# Patient Record
Sex: Male | Born: 1950
Health system: Southern US, Community
[De-identification: ages and names within clinical notes are randomized; demographics above are authoritative.]

## PROBLEM LIST (undated history)

## (undated) DIAGNOSIS — G4733 Obstructive sleep apnea (adult) (pediatric): Secondary | ICD-10-CM

## (undated) DIAGNOSIS — Z9989 Dependence on other enabling machines and devices: Secondary | ICD-10-CM

## (undated) DIAGNOSIS — I10 Essential (primary) hypertension: Secondary | ICD-10-CM

## (undated) DIAGNOSIS — E119 Type 2 diabetes mellitus without complications: Secondary | ICD-10-CM

## (undated) DIAGNOSIS — I96 Gangrene, not elsewhere classified: Secondary | ICD-10-CM

## (undated) DIAGNOSIS — I739 Peripheral vascular disease, unspecified: Secondary | ICD-10-CM

## (undated) HISTORY — PX: VASECTOMY: SHX75

## (undated) HISTORY — DX: Gangrene, not elsewhere classified: I96

## (undated) HISTORY — PX: COLONOSCOPY: SHX174

## (undated) HISTORY — DX: Essential (primary) hypertension: I10

## (undated) HISTORY — DX: Peripheral vascular disease, unspecified: I73.9

---

## 1998-11-17 HISTORY — PX: INGUINAL HERNIA REPAIR: SUR1180

## 2002-02-13 ENCOUNTER — Ambulatory Visit (HOSPITAL_BASED_OUTPATIENT_CLINIC_OR_DEPARTMENT_OTHER): Admission: RE | Admit: 2002-02-13 | Discharge: 2002-02-13 | Payer: Self-pay | Admitting: Family Medicine

## 2014-08-30 ENCOUNTER — Encounter: Payer: Self-pay | Admitting: Family Medicine

## 2014-10-05 ENCOUNTER — Encounter: Payer: Self-pay | Admitting: Family Medicine

## 2014-10-05 ENCOUNTER — Other Ambulatory Visit: Payer: Self-pay | Admitting: Family Medicine

## 2014-10-05 ENCOUNTER — Ambulatory Visit (INDEPENDENT_AMBULATORY_CARE_PROVIDER_SITE_OTHER): Payer: 59 | Admitting: Family Medicine

## 2014-10-05 VITALS — BP 150/90 | HR 79 | Ht 70.5 in | Wt 192.0 lb

## 2014-10-05 DIAGNOSIS — Z0000 Encounter for general adult medical examination without abnormal findings: Secondary | ICD-10-CM

## 2014-10-05 DIAGNOSIS — Z23 Encounter for immunization: Secondary | ICD-10-CM

## 2014-10-05 DIAGNOSIS — Z1211 Encounter for screening for malignant neoplasm of colon: Secondary | ICD-10-CM

## 2014-10-05 LAB — CBC WITH DIFFERENTIAL/PLATELET
Basophils Absolute: 0.1 10*3/uL (ref 0.0–0.1)
Basophils Relative: 1 % (ref 0–1)
Eosinophils Absolute: 0.2 10*3/uL (ref 0.0–0.7)
Eosinophils Relative: 2 % (ref 0–5)
HCT: 45.4 % (ref 39.0–52.0)
Hemoglobin: 16.5 g/dL (ref 13.0–17.0)
Lymphocytes Relative: 28 % (ref 12–46)
Lymphs Abs: 2.2 10*3/uL (ref 0.7–4.0)
MCH: 32.3 pg (ref 26.0–34.0)
MCHC: 36.3 g/dL — ABNORMAL HIGH (ref 30.0–36.0)
MCV: 88.8 fL (ref 78.0–100.0)
MONO ABS: 0.6 10*3/uL (ref 0.1–1.0)
MPV: 9.3 fL — ABNORMAL LOW (ref 9.4–12.4)
Monocytes Relative: 8 % (ref 3–12)
Neutro Abs: 4.8 10*3/uL (ref 1.7–7.7)
Neutrophils Relative %: 61 % (ref 43–77)
Platelets: 285 10*3/uL (ref 150–400)
RBC: 5.11 MIL/uL (ref 4.22–5.81)
RDW: 13.4 % (ref 11.5–15.5)
WBC: 7.8 10*3/uL (ref 4.0–10.5)

## 2014-10-05 LAB — COMPREHENSIVE METABOLIC PANEL
ALBUMIN: 4.6 g/dL (ref 3.5–5.2)
ALT: 28 U/L (ref 0–53)
AST: 22 U/L (ref 0–37)
Alkaline Phosphatase: 46 U/L (ref 39–117)
BUN: 16 mg/dL (ref 6–23)
CO2: 27 mEq/L (ref 19–32)
CREATININE: 0.86 mg/dL (ref 0.50–1.35)
Calcium: 9.6 mg/dL (ref 8.4–10.5)
Chloride: 104 mEq/L (ref 96–112)
Glucose, Bld: 111 mg/dL — ABNORMAL HIGH (ref 70–99)
POTASSIUM: 4.3 meq/L (ref 3.5–5.3)
Sodium: 140 mEq/L (ref 135–145)
Total Bilirubin: 0.7 mg/dL (ref 0.2–1.2)
Total Protein: 7 g/dL (ref 6.0–8.3)

## 2014-10-05 LAB — POCT URINALYSIS DIPSTICK
Bilirubin, UA: NEGATIVE
Blood, UA: NEGATIVE
Glucose, UA: NEGATIVE
Ketones, UA: NEGATIVE
LEUKOCYTES UA: NEGATIVE
NITRITE UA: NEGATIVE
PH UA: 7
PROTEIN UA: NEGATIVE
Spec Grav, UA: 1.015
Urobilinogen, UA: NEGATIVE

## 2014-10-05 LAB — LIPID PANEL
CHOL/HDL RATIO: 4.2 ratio
Cholesterol: 162 mg/dL (ref 0–200)
HDL: 39 mg/dL — AB (ref >39)
LDL Cholesterol: 108 mg/dL — ABNORMAL HIGH (ref 0–99)
Triglycerides: 77 mg/dL (ref <150)
VLDL: 15 mg/dL (ref 0–40)

## 2014-10-05 NOTE — Progress Notes (Signed)
   Subjective:    Patient ID: Adam [name], male    DOB: [birth year], 63 y.o.   MRN: [id number]  HPI He is here as a new patient for complete examination. In the past he had been followed by Dr. Deatra Ina. He has no particular concerns or complaints. His work and home life are going quite well. Family and social history was reviewed. Presently he is on no medications. His immunizations were also renewed.   Review of Systems  All other systems reviewed and are negative.      Objective:   Physical Exam Pulse 79  Ht 5' 10.5" (1.791 m)  Wt 192 lb (87.091 kg)  BMI 27.15 kg/m2  SpO2 98%  General Appearance:    Alert, cooperative, no distress, appears stated age  Head:    Normocephalic, without obvious abnormality, atraumatic  Eyes:    PERRL, conjunctiva/corneas clear, EOM's intact, fundi    benign  Ears:    Normal TM's and external ear canals  Nose:   Nares normal, mucosa normal, no drainage or sinus   tenderness  Throat:   Lips, mucosa, and tongue normal; teeth and gums normal  Neck:   Supple, no lymphadenopathy;  thyroid:  no   enlargement/tenderness/nodules; no carotid   bruit or JVD  Back:    Spine nontender, no curvature, ROM normal, no CVA     tenderness  Lungs:     Clear to auscultation bilaterally without wheezes, rales or     ronchi; respirations unlabored  Chest Wall:    No tenderness or deformity   Heart:    Regular rate and rhythm, S1 and S2 normal, no murmur, rub   or gallop  Breast Exam:    No chest wall tenderness, masses or gynecomastia  Abdomen:     Soft, non-tender, nondistended, normoactive bowel sounds,    no masses, no hepatosplenomegaly        Extremities:   No clubbing, cyanosis or edema  Pulses:   2+ and symmetric all extremities  Skin:   Skin color, texture, turgor normal, no rashes or lesions  Lymph nodes:   Cervical, supraclavicular, and axillary nodes normal  Neurologic:   CNII-XII intact, normal strength, sensation and gait; reflexes 2+ and symmetric  throughout          Psych:   Normal mood, affect, hygiene and grooming.          Assessment & Plan:  Routine general medical examination at a health care facility - Plan: CBC with Differential, Comprehensive metabolic panel, Lipid panel, POCT Urinalysis Dipstick  Special screening for malignant neoplasms, colon - Plan: Ambulatory referral to Gastroenterology  Need for Tdap vaccination - Plan: Tdap vaccine greater than or equal to 7yo IM  Need for Zostavax administration - Plan: Varicella-zoster vaccine subcutaneous  encouraged him to continue to take good care of himself. At a multivitamin to his regimen. His immunizations will be updated. Discussed flu shot but at this time he is going to hold off on that.

## 2014-10-06 LAB — HEMOGLOBIN A1C
Hgb A1c MFr Bld: 5.9 % — ABNORMAL HIGH (ref <5.7)
Mean Plasma Glucose: 123 mg/dL — ABNORMAL HIGH (ref <117)

## 2014-12-04 ENCOUNTER — Ambulatory Visit (INDEPENDENT_AMBULATORY_CARE_PROVIDER_SITE_OTHER): Payer: PRIVATE HEALTH INSURANCE | Admitting: Family Medicine

## 2014-12-04 VITALS — BP 200/110 | HR 66

## 2014-12-04 DIAGNOSIS — I10 Essential (primary) hypertension: Secondary | ICD-10-CM

## 2014-12-04 LAB — HM COLONOSCOPY

## 2014-12-04 MED ORDER — LISINOPRIL-HYDROCHLOROTHIAZIDE 10-12.5 MG PO TABS
1.0000 | ORAL_TABLET | Freq: Every day | ORAL | Status: DC
Start: 2014-12-04 — End: 2015-01-08

## 2014-12-04 NOTE — Patient Instructions (Signed)
If you develop a cough or have any swelling, let me know and stop the medicine

## 2014-12-04 NOTE — Progress Notes (Signed)
   Subjective:    Patient ID: Adam [name], male    DOB: 10-17-1951, 64 y.o.   MRN: [id number]  HPI He is here for consultation about his blood pressure. He was seen earlier today by Dr. Collene Mares and noted to have elevated blood pressure. He was sent here for further consult. Review his record indicates his last blood pressure was slightly elevated. He has no previous history of difficulty with it.   Review of Systems     Objective:   Physical Exam Alert and in no distress. Blood pressure is recorded.       Assessment & Plan:  Essential hypertension - Plan: lisinopril-hydrochlorothiazide (PRINZIDE,ZESTORETIC) 10-12.5 MG per tablet  he now has had 2 separate blood pressure readings that are elevated. I will place him on lisinopril/HCTZ. Discussed possible side effects of this medication. Also discussed the long-term benefit of antihypertensive medication reducing the risk of stroke, heart failure and renal failure. Recheck here in one month.

## 2015-01-08 ENCOUNTER — Ambulatory Visit (INDEPENDENT_AMBULATORY_CARE_PROVIDER_SITE_OTHER): Payer: PRIVATE HEALTH INSURANCE | Admitting: Family Medicine

## 2015-01-08 ENCOUNTER — Encounter: Payer: Self-pay | Admitting: Family Medicine

## 2015-01-08 VITALS — BP 150/100 | HR 68 | Ht 70.0 in | Wt 193.0 lb

## 2015-01-08 DIAGNOSIS — I10 Essential (primary) hypertension: Secondary | ICD-10-CM

## 2015-01-08 MED ORDER — LISINOPRIL-HYDROCHLOROTHIAZIDE 20-12.5 MG PO TABS
1.0000 | ORAL_TABLET | Freq: Every day | ORAL | Status: DC
Start: 2015-01-08 — End: 2015-03-04

## 2015-01-08 NOTE — Progress Notes (Signed)
   Subjective:    Patient ID: Adam [name], male    DOB: 11/18/1950, 64 y.o.   MRN: [id number]  HPI He is here for a recheck on his blood pressure. He is now taking lisinopril/HCTZ and having no difficulty with this. He is checking his blood at home. He did not bring those readings in.   Review of Systems     Objective:   Physical Exam Alert and in no distress otherwise not examined. Blood pressure is recorded.       Assessment & Plan:  Essential hypertension - Plan: lisinopril-hydrochlorothiazide (ZESTORETIC) 20-12.5 MG per tablet  I will increase his lisinopril. Discussed the fact that we might need to go with another medication if this does not lower his pressure. Also discussed increasing his physical activities. He is to bring in his blood pressure cuff and the readings to compare with ours.

## 2015-01-08 NOTE — Patient Instructions (Signed)
Bring her blood pressure cuff in with the readings and check your pressure in the sitting position and resting.

## 2015-02-05 ENCOUNTER — Ambulatory Visit: Payer: PRIVATE HEALTH INSURANCE | Admitting: Family Medicine

## 2015-03-04 ENCOUNTER — Other Ambulatory Visit: Payer: Self-pay | Admitting: Family Medicine

## 2015-04-24 ENCOUNTER — Ambulatory Visit (INDEPENDENT_AMBULATORY_CARE_PROVIDER_SITE_OTHER): Payer: PRIVATE HEALTH INSURANCE | Admitting: Family Medicine

## 2015-04-24 ENCOUNTER — Encounter: Payer: Self-pay | Admitting: Family Medicine

## 2015-04-24 VITALS — BP 140/84 | HR 73 | Wt 188.2 lb

## 2015-04-24 DIAGNOSIS — L989 Disorder of the skin and subcutaneous tissue, unspecified: Secondary | ICD-10-CM | POA: Diagnosis not present

## 2015-04-24 DIAGNOSIS — I10 Essential (primary) hypertension: Secondary | ICD-10-CM | POA: Diagnosis not present

## 2015-04-24 DIAGNOSIS — G4733 Obstructive sleep apnea (adult) (pediatric): Secondary | ICD-10-CM | POA: Diagnosis not present

## 2015-04-24 DIAGNOSIS — Z9989 Dependence on other enabling machines and devices: Principal | ICD-10-CM

## 2015-04-24 MED ORDER — LISINOPRIL-HYDROCHLOROTHIAZIDE 20-12.5 MG PO TABS: 1.0000 | ORAL_TABLET | Freq: Every day | ORAL | Status: DC

## 2015-04-24 NOTE — Progress Notes (Signed)
   Subjective:    Patient ID: Adam [name], male    DOB: [birth year], 64 y.o.   MRN: [id number]  HPI He is here for recheck. He did not go to the higher dosing of lisinopril. He has continued on the 10/12.5. He is here for recheck on that. He also has an underlying history of OSA and has been on CPAP for approximately 10 years.He also has a skin tag present on the left lateral upper eyelid as well as a lesion on his scalp on the right.   Review of Systems     Objective:   Physical Exam Alert and in no distress. Blood pressure is recorded.Skin tag noted. The lesion on the right is well demarcated and not pigmented.       Assessment & Plan:  OSA on CPAP  Essential hypertension - Plan: lisinopril-hydrochlorothiazide (PRINZIDE,ZESTORETIC) 20-12.5 MG per tablet  Scalp lesion - Plan: Ambulatory referral to Dermatology I explained that I would like his blood pressure a little bit lower and recommend he go to the 20/12.5. We'll also refer to dermatology to look at the scalp lesion and to remove the skin tag. Explained that I did not want to take off the skin tag since it is so close to the eye.

## 2015-11-22 ENCOUNTER — Encounter: Payer: Self-pay | Admitting: Family Medicine

## 2015-11-22 ENCOUNTER — Ambulatory Visit (INDEPENDENT_AMBULATORY_CARE_PROVIDER_SITE_OTHER): Payer: PRIVATE HEALTH INSURANCE | Admitting: Family Medicine

## 2015-11-22 ENCOUNTER — Other Ambulatory Visit: Payer: Self-pay

## 2015-11-22 VITALS — BP 162/92 | HR 70 | Ht 70.0 in | Wt 194.4 lb

## 2015-11-22 DIAGNOSIS — I96 Gangrene, not elsewhere classified: Secondary | ICD-10-CM

## 2015-11-22 DIAGNOSIS — M79674 Pain in right toe(s): Secondary | ICD-10-CM | POA: Diagnosis not present

## 2015-11-22 DIAGNOSIS — R0989 Other specified symptoms and signs involving the circulatory and respiratory systems: Secondary | ICD-10-CM

## 2015-11-22 DIAGNOSIS — E119 Type 2 diabetes mellitus without complications: Secondary | ICD-10-CM | POA: Diagnosis not present

## 2015-11-22 HISTORY — DX: Gangrene, not elsewhere classified: I96

## 2015-11-22 LAB — CBC WITH DIFFERENTIAL/PLATELET
BASOS ABS: 0.1 10*3/uL (ref 0.0–0.1)
BASOS PCT: 1 % (ref 0–1)
Eosinophils Absolute: 0.3 10*3/uL (ref 0.0–0.7)
Eosinophils Relative: 3 % (ref 0–5)
HCT: 44.9 % (ref 39.0–52.0)
HEMOGLOBIN: 15.5 g/dL (ref 13.0–17.0)
Lymphocytes Relative: 26 % (ref 12–46)
Lymphs Abs: 2.3 10*3/uL (ref 0.7–4.0)
MCH: 31.3 pg (ref 26.0–34.0)
MCHC: 34.5 g/dL (ref 30.0–36.0)
MCV: 90.7 fL (ref 78.0–100.0)
MPV: 9.4 fL (ref 8.6–12.4)
Monocytes Absolute: 0.8 10*3/uL (ref 0.1–1.0)
Monocytes Relative: 9 % (ref 3–12)
NEUTROS ABS: 5.3 10*3/uL (ref 1.7–7.7)
NEUTROS PCT: 61 % (ref 43–77)
Platelets: 289 10*3/uL (ref 150–400)
RBC: 4.95 MIL/uL (ref 4.22–5.81)
RDW: 14 % (ref 11.5–15.5)
WBC: 8.7 10*3/uL (ref 4.0–10.5)

## 2015-11-22 LAB — POCT GLYCOSYLATED HEMOGLOBIN (HGB A1C): HEMOGLOBIN A1C: 7.1

## 2015-11-22 MED ORDER — TRAMADOL HCL 50 MG PO TABS
50.0000 mg | ORAL_TABLET | Freq: Three times a day (TID) | ORAL | Status: DC | PRN
Start: 2015-11-22 — End: 2015-12-29

## 2015-11-22 NOTE — Patient Instructions (Signed)
Take two Aleve twice a day, then use the Tramadol for pain

## 2015-11-22 NOTE — Progress Notes (Signed)
   Subjective:    Patient ID: Adam [name], male    DOB: 12/19/1950, 65 y.o.   MRN: [id number]  HPI  He is here for evaluation of a one-month month history of difficulty with right great toe pain and swelling. In the last week these symptoms have gotten much worse. He has no history of trauma. He has had no recent procedures. Review of record indicates an elevated A1c of 5.9.   Review of Systems     Objective:   Physical Exam  exam of the right great toe does show the nail has sent from the bed. The toe is quite purple. There is fairly good capillary refill however pulses were not palpable. It is tender to palpation. No joint tenderness.  hemoglobin A1c is now 7.1      Assessment & Plan:  New onset type 2 diabetes mellitus (Adam [name]) - Plan: POCT glycosylated hemoglobin (Hb A1C), CBC with Differential/Platelet, Comprehensive metabolic panel, Lipid panel  Great toe pain, right - Plan: traMADol (ULTRAM) 50 MG tablet  I have concerns over infection of the toe versus vascular compromise. He does now have a new onset diabetes diagnosis and will return here in one week for follow-up on this. I personally set him up for a visit with Dr. Sharol Given to further evaluate and treat his great toe. Case was also discussed with Dr. Tomi Bamberger.

## 2015-11-23 ENCOUNTER — Encounter: Payer: Self-pay | Admitting: Vascular Surgery

## 2015-11-23 ENCOUNTER — Ambulatory Visit (INDEPENDENT_AMBULATORY_CARE_PROVIDER_SITE_OTHER)
Admission: RE | Admit: 2015-11-23 | Discharge: 2015-11-23 | Disposition: A | Discharge status: Home / Self Care | Payer: No Typology Code available for payment source | Source: Ambulatory Visit | Attending: Vascular Surgery | Admitting: Vascular Surgery

## 2015-11-23 ENCOUNTER — Other Ambulatory Visit: Payer: Self-pay

## 2015-11-23 ENCOUNTER — Other Ambulatory Visit: Payer: Self-pay | Admitting: Vascular Surgery

## 2015-11-23 ENCOUNTER — Ambulatory Visit (HOSPITAL_COMMUNITY)
Admission: RE | Admit: 2015-11-23 | Discharge: 2015-11-23 | Disposition: A | Discharge status: Home / Self Care | Payer: No Typology Code available for payment source | Source: Ambulatory Visit | Attending: Vascular Surgery | Admitting: Vascular Surgery

## 2015-11-23 ENCOUNTER — Ambulatory Visit (INDEPENDENT_AMBULATORY_CARE_PROVIDER_SITE_OTHER): Payer: No Typology Code available for payment source | Admitting: Vascular Surgery

## 2015-11-23 VITALS — BP 210/91 | HR 89 | Temp 98.9°F | Resp 16 | Ht 71.0 in | Wt 194.0 lb

## 2015-11-23 DIAGNOSIS — E119 Type 2 diabetes mellitus without complications: Secondary | ICD-10-CM | POA: Insufficient documentation

## 2015-11-23 DIAGNOSIS — I70261 Atherosclerosis of native arteries of extremities with gangrene, right leg: Secondary | ICD-10-CM | POA: Diagnosis not present

## 2015-11-23 DIAGNOSIS — I96 Gangrene, not elsewhere classified: Secondary | ICD-10-CM

## 2015-11-23 DIAGNOSIS — I771 Stricture of artery: Secondary | ICD-10-CM | POA: Insufficient documentation

## 2015-11-23 DIAGNOSIS — I10 Essential (primary) hypertension: Secondary | ICD-10-CM | POA: Insufficient documentation

## 2015-11-23 DIAGNOSIS — I70269 Atherosclerosis of native arteries of extremities with gangrene, unspecified extremity: Secondary | ICD-10-CM | POA: Insufficient documentation

## 2015-11-23 DIAGNOSIS — R0989 Other specified symptoms and signs involving the circulatory and respiratory systems: Secondary | ICD-10-CM

## 2015-11-23 LAB — COMPREHENSIVE METABOLIC PANEL
ALT: 25 U/L (ref 9–46)
AST: 22 U/L (ref 10–35)
Albumin: 4.6 g/dL (ref 3.6–5.1)
Alkaline Phosphatase: 41 U/L (ref 40–115)
BUN: 22 mg/dL (ref 7–25)
CO2: 27 mmol/L (ref 20–31)
Calcium: 10.1 mg/dL (ref 8.6–10.3)
Chloride: 103 mmol/L (ref 98–110)
Creat: 0.82 mg/dL (ref 0.70–1.25)
GLUCOSE: 108 mg/dL — AB (ref 65–99)
POTASSIUM: 4.3 mmol/L (ref 3.5–5.3)
SODIUM: 140 mmol/L (ref 135–146)
TOTAL PROTEIN: 7.1 g/dL (ref 6.1–8.1)
Total Bilirubin: 0.5 mg/dL (ref 0.2–1.2)

## 2015-11-23 LAB — LIPID PANEL
CHOL/HDL RATIO: 3 ratio (ref <=5.0)
Cholesterol: 152 mg/dL (ref 125–200)
HDL: 51 mg/dL (ref >=40)
LDL Cholesterol: 90 mg/dL (ref <130)
Triglycerides: 55 mg/dL (ref <150)
VLDL: 11 mg/dL (ref <30)

## 2015-11-23 MED ORDER — OXYCODONE-ACETAMINOPHEN 5-325 MG PO TABS: 1.0000 | ORAL_TABLET | Freq: Four times a day (QID) | ORAL | Status: DC | PRN

## 2015-11-23 NOTE — Progress Notes (Signed)
Referred by:  Denita Lung, MD [address] [place], [place] [address]  Reason for referral: Right great toe ischemia  History of Present Illness  Adam [name] is a 65 y.o. (05-17-1951) male who presents with chief complaint: painful black right great toe.  Onset of symptom occurred over last 3 weeks, increasing pain in R great toe with darkening color.  The was seen by Dr. Sharol Given yesterday and he had concerns that he had an ischemic toe due PAD.   Pain is described as achy, severity 10/10, and associated with rest and movement.  Patient has attempted to treat this pain with Tramadol without relief.  The patient has intermittentrest pain symptoms.  He denies any significant intermittent claudication.  He denies any drainage or fever or chills.  Atherosclerotic risk factors include: DM, HTN, and prior smoking.  Past Medical History  Diagnosis Date  . Diabetes mellitus without complication ([place])   . Hypertension   . Peripheral vascular disease ([place])   . Gangrene ([place]) 11/22/2015    right great toe    Past Surgical History  Procedure Laterality Date  . Hernia repair  2000    Social History   Social History  . Marital Status: Married    Spouse Name: N/A  . Number of Children: N/A  . Years of Education: N/A   Occupational History  . Not on file.   Social History Main Topics  . Smoking status: Former Smoker    Quit date: 10/06/2011  . Smokeless tobacco: Never Used  . Alcohol Use: 4.2 oz/week    7 Glasses of wine, 0 Standard drinks or equivalent per week  . Drug Use: No  . Sexual Activity: Yes   Other Topics Concern  . Not on file   Social History Narrative    Family History  Problem Relation Age of Onset  . Hypertension Mother     Current Outpatient Prescriptions  Medication Sig Dispense Refill  . lisinopril-hydrochlorothiazide (PRINZIDE,ZESTORETIC) 20-12.5 MG per tablet Take 1 tablet by mouth daily. 90 tablet 3  . traMADol (ULTRAM) 50 MG tablet Take 1  tablet (50 mg total) by mouth every 8 (eight) hours as needed. 30 tablet 0  . oxyCODONE-acetaminophen (PERCOCET/ROXICET) 5-325 MG tablet Take 1-2 tablets by mouth every 6 (six) hours as needed for moderate pain. 30 tablet 0   No current facility-administered medications for this visit.    No Known Allergies   REVIEW OF SYSTEMS:  (Positives checked otherwise negative)  CARDIOVASCULAR:   [ ]  chest pain,  [ ]  chest pressure,  [ ]  palpitations,  [ ]  shortness of breath when laying flat,  [ ]  shortness of breath with exertion,   [x]  pain in feet when walking,  [x]  pain in feet when laying flat, [ ]  history of blood clot in veins (DVT),  [ ]  history of phlebitis,  [ ]  swelling in legs,  [ ]  varicose veins  PULMONARY:   [ ]  productive cough,  [ ]  asthma,  [ ]  wheezing  NEUROLOGIC:   [ ]  weakness in arms or legs,  [ ]  numbness in arms or legs,  [ ]  difficulty speaking or slurred speech,  [ ]  temporary loss of vision in one eye,  [ ]  dizziness  HEMATOLOGIC:   [ ]  bleeding problems,  [ ]  problems with blood clotting too easily  MUSCULOSKEL:   [ ]  joint pain, [ ]  joint swelling  GASTROINTEST:   [ ]  vomiting blood,  [ ]   blood in stool     GENITOURINARY:   [ ]  burning with urination,  [ ]  blood in urine  PSYCHIATRIC:   [ ]  history of major depression  INTEGUMENTARY:   [ ]  rashes,  [ ]  ulcers  CONSTITUTIONAL:   [ ]  fever,  [ ]  chills   For VQI Use Only  PRE-ADM LIVING: Home  AMB STATUS: Ambulatory  CAD Sx: None  PRIOR CHF: None  STRESS TEST: [x]  No, [ ]  Normal, [ ]  + ischemia, [ ]  + MI, [ ]  Both   Physical Examination  Filed Vitals:   11/23/15 1613 11/23/15 1619  BP: 199/82 210/91  Pulse: 75 89  Temp: 98.9 F (37.2 C)   TempSrc: Oral   Resp: 16   Height: 5\' 11"  (1.803 m)   Weight: 194 lb (87.998 kg)   SpO2: 98%    Body mass index is 27.07 kg/(m^2).  General: A&O x 3, WDWN  Head: [place]/AT  Ear/Nose/Throat: Hearing grossly intact, nares  w/o erythema or drainage, oropharynx w/o Erythema/Exudate, Mallampati score: 3  Eyes: PERRLA, EOMI  Neck: Supple, no nuchal rigidity, no palpable LAD  Pulmonary: Sym exp, good air movt, CTAB, no rales, rhonchi, & wheezing  Cardiac: RRR, Nl S1, S2, no Murmurs, rubs or gallops  Vascular: Vessel Right Left  Radial Palpable Palpable  Brachial Palpable Palpable  Carotid Palpable, without bruit Palpable, without bruit  Aorta Not palpable N/A  Femoral Palpable Palpable  Popliteal Not palpable Not palpable  PT Not Palpable Not Palpable  DP Not Palpable Not Palpable   Gastrointestinal: soft, NTND, -G/R, - HSM, - masses, - CVAT B  Musculoskeletal: M/S 5/5 throughout , Extremities without ischemic changes except  R great toe appears ischemic with black tip  Neurologic: CN 2-12 intact , Pain and light touch intact in extremities including R great toe, Motor exam as listed above  Psychiatric: Judgment intact, Mood & affect appropriate for pt's clinical situation  Dermatologic: See M/S exam for extremity exam, no rashes otherwise noted  Lymph : No Cervical, Axillary, or Inguinal lymphadenopathy    Non-Invasive Vascular Imaging  ABI (Date: 11/23/2015)  R: 0.49, AT: mono, PT: mono, TBI: not obtained  L: 0.71, DP: mono, PT: mono, TBI: 0.38  RLE arterial duplex (11/23/2015)  Triphasic flow in CFA and PFA  Occluded SFA  Monophasic flow in Pop and tibial arteries  Outside Studies/Documentation 2 pages of outside documents were reviewed including: outpatient ortho chart.   Medical Decision Making  Adam [name] is a 65 y.o. male who presents with: RLE critical limb ischemia, LLE moderate PAD   I discussed with the patient the natural history of critical limb ischemia: 25% require amputation in one year, 50% are able to maintain their limbs in one year, and 25-30% die in one year due to comorbidities.  Given the limb threatening status of this patient, I recommend an aggressive  work up including proceeding with an: Aortogram, Bilateral runoff and possible intervention RLE I discussed with the patient the nature of angiographic procedures, especially the limited patencies of any endovascular intervention. The patient is aware of that the risks of an angiographic procedure include but are not limited to: bleeding, infection, access site complications, embolization, rupture of treated vessel, dissection, possible need for emergent surgical intervention, and possible need for surgical procedures to treat the patient's pathology. The patient is aware of the risks and agrees to proceed.  The procedure is scheduled for: 9 JAN 17.  I discussed in depth  with the patient the nature of atherosclerosis, and emphasized the importance of maximal medical management including strict control of blood pressure, blood glucose, and lipid levels, antiplatelet agents, obtaining regular exercise, and cessation of smoking.  The patient is aware that without maximal medical management the underlying atherosclerotic disease process will progress, limiting the benefit of any interventions. The patient is currently not on a statin.  The patient will have a lipid panel drawn to evaluate prior to any surgical intervention. The patient is currently on an anti-platelet.  The patient will be started on ASA 81 mg PO daily after his angiographic procedure. I suspect this patient is not going to be an endovascular candidate as a flush SFA occlusion is seen on duplex.  He will likely need ASAP surgical revascularization and R great toe amputation.  Thank you for allowing [place] to participate in this patient's care.   Adele Barthel, MD [facility] Office: [phone] Pager: [phone]  11/23/2015, 4:38 PM

## 2015-11-26 ENCOUNTER — Telehealth: Payer: Self-pay | Admitting: Family Medicine

## 2015-11-26 ENCOUNTER — Encounter (HOSPITAL_COMMUNITY)
Admission: AD | Disposition: A | Discharge status: Home / Self Care | Payer: Self-pay | Source: Ambulatory Visit | Attending: Vascular Surgery

## 2015-11-26 ENCOUNTER — Ambulatory Visit (HOSPITAL_BASED_OUTPATIENT_CLINIC_OR_DEPARTMENT_OTHER)
Admission: AD | Admit: 2015-11-26 | Discharge: 2015-11-26 | Disposition: A | Discharge status: Home / Self Care | Payer: Medicare Other | Source: Ambulatory Visit | Attending: Vascular Surgery | Admitting: Vascular Surgery

## 2015-11-26 ENCOUNTER — Other Ambulatory Visit: Payer: Self-pay

## 2015-11-26 ENCOUNTER — Ambulatory Visit (HOSPITAL_COMMUNITY)
Admission: AD | Admit: 2015-11-26 | Discharge: 2015-11-26 | Disposition: A | Discharge status: Home / Self Care | Payer: Medicare Other | Source: Ambulatory Visit | Attending: Vascular Surgery | Admitting: Vascular Surgery

## 2015-11-26 DIAGNOSIS — Z87891 Personal history of nicotine dependence: Secondary | ICD-10-CM | POA: Insufficient documentation

## 2015-11-26 DIAGNOSIS — L97519 Non-pressure chronic ulcer of other part of right foot with unspecified severity: Secondary | ICD-10-CM | POA: Diagnosis present

## 2015-11-26 DIAGNOSIS — I8001 Phlebitis and thrombophlebitis of superficial vessels of right lower extremity: Secondary | ICD-10-CM | POA: Diagnosis present

## 2015-11-26 DIAGNOSIS — I70269 Atherosclerosis of native arteries of extremities with gangrene, unspecified extremity: Secondary | ICD-10-CM

## 2015-11-26 DIAGNOSIS — I70261 Atherosclerosis of native arteries of extremities with gangrene, right leg: Principal | ICD-10-CM | POA: Diagnosis present

## 2015-11-26 DIAGNOSIS — I10 Essential (primary) hypertension: Secondary | ICD-10-CM | POA: Diagnosis present

## 2015-11-26 DIAGNOSIS — Z7982 Long term (current) use of aspirin: Secondary | ICD-10-CM

## 2015-11-26 DIAGNOSIS — I7025 Atherosclerosis of native arteries of other extremities with ulceration: Secondary | ICD-10-CM

## 2015-11-26 DIAGNOSIS — I701 Atherosclerosis of renal artery: Secondary | ICD-10-CM | POA: Diagnosis present

## 2015-11-26 DIAGNOSIS — I743 Embolism and thrombosis of arteries of the lower extremities: Secondary | ICD-10-CM | POA: Diagnosis present

## 2015-11-26 DIAGNOSIS — Z8249 Family history of ischemic heart disease and other diseases of the circulatory system: Secondary | ICD-10-CM | POA: Insufficient documentation

## 2015-11-26 DIAGNOSIS — E1152 Type 2 diabetes mellitus with diabetic peripheral angiopathy with gangrene: Secondary | ICD-10-CM | POA: Diagnosis present

## 2015-11-26 DIAGNOSIS — Z79899 Other long term (current) drug therapy: Secondary | ICD-10-CM

## 2015-11-26 DIAGNOSIS — I70235 Atherosclerosis of native arteries of right leg with ulceration of other part of foot: Secondary | ICD-10-CM | POA: Diagnosis not present

## 2015-11-26 HISTORY — PX: PERIPHERAL VASCULAR CATHETERIZATION: SHX172C

## 2015-11-26 LAB — CBC
HCT: 45.2 % (ref 39.0–52.0)
Hemoglobin: 15.2 g/dL (ref 13.0–17.0)
MCH: 31.3 pg (ref 26.0–34.0)
MCHC: 33.6 g/dL (ref 30.0–36.0)
MCV: 93.2 fL (ref 78.0–100.0)
PLATELETS: 239 10*3/uL (ref 150–400)
RBC: 4.85 MIL/uL (ref 4.22–5.81)
RDW: 12.9 % (ref 11.5–15.5)
WBC: 8.5 10*3/uL (ref 4.0–10.5)

## 2015-11-26 LAB — COMPREHENSIVE METABOLIC PANEL
ALBUMIN: 3.7 g/dL (ref 3.5–5.0)
ALT: 18 U/L (ref 17–63)
AST: 25 U/L (ref 15–41)
Alkaline Phosphatase: 36 U/L — ABNORMAL LOW (ref 38–126)
Anion gap: 10 (ref 5–15)
BUN: 20 mg/dL (ref 6–20)
CHLORIDE: 105 mmol/L (ref 101–111)
CO2: 25 mmol/L (ref 22–32)
CREATININE: 0.8 mg/dL (ref 0.61–1.24)
Calcium: 9.3 mg/dL (ref 8.9–10.3)
GFR calc Af Amer: >60 mL/min (ref >60)
GLUCOSE: 119 mg/dL — AB (ref 65–99)
Potassium: 3.9 mmol/L (ref 3.5–5.1)
SODIUM: 140 mmol/L (ref 135–145)
Total Bilirubin: 0.9 mg/dL (ref 0.3–1.2)
Total Protein: 6 g/dL — ABNORMAL LOW (ref 6.5–8.1)

## 2015-11-26 LAB — TYPE AND SCREEN
ABO/RH(D): A POS
Antibody Screen: NEGATIVE

## 2015-11-26 LAB — POCT I-STAT, CHEM 8
BUN: 28 mg/dL — AB (ref 6–20)
CREATININE: 0.9 mg/dL (ref 0.61–1.24)
Calcium, Ion: 1.21 mmol/L (ref 1.13–1.30)
Chloride: 100 mmol/L — ABNORMAL LOW (ref 101–111)
Glucose, Bld: 118 mg/dL — ABNORMAL HIGH (ref 65–99)
HEMATOCRIT: 48 % (ref 39.0–52.0)
Hemoglobin: 16.3 g/dL (ref 13.0–17.0)
Potassium: 3.7 mmol/L (ref 3.5–5.1)
Sodium: 140 mmol/L (ref 135–145)
TCO2: 30 mmol/L (ref 0–100)

## 2015-11-26 LAB — URINE MICROSCOPIC-ADD ON: WBC UA: NONE SEEN WBC/hpf (ref 0–5)

## 2015-11-26 LAB — URINALYSIS, ROUTINE W REFLEX MICROSCOPIC
BILIRUBIN URINE: NEGATIVE
Glucose, UA: NEGATIVE mg/dL
KETONES UR: 15 mg/dL — AB
LEUKOCYTES UA: NEGATIVE
NITRITE: NEGATIVE
PROTEIN: NEGATIVE mg/dL
Specific Gravity, Urine: >1.046 — ABNORMAL HIGH (ref 1.005–1.030)
pH: 7.5 (ref 5.0–8.0)

## 2015-11-26 LAB — PROTIME-INR
INR: 1.13 (ref 0.00–1.49)
Prothrombin Time: 14.7 seconds (ref 11.6–15.2)

## 2015-11-26 LAB — ABO/RH: ABO/RH(D): A POS

## 2015-11-26 LAB — APTT: aPTT: 27 seconds (ref 24–37)

## 2015-11-26 SURGERY — ABDOMINAL AORTOGRAM
Anesthesia: LOCAL

## 2015-11-26 MED ORDER — OXYCODONE-ACETAMINOPHEN 5-325 MG PO TABS
ORAL_TABLET | ORAL | Status: AC
Start: 2015-11-26 — End: 2015-11-26
  Administered 2015-11-26: 2 via ORAL
  Filled 2015-11-26: qty 2

## 2015-11-26 MED ORDER — IODIXANOL 320 MG/ML IV SOLN
INTRAVENOUS | Status: DC | PRN
  Administered 2015-11-26: 143 mL via INTRA_ARTERIAL

## 2015-11-26 MED ORDER — OXYCODONE-ACETAMINOPHEN 5-325 MG PO TABS
1.0000 | ORAL_TABLET | ORAL | Status: DC | PRN
  Administered 2015-11-26: 2 via ORAL

## 2015-11-26 MED ORDER — SODIUM CHLORIDE 0.9 % IV SOLN: 1.0000 mL/kg/h | INTRAVENOUS | Status: DC

## 2015-11-26 MED ORDER — LIDOCAINE HCL (PF) 1 % IJ SOLN
INTRAMUSCULAR | Status: DC | PRN
  Administered 2015-11-26: 08:00:00

## 2015-11-26 MED ORDER — HYDRALAZINE HCL 20 MG/ML IJ SOLN
INTRAMUSCULAR | Status: AC
  Filled 2015-11-26: qty 1

## 2015-11-26 MED ORDER — FENTANYL CITRATE (PF) 100 MCG/2ML IJ SOLN
INTRAMUSCULAR | Status: DC | PRN
  Administered 2015-11-26: 50 ug via INTRAVENOUS

## 2015-11-26 MED ORDER — HYDRALAZINE HCL 20 MG/ML IJ SOLN
INTRAMUSCULAR | Status: DC | PRN
  Administered 2015-11-26: 10 mg via INTRAVENOUS

## 2015-11-26 MED ORDER — MIDAZOLAM HCL 2 MG/2ML IJ SOLN
INTRAMUSCULAR | Status: DC | PRN
  Administered 2015-11-26: 1 mg via INTRAVENOUS

## 2015-11-26 MED ORDER — SODIUM CHLORIDE 0.9 % IV SOLN
INTRAVENOUS | Status: DC
  Administered 2015-11-26: 07:00:00 via INTRAVENOUS

## 2015-11-26 MED ORDER — FENTANYL CITRATE (PF) 100 MCG/2ML IJ SOLN
INTRAMUSCULAR | Status: AC
  Filled 2015-11-26: qty 2

## 2015-11-26 MED ORDER — MIDAZOLAM HCL 2 MG/2ML IJ SOLN
INTRAMUSCULAR | Status: AC
  Filled 2015-11-26: qty 2

## 2015-11-26 MED ORDER — HEPARIN (PORCINE) IN NACL 2-0.9 UNIT/ML-% IJ SOLN
INTRAMUSCULAR | Status: AC
  Filled 2015-11-26: qty 1000

## 2015-11-26 MED ORDER — LIDOCAINE HCL (PF) 1 % IJ SOLN
INTRAMUSCULAR | Status: AC
  Filled 2015-11-26: qty 30

## 2015-11-26 SURGICAL SUPPLY — 13 items
CATH ANGIO 5F PIGTAIL 65CM (CATHETERS) ×1 IMPLANT
CATH CROSS OVER TEMPO 5F (CATHETERS) ×1 IMPLANT
CATH STRAIGHT 5FR 65CM (CATHETERS) ×1 IMPLANT
COVER PRB 48X5XTLSCP FOLD TPE (BAG) IMPLANT
COVER PROBE 5X48 (BAG) ×2
GUIDEWIRE ANGLED .035X150CM (WIRE) ×1 IMPLANT
KIT MICROINTRODUCER STIFF 5F (SHEATH) ×1 IMPLANT
KIT PV (KITS) ×2 IMPLANT
SHEATH PINNACLE 5F 10CM (SHEATH) ×1 IMPLANT
SYR MEDRAD MARK V 150ML (SYRINGE) ×2 IMPLANT
TRANSDUCER W/STOPCOCK (MISCELLANEOUS) ×2 IMPLANT
TRAY PV CATH (CUSTOM PROCEDURE TRAY) ×2 IMPLANT
WIRE HITORQ VERSACORE ST 145CM (WIRE) ×1 IMPLANT

## 2015-11-26 NOTE — Interval H&P Note (Signed)
History and Physical Interval Note:  11/26/2015 7:37 AM  Adam [name]  has presented today for surgery, with the diagnosis of pvd with right great toe ulcer  The various methods of treatment have been discussed with the patient and family. After consideration of risks, benefits and other options for treatment, the patient has consented to  Procedure(s): Abdominal Aortogram (N/A) as a surgical intervention .  The patient's history has been reviewed, patient examined, no change in status, stable for surgery.  I have reviewed the patient's chart and labs.  Questions were answered to the patient's satisfaction.     Deitra Mayo

## 2015-11-26 NOTE — Op Note (Signed)
   PATIENT: Adam [name]   MRN: [id number] DOB: 1951/06/04    DATE OF PROCEDURE: 11/26/2015  INDICATIONS: OREN HERINGTON is a 65 y.o. male the presented with a nonhealing wound of his right great toe. He had evidence of infrainguinal arterial occlusive disease on exam and presents for arteriography.  PROCEDURE:  1. Ultrasound-guided access to the left common femoral artery 2. Aortogram with bilateral iliac arteriogram 3. Selective catheterization of the right external iliac artery with right lower extremity runoff 4. Retrograde left femoral arteriogram with left lower extremity runoff  SURGEON: Judeth Cornfield. Scot Dock, MD, FACS  ANESTHESIA: Gen.   EBL: minimal  TECHNIQUE: The patient was taken to the peripheral vascular lab and received 1 mg of Versed and 50 g of fentanyl.  Both groins were prepped and draped in usual sterile fashion. Under ultrasound guidance, after the skin was anesthetized, the left common femoral artery was cannulated with a micropuncture needle and a micropuncture sheath was introduced over the wire. This was exchanged for a 5 French sheath over a versa core wire. The pigtail catheter was positioned at the L1 vertebral body of flush aortogram obtained. The catheter was positioned above the aortic bifurcation and an oblique iliac projection was obtained. Next the catheter was exchanged for a crossover catheter which was positioned into the proximal right common iliac artery. An angled Glidewire was advanced into the external iliac artery and the crossover catheter exchanged for a straight catheter. Selective right external iliac arteriogram was obtained with right lower extremity runoff. A lateral projection of the foot was obtained. Next the straight catheter was removed and a retrograde left femoral arteriogram obtained with left lower extremity runoff. Given the possibility of embolization given the pattern of disease, I advanced the pigtail catheter is high was it reach  into the descending thoracic aorta and a aortogram was obtained and the decision thoracic aorta which did not show any evidence of a potential source for embolization. At the completion of the procedure, the sheath was flushed. The patient was transferred to the holding area for removal of the sheath. No media complications were noted.  FINDINGS:  1. The descending thoracic aorta suprarenal aorta and infrarenal aorta are widely patent. There are single renal arteries bilaterally. There was a fairly smooth 50% left renal artery stenosis. 2. On the right side, the common iliac, external iliac, hypogastric, common femoral, and deep femoral arteries are patent. The superficial femoral artery on the right is occluded at its origin with reconstitution of the palpatory artery at the level of the knee. There is two-vessel runoff on the right via the anterior tibial and peroneal arteries which are abruptly occluded in the distal third of the leg. There is some reconstitution of the right dorsalis pedis artery. The posterior tibial artery on the right is occluded. 3. On the left side, the common iliac, external iliac, hypogastric, common femoral artery, deep femoral artery, and proximal superficial femoral artery are patent. The superficial femoral artery is occluded at the adductor canal with reconstitution of the posterior tibial artery. There is some reconstitution also of a diseased anterior tibial artery which occludes in the mid leg.  Deitra Mayo, MD, FACS [facility]  DATE OF DICTATION:   11/26/2015

## 2015-11-26 NOTE — H&P (View-Only) (Signed)
Referred by:  Denita Lung, MD [address] [place], [place] [address]  Reason for referral: Right great toe ischemia  History of Present Illness  Adam [name] is a 65 y.o. ([birth year]) male who presents with chief complaint: painful black right great toe.  Onset of symptom occurred over last 3 weeks, increasing pain in R great toe with darkening color.  The was seen by Dr. Sharol Given yesterday and he had concerns that he had an ischemic toe due PAD.   Pain is described as achy, severity 10/10, and associated with rest and movement.  Patient has attempted to treat this pain with Tramadol without relief.  The patient has intermittentrest pain symptoms.  He denies any significant intermittent claudication.  He denies any drainage or fever or chills.  Atherosclerotic risk factors include: DM, HTN, and prior smoking.  Past Medical History  Diagnosis Date  . Diabetes mellitus without complication ([place])   . Hypertension   . Peripheral vascular disease ([place])   . Gangrene ([place]) 11/22/2015    right great toe    Past Surgical History  Procedure Laterality Date  . Hernia repair  2000    Social History   Social History  . Marital Status: Married    Spouse Name: N/A  . Number of Children: N/A  . Years of Education: N/A   Occupational History  . Not on file.   Social History Main Topics  . Smoking status: Former Smoker    Quit date: 10/06/2011  . Smokeless tobacco: Never Used  . Alcohol Use: 4.2 oz/week    7 Glasses of wine, 0 Standard drinks or equivalent per week  . Drug Use: No  . Sexual Activity: Yes   Other Topics Concern  . Not on file   Social History Narrative    Family History  Problem Relation Age of Onset  . Hypertension Mother     Current Outpatient Prescriptions  Medication Sig Dispense Refill  . lisinopril-hydrochlorothiazide (PRINZIDE,ZESTORETIC) 20-12.5 MG per tablet Take 1 tablet by mouth daily. 90 tablet 3  . traMADol (ULTRAM) 50 MG tablet Take 1  tablet (50 mg total) by mouth every 8 (eight) hours as needed. 30 tablet 0  . oxyCODONE-acetaminophen (PERCOCET/ROXICET) 5-325 MG tablet Take 1-2 tablets by mouth every 6 (six) hours as needed for moderate pain. 30 tablet 0   No current facility-administered medications for this visit.    No Known Allergies   REVIEW OF SYSTEMS:  (Positives checked otherwise negative)  CARDIOVASCULAR:   [ ]  chest pain,  [ ]  chest pressure,  [ ]  palpitations,  [ ]  shortness of breath when laying flat,  [ ]  shortness of breath with exertion,   [x]  pain in feet when walking,  [x]  pain in feet when laying flat, [ ]  history of blood clot in veins (DVT),  [ ]  history of phlebitis,  [ ]  swelling in legs,  [ ]  varicose veins  PULMONARY:   [ ]  productive cough,  [ ]  asthma,  [ ]  wheezing  NEUROLOGIC:   [ ]  weakness in arms or legs,  [ ]  numbness in arms or legs,  [ ]  difficulty speaking or slurred speech,  [ ]  temporary loss of vision in one eye,  [ ]  dizziness  HEMATOLOGIC:   [ ]  bleeding problems,  [ ]  problems with blood clotting too easily  MUSCULOSKEL:   [ ]  joint pain, [ ]  joint swelling  GASTROINTEST:   [ ]  vomiting blood,  [ ]   blood in stool     GENITOURINARY:   [ ]  burning with urination,  [ ]  blood in urine  PSYCHIATRIC:   [ ]  history of major depression  INTEGUMENTARY:   [ ]  rashes,  [ ]  ulcers  CONSTITUTIONAL:   [ ]  fever,  [ ]  chills   For VQI Use Only  PRE-ADM LIVING: Home  AMB STATUS: Ambulatory  CAD Sx: None  PRIOR CHF: None  STRESS TEST: [x]  No, [ ]  Normal, [ ]  + ischemia, [ ]  + MI, [ ]  Both   Physical Examination  Filed Vitals:   11/23/15 1613 11/23/15 1619  BP: 199/82 210/91  Pulse: 75 89  Temp: 98.9 F (37.2 C)   TempSrc: Oral   Resp: 16   Height: 5\' 11"  (1.803 m)   Weight: 194 lb (87.998 kg)   SpO2: 98%    Body mass index is 27.07 kg/(m^2).  General: A&O x 3, WDWN  Head: [place]/AT  Ear/Nose/Throat: Hearing grossly intact, nares  w/o erythema or drainage, oropharynx w/o Erythema/Exudate, Mallampati score: 3  Eyes: PERRLA, EOMI  Neck: Supple, no nuchal rigidity, no palpable LAD  Pulmonary: Sym exp, good air movt, CTAB, no rales, rhonchi, & wheezing  Cardiac: RRR, Nl S1, S2, no Murmurs, rubs or gallops  Vascular: Vessel Right Left  Radial Palpable Palpable  Brachial Palpable Palpable  Carotid Palpable, without bruit Palpable, without bruit  Aorta Not palpable N/A  Femoral Palpable Palpable  Popliteal Not palpable Not palpable  PT Not Palpable Not Palpable  DP Not Palpable Not Palpable   Gastrointestinal: soft, NTND, -G/R, - HSM, - masses, - CVAT B  Musculoskeletal: M/S 5/5 throughout , Extremities without ischemic changes except  R great toe appears ischemic with black tip  Neurologic: CN 2-12 intact , Pain and light touch intact in extremities including R great toe, Motor exam as listed above  Psychiatric: Judgment intact, Mood & affect appropriate for pt's clinical situation  Dermatologic: See M/S exam for extremity exam, no rashes otherwise noted  Lymph : No Cervical, Axillary, or Inguinal lymphadenopathy    Non-Invasive Vascular Imaging  ABI (Date: 11/23/2015)  R: 0.49, AT: mono, PT: mono, TBI: not obtained  L: 0.71, DP: mono, PT: mono, TBI: 0.38  RLE arterial duplex (11/23/2015)  Triphasic flow in CFA and PFA  Occluded SFA  Monophasic flow in Pop and tibial arteries  Outside Studies/Documentation 2 pages of outside documents were reviewed including: outpatient ortho chart.   Medical Decision Making  Adam [name] is a 65 y.o. male who presents with: RLE critical limb ischemia, LLE moderate PAD   I discussed with the patient the natural history of critical limb ischemia: 25% require amputation in one year, 50% are able to maintain their limbs in one year, and 25-30% die in one year due to comorbidities.  Given the limb threatening status of this patient, I recommend an aggressive  work up including proceeding with an: Aortogram, Bilateral runoff and possible intervention RLE I discussed with the patient the nature of angiographic procedures, especially the limited patencies of any endovascular intervention. The patient is aware of that the risks of an angiographic procedure include but are not limited to: bleeding, infection, access site complications, embolization, rupture of treated vessel, dissection, possible need for emergent surgical intervention, and possible need for surgical procedures to treat the patient's pathology. The patient is aware of the risks and agrees to proceed.  The procedure is scheduled for: 9 JAN 17.  I discussed in depth  with the patient the nature of atherosclerosis, and emphasized the importance of maximal medical management including strict control of blood pressure, blood glucose, and lipid levels, antiplatelet agents, obtaining regular exercise, and cessation of smoking.  The patient is aware that without maximal medical management the underlying atherosclerotic disease process will progress, limiting the benefit of any interventions. The patient is currently not on a statin.  The patient will have a lipid panel drawn to evaluate prior to any surgical intervention. The patient is currently on an anti-platelet.  The patient will be started on ASA 81 mg PO daily after his angiographic procedure. I suspect this patient is not going to be an endovascular candidate as a flush SFA occlusion is seen on duplex.  He will likely need ASAP surgical revascularization and R great toe amputation.  Thank you for allowing [place] to participate in this patient's care.   Adele Barthel, MD [facility] Office: [phone] Pager: [phone]  11/23/2015, 4:38 PM

## 2015-11-26 NOTE — Telephone Encounter (Signed)
Pt is going to have surgery wed so had to cancel appt wed, said if you would like you could call Nicole or joanne and talk to them if needed. [name] called and just wanted to let you know

## 2015-11-26 NOTE — Progress Notes (Signed)
Lower Extremity Vein Map    Right Great Saphenous Vein   Segment Diameter Comment  1. Origin 4.107mm   2. High Thigh 2.36mm   3. Mid Thigh 3.5mm Chronic thrombus, wall thickening, branch  4. Low Thigh 4.82mm Wall thickening  5. At Knee 3.22mm   6. High Calf 2.2mm branch  7. Low Calf 2.48mm branch  8. Ankle 2.33mm    mm    mm    mm     Right Small Saphenous Vein  Segment Diameter Comment  1. Origin mm   2. High Calf 0.41mm   3. Low Calf 1.32mm   4. Ankle mm Too small   mm    mm    mm     Left Great Saphenous Vein   Segment Diameter Comment  1. Origin 5.22mm   2. High Thigh 3.10mm   3. Mid Thigh 3.37mm Chronic thrombus, wall thickening  4. Low Thigh 2.69mm branch  5. At Knee 2.80mm   6. High Calf 2.104mm branch  7. Low Calf 2.18mm   8. Ankle 2.48mm    mm    mm    mm     Left Small Saphenous Vein  Segment Diameter Comment  1. Origin 3.92mm   2. High Calf 0.9mm branch  3. Low Calf mm Too small  4. Ankle mm    mm    mm    mm     Landry Mellow, RDMS, RVT 11/26/2015

## 2015-11-26 NOTE — Discharge Instructions (Signed)
Angiogram, Care After °Refer to this sheet in the next few weeks. These instructions provide you with information about caring for yourself after your procedure. Your health care provider may also give you more specific instructions. Your treatment has been planned according to current medical practices, but problems sometimes occur. Call your health care provider if you have any problems or questions after your procedure. °WHAT TO EXPECT AFTER THE PROCEDURE °After your procedure, it is typical to have the following: °· Bruising at the catheter insertion site that usually fades within 1-2 weeks. °· Blood collecting in the tissue (hematoma) that may be painful to the touch. It should usually decrease in size and tenderness within 1-2 weeks. °HOME CARE INSTRUCTIONS °· Take medicines only as directed by your health care provider. °· You may shower 24-48 hours after the procedure or as directed by your health care provider. Remove the bandage (dressing) and gently wash the site with plain soap and water. Pat the area dry with a clean towel. Do not rub the site, because this may cause bleeding. °· Do not take baths, swim, or use a hot tub until your health care provider approves. °· Check your insertion site every day for redness, swelling, or drainage. °· Do not apply powder or lotion to the site. °· Do not lift over 10 lb (4.5 kg) for 5 days after your procedure or as directed by your health care provider. °· Ask your health care provider when it is okay to: °¨ Return to work or school. °¨ Resume usual physical activities or sports. °¨ Resume sexual activity. °· Do not drive home if you are discharged the same day as the procedure. Have someone else drive you. °· You may drive 24 hours after the procedure unless otherwise instructed by your health care provider. °· Do not operate machinery or power tools for 24 hours after the procedure or as directed by your health care provider. °· If your procedure was done as an  outpatient procedure, which means that you went home the same day as your procedure, a responsible adult should be with you for the first 24 hours after you arrive home. °· Keep all follow-up visits as directed by your health care provider. This is important. °SEEK MEDICAL CARE IF: °· You have a fever. °· You have chills. °· You have increased bleeding from the catheter insertion site. Hold pressure on the site. °SEEK IMMEDIATE MEDICAL CARE IF: °· You have unusual pain at the catheter insertion site. °· You have redness, warmth, or swelling at the catheter insertion site. °· You have drainage (other than a small amount of blood on the dressing) from the catheter insertion site. °· The catheter insertion site is bleeding, and the bleeding does not stop after 30 minutes of holding steady pressure on the site. °· The area near or just beyond the catheter insertion site becomes pale, cool, tingly, or numb. °  °This information is not intended to replace advice given to you by your health care provider. Make sure you discuss any questions you have with your health care provider. °  °Document Released: 05/22/2005 Document Revised: 11/24/2014 Document Reviewed: 04/06/2013 °Elsevier Interactive Patient Education ©2016 [employer] ° °

## 2015-11-27 ENCOUNTER — Encounter (HOSPITAL_COMMUNITY): Payer: Self-pay | Admitting: Vascular Surgery

## 2015-11-27 LAB — HEMOGLOBIN A1C
HEMOGLOBIN A1C: 5.7 % — AB (ref 4.8–5.6)
Mean Plasma Glucose: 117 mg/dL

## 2015-11-27 MED ORDER — CHLORHEXIDINE GLUCONATE CLOTH 2 % EX PADS: 6.0000 | MEDICATED_PAD | Freq: Once | CUTANEOUS | Status: DC

## 2015-11-27 MED ORDER — DEXTROSE 5 % IV SOLN
1.5000 g | INTRAVENOUS | Status: AC
  Administered 2015-11-28: 1.5 g via INTRAVENOUS
  Filled 2015-11-27: qty 1.5

## 2015-11-27 MED ORDER — SODIUM CHLORIDE 0.9 % IV SOLN: INTRAVENOUS | Status: DC

## 2015-11-27 MED ORDER — MUPIROCIN 2 % EX OINT
1.0000 "application " | TOPICAL_OINTMENT | Freq: Once | CUTANEOUS | Status: DC
  Filled 2015-11-27: qty 22

## 2015-11-27 NOTE — Anesthesia Preprocedure Evaluation (Addendum)
Anesthesia Evaluation  Patient identified by MRN, date of birth, ID band Patient awake    Reviewed: Allergy & Precautions, NPO status , Patient's Chart, lab work & pertinent test results  Airway Mallampati: II  TM Distance: >3 FB Neck ROM: Full    Dental  (+) Partial Upper, Partial Lower   Pulmonary sleep apnea , former smoker,    breath sounds clear to auscultation       Cardiovascular hypertension, Pt. on medications + Peripheral Vascular Disease   Rhythm:Regular Rate:Normal     Neuro/Psych negative neurological ROS  negative psych ROS   GI/Hepatic negative GI ROS, Neg liver ROS,   Endo/Other  diabetes, Type 2  Renal/GU negative Renal ROS  negative genitourinary   Musculoskeletal negative musculoskeletal ROS (+)   Abdominal   Peds negative pediatric ROS (+)  Hematology negative hematology ROS (+)   Anesthesia Other Findings   Reproductive/Obstetrics negative OB ROS                            Lab Results  Component Value Date   WBC 8.5 11/26/2015   HGB 15.2 11/26/2015   HCT 45.2 11/26/2015   MCV 93.2 11/26/2015   PLT 239 11/26/2015   Lab Results  Component Value Date   CREATININE 0.80 11/26/2015   BUN 20 11/26/2015   NA 140 11/26/2015   K 3.9 11/26/2015   CL 105 11/26/2015   CO2 25 11/26/2015   Lab Results  Component Value Date   INR 1.13 11/26/2015   11/2015 EKG: NSR with PAC's noted.   Anesthesia Physical Anesthesia Plan  ASA: II  Anesthesia Plan: General   Post-op Pain Management:    Induction: Intravenous  Airway Management Planned: Oral ETT  Additional Equipment: Arterial line  Intra-op Plan:   Post-operative Plan: Extubation in OR  Informed Consent: I have reviewed the patients History and Physical, chart, labs and discussed the procedure including the risks, benefits and alternatives for the proposed anesthesia with the patient or authorized  representative who has indicated his/her understanding and acceptance.   Dental advisory given  Plan Discussed with: CRNA  Anesthesia Plan Comments:         Anesthesia Quick Evaluation

## 2015-11-27 NOTE — Progress Notes (Signed)
Pt denies cardiac history, chest pain or sob. Recently diagnosed with diabetes, A1C was 5.7 yesterday.

## 2015-11-28 ENCOUNTER — Ambulatory Visit: Payer: PRIVATE HEALTH INSURANCE | Admitting: Family Medicine

## 2015-11-28 ENCOUNTER — Inpatient Hospital Stay (HOSPITAL_COMMUNITY): Payer: Medicare Other | Admitting: Certified Registered Nurse Anesthetist

## 2015-11-28 ENCOUNTER — Inpatient Hospital Stay (HOSPITAL_COMMUNITY)
Admission: AD | Admit: 2015-11-28 | Discharge: 2015-12-03 | DRG: 253 | Disposition: A | Discharge status: Home Health | Payer: Medicare Other | Source: Ambulatory Visit | Attending: Vascular Surgery | Admitting: Vascular Surgery

## 2015-11-28 ENCOUNTER — Encounter (HOSPITAL_COMMUNITY)
Admission: AD | Disposition: A | Discharge status: Home Health | Payer: Self-pay | Source: Ambulatory Visit | Attending: Vascular Surgery

## 2015-11-28 DIAGNOSIS — I70261 Atherosclerosis of native arteries of extremities with gangrene, right leg: Secondary | ICD-10-CM | POA: Diagnosis not present

## 2015-11-28 DIAGNOSIS — Z7982 Long term (current) use of aspirin: Secondary | ICD-10-CM | POA: Diagnosis not present

## 2015-11-28 DIAGNOSIS — I70249 Atherosclerosis of native arteries of left leg with ulceration of unspecified site: Secondary | ICD-10-CM | POA: Diagnosis present

## 2015-11-28 DIAGNOSIS — I701 Atherosclerosis of renal artery: Secondary | ICD-10-CM | POA: Diagnosis present

## 2015-11-28 DIAGNOSIS — I10 Essential (primary) hypertension: Secondary | ICD-10-CM | POA: Diagnosis present

## 2015-11-28 DIAGNOSIS — Z87891 Personal history of nicotine dependence: Secondary | ICD-10-CM | POA: Diagnosis not present

## 2015-11-28 DIAGNOSIS — I743 Embolism and thrombosis of arteries of the lower extremities: Secondary | ICD-10-CM | POA: Diagnosis present

## 2015-11-28 DIAGNOSIS — Z79899 Other long term (current) drug therapy: Secondary | ICD-10-CM | POA: Diagnosis not present

## 2015-11-28 DIAGNOSIS — M79674 Pain in right toe(s): Secondary | ICD-10-CM | POA: Diagnosis not present

## 2015-11-28 DIAGNOSIS — I8001 Phlebitis and thrombophlebitis of superficial vessels of right lower extremity: Secondary | ICD-10-CM | POA: Diagnosis present

## 2015-11-28 DIAGNOSIS — L97519 Non-pressure chronic ulcer of other part of right foot with unspecified severity: Secondary | ICD-10-CM | POA: Diagnosis present

## 2015-11-28 DIAGNOSIS — I7025 Atherosclerosis of native arteries of other extremities with ulceration: Secondary | ICD-10-CM | POA: Diagnosis not present

## 2015-11-28 DIAGNOSIS — Z419 Encounter for procedure for purposes other than remedying health state, unspecified: Secondary | ICD-10-CM

## 2015-11-28 DIAGNOSIS — E1152 Type 2 diabetes mellitus with diabetic peripheral angiopathy with gangrene: Secondary | ICD-10-CM | POA: Diagnosis present

## 2015-11-28 HISTORY — PX: FEMORAL-POPLITEAL BYPASS GRAFT: SHX937

## 2015-11-28 LAB — SURGICAL PCR SCREEN
MRSA, PCR: NEGATIVE
Staphylococcus aureus: POSITIVE — AB

## 2015-11-28 LAB — GLUCOSE, CAPILLARY
GLUCOSE-CAPILLARY: 83 mg/dL (ref 65–99)
GLUCOSE-CAPILLARY: 117 mg/dL — AB (ref 65–99)
Glucose-Capillary: 93 mg/dL (ref 65–99)

## 2015-11-28 SURGERY — BYPASS GRAFT FEMORAL-POPLITEAL ARTERY
Anesthesia: General | Site: Leg Upper | Laterality: Right

## 2015-11-28 MED ORDER — EPHEDRINE SULFATE 50 MG/ML IJ SOLN
INTRAMUSCULAR | Status: AC
  Filled 2015-11-28: qty 1

## 2015-11-28 MED ORDER — PHENOL 1.4 % MT LIQD: 1.0000 | OROMUCOSAL | Status: DC | PRN

## 2015-11-28 MED ORDER — ONDANSETRON HCL 4 MG/2ML IJ SOLN
4.0000 mg | Freq: Four times a day (QID) | INTRAMUSCULAR | Status: DC | PRN
Start: 2015-11-28 — End: 2015-12-03

## 2015-11-28 MED ORDER — HYDROMORPHONE HCL 1 MG/ML IJ SOLN
INTRAMUSCULAR | Status: AC
  Filled 2015-11-28: qty 1

## 2015-11-28 MED ORDER — FENTANYL CITRATE (PF) 250 MCG/5ML IJ SOLN
INTRAMUSCULAR | Status: AC
  Filled 2015-11-28: qty 5

## 2015-11-28 MED ORDER — LISINOPRIL 10 MG PO TABS
20.0000 mg | ORAL_TABLET | Freq: Every day | ORAL | Status: DC
  Administered 2015-11-28 – 2015-12-03 (×6): 20 mg via ORAL
  Filled 2015-11-28: qty 1
  Filled 2015-11-28 (×2): qty 2
  Filled 2015-11-28: qty 1
  Filled 2015-11-28 (×3): qty 2

## 2015-11-28 MED ORDER — ALUM & MAG HYDROXIDE-SIMETH 200-200-20 MG/5ML PO SUSP: 15.0000 mL | ORAL | Status: DC | PRN

## 2015-11-28 MED ORDER — PHENYLEPHRINE HCL 10 MG/ML IJ SOLN
INTRAMUSCULAR | Status: DC | PRN
  Administered 2015-11-28: 80 ug via INTRAVENOUS

## 2015-11-28 MED ORDER — SUCCINYLCHOLINE CHLORIDE 20 MG/ML IJ SOLN
INTRAMUSCULAR | Status: AC
  Filled 2015-11-28: qty 1

## 2015-11-28 MED ORDER — ROCURONIUM BROMIDE 100 MG/10ML IV SOLN
INTRAVENOUS | Status: DC | PRN
Start: 2015-11-28 — End: 2015-11-28
  Administered 2015-11-28: 10 mg via INTRAVENOUS
  Administered 2015-11-28: 40 mg via INTRAVENOUS
  Administered 2015-11-28 (×2): 20 mg via INTRAVENOUS
  Administered 2015-11-28 (×2): 10 mg via INTRAVENOUS
  Administered 2015-11-28: 50 mg via INTRAVENOUS

## 2015-11-28 MED ORDER — ROCURONIUM BROMIDE 50 MG/5ML IV SOLN
INTRAVENOUS | Status: AC
  Filled 2015-11-28: qty 2

## 2015-11-28 MED ORDER — ENOXAPARIN SODIUM 40 MG/0.4ML ~~LOC~~ SOLN
40.0000 mg | SUBCUTANEOUS | Status: DC
  Administered 2015-11-29 – 2015-12-03 (×4): 40 mg via SUBCUTANEOUS
  Filled 2015-11-28 (×4): qty 0.4

## 2015-11-28 MED ORDER — SODIUM CHLORIDE 0.9 % IV SOLN: 500.0000 mL | Freq: Once | INTRAVENOUS | Status: DC | PRN

## 2015-11-28 MED ORDER — OXYCODONE-ACETAMINOPHEN 5-325 MG PO TABS
1.0000 | ORAL_TABLET | ORAL | Status: DC | PRN
  Administered 2015-11-28 – 2015-12-03 (×26): 2 via ORAL
  Filled 2015-11-28 (×24): qty 2

## 2015-11-28 MED ORDER — PROTAMINE SULFATE 10 MG/ML IV SOLN
INTRAVENOUS | Status: DC | PRN
  Administered 2015-11-28 (×4): 10 mg via INTRAVENOUS

## 2015-11-28 MED ORDER — GUAIFENESIN-DM 100-10 MG/5ML PO SYRP: 15.0000 mL | ORAL_SOLUTION | ORAL | Status: DC | PRN

## 2015-11-28 MED ORDER — LABETALOL HCL 5 MG/ML IV SOLN
10.0000 mg | INTRAVENOUS | Status: DC | PRN
  Administered 2015-11-30: 10 mg via INTRAVENOUS
  Filled 2015-11-28: qty 4

## 2015-11-28 MED ORDER — PROPOFOL 10 MG/ML IV BOLUS
INTRAVENOUS | Status: DC | PRN
  Administered 2015-11-28: 150 mg via INTRAVENOUS
  Administered 2015-11-28: 50 mg via INTRAVENOUS

## 2015-11-28 MED ORDER — HYDRALAZINE HCL 20 MG/ML IJ SOLN
5.0000 mg | INTRAMUSCULAR | Status: DC | PRN
  Administered 2015-11-28: 5 mg via INTRAVENOUS

## 2015-11-28 MED ORDER — DEXAMETHASONE SODIUM PHOSPHATE 4 MG/ML IJ SOLN
INTRAMUSCULAR | Status: AC
  Filled 2015-11-28: qty 1

## 2015-11-28 MED ORDER — LIDOCAINE HCL (CARDIAC) 20 MG/ML IV SOLN
INTRAVENOUS | Status: AC
  Filled 2015-11-28: qty 5

## 2015-11-28 MED ORDER — HEMOSTATIC AGENTS (NO CHARGE) OPTIME
TOPICAL | Status: DC | PRN
  Administered 2015-11-28: 1 via TOPICAL

## 2015-11-28 MED ORDER — MORPHINE SULFATE (PF) 2 MG/ML IV SOLN
INTRAVENOUS | Status: AC
  Filled 2015-11-28: qty 1

## 2015-11-28 MED ORDER — ONDANSETRON HCL 4 MG/2ML IJ SOLN
INTRAMUSCULAR | Status: AC
  Filled 2015-11-28: qty 2

## 2015-11-28 MED ORDER — MEPERIDINE HCL 25 MG/ML IJ SOLN: 6.2500 mg | INTRAMUSCULAR | Status: DC | PRN

## 2015-11-28 MED ORDER — FENTANYL CITRATE (PF) 100 MCG/2ML IJ SOLN
INTRAMUSCULAR | Status: DC | PRN
  Administered 2015-11-28 (×4): 50 ug via INTRAVENOUS

## 2015-11-28 MED ORDER — MIDAZOLAM HCL 5 MG/5ML IJ SOLN
INTRAMUSCULAR | Status: DC | PRN
  Administered 2015-11-28: 2 mg via INTRAVENOUS

## 2015-11-28 MED ORDER — HYDRALAZINE HCL 20 MG/ML IJ SOLN
INTRAMUSCULAR | Status: AC
Start: 2015-11-28 — End: 2015-11-29
  Filled 2015-11-28: qty 1

## 2015-11-28 MED ORDER — ACETAMINOPHEN 325 MG PO TABS
325.0000 mg | ORAL_TABLET | ORAL | Status: DC | PRN
  Administered 2015-11-29: 650 mg via ORAL
  Filled 2015-11-28: qty 2

## 2015-11-28 MED ORDER — DOCUSATE SODIUM 100 MG PO CAPS
100.0000 mg | ORAL_CAPSULE | Freq: Every day | ORAL | Status: DC
  Administered 2015-11-29 – 2015-12-03 (×5): 100 mg via ORAL
  Filled 2015-11-28 (×5): qty 1

## 2015-11-28 MED ORDER — HYDROCHLOROTHIAZIDE 12.5 MG PO CAPS
12.5000 mg | ORAL_CAPSULE | Freq: Every day | ORAL | Status: DC
  Administered 2015-11-28 – 2015-12-03 (×6): 12.5 mg via ORAL
  Filled 2015-11-28 (×6): qty 1

## 2015-11-28 MED ORDER — SUGAMMADEX SODIUM 200 MG/2ML IV SOLN
INTRAVENOUS | Status: AC
  Filled 2015-11-28: qty 2

## 2015-11-28 MED ORDER — OXYCODONE-ACETAMINOPHEN 5-325 MG PO TABS
ORAL_TABLET | ORAL | Status: AC
  Filled 2015-11-28: qty 2

## 2015-11-28 MED ORDER — HEPARIN SODIUM (PORCINE) 5000 UNIT/ML IJ SOLN
INTRAMUSCULAR | Status: DC | PRN
  Administered 2015-11-28: 500 mL

## 2015-11-28 MED ORDER — MORPHINE SULFATE (PF) 4 MG/ML IV SOLN
INTRAVENOUS | Status: AC
  Filled 2015-11-28: qty 1

## 2015-11-28 MED ORDER — PANTOPRAZOLE SODIUM 40 MG PO TBEC
40.0000 mg | DELAYED_RELEASE_TABLET | Freq: Every day | ORAL | Status: DC
  Administered 2015-11-28 – 2015-12-03 (×6): 40 mg via ORAL
  Filled 2015-11-28 (×6): qty 1

## 2015-11-28 MED ORDER — CEFUROXIME SODIUM 1.5 G IJ SOLR
1.5000 g | Freq: Two times a day (BID) | INTRAMUSCULAR | Status: AC
  Administered 2015-11-28 – 2015-11-29 (×2): 1.5 g via INTRAVENOUS
  Filled 2015-11-28 (×2): qty 1.5

## 2015-11-28 MED ORDER — ONDANSETRON HCL 4 MG/2ML IJ SOLN
INTRAMUSCULAR | Status: DC | PRN
  Administered 2015-11-28: 4 mg via INTRAVENOUS

## 2015-11-28 MED ORDER — HYDROMORPHONE HCL 1 MG/ML IJ SOLN
0.2500 mg | INTRAMUSCULAR | Status: DC | PRN
  Administered 2015-11-28 (×4): 0.5 mg via INTRAVENOUS

## 2015-11-28 MED ORDER — THROMBIN 20000 UNITS EX SOLR
CUTANEOUS | Status: AC
  Filled 2015-11-28: qty 20000

## 2015-11-28 MED ORDER — MIDAZOLAM HCL 2 MG/2ML IJ SOLN
INTRAMUSCULAR | Status: AC
  Filled 2015-11-28: qty 2

## 2015-11-28 MED ORDER — LACTATED RINGERS IV SOLN
INTRAVENOUS | Status: DC | PRN
  Administered 2015-11-28: 09:00:00 via INTRAVENOUS

## 2015-11-28 MED ORDER — ROCURONIUM BROMIDE 50 MG/5ML IV SOLN
INTRAVENOUS | Status: AC
  Filled 2015-11-28: qty 1

## 2015-11-28 MED ORDER — GLYCOPYRROLATE 0.2 MG/ML IJ SOLN
INTRAMUSCULAR | Status: AC
  Filled 2015-11-28: qty 1

## 2015-11-28 MED ORDER — METOPROLOL TARTRATE 1 MG/ML IV SOLN: 2.0000 mg | INTRAVENOUS | Status: DC | PRN

## 2015-11-28 MED ORDER — DEXAMETHASONE SODIUM PHOSPHATE 4 MG/ML IJ SOLN
INTRAMUSCULAR | Status: DC | PRN
  Administered 2015-11-28: 4 mg via INTRAVENOUS

## 2015-11-28 MED ORDER — LACTATED RINGERS IV SOLN
INTRAVENOUS | Status: DC | PRN
  Administered 2015-11-28 (×3): via INTRAVENOUS

## 2015-11-28 MED ORDER — SODIUM CHLORIDE 0.9 % IJ SOLN
INTRAMUSCULAR | Status: AC
  Filled 2015-11-28: qty 10

## 2015-11-28 MED ORDER — MAGNESIUM SULFATE 2 GM/50ML IV SOLN
2.0000 g | Freq: Every day | INTRAVENOUS | Status: DC | PRN
  Filled 2015-11-28: qty 50

## 2015-11-28 MED ORDER — PHENYLEPHRINE 40 MCG/ML (10ML) SYRINGE FOR IV PUSH (FOR BLOOD PRESSURE SUPPORT)
PREFILLED_SYRINGE | INTRAVENOUS | Status: AC
  Filled 2015-11-28: qty 10

## 2015-11-28 MED ORDER — POTASSIUM CHLORIDE CRYS ER 20 MEQ PO TBCR: 20.0000 meq | EXTENDED_RELEASE_TABLET | Freq: Every day | ORAL | Status: DC | PRN

## 2015-11-28 MED ORDER — SUGAMMADEX SODIUM 200 MG/2ML IV SOLN
INTRAVENOUS | Status: DC | PRN
  Administered 2015-11-28: 200 mg via INTRAVENOUS

## 2015-11-28 MED ORDER — LACTATED RINGERS IV SOLN: INTRAVENOUS | Status: DC

## 2015-11-28 MED ORDER — PROPOFOL 10 MG/ML IV BOLUS
INTRAVENOUS | Status: AC
  Filled 2015-11-28: qty 40

## 2015-11-28 MED ORDER — HEPARIN SODIUM (PORCINE) 1000 UNIT/ML IJ SOLN
INTRAMUSCULAR | Status: AC
  Filled 2015-11-28: qty 1

## 2015-11-28 MED ORDER — ACETAMINOPHEN 650 MG RE SUPP: 325.0000 mg | RECTAL | Status: DC | PRN

## 2015-11-28 MED ORDER — PROMETHAZINE HCL 25 MG/ML IJ SOLN: 6.2500 mg | INTRAMUSCULAR | Status: DC | PRN

## 2015-11-28 MED ORDER — SODIUM CHLORIDE 0.45 % IV SOLN
INTRAVENOUS | Status: DC
  Administered 2015-11-28 – 2015-11-30 (×2): via INTRAVENOUS

## 2015-11-28 MED ORDER — LISINOPRIL-HYDROCHLOROTHIAZIDE 20-12.5 MG PO TABS: 1.0000 | ORAL_TABLET | Freq: Every day | ORAL | Status: DC

## 2015-11-28 MED ORDER — EPHEDRINE SULFATE 50 MG/ML IJ SOLN
INTRAMUSCULAR | Status: DC | PRN
  Administered 2015-11-28: 10 mg via INTRAVENOUS

## 2015-11-28 MED ORDER — LIDOCAINE HCL (CARDIAC) 20 MG/ML IV SOLN
INTRAVENOUS | Status: DC | PRN
  Administered 2015-11-28: 60 mg via INTRAVENOUS

## 2015-11-28 MED ORDER — MORPHINE SULFATE (PF) 2 MG/ML IV SOLN
2.0000 mg | INTRAVENOUS | Status: DC | PRN
  Administered 2015-11-28 – 2015-12-01 (×12): 2 mg via INTRAVENOUS
  Administered 2015-12-01: 4 mg via INTRAVENOUS
  Administered 2015-12-01: 2 mg via INTRAVENOUS
  Administered 2015-12-02 (×2): 4 mg via INTRAVENOUS
  Administered 2015-12-03: 2 mg via INTRAVENOUS
  Filled 2015-11-28 (×3): qty 1
  Filled 2015-11-28: qty 2
  Filled 2015-11-28 (×2): qty 1
  Filled 2015-11-28: qty 2
  Filled 2015-11-28 (×5): qty 1
  Filled 2015-11-28: qty 2

## 2015-11-28 MED ORDER — 0.9 % SODIUM CHLORIDE (POUR BTL) OPTIME
TOPICAL | Status: DC | PRN
  Administered 2015-11-28: 2000 mL

## 2015-11-28 MED ORDER — HEPARIN SODIUM (PORCINE) 1000 UNIT/ML IJ SOLN
INTRAMUSCULAR | Status: DC | PRN
  Administered 2015-11-28: 8000 [IU] via INTRAVENOUS
  Administered 2015-11-28: 2000 [IU] via INTRAVENOUS

## 2015-11-28 MED ORDER — PHENYLEPHRINE HCL 10 MG/ML IJ SOLN
10.0000 mg | INTRAVENOUS | Status: DC | PRN
  Administered 2015-11-28: 20 ug/min via INTRAVENOUS

## 2015-11-28 SURGICAL SUPPLY — 70 items
AGENT HMST SPONGE THK3/8 (HEMOSTASIS) ×1
BANDAGE ELASTIC 4 VELCRO ST LF (GAUZE/BANDAGES/DRESSINGS) IMPLANT
BANDAGE ESMARK 6X9 LF (GAUZE/BANDAGES/DRESSINGS) IMPLANT
BLADE CLIPPER SURG (BLADE) ×1 IMPLANT
BNDG CMPR 9X6 STRL LF SNTH (GAUZE/BANDAGES/DRESSINGS) ×1
BNDG CONFORM 2 STRL LF (GAUZE/BANDAGES/DRESSINGS) ×1 IMPLANT
BNDG ESMARK 6X9 LF (GAUZE/BANDAGES/DRESSINGS) ×2
CANISTER SUCTION 2500CC (MISCELLANEOUS) ×2 IMPLANT
CLIP TI MEDIUM 24 (CLIP) ×2 IMPLANT
CLIP TI WIDE RED SMALL 24 (CLIP) ×2 IMPLANT
COVER PROBE W GEL 5X96 (DRAPES) ×3 IMPLANT
CUFF TOURNIQUET SINGLE 24IN (TOURNIQUET CUFF) IMPLANT
CUFF TOURNIQUET SINGLE 34IN LL (TOURNIQUET CUFF) ×1 IMPLANT
CUFF TOURNIQUET SINGLE 44IN (TOURNIQUET CUFF) IMPLANT
DRAIN CHANNEL 15F RND FF W/TCR (WOUND CARE) IMPLANT
DRAPE C-ARM 42X72 X-RAY (DRAPES) ×2 IMPLANT
DRAPE PROXIMA HALF (DRAPES) IMPLANT
DRSG COVADERM 4X10 (GAUZE/BANDAGES/DRESSINGS) IMPLANT
DRSG COVADERM 4X8 (GAUZE/BANDAGES/DRESSINGS) IMPLANT
ELECT CAUTERY BLADE 6.4 (BLADE) ×1 IMPLANT
ELECT REM PT RETURN 9FT ADLT (ELECTROSURGICAL) ×2
ELECTRODE REM PT RTRN 9FT ADLT (ELECTROSURGICAL) ×1 IMPLANT
EVACUATOR SILICONE 100CC (DRAIN) IMPLANT
GLOVE BIO SURGEON STRL SZ 6.5 (GLOVE) ×2 IMPLANT
GLOVE BIO SURGEON STRL SZ7 (GLOVE) ×3 IMPLANT
GLOVE BIOGEL PI IND STRL 6.5 (GLOVE) IMPLANT
GLOVE BIOGEL PI IND STRL 7.0 (GLOVE) IMPLANT
GLOVE BIOGEL PI IND STRL 7.5 (GLOVE) ×1 IMPLANT
GLOVE BIOGEL PI INDICATOR 6.5 (GLOVE) ×6
GLOVE BIOGEL PI INDICATOR 7.0 (GLOVE) ×1
GLOVE BIOGEL PI INDICATOR 7.5 (GLOVE) ×1
GLOVE SURG SS PI 6.0 STRL IVOR (GLOVE) ×2 IMPLANT
GLOVE SURG SS PI 7.0 STRL IVOR (GLOVE) ×1 IMPLANT
GOWN STRL REUS W/ TWL LRG LVL3 (GOWN DISPOSABLE) ×3 IMPLANT
GOWN STRL REUS W/TWL LRG LVL3 (GOWN DISPOSABLE) ×8
GOWN STRL REUS W/TWL XL LVL3 (GOWN DISPOSABLE) ×1 IMPLANT
GRAFT PROPATEN W/RING 6X80X60 (Vascular Products) ×1 IMPLANT
HEMOSTAT SPONGE AVITENE ULTRA (HEMOSTASIS) ×1 IMPLANT
INSERT FOGARTY SM (MISCELLANEOUS) IMPLANT
KIT BASIN OR (CUSTOM PROCEDURE TRAY) ×2 IMPLANT
KIT ROOM TURNOVER OR (KITS) ×2 IMPLANT
LIQUID BAND (GAUZE/BANDAGES/DRESSINGS) ×2 IMPLANT
MARKER GRAFT CORONARY BYPASS (MISCELLANEOUS) IMPLANT
NS IRRIG 1000ML POUR BTL (IV SOLUTION) ×4 IMPLANT
PACK PERIPHERAL VASCULAR (CUSTOM PROCEDURE TRAY) ×2 IMPLANT
PAD ARMBOARD 7.5X6 YLW CONV (MISCELLANEOUS) ×4 IMPLANT
PADDING CAST COTTON 6X4 STRL (CAST SUPPLIES) IMPLANT
PENCIL BUTTON HOLSTER BLD 10FT (ELECTRODE) ×1 IMPLANT
SET MICROPUNCTURE 5F STIFF (MISCELLANEOUS) IMPLANT
SPONGE LAP 18X18 X RAY DECT (DISPOSABLE) ×1 IMPLANT
STAPLER VISISTAT 35W (STAPLE) IMPLANT
STOPCOCK 4 WAY LG BORE MALE ST (IV SETS) IMPLANT
SUT ETHILON 3 0 PS 1 (SUTURE) IMPLANT
SUT GORETEX 5 0 TT13 24 (SUTURE) ×1 IMPLANT
SUT GORETEX 6.0 TT13 (SUTURE) ×1 IMPLANT
SUT MNCRL AB 4-0 PS2 18 (SUTURE) ×6 IMPLANT
SUT PROLENE 5 0 C 1 24 (SUTURE) ×2 IMPLANT
SUT PROLENE 6 0 BV (SUTURE) ×5 IMPLANT
SUT PROLENE 7 0 BV 1 (SUTURE) IMPLANT
SUT SILK 2 0 FS (SUTURE) IMPLANT
SUT SILK 3 0 (SUTURE)
SUT SILK 3-0 18XBRD TIE 12 (SUTURE) IMPLANT
SUT VIC AB 2-0 CT1 27 (SUTURE) ×4
SUT VIC AB 2-0 CT1 TAPERPNT 27 (SUTURE) ×2 IMPLANT
SUT VIC AB 3-0 SH 27 (SUTURE) ×6
SUT VIC AB 3-0 SH 27X BRD (SUTURE) ×3 IMPLANT
TRAY FOLEY W/METER SILVER 16FR (SET/KITS/TRAYS/PACK) ×2 IMPLANT
TUBING EXTENTION W/L.L. (IV SETS) IMPLANT
UNDERPAD 30X30 INCONTINENT (UNDERPADS AND DIAPERS) ×2 IMPLANT
WATER STERILE IRR 1000ML POUR (IV SOLUTION) ×2 IMPLANT

## 2015-11-28 NOTE — H&P (View-Only) (Signed)
Referred by:  Denita Lung, MD [address] [place], [place] [address]  Reason for referral: Right great toe ischemia  History of Present Illness  Adam [name] is a 65 y.o. (15-Jul-1951) male who presents with chief complaint: painful black right great toe.  Onset of symptom occurred over last 3 weeks, increasing pain in R great toe with darkening color.  The was seen by Dr. Sharol Given yesterday and he had concerns that he had an ischemic toe due PAD.   Pain is described as achy, severity 10/10, and associated with rest and movement.  Patient has attempted to treat this pain with Tramadol without relief.  The patient has intermittentrest pain symptoms.  He denies any significant intermittent claudication.  He denies any drainage or fever or chills.  Atherosclerotic risk factors include: DM, HTN, and prior smoking.  Past Medical History  Diagnosis Date  . Diabetes mellitus without complication ([place])   . Hypertension   . Peripheral vascular disease ([place])   . Gangrene ([place]) 11/22/2015    right great toe    Past Surgical History  Procedure Laterality Date  . Hernia repair  2000    Social History   Social History  . Marital Status: Married    Spouse Name: N/A  . Number of Children: N/A  . Years of Education: N/A   Occupational History  . Not on file.   Social History Main Topics  . Smoking status: Former Smoker    Quit date: 10/06/2011  . Smokeless tobacco: Never Used  . Alcohol Use: 4.2 oz/week    7 Glasses of wine, 0 Standard drinks or equivalent per week  . Drug Use: No  . Sexual Activity: Yes   Other Topics Concern  . Not on file   Social History Narrative    Family History  Problem Relation Age of Onset  . Hypertension Mother     Current Outpatient Prescriptions  Medication Sig Dispense Refill  . lisinopril-hydrochlorothiazide (PRINZIDE,ZESTORETIC) 20-12.5 MG per tablet Take 1 tablet by mouth daily. 90 tablet 3  . traMADol (ULTRAM) 50 MG tablet Take 1  tablet (50 mg total) by mouth every 8 (eight) hours as needed. 30 tablet 0  . oxyCODONE-acetaminophen (PERCOCET/ROXICET) 5-325 MG tablet Take 1-2 tablets by mouth every 6 (six) hours as needed for moderate pain. 30 tablet 0   No current facility-administered medications for this visit.    No Known Allergies   REVIEW OF SYSTEMS:  (Positives checked otherwise negative)  CARDIOVASCULAR:   [ ]  chest pain,  [ ]  chest pressure,  [ ]  palpitations,  [ ]  shortness of breath when laying flat,  [ ]  shortness of breath with exertion,   [x]  pain in feet when walking,  [x]  pain in feet when laying flat, [ ]  history of blood clot in veins (DVT),  [ ]  history of phlebitis,  [ ]  swelling in legs,  [ ]  varicose veins  PULMONARY:   [ ]  productive cough,  [ ]  asthma,  [ ]  wheezing  NEUROLOGIC:   [ ]  weakness in arms or legs,  [ ]  numbness in arms or legs,  [ ]  difficulty speaking or slurred speech,  [ ]  temporary loss of vision in one eye,  [ ]  dizziness  HEMATOLOGIC:   [ ]  bleeding problems,  [ ]  problems with blood clotting too easily  MUSCULOSKEL:   [ ]  joint pain, [ ]  joint swelling  GASTROINTEST:   [ ]  vomiting blood,  [ ]   blood in stool     GENITOURINARY:   [ ]  burning with urination,  [ ]  blood in urine  PSYCHIATRIC:   [ ]  history of major depression  INTEGUMENTARY:   [ ]  rashes,  [ ]  ulcers  CONSTITUTIONAL:   [ ]  fever,  [ ]  chills   For VQI Use Only  PRE-ADM LIVING: Home  AMB STATUS: Ambulatory  CAD Sx: None  PRIOR CHF: None  STRESS TEST: [x]  No, [ ]  Normal, [ ]  + ischemia, [ ]  + MI, [ ]  Both   Physical Examination  Filed Vitals:   11/23/15 1613 11/23/15 1619  BP: 199/82 210/91  Pulse: 75 89  Temp: 98.9 F (37.2 C)   TempSrc: Oral   Resp: 16   Height: 5\' 11"  (1.803 m)   Weight: 194 lb (87.998 kg)   SpO2: 98%    Body mass index is 27.07 kg/(m^2).  General: A&O x 3, WDWN  Head: [place]/AT  Ear/Nose/Throat: Hearing grossly intact, nares  w/o erythema or drainage, oropharynx w/o Erythema/Exudate, Mallampati score: 3  Eyes: PERRLA, EOMI  Neck: Supple, no nuchal rigidity, no palpable LAD  Pulmonary: Sym exp, good air movt, CTAB, no rales, rhonchi, & wheezing  Cardiac: RRR, Nl S1, S2, no Murmurs, rubs or gallops  Vascular: Vessel Right Left  Radial Palpable Palpable  Brachial Palpable Palpable  Carotid Palpable, without bruit Palpable, without bruit  Aorta Not palpable N/A  Femoral Palpable Palpable  Popliteal Not palpable Not palpable  PT Not Palpable Not Palpable  DP Not Palpable Not Palpable   Gastrointestinal: soft, NTND, -G/R, - HSM, - masses, - CVAT B  Musculoskeletal: M/S 5/5 throughout , Extremities without ischemic changes except  R great toe appears ischemic with black tip  Neurologic: CN 2-12 intact , Pain and light touch intact in extremities including R great toe, Motor exam as listed above  Psychiatric: Judgment intact, Mood & affect appropriate for pt's clinical situation  Dermatologic: See M/S exam for extremity exam, no rashes otherwise noted  Lymph : No Cervical, Axillary, or Inguinal lymphadenopathy    Non-Invasive Vascular Imaging  ABI (Date: 11/23/2015)  R: 0.49, AT: mono, PT: mono, TBI: not obtained  L: 0.71, DP: mono, PT: mono, TBI: 0.38  RLE arterial duplex (11/23/2015)  Triphasic flow in CFA and PFA  Occluded SFA  Monophasic flow in Pop and tibial arteries  Outside Studies/Documentation 2 pages of outside documents were reviewed including: outpatient ortho chart.   Medical Decision Making  Adam [name] is a 65 y.o. male who presents with: RLE critical limb ischemia, LLE moderate PAD   I discussed with the patient the natural history of critical limb ischemia: 25% require amputation in one year, 50% are able to maintain their limbs in one year, and 25-30% die in one year due to comorbidities.  Given the limb threatening status of this patient, I recommend an aggressive  work up including proceeding with an: Aortogram, Bilateral runoff and possible intervention RLE I discussed with the patient the nature of angiographic procedures, especially the limited patencies of any endovascular intervention. The patient is aware of that the risks of an angiographic procedure include but are not limited to: bleeding, infection, access site complications, embolization, rupture of treated vessel, dissection, possible need for emergent surgical intervention, and possible need for surgical procedures to treat the patient's pathology. The patient is aware of the risks and agrees to proceed.  The procedure is scheduled for: 9 JAN 17.  I discussed in depth  with the patient the nature of atherosclerosis, and emphasized the importance of maximal medical management including strict control of blood pressure, blood glucose, and lipid levels, antiplatelet agents, obtaining regular exercise, and cessation of smoking.  The patient is aware that without maximal medical management the underlying atherosclerotic disease process will progress, limiting the benefit of any interventions. The patient is currently not on a statin.  The patient will have a lipid panel drawn to evaluate prior to any surgical intervention. The patient is currently on an anti-platelet.  The patient will be started on ASA 81 mg PO daily after his angiographic procedure. I suspect this patient is not going to be an endovascular candidate as a flush SFA occlusion is seen on duplex.  He will likely need ASAP surgical revascularization and R great toe amputation.  Thank you for allowing [place] to participate in this patient's care.   Adele Barthel, MD [facility] Office: [phone] Pager: [phone]  11/23/2015, 4:38 PM

## 2015-11-28 NOTE — Transfer of Care (Signed)
Immediate Anesthesia Transfer of Care Note  Patient: Adam [name]  Procedure(s) Performed: Procedure(s): RIGHT  FEMORAL-POPLITEAL ARTERY BYPASS GRAFT AND CONSTRUCTION OF MILLER CUFF USING 6 MM X 80 CM PROPATEN  GRAFT  (Right)  Patient Location: PACU  Anesthesia Type:General  Level of Consciousness: awake, alert , oriented and patient cooperative  Airway & Oxygen Therapy: Patient Spontanous Breathing and Patient connected to nasal cannula oxygen  Post-op Assessment: Report given to RN, Post -op Vital signs reviewed and stable and Patient moving all extremities  Post vital signs: Reviewed and stable  Last Vitals:  Filed Vitals:   11/28/15 0651  BP: 178/63  Pulse: 62  Temp: 36.8 C  Resp: 18    Complications: No apparent anesthesia complications

## 2015-11-28 NOTE — Anesthesia Procedure Notes (Signed)
Procedure Name: Intubation Date/Time: 11/28/2015 8:43 AM Performed by: Layla Maw Pre-anesthesia Checklist: Patient identified, Patient being monitored, Timeout performed, Emergency Drugs available and Suction available Patient Re-evaluated:Patient Re-evaluated prior to inductionOxygen Delivery Method: Circle System Utilized Preoxygenation: Pre-oxygenation with 100% oxygen Intubation Type: IV induction Ventilation: Mask ventilation without difficulty Laryngoscope Size: Miller and 3 Grade View: Grade II Tube type: Oral Tube size: 8.0 mm Number of attempts: 1 Airway Equipment and Method: Stylet Placement Confirmation: ETT inserted through vocal cords under direct vision,  positive ETCO2 and breath sounds checked- equal and bilateral Secured at: 23 cm Tube secured with: Tape Dental Injury: Teeth and Oropharynx as per pre-operative assessment

## 2015-11-28 NOTE — Op Note (Signed)
OPERATIVE NOTE   PROCEDURE: 1.  Right common femoral artery to below-the-knee popliteal artery bypass with Propaten 2.  Construction of Miller cuff on below-the-knee popliteal artery  PRE-OPERATIVE DIAGNOSIS: Right great toe gangrene  POST-OPERATIVE DIAGNOSIS: same as above   SURGEON: Adele Barthel, MD  ASSISTANT(S): Silva Bandy, PAC   ANESTHESIA: general  ESTIMATED BLOOD LOSS: 100 cc  FINDING(S): 1.  Soft chronic thrombus in superficial femoral artery  2.  Dopplerable anterior tibial artery and peroneal artery signal at end of case  SPECIMEN(S):  none  INDICATIONS:   Adam [name] is a 65 y.o. male who presents with right great toe gangrene.  He underwent angiography which demonstrated chronic occlusion of his superficial femoral artery with reconstitution of a popliteal artery at the level of the knee with compromised tibial artery runoff.  I recommended: right femoropopliteal bypass as an attempt to salvage the right foot.  We discussed delaying the great toe amputation until the toe gangrene demarcated itself.  The risk, benefits, and alternative for bypass operations were discussed with the patient.  The patient is aware the risks include but are not limited to: bleeding, infection, myocardial infarction, stroke, limb loss, nerve damage, need for additional procedures in the future, wound complications, and inability to complete the bypass.  The patient is aware of these risks and agreed to proceed. .  DESCRIPTION: After full informed written consent was obtained, the patient was brought back to the operating room and placed supine upon the operating table.  Prior to induction, the patient was given intravenous antibiotics.  After obtaining adequate anesthesia, the patient was prepped and draped in the standard fashion for a femoral to popliteal bypass operation.  Attention was turned to the right groin.  A longitudinal incision was made over the right common femoral artery.   Using blunt dissection and electrocautery, the artery was dissected out from the inguinal ligament down to the femoral bifurcation.  There was adequate length of common femoral artery dissected out to construct a femoropopliteal bypass, so I felt that full dissection of the superficial femoral artery and profunda femoral artery was not necssary.  Circumflex branches were also dissected and controlled with silk ties.  This common femoral artery was found on exam to be diseased but soft.    At this point, attention was turned to the calf.  An longitudinal incision was made one finger-width posterior to the tibia.  Using blunt dissection and electrocautery, a plane was developed through the subcutaneous tissue and fascia down to the popliteal space.  The popliteal vein was dissected out and retracted medially and posteriorly.  The below-the-knee popliteal artery was dissected away from lateral popliteal vein.  This popliteal artery was found on exam to be diseased but not calcified.  In this process, I found a segment of greater saphenous vein at the level of the calf.  From preoperative vein mapping, multiple thickened segments consistent with phlebitis and luminal compromise were found in the right greater saphenous vein.  I planned on constructing a Miller cuff on the below-the-knee popliteal artery with this segment of vein.  I tied off this segment of vein proximally and distally with 2-0 silk ties and then transected the vein segment.  I opened the vein segment longitudinally with a Potts scissor.  There a few sclerotic valves, which were sharply lysed.  The vein wall was also thickened.  I set this segment of vein aside in heparinized saline.  At this point, I bluntly dissected  a space between the femoral condyles adjacent to the below-the-knee popliteal vessels.  I bluntly passed the long Gore metal tunneler between the femoral condyles in a subsartorial fashion to the groin incision.  The bullet on the  tunneler was removed and then a 6 mm Propaten externally ringed graft was sewn to the inner cannula with a 2-0 Silk.  I passed the conduit through the metal tunnel, taking care to maintain the orientation of the conduit.    At this point, the patient was given 8000 units of Heparin intravenously, which was a therapeutic bolus.  In total, 10000 units of Heparin was administrated to achieve and maintain a therapeutic level of anticoagulation, giving another 2000 units every subsequent hourly.  After waiting three minutes, the proximal and distal common femoral artery was clamped.  An incision was made in the common femoral artery and extended proximally and distally with a Potts scissor.  There was some soft chronic thrombus extruding from the superficial femoral artery.  I did a limited endarterectomy to remove any mobile thrombus and remove the extruding chronic thrombus from the flow lumen to the profunda femoral artery.  The proximal conduit was spatulated to the dimensions of the arteriotomy.  The conduit was sewn to the common femoral artery with a running stitch of CV-5.  Prior to completing this anastomosis, all vessels were backbled.  No thrombus was noted from any vessels and backbleeding was: excellent.  The anastomosis was completed in the usual fashion.  At this point, I pulled the graft to tension in the tunnel.  I placed a sterile tourniquet on the distal thigh and then exsanguinated the right leg with an Esmark bandage.  I inflated the tourniquet to 250 mm Hg.  Attention was then turned to the popliteal exposure.   I reset the exposure of the popliteal space.  I verified the popliteal artery was appropriately marked.  I determine the target segment for the anastomosis.  I applied tension to the artery proximally and distally with vessel loops.  I made an incision with a 11-blade in the artery and extended it proximally and distally with a Potts scissor.  I sharply fashioned the vein patch for the  dimensions of the right below-the-knee popliteal artery arteriotomy.  I sewed the vein patch into place with a running stitch of 6-0 Prolene.  I then made an incision in the vein patch and extended it proximally and distally, converting the vein patch into a Miller cuff.    I pulled the conduit to appropriate tension and length, taking into account straightening out the leg.  I adjusted the length of the conduit sharply.  I pulled off excess external rings from the conduit.  I spatulated this conduit to meet the dimensions of the incision in the vein patch.  The 6 mm Propaten graft was sewn to the Miller cuff with a running stitch of CV-6.  Prior to completing this anastomosis, all vessels were backbled.   No thrombus was noted from any vessels and backbleeding was: limited.  The bypass conduit was allowed to bleed in an antegrade fashion.  The bleeding was: excellent.  The anastomosis was completed in the usual fashion.  At this point, all incisions were washed out and Avitene was placed into both incisions.  The continuous doppler exam demonstrated distally: dopplerable anterior tibial artery and peroneal artery.  At this point, bleeding in both incisions were controlled with electrocautery and suture repair with a 6-0 Prolene.  There was no further  bleeding from either anastomosis but there was active bleeding from an injury from the Myerding retractor.  This was controlled with electrocautery.  At this point, there was not further active bleeding in any incision.  I washed out both incisions.  The calf was repaired with interrupted deep sutures to reapproximate the deep muscles and a running stitch of 3-0 Vicryl in the subcutaneous tissue.  The skin was reapproximated with a running subcuticular of 4-0 Monocryl.  After cleaning and drying the skin, the skin was reinforced with Dermabond.  Attention was turned to the groin.  The groin was repaired with a double layer of 2-0 Vicryl immediately superficial to  the bypass conduit.  The superficial subcutaneous tissue was reapproximated with a double layer of 3-0 Vicryl.  The skin was reapproximated with a running subcuticular of 4-0 Monocryl.  The skin was cleaned, dried, and reinforced with Dermabond.  At the end of the case, I verified that there was a strong monophasic anterior tibial artery and dampened monophasic peroneal artery signals.   COMPLICATIONS: none  CONDITION: stable   Adele Barthel, MD [facility] [place] Office: [phone] Pager: [phone]  11/28/2015, 12:15 PM

## 2015-11-28 NOTE — Progress Notes (Signed)
  Day of Surgery Note    Subjective:  No complaints  Filed Vitals:   11/28/15 1249 11/28/15 1300  BP:    Pulse: 80 63  Temp:    Resp: 16 20    Incisions:   Clean and dry without hematoma Extremities:  Brisk doppler flow right AT, PT, and peroneal Cardiac:  regular Lungs:  Non labored   Assessment/Plan:  This is a 65 y.o. male who is s/p  1. Right common femoral artery to below-the-knee popliteal artery bypass with Propaten 2. Construction of Miller cuff on below-the-knee popliteal artery  -pt with patent bypass graft with brisk doppler flow right peroneal, AT, and PT -to 3 south when bed available   Leontine Locket, PA-C 11/28/2015 1:12 PM

## 2015-11-28 NOTE — Progress Notes (Signed)
Raquel Sarna watkins resumed Surveyor, [employer]

## 2015-11-28 NOTE — Interval H&P Note (Signed)
[facility] [place]  History and Physical Update  The patient was interviewed and re-examined.  The patient's previous History and Physical has been reviewed and is unchanged from my consult except for: interval angiogram.  The patient will need a right femoropopliteal bypass to help heal a great toe amputation.  The risk, benefits, and alternative for bypass operations were discussed with the patient.  The patient is aware the risks include but are not limited to: bleeding, infection, myocardial infarction, stroke, limb loss, nerve damage, need for additional procedures in the future, wound complications, and inability to complete the bypass.  The patient is aware of these risks and agreed to proceed.   Adele Barthel, MD [facility] [place] Office: [phone] Pager: [phone]  11/28/2015, 6:57 AM

## 2015-11-28 NOTE — Anesthesia Postprocedure Evaluation (Signed)
Anesthesia Post Note  Patient: WEAVER SECRIST  Procedure(s) Performed: Procedure(s) (LRB): RIGHT  FEMORAL-POPLITEAL ARTERY BYPASS GRAFT AND CONSTRUCTION OF MILLER CUFF USING 6 MM X 80 CM PROPATEN  GRAFT  (Right)  Patient location during evaluation: PACU Anesthesia Type: General Level of consciousness: awake and alert Pain management: pain level controlled Vital Signs Assessment: post-procedure vital signs reviewed and stable Respiratory status: spontaneous breathing, nonlabored ventilation, respiratory function stable and patient connected to nasal cannula oxygen Cardiovascular status: blood pressure returned to baseline and stable Postop Assessment: no signs of nausea or vomiting Anesthetic complications: no    Last Vitals:  Filed Vitals:   11/28/15 1415 11/28/15 1430  BP: 134/72 150/74  Pulse: 93   Temp:  36.5 C  Resp: 14 13    Last Pain:  Filed Vitals:   11/28/15 1444  PainSc: Asleep                 Effie Berkshire

## 2015-11-29 ENCOUNTER — Encounter (HOSPITAL_COMMUNITY): Payer: Self-pay | Admitting: Vascular Surgery

## 2015-11-29 ENCOUNTER — Encounter (HOSPITAL_COMMUNITY): Payer: No Typology Code available for payment source

## 2015-11-29 ENCOUNTER — Inpatient Hospital Stay (HOSPITAL_COMMUNITY): Payer: Medicare Other

## 2015-11-29 DIAGNOSIS — I7025 Atherosclerosis of native arteries of other extremities with ulceration: Secondary | ICD-10-CM

## 2015-11-29 LAB — BASIC METABOLIC PANEL
ANION GAP: 7 (ref 5–15)
BUN: 14 mg/dL (ref 6–20)
CALCIUM: 8.5 mg/dL — AB (ref 8.9–10.3)
CO2: 25 mmol/L (ref 22–32)
CREATININE: 0.69 mg/dL (ref 0.61–1.24)
Chloride: 108 mmol/L (ref 101–111)
GFR calc Af Amer: >60 mL/min (ref >60)
GFR calc non Af Amer: >60 mL/min (ref >60)
GLUCOSE: 98 mg/dL (ref 65–99)
Potassium: 3.9 mmol/L (ref 3.5–5.1)
Sodium: 140 mmol/L (ref 135–145)

## 2015-11-29 LAB — GLUCOSE, CAPILLARY
GLUCOSE-CAPILLARY: 75 mg/dL (ref 65–99)
GLUCOSE-CAPILLARY: 104 mg/dL — AB (ref 65–99)

## 2015-11-29 LAB — CBC
HEMATOCRIT: 39.6 % (ref 39.0–52.0)
Hemoglobin: 13.3 g/dL (ref 13.0–17.0)
MCH: 31.5 pg (ref 26.0–34.0)
MCHC: 33.6 g/dL (ref 30.0–36.0)
MCV: 93.8 fL (ref 78.0–100.0)
PLATELETS: 211 10*3/uL (ref 150–400)
RBC: 4.22 MIL/uL (ref 4.22–5.81)
RDW: 13.2 % (ref 11.5–15.5)
WBC: 11 10*3/uL — ABNORMAL HIGH (ref 4.0–10.5)

## 2015-11-29 NOTE — Evaluation (Signed)
Occupational Therapy Evaluation Patient Details Name: Adam [name] MRN: [id number] DOB: 1951/07/29 Today's Date: 11/29/2015    History of Present Illness Pt is a 65 y/o M admitted w/ PVD w/ Rt great toe ulcer.  Now s/p Rt fem-pop artery bypass. Pt's PMH includes DM, and hernia repair.   Clinical Impression   Pt was independent prior to admission.  Presents with R great toe pain and impaired balance interfering with ability to perform ADL and ADL transfers.  Will follow acutely to address LB bathing and dressing, standing ADL and toilet and shower transfers. Do not anticipate pt will need post acute OT.   Follow Up Recommendations  No OT follow up    Equipment Recommendations  None recommended by OT    Recommendations for Other Services       Precautions / Restrictions Precautions Precautions: Fall Precaution Comments: watch O2 sats, Rt great toe ulcer Restrictions Weight Bearing Restrictions: No      Mobility Bed Mobility Overal bed mobility: Needs Assistance Bed Mobility: Sit to Supine      Sit to supine: Supervision   General bed mobility comments: no physical assist for sit to supine  Transfers Overall transfer level: Needs assistance Equipment used: Rolling walker (2 wheeled) Transfers: Sit to/from Stand Sit to Stand: Supervision         General transfer comment: from EOB and toilet    Balance Overall balance assessment: Needs assistance Sitting-balance support: Feet supported;No upper extremity supported Sitting balance-Leahy Scale: Good     Standing balance support: Bilateral upper extremity supported;During functional activity Standing balance-Leahy Scale: Fair                              ADL Overall ADL's : Needs assistance/impaired Eating/Feeding: Independent;Sitting   Grooming: Wash/dry hands;Standing;Supervision/safety   Upper Body Bathing: Set up;Sitting   Lower Body Bathing: Minimal assistance;Sit to/from stand    Upper Body Dressing : Set up;Sitting   Lower Body Dressing: Minimal assistance;Sit to/from stand   Toilet Transfer: Supervision/safety;Ambulation;RW;Comfort height toilet   Toileting- Clothing Manipulation and Hygiene: Supervision/safety;Sit to/from stand Toileting - Clothing Manipulation Details (indicate cue type and reason): performed pericare with lateral lean     Functional mobility during ADLs: Supervision/safety;Rolling walker General ADL Comments: Pt limited by R foot pain.     Vision     Perception     Praxis      Pertinent Vitals/Pain Pain Assessment: Faces Pain Score: 8  Faces Pain Scale: Hurts whole lot Pain Location: R great toe Pain Descriptors / Indicators: Aching;Guarding;Grimacing Pain Intervention(s): Limited activity within patient's tolerance;Monitored during session;Premedicated before session;Repositioned     Hand Dominance Right   Extremity/Trunk Assessment Upper Extremity Assessment Upper Extremity Assessment: Overall WFL for tasks assessed   Lower Extremity Assessment Lower Extremity Assessment: Defer to PT evaluation RLE Deficits / Details: limited strength as expected s/p Rt fem pop bypass RLE Sensation: decreased light touch       Communication Communication Communication: No difficulties   Cognition Arousal/Alertness: Awake/alert Behavior During Therapy: WFL for tasks assessed/performed Overall Cognitive Status: Within Functional Limits for tasks assessed                     General Comments       Exercises    Shoulder Instructions      Home Living Family/patient expects to be discharged to:: Private residence Living Arrangements: Spouse/significant other Available Help at Discharge: Family;Available 24  hours/day Type of Home: House Home Access: Stairs to enter [employer] of Steps: 3 Entrance Stairs-Rails: Left;Right Home Layout: Multi-level Alternate Level Stairs-Number of Steps: 12 Alternate Level  Stairs-Rails: Left Bathroom Shower/Tub: Occupational psychologist: Handicapped height     Home Equipment: Shower seat - built in   Additional Comments: Pt has 2 steps in garage w/o rails, has 3 steps in front of the house w/ Lt and Rt rails, and has 1 flight to get upstairs to his bedroom w/ Lt rail      Prior Functioning/Environment Level of Independence: Independent        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    OT Diagnosis: Generalized weakness;Acute pain   OT Problem List: Decreased strength;Decreased activity tolerance;Impaired balance (sitting and/or standing);Decreased knowledge of use of DME or AE;Pain   OT Treatment/Interventions: Self-care/ADL training;DME and/or AE instruction;Patient/family education    OT Goals(Current goals can be found in the care plan section) Acute Rehab OT Goals Patient Stated Goal: to get back to independence and playing golf OT Goal Formulation: With patient Time For Goal Achievement: 12/06/15 Potential to Achieve Goals: Good ADL Goals Pt Will Perform Grooming: with modified independence;standing Pt Will Perform Lower Body Bathing: with modified independence;sit to/from stand Pt Will Perform Lower Body Dressing: with modified independence;sit to/from stand Pt Will Transfer to Toilet: with modified independence;ambulating Pt Will Perform Toileting - Clothing Manipulation and hygiene: with modified independence;sit to/from stand Pt Will Perform Tub/Shower Transfer: Shower transfer;ambulating;shower seat;rolling walker;with supervision  OT Frequency: Min 2X/week   Barriers to D/C:            Co-evaluation              End of Session Equipment Utilized During Treatment: Gait belt;Rolling walker  Activity Tolerance: Patient tolerated treatment well Patient left: in bed;with call bell/phone within reach   Time: 1413-1426 OT Time Calculation (min): 13 min Charges:  OT General Charges $OT Visit: 1  Procedure OT Evaluation $OT Eval Moderate Complexity: 1 Procedure G-Codes:    Malka So 11/29/2015, 2:52 PM  [phone]

## 2015-11-29 NOTE — Progress Notes (Signed)
UR COMPLETED  

## 2015-11-29 NOTE — Care Management Note (Signed)
Case Management Note  Patient Details  Name: KAYDEN GUSTAFSON MRN: [id number] Date of Birth: May 15, 1951  Subjective/Objective:             Admitted with PVD, s/p  Right common femoral artery to below-the-knee popliteal artery bypass 11/28/15.   Action/Plan:   Awaiting PT/OT evaluations....  CM to f/u with d/c needs.  Expected Discharge Date:                  Expected Discharge Plan:  Home/Self Care  In-House Referral:     Discharge planning Services  CM Consult  Post Acute Care Choice:    Choice offered to:     DME Arranged:    DME Agency:     HH Arranged:    HH Agency:     Status of Service:  In process, will continue to follow  Medicare Important Message Given:    Date Medicare IM Given:    Medicare IM give by:    Date Additional Medicare IM Given:    Additional Medicare Important Message give by:     If discussed at [place] of Stay Meetings, dates discussed:    Additional Comments: Male Meath (Spouse)  [phone]  Whitman [place], [place] [phone] 11/29/2015, 2:15 PM

## 2015-11-29 NOTE — Progress Notes (Addendum)
  [facility] Progress Note  Subjective  - POD #1  Soreness with incisions. Pain with right great toe wound.   Objective Filed Vitals:   11/29/15 0013 11/29/15 0502  BP: 120/62 125/76  Pulse: 72 61  Temp: 98.4 F (36.9 C) 98 F (36.7 C)  Resp: 22     Intake/Output Summary (Last 24 hours) at 11/29/15 0734 Last data filed at 11/29/15 0600  Gross per 24 hour  Intake   3780 ml  Output   3165 ml  Net    615 ml    Right groin and below knee incisions clean, dry and intact. No erythema.  Monophasic right AT doppler signal.   Assessment/Planning: 65 y.o. male is s/p: 1. Right common femoral artery to below-the-knee popliteal artery bypass with Propaten  2. Construction of Miller cuff on below-the-knee popliteal artery  1 Day Post-Op   Bypass is patent.  Incisions intact.  D/c foley and a-line Ambulate. Transfer to 2W.  DVT prophylaxis: lovenox  Adam [name] 11/29/2015 7:34 AM --  Laboratory CBC    Component Value Date/Time   WBC 11.0* 11/29/2015 0529   HGB 13.3 11/29/2015 0529   HCT 39.6 11/29/2015 0529   PLT 211 11/29/2015 0529    BMET    Component Value Date/Time   NA 140 11/29/2015 0529   K 3.9 11/29/2015 0529   CL 108 11/29/2015 0529   CO2 25 11/29/2015 0529   GLUCOSE 98 11/29/2015 0529   BUN 14 11/29/2015 0529   CREATININE 0.69 11/29/2015 0529   CREATININE 0.82 11/22/2015 0001   CALCIUM 8.5* 11/29/2015 0529   GFRNONAA >60 11/29/2015 0529   GFRAA >60 11/29/2015 0529    COAG Lab Results  Component Value Date   INR 1.13 11/26/2015   No results found for: PTT  Antibiotics Anti-infectives    Start     Dose/Rate Route Frequency Ordered Stop   11/28/15 2045  cefUROXime (ZINACEF) 1.5 g in dextrose 5 % 50 mL IVPB     1.5 g 100 mL/hr over 30 Minutes Intravenous Every 12 hours 11/28/15 2043 11/29/15 2044   11/28/15 0800  cefUROXime (ZINACEF) 1.5 g in dextrose 5 % 50 mL IVPB     1.5 g 100 mL/hr over 30 Minutes Intravenous  To [facility] 11/27/15 1034 11/28/15 0830       Adam Jock, PA-C [facility] Office: [phone] Pager: [phone] 11/29/2015 7:34 AM  Addendum  I have independently interviewed and examined the patient, and I agree with the physician assistant's findings.  Good pedal signals.  Dopplerable DP is hopeful.  Will check on R great toe tomorrow.  If it has demarcated, possible toe amputation on Saturday  Adele Barthel, MD [facility] [place] Office: [phone] Pager: [phone]  11/29/2015, 8:55 AM

## 2015-11-29 NOTE — Progress Notes (Signed)
VASCULAR LAB PRELIMINARY  ARTERIAL  ABI completed:    RIGHT    LEFT    PRESSURE WAVEFORM  PRESSURE WAVEFORM  BRACHIAL 154 Triphasic BRACHIAL 166 Triphasic  AT 84 Biphasic AT  Not found  PT 138 Biphasic PT 104 Biphasic     PER  Not found    RIGHT 0.83LEFT  ABI 0.83 0.63   Right ABI indicates a mild reduction in arterial flow post operative at rest. Left ABI indicates a moderate reduction in arterial flow in the posterior tibial artery. There was no flow detected in the peroneal or anterior tibial arteries as was also noted in the pre operative study at [facility] 11/23/2015  Adam [name], [place], [facility] 11/29/2015, 3:58 PM

## 2015-11-29 NOTE — Evaluation (Signed)
Physical Therapy Evaluation Patient Details Name: Adam [name] MRN: [id number] DOB: [birth year] Today's Date: 11/29/2015   History of Present Illness  Pt is a 65 y/o M admitted w/ PVD w/ Rt great toe ulcer.  Now s/p Rt fem-pop artery bypass. Pt's PMH includes DM, and hernia repair.  Clinical Impression  Patient is s/p above surgery resulting in functional limitations due to the deficits listed below (see PT Problem List). Mr. Hurless was Ind PTA and will have wife at home 24/7 if needed.  He ambulated at supervision level, WB through RW to offload Rt LE.  He has stairs to enter his home as well as a full flight to get to his bedroom upstairs. Patient will benefit from skilled PT to increase their independence and safety with mobility to allow discharge to the venue listed below.       Follow Up Recommendations Home health PT;Supervision for mobility/OOB    Equipment Recommendations  Rolling walker with 5" wheels    Recommendations for Other Services OT consult     Precautions / Restrictions Precautions Precautions: Fall Precaution Comments: watch O2 sats, Rt great toe ulcer Restrictions Weight Bearing Restrictions: No      Mobility  Bed Mobility Overal bed mobility: Needs Assistance Bed Mobility: Supine to Sit     Supine to sit: Supervision     General bed mobility comments: Cues for technique, pt requires increased time and uses bed rail  Transfers Overall transfer level: Needs assistance Equipment used: Rolling walker (2 wheeled) Transfers: Sit to/from Stand Sit to Stand: Min guard         General transfer comment: Cues for technique using RW and correct hand placment during sit<>stand.  Pt slow to stand initially.    Ambulation/Gait Ambulation/Gait assistance: Supervision Ambulation Distance (Feet): 150 Feet Assistive device: Rolling walker (2 wheeled) Gait Pattern/deviations: Step-through pattern;Decreased stride length;Antalgic;Decreased weight shift to  right;Decreased stance time - right   Gait velocity interpretation: Below normal speed for age/gender General Gait Details: Pt WB through Bil UEs to offload Rt LE.  Cues for upright posture and for pursed lip breathing as SpO2 87% on RA.  Standing rest break x1 due to pain.  Stairs            Wheelchair Mobility    Modified Rankin (Stroke Patients Only)       Balance Overall balance assessment: Needs assistance Sitting-balance support: Feet supported;No upper extremity supported Sitting balance-Leahy Scale: Good     Standing balance support: Bilateral upper extremity supported;During functional activity Standing balance-Leahy Scale: Fair                               Pertinent Vitals/Pain Pain Assessment: 0-10 Pain Score: 8  Pain Location: Rt great toe and groin Pain Descriptors / Indicators: Aching;Tightness;Grimacing;Guarding Pain Intervention(s): Limited activity within patient's tolerance;Monitored during session    [place] expects to be discharged to:: Private residence Living Arrangements: Spouse/significant other Available Help at Discharge: Family;Available 24 hours/day Type of Home: House Home Access: Stairs to enter Entrance Stairs-Rails: Chemical engineer of Steps: 3 Home Layout: Multi-level (3 stories, bedroom on 2nd floor)   Additional Comments: Pt has 2 steps in garage w/o rails, has 3 steps in front of the house w/ Lt and Rt rails, and has 1 flight to get upstairs to his bedroom w/ Lt rail    Prior Function Level of Independence: Independent  Hand Dominance        Extremity/Trunk Assessment   Upper Extremity Assessment: Defer to OT evaluation           Lower Extremity Assessment: RLE deficits/detail RLE Deficits / Details: limited strength as expected s/p Rt fem pop bypass       Communication   Communication: No difficulties  Cognition Arousal/Alertness:  Awake/alert Behavior During Therapy: WFL for tasks assessed/performed Overall Cognitive Status: Within Functional Limits for tasks assessed                      General Comments      Exercises General Exercises - Lower Extremity Ankle Circles/Pumps: AROM;Both;10 reps;Supine Long Arc Quad: AROM;Both;10 reps;Seated Other Exercises Other Exercises: Encouraged pt to ambulate in hallway w/ nursing staff at least 3x/day      Assessment/Plan    PT Assessment Patient needs continued PT services  PT Diagnosis Difficulty walking;Acute pain   PT Problem List Decreased strength;Decreased range of motion;Decreased balance;Decreased knowledge of use of DME;Decreased safety awareness;Impaired sensation;Pain  PT Treatment Interventions DME instruction;Gait training;Stair training;Functional mobility training;Therapeutic activities;Therapeutic exercise;Balance training;Neuromuscular re-education;Patient/family education;Modalities   PT Goals (Current goals can be found in the Care Plan section) Acute Rehab PT Goals Patient Stated Goal: to get back to independence and playing golf PT Goal Formulation: With patient Time For Goal Achievement: 12/08/15 Potential to Achieve Goals: Good    Frequency Min 3X/week   Barriers to discharge Inaccessible home environment Steps to enter home and full flight inside home to get to bedroom    Co-evaluation               End of Session Equipment Utilized During Treatment: Gait belt Activity Tolerance: Patient limited by pain;Patient tolerated treatment well Patient left: in bed;Other (comment) (sitting EOB w/ OT in room) Nurse Communication: Mobility status         Time: [id number] PT Time Calculation (min) (ACUTE ONLY): 29 min   Charges:   PT Evaluation $PT Eval Moderate Complexity: 1 Procedure     PT G Codes:       Joslyn Hy PT, DPT [phone] Pager: [phone] 11/29/2015, 2:33 PM

## 2015-11-30 MED ORDER — DEXTROSE 5 % IV SOLN
1.5000 g | INTRAVENOUS | Status: DC
  Filled 2015-11-30: qty 1.5

## 2015-11-30 MED ORDER — CEFUROXIME SODIUM 1.5 G IJ SOLR
1.5000 g | INTRAMUSCULAR | Status: AC
  Administered 2015-12-01: 1.5 g via INTRAVENOUS
  Filled 2015-11-30 (×2): qty 1.5

## 2015-11-30 NOTE — Progress Notes (Signed)
Physical Therapy Treatment Patient Details Name: Adam [name] MRN: [id number] DOB: 10-Jun-1951 Today's Date: 11/30/2015    History of Present Illness Pt is a 65 y/o M admitted w/ PVD w/ Rt great toe ulcer.  Now s/p Rt fem-pop artery bypass. Pt's PMH includes DM, and hernia repair..  Pt tentatively for R great toe amputation 1/14.    PT Comments    Moving well.  Emphasis on transfer safety/sequencing technique to lessen WB on R LE.  Discussed not waiting for PT after surgery when it is appropriate to get up and OOB.  Follow same sequencing with/without post of shoe.  Discussed also mobility at NWB if non weight bearing is ordered.  Demonstated technique.  Follow Up Recommendations  Home health PT;Supervision for mobility/OOB     Equipment Recommendations  Rolling walker with 5" wheels    Recommendations for Other Services       Precautions / Restrictions Precautions Precautions:  (minimal risk)    Mobility  Bed Mobility Overal bed mobility: Needs Assistance Bed Mobility: Supine to Sit;Sit to Supine     Supine to sit: Modified independent (Device/Increase time) Sit to supine: Modified independent (Device/Increase time)      Transfers Overall transfer level: Needs assistance Equipment used: Rolling walker (2 wheeled) Transfers: Sit to/from Stand Sit to Stand: Min guard         General transfer comment: cues for hand placement and prep for standing  Ambulation/Gait Ambulation/Gait assistance: Min guard Ambulation Distance (Feet): 140 Feet Assistive device: Rolling walker (2 wheeled) Gait Pattern/deviations: Step-to pattern     General Gait Details: cues for best sequencing and loading heel only on the R foot.   Stairs            Wheelchair Mobility    Modified Rankin (Stroke Patients Only)       Balance Overall balance assessment: Needs assistance Sitting-balance support: No upper extremity supported Sitting balance-Leahy Scale: Good      Standing balance support: No upper extremity supported Standing balance-Leahy Scale: Fair Standing balance comment: initally unable to bear full weight, but more able with time up.                    Cognition Arousal/Alertness: Awake/alert Behavior During Therapy: WFL for tasks assessed/performed Overall Cognitive Status: Within Functional Limits for tasks assessed                      Exercises General Exercises - Lower Extremity Hip ABduction/ADduction: AROM;Both;5 reps;Supine Straight Leg Raises: AROM;AAROM;Both;10 reps;Supine Hip Flexion/Marching: AROM;Strengthening;Both;10 reps;Supine    General Comments        Pertinent Vitals/Pain Pain Assessment: 0-10 Pain Score: 10-Worst pain ever Faces Pain Scale: Hurts whole lot Pain Location: r great toe Pain Descriptors / Indicators: Burning;Tightness;Throbbing Pain Intervention(s): Limited activity within patient's tolerance;Premedicated before session    Home Living                      Prior Function            PT Goals (current goals can now be found in the care plan section) Acute Rehab PT Goals PT Goal Formulation: With patient Time For Goal Achievement: 12/08/15 Potential to Achieve Goals: Good Progress towards PT goals: Progressing toward goals    Frequency  Min 3X/week    PT Plan Current plan remains appropriate    Co-evaluation             End  of Session   Activity Tolerance: Patient limited by pain;Patient tolerated treatment well Patient left: in bed;with call bell/phone within reach     Time: 1551-1609 PT Time Calculation (min) (ACUTE ONLY): 18 min  Charges:  $Gait Training: 8-22 mins                    G Codes:      Leopold Smyers, Tessie Fass 11/30/2015, 5:40 PM 11/30/2015  Donnella Sham, [place] [phone]  (pager)

## 2015-11-30 NOTE — Progress Notes (Addendum)
[facility] Progress Note  Subjective  - POD #2  Asking if he can have his toe amputated today.   Objective Filed Vitals:   11/30/15 0538 11/30/15 0626  BP: 175/84 153/85  Pulse: 72 61  Temp: 99.3 F (37.4 C)   Resp: 20     Intake/Output Summary (Last 24 hours) at 11/30/15 1043 Last data filed at 11/30/15 0125  Gross per 24 hour  Intake    350 ml  Output   1175 ml  Net   -825 ml   Incisions healing well.  Right foot well perfused.  Dressing clean.    RIGHT    LEFT    PRESSURE WAVEFORM  PRESSURE WAVEFORM  BRACHIAL 154 Triphasic BRACHIAL 166 Triphasic  AT 84 Biphasic AT  Not found  PT 138 Biphasic PT 104 Biphasic     PER  Not found    RIGHT 0.83LEFT  ABI 0.83 0.63        Assessment/Planning: 65 y.o. male is s/p:  1. Right common femoral artery to below-the-knee popliteal artery bypass with Propaten  2. Construction of Miller cuff on below-the-knee popliteal artery  2 Days Post-Op   Significant improvement in ABIs post-op. Previously right 0.49 with monophasic waveforms.  For right great toe amputation tomorrow. Obtain consent. NPO past midnight.   Alvia Grove 11/30/2015 10:43 AM --  Laboratory CBC    Component Value Date/Time   WBC 11.0* 11/29/2015 0529   HGB 13.3 11/29/2015 0529   HCT 39.6 11/29/2015 0529   PLT 211 11/29/2015 0529    BMET    Component Value Date/Time   NA 140 11/29/2015 0529   K 3.9 11/29/2015 0529   CL 108 11/29/2015 0529   CO2 25 11/29/2015 0529   GLUCOSE 98 11/29/2015 0529   BUN 14 11/29/2015 0529   CREATININE 0.69 11/29/2015 0529   CREATININE 0.82 11/22/2015 0001   CALCIUM 8.5* 11/29/2015 0529   GFRNONAA >60 11/29/2015 0529   GFRAA >60 11/29/2015 0529    COAG Lab Results  Component Value Date   INR 1.13 11/26/2015   No results found for: PTT  Antibiotics Anti-infectives    Start     Dose/Rate Route Frequency Ordered Stop   12/01/15 0600   cefUROXime (ZINACEF) 1.5 g in dextrose 5 % 50 mL IVPB     1.5 g 100 mL/hr over 30 Minutes Intravenous To ShortStay Surgical 11/30/15 0838 12/02/15 0600   11/28/15 2045  cefUROXime (ZINACEF) 1.5 g in dextrose 5 % 50 mL IVPB     1.5 g 100 mL/hr over 30 Minutes Intravenous Every 12 hours 11/28/15 2043 11/29/15 0906   11/28/15 0800  cefUROXime (ZINACEF) 1.5 g in dextrose 5 % 50 mL IVPB     1.5 g 100 mL/hr over 30 Minutes Intravenous To ShortStay Surgical 11/27/15 1034 11/28/15 0830       Virgina Jock, PA-C [facility] Office: [phone] Pager: [phone] 11/30/2015 10:43 AM   Addendum  I have independently interviewed and examined the patient, and I agree with the physician assistant's findings.  Incisions c/d/i.  R great toe demonstrates ischemia of distal phalange without frank gangrene.  ABI demonstrate significant improvement despite have significant distal tibial disease.   Because of sx, I suspect he should just go ahead a proceed with R great toe amputation.   I think the right great toe will demarcate in the mid-proximal phalange which would require a R great toe amputation anyway.   - pt  scheduled for R great toe amp tomorrow  Adele Barthel, MD [facility] [place] Office: [phone] Pager: [phone]  11/30/2015, 11:30 AM

## 2015-11-30 NOTE — Procedures (Signed)
RT inspected hospital CPAP machine and pt's home tubing and mask.  CPAP machine set to 5 cmH2O.  Pt will placed himself of the machine when ready for the night.  Pt instructed to call RT if assistance is needed.

## 2015-11-30 NOTE — Care Management Note (Signed)
Case Management Note Previous CM note initiated by Whitman Hero RN., CM  Patient Details  Name: Adam [name] MRN: [id number] Date of Birth: [birth year]  Subjective/Objective:             Admitted with PVD, s/p  Right common femoral artery to below-the-knee popliteal artery bypass 11/28/15.   Action/Plan:   Awaiting PT/OT evaluations....  CM to f/u with d/c needs.  Expected Discharge Date:                  Expected Discharge Plan:  Home/Home Health  In-House Referral:     Discharge planning Services  CM Consult  Post Acute Care Choice:  Home Health, Durable Medical Equipment Choice offered to:  Patient  DME Arranged:  Walker rolling DME Agency:  [place]:  PT [facility] Agency:  [place]  Status of Service:  In process, will continue to follow  Medicare Important Message Given:    Date Medicare IM Given:    Medicare IM give by:    Date Additional Medicare IM Given:    Additional Medicare Important Message give by:     If discussed at [place] of Stay Meetings, dates discussed:    Additional Comments: Adam [name] (Spouse)  [phone]  11/30/15- Adam Gibbons RN, BSN- per PT recommendations HH and DME orders placed- spoke with pt and wife at bedside- agreeable with [facility]- list of [place] agencies provided for [facility]- per choice they would like to use [facility]- for HH-PT- referral called to [place] with [facility] for [place] neeeds- [name] notified of RW need for discharge- to be delivered to room prior to discharge- pt is to have toe amputation tomorrow- wife has concerns regarding stairs in home- have asked PT to address with pt- may need to readdress post amputation.   Adam [name] [place], [place] [phone] 11/30/2015, 3:03 PM

## 2015-12-01 ENCOUNTER — Encounter (HOSPITAL_COMMUNITY): Payer: Self-pay | Admitting: Certified Registered"

## 2015-12-01 ENCOUNTER — Inpatient Hospital Stay (HOSPITAL_COMMUNITY): Payer: Medicare Other | Admitting: Anesthesiology

## 2015-12-01 ENCOUNTER — Encounter (HOSPITAL_COMMUNITY)
Admission: AD | Disposition: A | Discharge status: Home Health | Payer: Self-pay | Source: Ambulatory Visit | Attending: Vascular Surgery

## 2015-12-01 HISTORY — PX: AMPUTATION: SHX166

## 2015-12-01 LAB — BASIC METABOLIC PANEL
ANION GAP: 7 (ref 5–15)
BUN: 15 mg/dL (ref 6–20)
CALCIUM: 8.8 mg/dL — AB (ref 8.9–10.3)
CO2: 27 mmol/L (ref 22–32)
Chloride: 107 mmol/L (ref 101–111)
Creatinine, Ser: 0.75 mg/dL (ref 0.61–1.24)
GFR calc non Af Amer: >60 mL/min (ref >60)
GLUCOSE: 95 mg/dL (ref 65–99)
POTASSIUM: 4 mmol/L (ref 3.5–5.1)
Sodium: 141 mmol/L (ref 135–145)

## 2015-12-01 LAB — CBC
HEMATOCRIT: 37.5 % — AB (ref 39.0–52.0)
HEMOGLOBIN: 12.5 g/dL — AB (ref 13.0–17.0)
MCH: 32 pg (ref 26.0–34.0)
MCHC: 33.3 g/dL (ref 30.0–36.0)
MCV: 95.9 fL (ref 78.0–100.0)
Platelets: 177 10*3/uL (ref 150–400)
RBC: 3.91 MIL/uL — AB (ref 4.22–5.81)
RDW: 13.3 % (ref 11.5–15.5)
WBC: 9 10*3/uL (ref 4.0–10.5)

## 2015-12-01 LAB — HEMOGLOBIN A1C
Hgb A1c MFr Bld: 5.7 % — ABNORMAL HIGH (ref 4.8–5.6)
MEAN PLASMA GLUCOSE: 117 mg/dL

## 2015-12-01 LAB — GLUCOSE, CAPILLARY
Glucose-Capillary: 91 mg/dL (ref 65–99)
Glucose-Capillary: 101 mg/dL — ABNORMAL HIGH (ref 65–99)

## 2015-12-01 SURGERY — AMPUTATION DIGIT
Anesthesia: General | Laterality: Right

## 2015-12-01 MED ORDER — LIDOCAINE HCL (PF) 1 % IJ SOLN
INTRAMUSCULAR | Status: DC | PRN
  Administered 2015-12-01: 5 mL via SUBCUTANEOUS

## 2015-12-01 MED ORDER — LIDOCAINE HCL (CARDIAC) 20 MG/ML IV SOLN
INTRAVENOUS | Status: DC | PRN
  Administered 2015-12-01: 100 mg via INTRAVENOUS

## 2015-12-01 MED ORDER — PROPOFOL 10 MG/ML IV BOLUS
INTRAVENOUS | Status: DC | PRN
  Administered 2015-12-01: 170 mg via INTRAVENOUS
  Administered 2015-12-01: 30 mg via INTRAVENOUS

## 2015-12-01 MED ORDER — 0.9 % SODIUM CHLORIDE (POUR BTL) OPTIME
TOPICAL | Status: DC | PRN
  Administered 2015-12-01: 1000 mL

## 2015-12-01 MED ORDER — MIDAZOLAM HCL 2 MG/2ML IJ SOLN
INTRAMUSCULAR | Status: AC
  Filled 2015-12-01: qty 2

## 2015-12-01 MED ORDER — ONDANSETRON HCL 4 MG/2ML IJ SOLN
INTRAMUSCULAR | Status: AC
  Filled 2015-12-01: qty 2

## 2015-12-01 MED ORDER — PROMETHAZINE HCL 25 MG/ML IJ SOLN: 6.2500 mg | INTRAMUSCULAR | Status: DC | PRN

## 2015-12-01 MED ORDER — LACTATED RINGERS IV SOLN
INTRAVENOUS | Status: DC | PRN
  Administered 2015-12-01: 07:00:00 via INTRAVENOUS

## 2015-12-01 MED ORDER — EPHEDRINE SULFATE 50 MG/ML IJ SOLN
INTRAMUSCULAR | Status: DC | PRN
  Administered 2015-12-01: 10 mg via INTRAVENOUS

## 2015-12-01 MED ORDER — LIDOCAINE HCL (PF) 1 % IJ SOLN
INTRAMUSCULAR | Status: AC
  Filled 2015-12-01: qty 30

## 2015-12-01 MED ORDER — PROPOFOL 10 MG/ML IV BOLUS
INTRAVENOUS | Status: AC
  Filled 2015-12-01: qty 20

## 2015-12-01 MED ORDER — FENTANYL CITRATE (PF) 250 MCG/5ML IJ SOLN
INTRAMUSCULAR | Status: AC
  Filled 2015-12-01: qty 5

## 2015-12-01 MED ORDER — HYDROMORPHONE HCL 1 MG/ML IJ SOLN
INTRAMUSCULAR | Status: AC
  Administered 2015-12-01: 0.5 mg via INTRAVENOUS
  Filled 2015-12-01: qty 1

## 2015-12-01 MED ORDER — HYDROMORPHONE HCL 1 MG/ML IJ SOLN
0.2500 mg | INTRAMUSCULAR | Status: DC | PRN
  Administered 2015-12-01 (×2): 0.5 mg via INTRAVENOUS

## 2015-12-01 MED ORDER — FENTANYL CITRATE (PF) 100 MCG/2ML IJ SOLN
INTRAMUSCULAR | Status: DC | PRN
  Administered 2015-12-01 (×2): 50 ug via INTRAVENOUS
  Administered 2015-12-01: 100 ug via INTRAVENOUS

## 2015-12-01 MED ORDER — MIDAZOLAM HCL 5 MG/5ML IJ SOLN
INTRAMUSCULAR | Status: DC | PRN
  Administered 2015-12-01: 2 mg via INTRAVENOUS

## 2015-12-01 SURGICAL SUPPLY — 32 items
BANDAGE ELASTIC 4 VELCRO ST LF (GAUZE/BANDAGES/DRESSINGS) ×2 IMPLANT
BNDG CONFORM 3 STRL LF (GAUZE/BANDAGES/DRESSINGS) ×2 IMPLANT
BNDG GAUZE ELAST 4 BULKY (GAUZE/BANDAGES/DRESSINGS) ×2 IMPLANT
CANISTER SUCTION 2500CC (MISCELLANEOUS) ×2 IMPLANT
COVER SURGICAL LIGHT HANDLE (MISCELLANEOUS) ×2 IMPLANT
DRAPE EXTREMITY T 121X128X90 (DRAPE) ×2 IMPLANT
DRSG EMULSION OIL 3X3 NADH (GAUZE/BANDAGES/DRESSINGS) ×2 IMPLANT
ELECT REM PT RETURN 9FT ADLT (ELECTROSURGICAL) ×2
ELECTRODE REM PT RTRN 9FT ADLT (ELECTROSURGICAL) ×1 IMPLANT
GAUZE SPONGE 4X4 12PLY STRL (GAUZE/BANDAGES/DRESSINGS) ×2 IMPLANT
GLOVE BIO SURGEON STRL SZ7 (GLOVE) ×3 IMPLANT
GLOVE BIOGEL PI IND STRL 6.5 (GLOVE) IMPLANT
GLOVE BIOGEL PI IND STRL 7.5 (GLOVE) ×1 IMPLANT
GLOVE BIOGEL PI INDICATOR 6.5 (GLOVE) ×1
GLOVE BIOGEL PI INDICATOR 7.5 (GLOVE) ×2
GLOVE SURG SS PI 7.5 STRL IVOR (GLOVE) ×1 IMPLANT
GOWN STRL REUS W/ TWL LRG LVL3 (GOWN DISPOSABLE) ×3 IMPLANT
GOWN STRL REUS W/TWL LRG LVL3 (GOWN DISPOSABLE) ×4
KIT BASIN OR (CUSTOM PROCEDURE TRAY) ×2 IMPLANT
KIT ROOM TURNOVER OR (KITS) ×2 IMPLANT
NS IRRIG 1000ML POUR BTL (IV SOLUTION) ×2 IMPLANT
PACK GENERAL/GYN (CUSTOM PROCEDURE TRAY) ×2 IMPLANT
PAD ARMBOARD 7.5X6 YLW CONV (MISCELLANEOUS) ×4 IMPLANT
SPECIMEN JAR SMALL (MISCELLANEOUS) ×2 IMPLANT
SPONGE GAUZE 4X4 12PLY STER LF (GAUZE/BANDAGES/DRESSINGS) ×1 IMPLANT
SUT ETHILON 3 0 PS 1 (SUTURE) ×4 IMPLANT
SUT VIC AB 3-0 SH 27 (SUTURE) ×2
SUT VIC AB 3-0 SH 27X BRD (SUTURE) IMPLANT
TAPE CLOTH SURG 4X10 WHT LF (GAUZE/BANDAGES/DRESSINGS) ×1 IMPLANT
TUBE ANAEROBIC SPECIMEN COL (MISCELLANEOUS) IMPLANT
UNDERPAD 30X30 INCONTINENT (UNDERPADS AND DIAPERS) ×2 IMPLANT
WATER STERILE IRR 1000ML POUR (IV SOLUTION) ×2 IMPLANT

## 2015-12-01 NOTE — Progress Notes (Signed)
Pt sent to OR. OR charge aware that there was no consent form due to lack of order. They stated they'll get it done down in the short stay.   Will continue to monitor.  Adam [name]

## 2015-12-01 NOTE — Progress Notes (Signed)
Pt requesting pain medication (morphine) for sharp shooting pain that subsides quickly. RN administered PRN and patient able to fall asleep.   Will continue to monitor .  Adam [name]

## 2015-12-01 NOTE — Progress Notes (Signed)
Orthopedic Tech Progress Note Patient Details:  Adam [name] 01-28-1951 [id number]  Ortho Devices Type of Ortho Device: Postop shoe/boot Ortho Device/Splint Interventions: Application   [place] Pink 12/01/2015, 6:13 PM

## 2015-12-01 NOTE — Transfer of Care (Signed)
Immediate Anesthesia Transfer of Care Note  Patient: Adam [name]  Procedure(s) Performed: Procedure(s): AMPUTATION RIGHT GREAT (Right)  Patient Location: PACU  Anesthesia Type:General  Level of Consciousness: alert , oriented and patient cooperative  Airway & Oxygen Therapy: Patient Spontanous Breathing and Patient connected to nasal cannula oxygen  Post-op Assessment: Report given to RN, Post -op Vital signs reviewed and stable and Patient moving all extremities  Post vital signs: Reviewed and stable  Last Vitals:  Filed Vitals:   11/30/15 2110 12/01/15 0511  BP: 145/69 135/75  Pulse: 63 57  Temp: 36.7 C 36.7 C  Resp: 18 17    Complications: No apparent anesthesia complications

## 2015-12-01 NOTE — Anesthesia Postprocedure Evaluation (Signed)
Anesthesia Post Note  Patient: Adam [name]  Procedure(s) Performed: Procedure(s) (LRB): AMPUTATION RIGHT GREAT (Right)  Patient location during evaluation: PACU Anesthesia Type: General Level of consciousness: sedated Pain management: pain level controlled Vital Signs Assessment: post-procedure vital signs reviewed and stable Respiratory status: spontaneous breathing and respiratory function stable Cardiovascular status: stable Anesthetic complications: no    Last Vitals:  Filed Vitals:   12/01/15 0901 12/01/15 0916  BP: 122/70 120/68  Pulse: 63 61  Temp:    Resp: 19 17    Last Pain:  Filed Vitals:   12/01/15 0919  PainSc: [place]

## 2015-12-01 NOTE — Interval H&P Note (Signed)
[facility] [place]  History and Physical Update  The patient was interviewed and re-examined.  The patient's previous History and Physical has been reviewed and is unchanged from my consult except for: interval R fem-pop bypass.  The right great toe has demarcated at this point, so will proceed with R great toe amputation.  I discussed in depth the nature oftoe amputation with the patient, including risks, benefits, and alternatives.  The patient is aware that the risks of toe amputation include but are not limited to: bleeding, infection, myocardial infarction, stroke, death, failure to heal amputation wound, and possible need for more proximal amputation.  The patient is aware of the risks and agrees proceed forward with the procedure.   Adele Barthel, MD [facility] [place] Office: [phone] Pager: [phone]  12/01/2015, 7:25 AM

## 2015-12-01 NOTE — Plan of Care (Signed)
Problem: Pain Managment: Goal: General experience of comfort will improve Outcome: Progressing Patient is getting Q4 PRN medication.

## 2015-12-01 NOTE — Anesthesia Procedure Notes (Signed)
Procedure Name: LMA Insertion Date/Time: 12/01/2015 7:41 AM Performed by: Melina Copa, Orley Lawry R Pre-anesthesia Checklist: Patient identified, Emergency Drugs available, Suction available, Patient being monitored and Timeout performed Patient Re-evaluated:Patient Re-evaluated prior to inductionOxygen Delivery Method: Circle system utilized Preoxygenation: Pre-oxygenation with 100% oxygen Intubation Type: IV induction Ventilation: Mask ventilation without difficulty LMA: LMA inserted LMA Size: 4.0 Number of attempts: 1 Placement Confirmation: positive ETCO2 Tube secured with: Tape Dental Injury: Teeth and Oropharynx as per pre-operative assessment

## 2015-12-01 NOTE — Op Note (Signed)
    OPERATIVE NOTE   PROCEDURE: 1. Right great toe amputation  PRE-OPERATIVE DIAGNOSIS: Symptomatic ischemic right great toe  POST-OPERATIVE DIAGNOSIS: same as above   SURGEON: Adele Barthel, MD  ANESTHESIA: general  ESTIMATED BLOOD LOSS: 50 cc  FINDING(S): 1.  Acceptable bleeding in tissue bed  SPECIMEN(S):  Right great toe  INDICATIONS:   Adam [name] is a 65 y.o. male who presents with ischemic right great toe after recent right femoropopliteal bypass.  This patient has significant tibial disease and while the perfusion to the right great toe is improved.  Several areas appear dead and most of the distal phalange appeared ischemic.  Due to pain issues, the patient wanted to proceed with toe amputation rather than waiting on it to further demarcate.  Risk discussed included: bleeding, infection, and need for more proximal amputation due to inadequate healing.  DESCRIPTION: After obtaining full informed written consent, the patient was brought back to the operating room and placed supine upon the operating table.  The patient received IV antibiotics prior to induction.  After obtaining adequate anesthesia, the patient was prepped and draped in the standard fashion for: right great toe amputation.  I injected 5 cc of 1% lidocaine without epinephrine in a toe block fashion in the right great toe to get additional anesthesia.  I made a racquet incision and then dissected down to the metatarsal-phalanage joint with electrocautery.  The bleeding found in this process was adequate to support healing.  I separated the right great toe specimen from the joint and passed it off the field.  There was adequate laxity in the skin to avoid needing to resect the metatarsal head.  I reapproximated the fascia with 3 3-0 Vicryl horizontal mattress stitches.  The skin was then reapproximated with 3-0 Nylon simple stitches.  The skin was cleaned and dried.  Fluffs were applied to the amputation site and held  in place with sterile bandages and tape.   COMPLICATIONS: none  CONDITION: stable   Adele Barthel, MD [facility] [place] Office: [phone] Pager: [phone]  12/01/2015, 8:37 AM

## 2015-12-01 NOTE — Anesthesia Preprocedure Evaluation (Addendum)
Anesthesia Evaluation  Patient identified by MRN, date of birth, ID band Patient awake    Reviewed: Allergy & Precautions, NPO status , Patient's Chart, lab work & pertinent test results  Airway Mallampati: II  TM Distance: >3 FB Neck ROM: Full    Dental  (+) Partial Upper, Partial Lower   Pulmonary sleep apnea , former smoker,    Pulmonary exam normal        Cardiovascular hypertension, Pt. on medications + Peripheral Vascular Disease  Normal cardiovascular exam     Neuro/Psych negative neurological ROS  negative psych ROS   GI/Hepatic negative GI ROS, Neg liver ROS,   Endo/Other  diabetes, Type 2  Renal/GU negative Renal ROS  negative genitourinary   Musculoskeletal negative musculoskeletal ROS (+)   Abdominal   Peds negative pediatric ROS (+)  Hematology negative hematology ROS (+)   Anesthesia Other Findings   Reproductive/Obstetrics negative OB ROS                            Lab Results  Component Value Date   WBC 9.0 12/01/2015   HGB 12.5* 12/01/2015   HCT 37.5* 12/01/2015   MCV 95.9 12/01/2015   PLT 177 12/01/2015   Lab Results  Component Value Date   CREATININE 0.75 12/01/2015   BUN 15 12/01/2015   NA 141 12/01/2015   K 4.0 12/01/2015   CL 107 12/01/2015   CO2 27 12/01/2015   Lab Results  Component Value Date   INR 1.13 11/26/2015   11/2015 EKG: NSR with PAC's noted.   Anesthesia Physical  Anesthesia Plan  ASA: III  Anesthesia Plan: General   Post-op Pain Management:    Induction: Intravenous  Airway Management Planned: LMA  Additional Equipment: Arterial line  Intra-op Plan:   Post-operative Plan: Extubation in OR  Informed Consent: I have reviewed the patients History and Physical, chart, labs and discussed the procedure including the risks, benefits and alternatives for the proposed anesthesia with the patient or authorized representative who has  indicated his/her understanding and acceptance.   Dental advisory given  Plan Discussed with: CRNA  Anesthesia Plan Comments:        Anesthesia Quick Evaluation

## 2015-12-02 NOTE — Progress Notes (Signed)
Physical Therapy Treatment Patient Details Name: Adam [name] MRN: [id number] DOB: 1951-02-15 Today's Date: 12/02/2015    History of Present Illness Pt is a 65 y/o M admitted w/ PVD w/ Rt great toe ulcer.  Now s/p Rt fem-pop artery bypass. Pt's PMH includes DM, and hernia repair. s/p R great toe amputation 11/29/15    PT Comments    Continues to make good progress, demonstrating excellent mobility with a rolling walker for support. Requests to practice stairs until tomorrow as he was fatigued after ambulatory bout with PT today. Will have assistance from wife as needed at home. Patient will continue to benefit from skilled physical therapy services to further improve independence with functional mobility.   Follow Up Recommendations  No PT follow up;Supervision for mobility/OOB     Equipment Recommendations  Rolling walker with 5" wheels    Recommendations for Other Services       Precautions / Restrictions Precautions Precautions: Fall Required Braces or Orthoses: Other Brace/Splint Other Brace/Splint: post op shoe Rt Restrictions Weight Bearing Restrictions:  (None ordered. Note left for MD.)    Mobility  Bed Mobility Overal bed mobility: Modified Independent             General bed mobility comments: extra time  Transfers Overall transfer level: Needs assistance Equipment used: Rolling walker (2 wheeled) Transfers: Sit to/from Stand Sit to Stand: Supervision         General transfer comment: supervision for safety good stability with RW.  Ambulation/Gait Ambulation/Gait assistance: Supervision Ambulation Distance (Feet): 120 Feet Assistive device: Rolling walker (2 wheeled) Gait Pattern/deviations: Step-to pattern Gait velocity: decreased Gait velocity interpretation: Below normal speed for age/gender General Gait Details: Pt WB through Bil UEs to offload Rt LE.  Good control of RW without loss of balance. Able to step backwards several feet safely.  Declines navigating steps today. Cues for posture.   Stairs Stairs:  (declined)          Engineer, building services Rankin (Stroke Patients Only)       Balance                                    Cognition Arousal/Alertness: Awake/alert Behavior During Therapy: WFL for tasks assessed/performed Overall Cognitive Status: Within Functional Limits for tasks assessed                      Exercises General Exercises - Lower Extremity Ankle Circles/Pumps: AROM;Both;10 reps;Supine Quad Sets: AROM;Right;10 reps;Supine Other Exercises Other Exercises: Encouraged pt to ambulate in hallway w/ nursing staff at least 3x/day    General Comments General comments (skin integrity, edema, etc.): Wants to attempt stairs tomorrow rather than today.      Pertinent Vitals/Pain Pain Assessment: Faces Faces Pain Scale: Hurts little more Pain Location: Rt foot Pain Descriptors / Indicators: Throbbing Pain Intervention(s): Monitored during session    Home Living                      Prior Function            PT Goals (current goals can now be found in the care plan section) Acute Rehab PT Goals Patient Stated Goal: to get back to independence and playing golf PT Goal Formulation: With patient Time For Goal Achievement: 12/08/15 Potential to Achieve Goals: Good Progress towards PT goals: Progressing toward goals  Frequency  Min 3X/week    PT Plan Discharge plan needs to be updated    Co-evaluation             End of Session   Activity Tolerance: Patient tolerated treatment well Patient left: in bed;with call bell/phone within reach;with nursing/sitter in room     Time: 1710-1725 PT Time Calculation (min) (ACUTE ONLY): 15 min  Charges:  $Gait Training: 8-22 mins                    G Codes:      Adam [name] 2015-12-31, 6:58 PM Adam [name] [place], [place]

## 2015-12-02 NOTE — Progress Notes (Signed)
   Daily Progress Note  Assessment/Planning: POD #1 s/p R great toe amputation, POD #4 s/p R CFA to BK pop BPG   Ok to start ambulating with supervision today  Cont PT while in hospital  Home probably tomorrow once ambulating better  Subjective  - 1 Day Post-Op  C/o post-op pain  Objective Filed Vitals:   12/01/15 1100 12/01/15 1411 12/01/15 2007 12/02/15 0352  BP: 141/62 136/61 128/66 122/68  Pulse: 64 62 64 60  Temp:  98.2 F (36.8 C) 99.1 F (37.3 C) 98.2 F (36.8 C)  TempSrc:  Oral Oral Oral  Resp:  17 18 16   Height:      Weight:      SpO2:  100% 100% 98%    Intake/Output Summary (Last 24 hours) at 12/02/15 0757 Last data filed at 12/02/15 0300  Gross per 24 hour  Intake    750 ml  Output    900 ml  Net   -150 ml    PULM  CTAB CV  RRR GI  soft, NTND VASC  Inc c/d/i, R foot dressed, warm foot  Laboratory CBC    Component Value Date/Time   WBC 9.0 12/01/2015 0342   HGB 12.5* 12/01/2015 0342   HCT 37.5* 12/01/2015 0342   PLT 177 12/01/2015 0342    BMET    Component Value Date/Time   NA 141 12/01/2015 0342   K 4.0 12/01/2015 0342   CL 107 12/01/2015 0342   CO2 27 12/01/2015 0342   GLUCOSE 95 12/01/2015 0342   BUN 15 12/01/2015 0342   CREATININE 0.75 12/01/2015 0342   CREATININE 0.82 11/22/2015 0001   CALCIUM 8.8* 12/01/2015 0342   GFRNONAA >60 12/01/2015 0342   GFRAA >60 12/01/2015 [place], MD [facility] : [phone] Pager: [phone]  12/02/2015, 7:57 AM

## 2015-12-03 ENCOUNTER — Encounter (HOSPITAL_COMMUNITY): Payer: Self-pay | Admitting: Vascular Surgery

## 2015-12-03 MED ORDER — OXYCODONE-ACETAMINOPHEN 5-325 MG PO TABS: 1.0000 | ORAL_TABLET | Freq: Four times a day (QID) | ORAL | Status: DC | PRN

## 2015-12-03 NOTE — Progress Notes (Addendum)
  [facility] Progress Note  Subjective   Having some pain with right toe amputation.   Objective Filed Vitals:   12/02/15 2256 12/03/15 0509  BP:  149/67  Pulse: 70 60  Temp:  98.3 F (36.8 C)  Resp: 16 18    Intake/Output Summary (Last 24 hours) at 12/03/15 0759 Last data filed at 12/03/15 0752  Gross per 24 hour  Intake    480 ml  Output    750 ml  Net   -270 ml   Right leg incisions healing well Right foot warm and well perfused Right toe amputation site clean dry and intact. Some erythema around incision.    Assessment/Planning: 65 y.o. male is s/p: right common femoral to below-knee popliteal bypass with propaten POD 5, right great toe amputation POD 2  Incisions and toe amputation site healing well  Ambulating adequately and pain well controlled on po pain meds.  Discharge home today.   Alvia Grove, PA-C 12/03/2015 7:59 AM --  Laboratory CBC    Component Value Date/Time   WBC 9.0 12/01/2015 0342   HGB 12.5* 12/01/2015 0342   HCT 37.5* 12/01/2015 0342   PLT 177 12/01/2015 0342    BMET    Component Value Date/Time   NA 141 12/01/2015 0342   K 4.0 12/01/2015 0342   CL 107 12/01/2015 0342   CO2 27 12/01/2015 0342   GLUCOSE 95 12/01/2015 0342   BUN 15 12/01/2015 0342   CREATININE 0.75 12/01/2015 0342   CREATININE 0.82 11/22/2015 0001   CALCIUM 8.8* 12/01/2015 0342   GFRNONAA >60 12/01/2015 0342   GFRAA >60 12/01/2015 0342    COAG Lab Results  Component Value Date   INR 1.13 11/26/2015   No results found for: PTT  Antibiotics Anti-infectives    Start     Dose/Rate Route Frequency Ordered Stop   12/01/15 0800  [MAR Hold]  cefUROXime (ZINACEF) 1.5 g in dextrose 5 % 50 mL IVPB     (MAR Hold since 12/01/15 0701)   1.5 g 100 mL/hr over 30 Minutes Intravenous To ShortStay Surgical 11/30/15 1701 12/01/15 0727   12/01/15 0600  cefUROXime (ZINACEF) 1.5 g in dextrose 5 % 50 mL IVPB  Status:  Discontinued     1.5 g 100 mL/hr  over 30 Minutes Intravenous To ShortStay Surgical 11/30/15 0838 11/30/15 1701   11/28/15 2045  cefUROXime (ZINACEF) 1.5 g in dextrose 5 % 50 mL IVPB     1.5 g 100 mL/hr over 30 Minutes Intravenous Every 12 hours 11/28/15 2043 11/29/15 0906   11/28/15 0800  cefUROXime (ZINACEF) 1.5 g in dextrose 5 % 50 mL IVPB     1.5 g 100 mL/hr over 30 Minutes Intravenous To ShortStay Surgical 11/27/15 1034 11/28/15 0830       Virgina Jock, PA-C [facility] Office: [phone] Pager: [phone] 12/03/2015 7:59 AM  Addendum  I have independently interviewed and examined the patient, and I agree with the physician assistant's findings.  R great toe amputation incision c/d/i.  Warm right foot.  Ok to D/C home.    Adele Barthel, MD [facility] [place] Office: [phone] Pager: [phone]  12/03/2015, 11:39 AM

## 2015-12-03 NOTE — Progress Notes (Signed)
Pt being discharged home via wheelchair with family. Pt alert and oriented x4. VSS. Pt c/o no pain at this time. No signs of respiratory distress. Education complete and care plans resolved. IV removed with catheter intact and pt tolerated well. No further issues at this time. Pt to follow up with PCP. Eyana Stolze R, RN 

## 2015-12-03 NOTE — Progress Notes (Signed)
Physical Therapy Treatment Patient Details Name: Adam [name] MRN: [id number] DOB: 22-Apr-1951 Today's Date: 12/03/2015    History of Present Illness Pt is a 65 y/o M admitted w/ PVD w/ Rt great toe ulcer.  Now s/p Rt fem-pop artery bypass. Pt's PMH includes DM, and hernia repair. s/p R great toe amputation 11/29/15    PT Comments    After extended work on stairs and walker pt is tired but is capable of handling the stairs with his wife.   Wife was in attendance for training and will expect his HHPT to follow up with him to practice crutches further.  Follow Up Recommendations  Home health PT     Equipment Recommendations  Rolling walker with 5" wheels    Recommendations for Other Services       Precautions / Restrictions Precautions Precautions: Fall Other Brace/Splint: post op shoe Rt Restrictions Weight Bearing Restrictions: Yes Other Position/Activity Restrictions: NWB as he is not specifically ordered    Mobility  Bed Mobility Overal bed mobility: Modified Independent                Transfers Overall transfer level: Needs assistance Equipment used: Rolling walker (2 wheeled);Crutches Transfers: Sit to/from [facility] Sit to Stand: Modified independent (Device/Increase time);Min guard (crutches are more assistance) Stand pivot transfers: Min guard       General transfer comment: supervision if using walker  Ambulation/Gait Ambulation/Gait assistance: Min guard;Min assist Ambulation Distance (Feet): 300 Feet Assistive device: Crutches;Rolling walker (2 wheeled)   Gait velocity: variable Gait velocity interpretation: Below normal speed for age/gender General Gait Details: used both walker and crutches with cues for eliminating WBing on RLE.  Pt is impulsive with crutches although not ready to do so, unsafe   Stairs Stairs: Yes Stairs assistance: Min guard Stair Management: Step to pattern;With crutches Number of Stairs: 3 General  stair comments: pt needs reminders and cues to maintain NWB on RLE with cued pattern forward only  (covered use of crutches with one rail, rail only and walker )  Wheelchair Mobility    Modified Rankin (Stroke Patients Only)       Balance Overall balance assessment: Needs assistance Sitting-balance support: Feet supported Sitting balance-Leahy Scale: Good   Postural control: Posterior lean Standing balance support: Bilateral upper extremity supported Standing balance-Leahy Scale: Fair Standing balance comment: fair- with crutches due to pt decisions about sequence                    Cognition Arousal/Alertness: Awake/alert Behavior During Therapy: WFL for tasks assessed/performed Overall Cognitive Status: Within Functional Limits for tasks assessed                      Exercises      General Comments General comments (skin integrity, edema, etc.): Has options for steps and recommended the crutches to him.  Pt may exercise the sitting scoot up the stairs,      Pertinent Vitals/Pain Pain Assessment: Faces Pain Score: 4  Faces Pain Scale: Hurts little more Pain Location: R foot Pain Descriptors / Indicators: Aching;Operative site guarding Pain Intervention(s): Monitored during session;Premedicated before session    Home Living                      Prior Function            PT Goals (current goals can now be found in the care plan section) Acute Rehab PT Goals Patient  Stated Goal: get up the stairs, get home Progress towards PT goals: Progressing toward goals    Frequency  Min 3X/week    PT Plan Current plan remains appropriate    Co-evaluation             End of Session Equipment Utilized During Treatment: Gait belt Activity Tolerance: Patient tolerated treatment well;Patient limited by pain Patient left: in bed;with call bell/phone within reach;with bed alarm set;with family/visitor present     Time: [id number] PT Time  Calculation (min) (ACUTE ONLY): 41 min  Charges:  $Gait Training: 23-37 mins $Therapeutic Activity: 8-22 mins                    G Codes:      Ramond Dial December 04, 2015, 2:53 PM  Mee Hives, PT MS Acute Rehab Dept. Number: [facility] [phone] and [place] [phone]

## 2015-12-03 NOTE — Progress Notes (Signed)
Occupational Therapy Treatment Patient Details Name: DRUE HUIZINGA MRN: [id number] DOB: 04-01-1951 Today's Date: 12/03/2015    History of present illness Pt is a 65 y/o M admitted w/ PVD w/ Rt great toe ulcer.  Now s/p Rt fem-pop artery bypass. Pt's PMH includes DM, and hernia repair. s/p R great toe amputation 11/29/15   OT comments  Pt with expected post operative pain in R LE, but reports it is manageable.  Pt is eager to go home.  Educated on techniques for shower transfer and for LB dressing. Recommended pt elevate R LE when at rest and to walk within his home once per hour. All questions answered with regard to ADL to pt's satisfaction. Pt will have supervision of his wife initially upon return home.  Follow Up Recommendations  No OT follow up    Equipment Recommendations  None recommended by OT    Recommendations for Other Services      Precautions / Restrictions Precautions Precautions: Fall Other Brace/Splint: post op shoe Rt Restrictions Other Position/Activity Restrictions: WB not specified in orders, pt keeping weight on heel due to pain       Mobility Bed Mobility Overal bed mobility: Modified Independent                Transfers   Equipment used: Rolling walker (2 wheeled)   Sit to Stand: Modified independent (Device/Increase time)         General transfer comment: no physical assist, good stability    Balance                                   ADL Overall ADL's : Needs assistance/impaired     Grooming: Wash/dry hands;Oral care;Standing;Supervision/safety       Lower Body Bathing: Supervison/ safety;Sit to/from stand       Lower Body Dressing: Supervision/safety;Sit to/from stand Lower Body Dressing Details (indicate cue type and reason): instructed to dress R LE first and then L, able to don post op shoe           Tub/Shower Transfer Details (indicate cue type and reason): verbally instructed in technique and walker  placement for shower transfer, pt declined practicing, will have wife available to supervise, has a built in seat Functional mobility during ADLs: Supervision/safety;Rolling walker General ADL Comments: Pt educated to elevate L LE when at rest. Recommended ankle pumps and to walk once per hour when he goes home.      Vision                     Perception     Praxis      Cognition   Behavior During Therapy: WFL for tasks assessed/performed Overall Cognitive Status: Within Functional Limits for tasks assessed                       Extremity/Trunk Assessment               Exercises     Shoulder Instructions       General Comments      Pertinent Vitals/ Pain       Pain Assessment: Faces Faces Pain Scale: Hurts little more Pain Location: R foot Pain Descriptors / Indicators: Operative site guarding;Throbbing Pain Intervention(s): Limited activity within patient's tolerance;Monitored during session;Premedicated before session;Repositioned  Home Living  Prior Functioning/Environment              Frequency Min 2X/week     Progress Toward Goals  OT Goals(current goals can now be found in the care plan section)  Progress towards OT goals: Progressing toward goals  Acute Rehab OT Goals Patient Stated Goal: to get back to independence and playing golf  Plan Discharge plan remains appropriate    Co-evaluation                 End of Session Equipment Utilized During Treatment: Gait belt;Rolling walker   Activity Tolerance Patient tolerated treatment well   Patient Left in bed;with call bell/phone within reach   Nurse Communication          Time: 0905-0920 OT Time Calculation (min): 15 min  Charges: OT General Charges $OT Visit: 1 Procedure OT Treatments $Self Care/Home Management : 8-22 mins  Malka So 12/03/2015, 9:31 AM  [phone]

## 2015-12-03 NOTE — Care Management Note (Addendum)
Case Management Note Previous CM note initiated by Whitman Hero RN., CM  Patient Details  Name: Adam [name] MRN: [id number] Date of Birth: 1951/08/27  Subjective/Objective:             Admitted with PVD, s/p  Right common femoral artery to below-the-knee popliteal artery bypass 11/28/15.   Action/Plan:   Awaiting PT/OT evaluations....  CM to f/u with d/c needs.  Expected Discharge Date:    12/03/15              Expected Discharge Plan:  Home/Home Health  In-House Referral:     Discharge planning Services  CM Consult  Post Acute Care Choice:  Home Health, Durable Medical Equipment Choice offered to:  Patient  DME Arranged:  Walker rolling DME Agency:  [place]:  PT [facility] Agency:  [place]  Status of Service:  Completed, signed off  Medicare Important Message Given:    Date Medicare IM Given:    Medicare IM give by:    Date Additional Medicare IM Given:    Additional Medicare Important Message give by:     If discussed at [place] of Stay Meetings, dates discussed:    Discharge Disposition: Home/Home Health   Additional Comments: [name] (Spouse)  [phone]  12/03/15- Marvetta Gibbons RN, BSN - f/u done with pt and wife- pt has done well post op- still would like to do HH-PT- referral already done with [facility]- call made to [place] with [facility] to notify of d/c today- [name] with [facility] has delivered RW to room. No further CM needs identified.  Update at 1250- notified that pt needs crutches also- order has been placed- call made to ortho tech here in hospital regarding crutches need- they will bring crutches up to room prior to discharge.   11/30/15- Marvetta Gibbons RN, BSN- per PT recommendations HH and DME orders placed- spoke with pt and wife at bedside- agreeable with [place]- list of [place] agencies provided for [facility]- per choice they would like to use [facility]- for HH-PT- referral called to [place] with [facility] for [place] neeeds- [name]  notified of RW need for discharge- to be delivered to room prior to discharge- pt is to have toe amputation tomorrow- wife has concerns regarding stairs in home- have asked PT to address with pt- may need to readdress post amputation.   Marvetta Gibbons [place], [place] [phone] 12/03/2015, 11:49 AM

## 2015-12-04 ENCOUNTER — Telehealth: Payer: Self-pay | Admitting: *Deleted

## 2015-12-04 NOTE — Telephone Encounter (Signed)
Merry Proud from [facility] called and stated that the patient is refusing all home health care.

## 2015-12-05 ENCOUNTER — Telehealth: Payer: Self-pay | Admitting: Family Medicine

## 2015-12-05 NOTE — Telephone Encounter (Signed)
Left message for pt to call. Per JCL needs a diabetic new onset appt. JCL would like to seem him fairly quickly.

## 2015-12-05 NOTE — Telephone Encounter (Signed)
Due to that fact that JCL needs to see this pt quickly, I sent him an email requesting him to call the office.

## 2015-12-06 ENCOUNTER — Encounter: Payer: Self-pay | Admitting: Vascular Surgery

## 2015-12-06 ENCOUNTER — Telehealth: Payer: Self-pay

## 2015-12-06 ENCOUNTER — Ambulatory Visit (INDEPENDENT_AMBULATORY_CARE_PROVIDER_SITE_OTHER): Payer: No Typology Code available for payment source | Admitting: Vascular Surgery

## 2015-12-06 VITALS — BP 142/74 | HR 73 | Temp 99.6°F | Resp 16 | Ht 71.0 in | Wt 185.0 lb

## 2015-12-06 DIAGNOSIS — I739 Peripheral vascular disease, unspecified: Secondary | ICD-10-CM

## 2015-12-06 MED ORDER — OXYCODONE-ACETAMINOPHEN 5-325 MG PO TABS: 1.0000 | ORAL_TABLET | Freq: Four times a day (QID) | ORAL | Status: DC | PRN

## 2015-12-06 NOTE — Discharge Summary (Addendum)
[facility] Discharge Summary  Adam [name] 09/25/1951 65 y.o. male  [id number]  Admission Date: 11/28/2015  Discharge Date: 12/03/2015  Physician: Adele Barthel, MD  Admission Diagnosis: Peripheral vascular disease with right great toe ulcer I70.235 Right great toe gangrene I96  HPI:   This is a 65 y.o. male presented with chief complaint: painful black right great toe. Onset of symptom occurred over last 3 weeks, increasing pain in R great toe with darkening color. The was seen by Dr. Sharol Given yesterday and he had concerns that he had an ischemic toe due PAD. Pain is described as achy, severity 10/10, and associated with rest and movement. Patient has attempted to treat this pain with Tramadol without relief. The patient has intermittentrest pain symptoms. He denies any significant intermittent claudication. He denies any drainage or fever or chills. Atherosclerotic risk factors include: DM, HTN, and prior smoking.  Hospital Course:  The patient was admitted to the hospital and taken to the operating room on 11/28/2015 on right common femoral artery to below-the-knee popliteal artery bypass with Propaten and construction of Miller cuff on below-the-knee popliteal artery. The patient tolerated the procedure well and was transported to the PACU in  stable condition.   POD 1: The patient had good pedal doppler signals. His incisions were healing well.  His right great toe was stable.   POD 2: The patient had significant improvement on his post-op ABIs despite significant distal tibial disease. His right great toe demonstrated ischemia of the distal phalange without frank gangrene. Dr. Bridgett Larsson felt that amputation was necessary given that the toe would continue to demarcate in the mid-proximal phalange and ultimately require amputation anyway.  POD 3: The patient was taken to the OR on 12/01/15 and underwent right great toe amputation. He had acceptable bleeding in the  tissue bed. He tolerated the procedure well and was transported to the recovery room in stable condition.   POD 4: Ambulation with supervision was started today. His incisions were clean, dry and intact.   POD 5: The patient's pain was well controlled and he was ambulating adequately. His incisions and amputation site were healing well. His right foot was well perfused. He was discharged home on POD 5 in good condition.      CBC    Component Value Date/Time   WBC 9.0 12/01/2015 0342   RBC 3.91* 12/01/2015 0342   HGB 12.5* 12/01/2015 0342   HCT 37.5* 12/01/2015 0342   PLT 177 12/01/2015 0342   MCV 95.9 12/01/2015 0342   MCH 32.0 12/01/2015 0342   MCHC 33.3 12/01/2015 0342   RDW 13.3 12/01/2015 0342   LYMPHSABS 2.3 11/22/2015 0001   MONOABS 0.8 11/22/2015 0001   EOSABS 0.3 11/22/2015 0001   BASOSABS 0.1 11/22/2015 0001    BMET    Component Value Date/Time   NA 141 12/01/2015 0342   K 4.0 12/01/2015 0342   CL 107 12/01/2015 0342   CO2 27 12/01/2015 0342   GLUCOSE 95 12/01/2015 0342   BUN 15 12/01/2015 0342   CREATININE 0.75 12/01/2015 0342   CREATININE 0.82 11/22/2015 0001   CALCIUM 8.8* 12/01/2015 0342   GFRNONAA >60 12/01/2015 0342   GFRAA >60 12/01/2015 0342     Discharge Instructions:   The patient is discharged to home with extensive instructions on wound care and progressive ambulation.  They are instructed not to drive or perform any heavy lifting until returning to see the physician in his office.  Discharge Instructions  Call MD for:  redness, tenderness, or signs of infection (pain, swelling, bleeding, redness, odor or green/yellow discharge around incision site)    Complete by:  As directed      Call MD for:  severe or increased pain, loss or decreased feeling  in affected limb(s)    Complete by:  As directed      Call MD for:  temperature >100.5    Complete by:  As directed      Discharge wound care:    Complete by:  As directed   Wash the groin wound  with soap and water daily and pat dry. (No tub bath-only shower)  Then put a dry gauze or washcloth there to keep this area dry daily and as needed.  Do not use Vaseline or neosporin on your incisions.  Only use soap and water on your incisions and then protect and keep dry.  Wash below knee wound and toe amputation site with soap and water and pat dry. You may apply a dressing to your toe. Do not apply any creams or ointments to your incisions     Driving Restrictions    Complete by:  As directed   No driving for 2 weeks     Increase activity slowly    Complete by:  As directed   Walk with assistance use walker as needed with Darco shoe. Weightbearing heel only.  Keep right leg elevated with knee slightly bent above level of the heart when sitting.     Lifting restrictions    Complete by:  As directed   No lifting for 2 weeks     Resume previous diet    Complete by:  As directed            Discharge Diagnosis:  Peripheral vascular disease with right great toe ulcer I70.235 Right great toe gangrene I96  Secondary Diagnosis: Patient Active Problem List   Diagnosis Date Noted  . Atherosclerosis of native arteries of left leg with ulceration of unspecified site ([place]) 11/28/2015  . Atherosclerosis of native arteries of the extremities with ulceration ([place]) 11/26/2015  . Atherosclerosis of native arteries of the extremities with gangrene ([place]) 11/23/2015  . New onset type 2 diabetes mellitus ([place]) 11/22/2015  . Essential hypertension 12/04/2014   Past Medical History  Diagnosis Date  . Diabetes mellitus without complication ([place])   . Hypertension   . Peripheral vascular disease ([place])   . Gangrene ([place]) 11/22/2015    right great toe  . Sleep apnea     uses cpap       Medication List    TAKE these medications        lisinopril-hydrochlorothiazide 20-12.5 MG tablet  Commonly known as:  PRINZIDE,ZESTORETIC  Take 1 tablet by mouth daily.     oxyCODONE-acetaminophen 5-325 MG  tablet  Commonly known as:  PERCOCET/ROXICET  Take 1-2 tablets by mouth every 6 (six) hours as needed for moderate pain.     traMADol 50 MG tablet  Commonly known as:  ULTRAM  Take 1 tablet (50 mg total) by mouth every 8 (eight) hours as needed.        Percocet #30 No Refill  Disposition: Home  Patient's condition: is Good  Follow up: 1. Dr. Bridgett Larsson in 2 weeks   Virgina Jock, PA-C [facility] [phone] 12/06/2015  9:26 AM   Addendum  I have independently interviewed and examined the patient, and I agree with the physician assistant's findings.  This patient underwent  a R CFA to BK pop bypass with propaten due to compromised bilateral GSV.  This was constructed with the a Miller patch to help with patency.  The patient's R great toe was allowed to further demarcate itself.  The patient then went back to the OR for a R 1st toe amputation.  The bleeding was good, so his chances of healing area improved after the bypass despite having compromised runoff vessels.  He will follow up in the office in 2 weeks for suture removal and wound checks.  Adele Barthel, MD [facility] [place] Office: [phone] Pager: [phone]  12/06/2015, 10:43 AM    - For VQI Registry use --- Instructions: Press F2 to tab through selections.  Delete question if not applicable.   Post-op:  Wound infection: No  Graft infection: No  Transfusion: No   New Arrhythmia: No Ipsilateral amputation:Yes, [ x] Minor, toe amputation previously necrotic, [ ]  BKA, [ ]  AKA Discharge patency: [x ] Primary, [ ]  Primary assisted, [ ]  Secondary, [ ]  Occluded Patency judged by: [ x] Dopper only, [ ]  Palpable graft pulse, [ ]  Palpable distal pulse, [ ]  ABI inc. > 0.15, [ ]  Duplex Discharge ABI: R 0.83, L 0.63 D/C Ambulatory Status: Ambulatory with Assistance  Complications: MI: No, [ ]  Troponin only, [ ]  EKG or Clinical CHF: No Resp failure:No, [ ]  Pneumonia, [ ]   Ventilator Chg in renal function: No, [ ]  Inc. Cr > 0.5, [ ]  Temp. Dialysis, [ ]  Permanent dialysis Stroke: No, [ ]  Minor, [ ]  Major Return to OR: No  Reason for return to OR: [ ]  Bleeding, [ ]  Infection, [ ]  Thrombosis, [ ]  Revision  Discharge medications: Statin use:  No, will prescribe at f/u ASA use:  No, will prescribe at f/u Plavix use:  no Beta blocker use: no, on ACEI Coumadin use: no

## 2015-12-06 NOTE — Progress Notes (Signed)
Filed Vitals:   12/06/15 1038 12/06/15 1044  BP: 145/79 142/74  Pulse: 82 73  Temp: 99.6 F (37.6 C)   TempSrc: Oral   Resp: 16   Height: 5\' 11"  (1.803 m)   Weight: 185 lb (83.915 kg)   SpO2: 98%

## 2015-12-06 NOTE — Progress Notes (Signed)
Patient is a 65 year old male who recently underwent femoral to below-knee popliteal bypass with propaten and toe amputation by Dr. Bridgett Larsson. He was having some bleeding from his toe amputation site earlier today and wanted to have this evaluated. The bleeding was apparently fairly slow but persistent. It has now stopped. He did have slightly elevated temperature yesterday of 100.4. He states he feels better now.  Physical exam:  Filed Vitals:   12/06/15 1038 12/06/15 1044  BP: 145/79 142/74  Pulse: 82 73  Temp: 99.6 F (37.6 C)   TempSrc: Oral   Resp: 16   Height: 5\' 11"  (1.803 m)   Weight: 185 lb (83.915 kg)   SpO2: 98%     Right lower extremity: Healing groin and below-knee incision without erythema or drainage, right toe amputation site is intact. There is some peri-incisional erythema but the patient states this is similar to the time of discharge. There is no purulent drainage. The suture line is intact. There is no active bleeding. There is no hematoma.  Assessment: Healing to amputation site patient told signs symptoms of increasing hematoma or bleeding to monitor.  He was given a renewal of his Percocet prescription #30 dispensed today. He has follow-up scheduled with Dr. Vallarie Mare.  Plan: See above  Ruta Hinds, MD [facility] [place] Office: [phone] Pager: [phone]

## 2015-12-06 NOTE — Telephone Encounter (Signed)
rec'd phone call from pt's wife.  Reported pt. Showered this AM, and following his shower, noted bleeding from right great toe amp. Site.  Reported the bleeding has since stopped, after elevating and resting.  Reported there is a slight opening of the incision between stitches.  Also reported pt. had fever last evening and was given Advil; reported fever broke around 12 midnight.  Voiced concern about redness of right lower leg incision.  Advised to apply a moderate pressure bandage to the right great toe and keep right foot elevated.  Appt. Given for 10:30 AM; encouraged to keep the right leg elevated during transport to the hospital.  Wife verb. Understanding.

## 2015-12-07 ENCOUNTER — Encounter: Payer: Self-pay | Admitting: Family Medicine

## 2015-12-07 ENCOUNTER — Ambulatory Visit (INDEPENDENT_AMBULATORY_CARE_PROVIDER_SITE_OTHER): Payer: PRIVATE HEALTH INSURANCE | Admitting: Family Medicine

## 2015-12-07 VITALS — BP 138/80 | HR 72

## 2015-12-07 DIAGNOSIS — E119 Type 2 diabetes mellitus without complications: Secondary | ICD-10-CM

## 2015-12-07 DIAGNOSIS — Z89411 Acquired absence of right great toe: Secondary | ICD-10-CM | POA: Diagnosis not present

## 2015-12-07 DIAGNOSIS — I739 Peripheral vascular disease, unspecified: Secondary | ICD-10-CM | POA: Diagnosis not present

## 2015-12-07 DIAGNOSIS — E1159 Type 2 diabetes mellitus with other circulatory complications: Secondary | ICD-10-CM | POA: Diagnosis not present

## 2015-12-07 DIAGNOSIS — I10 Essential (primary) hypertension: Secondary | ICD-10-CM | POA: Diagnosis not present

## 2015-12-07 DIAGNOSIS — E785 Hyperlipidemia, unspecified: Secondary | ICD-10-CM

## 2015-12-07 DIAGNOSIS — E1169 Type 2 diabetes mellitus with other specified complication: Secondary | ICD-10-CM | POA: Diagnosis not present

## 2015-12-07 DIAGNOSIS — IMO0002 Reserved for concepts with insufficient information to code with codable children: Secondary | ICD-10-CM | POA: Insufficient documentation

## 2015-12-07 LAB — GLUCOSE, POCT (MANUAL RESULT ENTRY): POC Glucose: 129 mg/dl — AB (ref 70–99)

## 2015-12-07 MED ORDER — OXYCODONE-ACETAMINOPHEN 10-325 MG PO TABS: 1.0000 | ORAL_TABLET | ORAL | Status: DC | PRN

## 2015-12-07 MED ORDER — ATORVASTATIN CALCIUM 10 MG PO TABS: 10.0000 mg | ORAL_TABLET | Freq: Every day | ORAL | Status: DC

## 2015-12-07 NOTE — Patient Instructions (Signed)
Though to the American diabetes Association left site or Familydoctor.org

## 2015-12-07 NOTE — Progress Notes (Signed)
   Subjective:    Patient ID: Adam [name], male    DOB: 1951-03-09, 65 y.o.   MRN: [id number]  HPI Here for a follow-up visit. Since his last visit with me, he has been admitted to the hospital and had vascular surgery done on the right leg. He also had his right great toe amputated. He has followed up with cardiovascular and was seen yesterday concerning this. He still having significant amount of pain from this. At the same time he was diagnosed with new onset diabetes with hemoglobin A1c of 7.1. He is here for follow-up concerning this.    Review of Systems     Objective:   Physical Exam Are tan in no distress. Blood sugar today is 129. He is in a walking boot and using a walker.       Assessment & Plan:  New onset type 2 diabetes mellitus ([place]) - Plan: Amb Referral to Nutrition and Diabetic E, POCT glucose (manual entry)  Great toe amputation status, right ([place]) - Plan: oxyCODONE-acetaminophen (PERCOCET) 10-325 MG tablet  Hypertension associated with diabetes ([place])  Hyperlipidemia associated with type 2 diabetes mellitus ([place]) - Plan: atorvastatin (LIPITOR) 10 MG tablet We discussed the vascular surgery and he seems to be making good progress. This was approximately one week ago. He is still in a significant amount of pain and I did give her Percocet as he was about to run out of the prescription given by the surgeons. He will follow-up with them as per previously planned. I then discussed the diagnosis of diabetes with him in regard to diet, exercise, eye care, foot care, checking blood sugars regularly. Also discussed the risk of stroke, blindness, heart disease, renal and vascular as well as neurologic issues with him. Diet and exercise were also mentioned in regard to be more frequent but smaller meals. He apparently does most of the cooking. I will also start him on Lipitor although his LDL was in a good range. Explained the benefit of doing that especially with his underlying  vascular disease at obvious he started before the diagnosis of diabetes was made. He was given a prescription for Lipitor as well as glucometer strips and lancets. Given information concerning websites to go to. He will return here in one month or call if he has any questions. He expressed understanding of this. Over 45 minutes, greater than 50% spent in counseling and coordination of care.

## 2015-12-11 ENCOUNTER — Encounter: Payer: Self-pay | Admitting: Vascular Surgery

## 2015-12-11 ENCOUNTER — Telehealth: Payer: Self-pay

## 2015-12-11 NOTE — Telephone Encounter (Addendum)
Phone call from pt's. wife.  Reported pt. has a blister above the right great toe amp. incision.  Stated there is also a new, small black area on lateral side of the right foot.   Stated the stitches are intact.  Described white areas along incision, and oozing of light, thin, bloody drainage.  Stated "he is in a lot of pain and I'm worried about him.Marland KitchenMarland KitchenI think he needs to be seen."   Denied any fever/ chills.  Stated the swelling of the right lower extremity has improved.  Appt. given for 12/12/15 @ 10:45 AM. with Dr. Bridgett Larsson.   Agrees with plan.

## 2015-12-12 ENCOUNTER — Other Ambulatory Visit: Payer: Self-pay

## 2015-12-12 ENCOUNTER — Encounter: Payer: Self-pay | Admitting: Vascular Surgery

## 2015-12-12 ENCOUNTER — Ambulatory Visit (INDEPENDENT_AMBULATORY_CARE_PROVIDER_SITE_OTHER): Payer: No Typology Code available for payment source | Admitting: Vascular Surgery

## 2015-12-12 VITALS — BP 136/71 | HR 71 | Temp 98.5°F | Resp 16 | Ht 71.0 in | Wt 182.0 lb

## 2015-12-12 DIAGNOSIS — I70261 Atherosclerosis of native arteries of extremities with gangrene, right leg: Secondary | ICD-10-CM

## 2015-12-12 DIAGNOSIS — T8140XA Infection following a procedure, unspecified, initial encounter: Secondary | ICD-10-CM

## 2015-12-12 MED ORDER — AMOXICILLIN-POT CLAVULANATE 875-125 MG PO TABS: 1.0000 | ORAL_TABLET | Freq: Two times a day (BID) | ORAL | Status: DC

## 2015-12-12 MED ORDER — OXYCODONE HCL 10 MG PO TABS: 10.0000 mg | ORAL_TABLET | ORAL | Status: DC | PRN

## 2015-12-12 NOTE — Progress Notes (Signed)
    Postoperative Visit   History of Present Illness  Adam [name] is a 65 y.o. year old male who presents for postoperative follow-up for:  1.  R CFA to BK pop BPG with Propaten, Miller cuff (11/28/15) 2.  R great toe amputation (12/01/15)  The patient's great toe wound recently has started draining and pain in right foot has worsened.  The patient had a period of improvement which now has reversed.  For VQI Use Only  PRE-ADM LIVING: Home  AMB STATUS: Ambulatory with Assistance  Physical Examination  Filed Vitals:   12/12/15 1051  BP: 136/71  Pulse: 71  Temp: 98.5 F (36.9 C)  Resp: 16   RLE: groin incision is healing, popliteal incision is healing, R great toe incision is draining pus, no ascending cellulitis, ischemic appearing skin overlying the R 1st great toe amp site, dopplerable DP and PT signal worsen than post-op  Medical Decision Making  Adam [name] is a 65 y.o. year old male who presents s/p R CFA to BK pop BPG w/ propaten, Miller cuff adjunct, s/p R great toe amputation.   Unfortunately, the amputation site appears to be falling apart with ischemia.  I removed a few stitches to let a shallow abscess drain.  The underlying subcutaneous tissue appears to not be healing but no deep purulence was noted.  Would start him on Augmentin DS 1 po bid x 10 days as a precaution given prosthetic graft presence.  This patient angiogram demonstrates significant tibial disease, with a fem-pop bypass functionally behaving as a popliteal island bypass based on the angiogram.  The immediately post-op ABI were hopeful but unfortunately, it appears his tibial and digital artery disease is limiting the benefit of his bypass.    Unfortunately, he is not a candidate for a fem-tib bypass as both GSV are disease with evidence of prior phlebitis.    Will refer patient back to Dr. Sharol Given for any potential foot debridement vs TMA.  Unfortunately, I suspect this patient is going  to need a R BKA.    The patient will follow up with me at his regularly scheduled post-op appointment in early Feb.  Adele Barthel, MD [facility] H: [phone] Pager: [phone]  12/12/2015, 5:41 PM

## 2015-12-15 ENCOUNTER — Encounter (HOSPITAL_COMMUNITY): Payer: Self-pay | Admitting: Vascular Surgery

## 2015-12-15 ENCOUNTER — Inpatient Hospital Stay (HOSPITAL_COMMUNITY)
Admission: EM | Admit: 2015-12-15 | Discharge: 2015-12-20 | DRG: 240 | Disposition: A | Discharge status: Rehabilitation Facility | Payer: Medicare Other | Attending: Vascular Surgery | Admitting: Vascular Surgery

## 2015-12-15 DIAGNOSIS — D72829 Elevated white blood cell count, unspecified: Secondary | ICD-10-CM | POA: Diagnosis not present

## 2015-12-15 DIAGNOSIS — T8789 Other complications of amputation stump: Secondary | ICD-10-CM | POA: Diagnosis not present

## 2015-12-15 DIAGNOSIS — E785 Hyperlipidemia, unspecified: Secondary | ICD-10-CM | POA: Diagnosis present

## 2015-12-15 DIAGNOSIS — G4730 Sleep apnea, unspecified: Secondary | ICD-10-CM | POA: Diagnosis present

## 2015-12-15 DIAGNOSIS — R269 Unspecified abnormalities of gait and mobility: Secondary | ICD-10-CM | POA: Insufficient documentation

## 2015-12-15 DIAGNOSIS — D62 Acute posthemorrhagic anemia: Secondary | ICD-10-CM | POA: Diagnosis present

## 2015-12-15 DIAGNOSIS — I96 Gangrene, not elsewhere classified: Secondary | ICD-10-CM | POA: Diagnosis not present

## 2015-12-15 DIAGNOSIS — I10 Essential (primary) hypertension: Secondary | ICD-10-CM | POA: Diagnosis not present

## 2015-12-15 DIAGNOSIS — K5900 Constipation, unspecified: Secondary | ICD-10-CM | POA: Diagnosis present

## 2015-12-15 DIAGNOSIS — R740 Nonspecific elevation of levels of transaminase and lactic acid dehydrogenase [LDH]: Secondary | ICD-10-CM | POA: Diagnosis not present

## 2015-12-15 DIAGNOSIS — K5903 Drug induced constipation: Secondary | ICD-10-CM | POA: Diagnosis not present

## 2015-12-15 DIAGNOSIS — G894 Chronic pain syndrome: Secondary | ICD-10-CM | POA: Diagnosis not present

## 2015-12-15 DIAGNOSIS — M792 Neuralgia and neuritis, unspecified: Secondary | ICD-10-CM | POA: Diagnosis not present

## 2015-12-15 DIAGNOSIS — Z79899 Other long term (current) drug therapy: Secondary | ICD-10-CM | POA: Diagnosis not present

## 2015-12-15 DIAGNOSIS — E1142 Type 2 diabetes mellitus with diabetic polyneuropathy: Secondary | ICD-10-CM | POA: Diagnosis present

## 2015-12-15 DIAGNOSIS — Z89411 Acquired absence of right great toe: Secondary | ICD-10-CM

## 2015-12-15 DIAGNOSIS — E871 Hypo-osmolality and hyponatremia: Secondary | ICD-10-CM | POA: Diagnosis present

## 2015-12-15 DIAGNOSIS — E1152 Type 2 diabetes mellitus with diabetic peripheral angiopathy with gangrene: Principal | ICD-10-CM | POA: Diagnosis present

## 2015-12-15 DIAGNOSIS — R21 Rash and other nonspecific skin eruption: Secondary | ICD-10-CM | POA: Diagnosis not present

## 2015-12-15 DIAGNOSIS — G8918 Other acute postprocedural pain: Secondary | ICD-10-CM | POA: Diagnosis not present

## 2015-12-15 DIAGNOSIS — I70261 Atherosclerosis of native arteries of extremities with gangrene, right leg: Secondary | ICD-10-CM | POA: Diagnosis not present

## 2015-12-15 DIAGNOSIS — Z7982 Long term (current) use of aspirin: Secondary | ICD-10-CM | POA: Diagnosis not present

## 2015-12-15 DIAGNOSIS — R6510 Systemic inflammatory response syndrome (SIRS) of non-infectious origin without acute organ dysfunction: Secondary | ICD-10-CM | POA: Insufficient documentation

## 2015-12-15 DIAGNOSIS — Z792 Long term (current) use of antibiotics: Secondary | ICD-10-CM

## 2015-12-15 DIAGNOSIS — Z89611 Acquired absence of right leg above knee: Secondary | ICD-10-CM | POA: Diagnosis not present

## 2015-12-15 DIAGNOSIS — Z89511 Acquired absence of right leg below knee: Secondary | ICD-10-CM | POA: Insufficient documentation

## 2015-12-15 DIAGNOSIS — Y836 Removal of other organ (partial) (total) as the cause of abnormal reaction of the patient, or of later complication, without mention of misadventure at the time of the procedure: Secondary | ICD-10-CM | POA: Diagnosis present

## 2015-12-15 DIAGNOSIS — G8929 Other chronic pain: Secondary | ICD-10-CM | POA: Diagnosis present

## 2015-12-15 DIAGNOSIS — Z95828 Presence of other vascular implants and grafts: Secondary | ICD-10-CM | POA: Diagnosis not present

## 2015-12-15 DIAGNOSIS — R58 Hemorrhage, not elsewhere classified: Secondary | ICD-10-CM | POA: Diagnosis not present

## 2015-12-15 DIAGNOSIS — I998 Other disorder of circulatory system: Secondary | ICD-10-CM | POA: Diagnosis not present

## 2015-12-15 DIAGNOSIS — I739 Peripheral vascular disease, unspecified: Secondary | ICD-10-CM | POA: Diagnosis present

## 2015-12-15 DIAGNOSIS — Z87891 Personal history of nicotine dependence: Secondary | ICD-10-CM | POA: Diagnosis not present

## 2015-12-15 DIAGNOSIS — Z4781 Encounter for orthopedic aftercare following surgical amputation: Secondary | ICD-10-CM | POA: Diagnosis present

## 2015-12-15 DIAGNOSIS — G4733 Obstructive sleep apnea (adult) (pediatric): Secondary | ICD-10-CM | POA: Diagnosis present

## 2015-12-15 DIAGNOSIS — Z79891 Long term (current) use of opiate analgesic: Secondary | ICD-10-CM

## 2015-12-15 DIAGNOSIS — T8189XA Other complications of procedures, not elsewhere classified, initial encounter: Secondary | ICD-10-CM | POA: Diagnosis not present

## 2015-12-15 DIAGNOSIS — L8990 Pressure ulcer of unspecified site, unspecified stage: Secondary | ICD-10-CM | POA: Insufficient documentation

## 2015-12-15 LAB — URINALYSIS, ROUTINE W REFLEX MICROSCOPIC
BILIRUBIN URINE: NEGATIVE
Glucose, UA: NEGATIVE mg/dL
HGB URINE DIPSTICK: NEGATIVE
KETONES UR: NEGATIVE mg/dL
Leukocytes, UA: NEGATIVE
NITRITE: NEGATIVE
PH: 6.5 (ref 5.0–8.0)
Protein, ur: NEGATIVE mg/dL
SPECIFIC GRAVITY, URINE: 1.016 (ref 1.005–1.030)

## 2015-12-15 LAB — CBC
HEMATOCRIT: 39.9 % (ref 39.0–52.0)
Hemoglobin: 13.2 g/dL (ref 13.0–17.0)
MCH: 31 pg (ref 26.0–34.0)
MCHC: 33.1 g/dL (ref 30.0–36.0)
MCV: 93.7 fL (ref 78.0–100.0)
PLATELETS: 368 10*3/uL (ref 150–400)
RBC: 4.26 MIL/uL (ref 4.22–5.81)
RDW: 12.6 % (ref 11.5–15.5)
WBC: 10.1 10*3/uL (ref 4.0–10.5)

## 2015-12-15 LAB — CREATININE, SERUM: Creatinine, Ser: 0.81 mg/dL (ref 0.61–1.24)

## 2015-12-15 LAB — GLUCOSE, CAPILLARY: GLUCOSE-CAPILLARY: 112 mg/dL — AB (ref 65–99)

## 2015-12-15 LAB — CBG MONITORING, ED: Glucose-Capillary: 127 mg/dL — ABNORMAL HIGH (ref 65–99)

## 2015-12-15 MED ORDER — NALOXONE HCL 0.4 MG/ML IJ SOLN: 0.4000 mg | INTRAMUSCULAR | Status: DC | PRN

## 2015-12-15 MED ORDER — SODIUM CHLORIDE 0.9% FLUSH: 9.0000 mL | INTRAVENOUS | Status: DC | PRN

## 2015-12-15 MED ORDER — HYDRALAZINE HCL 20 MG/ML IJ SOLN: 5.0000 mg | INTRAMUSCULAR | Status: DC | PRN

## 2015-12-15 MED ORDER — DIPHENHYDRAMINE HCL 50 MG/ML IJ SOLN: 12.5000 mg | Freq: Four times a day (QID) | INTRAMUSCULAR | Status: DC | PRN

## 2015-12-15 MED ORDER — ENOXAPARIN SODIUM 30 MG/0.3ML ~~LOC~~ SOLN
30.0000 mg | SUBCUTANEOUS | Status: AC
Start: 2015-12-15 — End: 2015-12-15
  Administered 2015-12-15: 30 mg via SUBCUTANEOUS
  Filled 2015-12-15: qty 0.3

## 2015-12-15 MED ORDER — ONDANSETRON HCL 4 MG/2ML IJ SOLN: 4.0000 mg | Freq: Four times a day (QID) | INTRAMUSCULAR | Status: DC | PRN

## 2015-12-15 MED ORDER — HYDROMORPHONE 1 MG/ML IV SOLN
INTRAVENOUS | Status: DC
  Administered 2015-12-15: 1 mg via INTRAVENOUS

## 2015-12-15 MED ORDER — MORPHINE SULFATE (PF) 2 MG/ML IV SOLN
2.0000 mg | INTRAVENOUS | Status: DC | PRN
  Administered 2015-12-15 (×4): 2 mg via INTRAVENOUS
  Filled 2015-12-15 (×4): qty 1

## 2015-12-15 MED ORDER — SODIUM CHLORIDE 0.9 % IV SOLN: 250.0000 mL | INTRAVENOUS | Status: DC | PRN

## 2015-12-15 MED ORDER — GUAIFENESIN-DM 100-10 MG/5ML PO SYRP: 15.0000 mL | ORAL_SOLUTION | ORAL | Status: DC | PRN

## 2015-12-15 MED ORDER — LABETALOL HCL 5 MG/ML IV SOLN: 10.0000 mg | INTRAVENOUS | Status: DC | PRN

## 2015-12-15 MED ORDER — CEFAZOLIN SODIUM 1-5 GM-% IV SOLN
1.0000 g | Freq: Three times a day (TID) | INTRAVENOUS | Status: DC
  Administered 2015-12-15 – 2015-12-20 (×15): 1 g via INTRAVENOUS
  Filled 2015-12-15 (×17): qty 50

## 2015-12-15 MED ORDER — PHENOL 1.4 % MT LIQD: 1.0000 | OROMUCOSAL | Status: DC | PRN

## 2015-12-15 MED ORDER — PANTOPRAZOLE SODIUM 40 MG PO TBEC
40.0000 mg | DELAYED_RELEASE_TABLET | Freq: Every day | ORAL | Status: DC
  Administered 2015-12-15: 40 mg via ORAL
  Filled 2015-12-15: qty 1

## 2015-12-15 MED ORDER — HYDROMORPHONE 1 MG/ML IV SOLN
INTRAVENOUS | Status: DC
  Filled 2015-12-15: qty 25

## 2015-12-15 MED ORDER — DIPHENHYDRAMINE HCL 12.5 MG/5ML PO ELIX: 12.5000 mg | ORAL_SOLUTION | Freq: Four times a day (QID) | ORAL | Status: DC | PRN

## 2015-12-15 MED ORDER — ALUM & MAG HYDROXIDE-SIMETH 200-200-20 MG/5ML PO SUSP: 15.0000 mL | ORAL | Status: DC | PRN

## 2015-12-15 MED ORDER — DEXTROSE 5 % IV SOLN: 1.5000 g | INTRAVENOUS | Status: DC

## 2015-12-15 MED ORDER — INSULIN ASPART 100 UNIT/ML ~~LOC~~ SOLN
0.0000 [IU] | Freq: Three times a day (TID) | SUBCUTANEOUS | Status: DC
  Administered 2015-12-15: 2 [IU] via SUBCUTANEOUS

## 2015-12-15 MED ORDER — SODIUM CHLORIDE 0.9% FLUSH: 3.0000 mL | INTRAVENOUS | Status: DC | PRN

## 2015-12-15 MED ORDER — METOPROLOL TARTRATE 1 MG/ML IV SOLN: 2.0000 mg | INTRAVENOUS | Status: DC | PRN

## 2015-12-15 MED ORDER — SODIUM CHLORIDE 0.9% FLUSH
3.0000 mL | Freq: Two times a day (BID) | INTRAVENOUS | Status: DC
  Administered 2015-12-15: 3 mL via INTRAVENOUS

## 2015-12-15 MED ORDER — HYDROMORPHONE 1 MG/ML IV SOLN
INTRAVENOUS | Status: DC
  Administered 2015-12-16: 0.5 mg via INTRAVENOUS
  Administered 2015-12-16: 5.45 mg via INTRAVENOUS
  Administered 2015-12-16: 11:00:00 via INTRAVENOUS
  Administered 2015-12-16: 6.95 mg via INTRAVENOUS
  Administered 2015-12-16: 0.5 mg via INTRAVENOUS
  Administered 2015-12-16: 8 mg via INTRAVENOUS
  Administered 2015-12-16: 0.5 mg via INTRAVENOUS
  Administered 2015-12-17: 2.92 mg via INTRAVENOUS
  Administered 2015-12-17: 10:00:00 via INTRAVENOUS
  Administered 2015-12-17: 2.2 mg via INTRAVENOUS
  Administered 2015-12-17: 3.64 mg via INTRAVENOUS
  Administered 2015-12-17: 3.23 mg via INTRAVENOUS
  Administered 2015-12-17: 3.6 mg via INTRAVENOUS
  Administered 2015-12-18: 1.3 mg via INTRAVENOUS
  Administered 2015-12-18: 1.71 mg via INTRAVENOUS
  Administered 2015-12-18: 2.96 mg via INTRAVENOUS
  Filled 2015-12-15: qty 25

## 2015-12-15 MED ORDER — MORPHINE SULFATE 2 MG/ML IV SOLN
INTRAVENOUS | Status: DC
  Administered 2015-12-15: 6 mg via INTRAVENOUS
  Filled 2015-12-15: qty 25

## 2015-12-15 NOTE — ED Notes (Addendum)
Pt reports to the ED for eval of right foot pain. Pt had an arterial bypass in his right leg for reduced circulation performed by Dr. Donnetta Hutching on 1/11 and had his right great toe removed on 1/19. Pt reports he has been having increased pain, decreased sensation, and decreased temperature to the foot for the past week. Foot is cyanotic, cool to touch, and no pulse palpable or with doppler. Pt is on oxycodone and high dose Augmentin without relief. Surgical site noted to have yellow drainage and partially open. Pt A&Ox4 and skin warm and dry. Denies any CP or SOB. Dr. Donnetta Hutching sent him here.

## 2015-12-15 NOTE — Progress Notes (Addendum)
MEDICATION RELATED CONSULT NOTE - INITIAL   Pharmacy Consult for dilaudid Indication: pain management  No Known Allergies  Patient Measurements: Height: 5\' 11"  (180.3 cm) Weight: 179 lb 7.3 oz (81.4 kg) IBW/kg (Calculated) : 75.3 Adjusted Body Weight:   Vital Signs: Temp: 98.4 F (36.9 C) (01/28 1502) Temp Source: Oral (01/28 1502) BP: 159/78 mmHg (01/28 1502) Pulse Rate: 75 (01/28 1502) Intake/Output from previous day:   Intake/Output from this shift:    Labs:  Recent Labs  12/15/15 1000  WBC 10.1  HGB 13.2  HCT 39.9  PLT 368  CREATININE 0.81   Estimated Creatinine Clearance: 96.8 mL/min (by C-G formula based on Cr of 0.81).   Microbiology: Recent Results (from the past 720 hour(s))  Surgical pcr screen     Status: Abnormal   Collection Time: 11/28/15  7:45 AM  Result Value Ref Range Status   MRSA, PCR NEGATIVE NEGATIVE Final   Staphylococcus aureus POSITIVE (A) NEGATIVE Final    Comment:        The Xpert SA Assay (FDA approved for NASAL specimens in patients over 75 years of age), is one component of a comprehensive surveillance program.  Test performance has been validated by [facility] for patients greater than or equal to 60 year old. It is not intended to diagnose infection nor to guide or monitor treatment.     Medical History: Past Medical History  Diagnosis Date  . Diabetes mellitus without complication ([place])   . Hypertension   . Peripheral vascular disease ([place])   . Gangrene ([place]) 11/22/2015    right great toe  . Sleep apnea     uses cpap    Medications:  Scheduled:  .  ceFAZolin (ANCEF) IV  1 g Intravenous 3 times per day  . HYDROmorphone   Intravenous 6 times per day  . insulin aspart  0-15 Units Subcutaneous TID WC  . pantoprazole  40 mg Oral Daily  . sodium chloride flush  3 mL Intravenous Q12H   Infusions:    Assessment: 65 yo who came in today for extreme foot pain with gangrenous changes to the great right toe a few  weeks ago. He got an amputation of the toe. He now has ongoing critical ischemia. He is being admitted for pain control now and amputation tomorrow. We are going to use dilaudid PCA. I'm going to add a basal rate to the PCA to get the acute pain in control.   Goal of Therapy:  Pain control   Plan:   Full dose Dilaudid PCA with basal rate at 0.2mg /hr F/u with pain status and adjust basal rate as needed  Onnie Boer, PharmD Pager: [phone] 12/15/2015 6:41 PM  Addendum  Pt still has a pain score of 10. We are going to try to increase the basal rate to 0.4mg /hr. If he is still not controlled, we are going to try to add a PRN dilaudid on top of the PCA.  Onnie Boer, PharmD Pager: [phone] 12/15/2015 10:28 PM

## 2015-12-15 NOTE — ED Notes (Signed)
Attempted report 

## 2015-12-15 NOTE — H&P (Signed)
Patient name: Adam [name] MRN: [id number] DOB: June 09, 1951 Sex: male     Reason for referral:  Chief Complaint  Patient presents with  . Foot Pain    HISTORY OF PRESENT ILLNESS: Patient is a 65 year old gentleman who presents to the emergency department with severe intolerable right foot pain. He had presented with gangrenous changes in his right great toe approximately 3-4 weeks ago. He underwent arteriogram on 11/26/2015. This showed normal aortoiliac segments. He had complete occlusion of his superficial femoral artery from the origin to the above-knee popliteal artery. There was two-vessel runoff initially but complete occlusion of all tibial vessels above the ankle and a very diseased reconstituted dorsalis pedis in the foot. He underwent right femoral to below-knee popliteal bypass on 11/28/2015. He underwent amputation of his right great toe on 12/01/2015. He is having a poorly healing amputation site and this had progressive pain in his right foot. His wife called this morning stating that this is been unbearable. He has not slept for days in his whole right foot is cool. He was instructed to present to the emergency room. 7 history of non-insulin-dependent diabetes.  Past Medical History  Diagnosis Date  . Diabetes mellitus without complication ([place])   . Hypertension   . Peripheral vascular disease ([place])   . Gangrene ([place]) 11/22/2015    right great toe  . Sleep apnea     uses cpap    Past Surgical History  Procedure Laterality Date  . Peripheral vascular catheterization N/A 11/26/2015    Procedure: Abdominal Aortogram;  Surgeon: [name], MD;  Location: [facility];  Service: Cardiovascular;  Laterality: N/A;  . Hernia repair Bilateral 2000    inguinal  . Vasectomy    . Colonoscopy    . Femoral-popliteal bypass graft Right 11/28/2015    Procedure: RIGHT  FEMORAL-POPLITEAL ARTERY BYPASS GRAFT AND CONSTRUCTION OF MILLER CUFF USING 6 MM X 80 CM  PROPATEN  GRAFT ;  Surgeon: Conrad [place], MD;  Location: [facility];  Service: Vascular;  Laterality: Right;  . Amputation Right 12/01/2015    Procedure: AMPUTATION RIGHT GREAT;  Surgeon: Conrad Hopkinton, MD;  Location: [facility];  Service: Vascular;  Laterality: Right;    Social History   Social History  . Marital Status: Married    Spouse Name: N/A  . Number of Children: N/A  . Years of Education: N/A   Occupational History  . Not on file.   Social History Main Topics  . Smoking status: Former Smoker    Quit date: 10/06/2011  . Smokeless tobacco: Never Used  . Alcohol Use: 4.2 oz/week    7 Glasses of wine, 0 Standard drinks or equivalent per week  . Drug Use: No  . Sexual Activity: Yes   Other Topics Concern  . Not on file   Social History Narrative    Family History  Problem Relation Age of Onset  . Hypertension Mother     Allergies as of 12/15/2015  . (No Known Allergies)    No current facility-administered medications on file prior to encounter.   Current Outpatient Prescriptions on File Prior to Encounter  Medication Sig Dispense Refill  . amoxicillin-clavulanate (AUGMENTIN) 875-125 MG tablet Take 1 tablet by mouth 2 (two) times daily. 20 tablet 0  . aspirin 81 MG tablet Take 81 mg by mouth daily.    Marland Kitchen atorvastatin (LIPITOR) 10 MG tablet Take 1 tablet (10 mg total) by mouth daily. 90 tablet  3  . Blood Glucose Monitoring Suppl (ONE TOUCH ULTRA MINI) w/Device KIT     . lisinopril-hydrochlorothiazide (PRINZIDE,ZESTORETIC) 20-12.5 MG per tablet Take 1 tablet by mouth daily. 90 tablet 3  . ONE TOUCH ULTRA TEST test strip     . ONETOUCH DELICA LANCETS 50N MISC     . oxyCODONE 10 MG TABS Take 1 tablet (10 mg total) by mouth every 4 (four) hours as needed for moderate pain. 50 tablet 0  . oxyCODONE-acetaminophen (PERCOCET) 10-325 MG tablet Take 1 tablet by mouth every 4 (four) hours as needed for pain. 30 tablet 0  . oxyCODONE-acetaminophen (PERCOCET/ROXICET) 5-325 MG tablet  Take 1-2 tablets by mouth every 6 (six) hours as needed for moderate pain. 30 tablet 0  . traMADol (ULTRAM) 50 MG tablet Take 1 tablet (50 mg total) by mouth every 8 (eight) hours as needed. (Patient not taking: Reported on 12/06/2015) 30 tablet 0     REVIEW OF SYSTEMS:  Obstructive sleep apnea. No other changes.  PHYSICAL EXAMINATION:  General: The patient is a well-nourished male, and no significant distress related to right foot pain. Vital signs are BP 152/75 mmHg  Pulse 90  Temp(Src) 99.1 F (37.3 C) (Oral)  Resp 17  Ht 5' 11" (1.803 m)  Wt 184 lb (83.462 kg)  BMI 25.67 kg/m2  SpO2 97% Pulmonary: There is a good air exchange  Abdomen: Soft and non-tender Musculoskeletal: There are no major deformities.   Neurologic: No focal weakness or paresthesias are detected, Skin: Entire right foot is cool and mottled. He has bullous changes at the amputation site of his great toe. Psychiatric: The patient has normal affect. Cardiovascular: Palpable femoral pulse on the right. Absent popliteal pulse  I did image his popliteal space with SonoSite ultrasound. The graft was visualized but does not appear to have flow.    Impression and Plan:  Ongoing critical ischemia with nonhealing right great toe amputation with intolerable pain. Had a very long discussion with the patient and his wife present. Explained that his arteriogram revealed essentially no runoff into the foot. Also explained that he has had apparent occlusion of his femoropopliteal bypass. I discussed options with the patient and his wife. I did explain the possibility of thrombectomy and possible revision of his femoropopliteal bypass. Explained that with the bypass failing within 2 weeks, most likely reason for failure is no outflow. Also with the extent of necrosis in his foot, and not sure that he would be able to heal his foot even with a patent graft at this point. Explained to patient certainly has risk for nonhealing of  the below-knee amputation as well which would require above-knee amputation. His calf is soft. He does have some mild tenderness in his pretibial area. The patient does not want to consider any further attempts at salvage. I would agree with this decision. Explained that there would be very unlikely to be able to obtain salvage at this point. Will admit today for pain control and plan right below-knee amputation tomorrow. He has eaten today so will keep nothing by mouth tonight with surgery in the morning    Emmalene Kattner [facility] Office: [phone]

## 2015-12-15 NOTE — ED Notes (Signed)
Dr.Early paged to [phone]

## 2015-12-15 NOTE — Progress Notes (Signed)
Pt described pain as 10/10 even with morphine PCA. Called MD and obtained order for Dilauded. Pharmacy dosed. Regular 2W RN assisted with obtaining order and setting up PCA pump.

## 2015-12-15 NOTE — ED Provider Notes (Addendum)
Medical screening exam Patient here to see Dr. Donnetta Hutching. HAs vascular insufficiency of right lower extremity. He is hemodynamically stable and appears comfortable at present  Orlie Dakin, MD 12/15/15 [place], MD 12/21/15 1422

## 2015-12-16 ENCOUNTER — Encounter (HOSPITAL_COMMUNITY)
Admission: EM | Disposition: A | Discharge status: Rehabilitation Facility | Payer: Self-pay | Source: Home / Self Care | Attending: Vascular Surgery

## 2015-12-16 ENCOUNTER — Inpatient Hospital Stay (HOSPITAL_COMMUNITY): Payer: Medicare Other | Admitting: Certified Registered"

## 2015-12-16 HISTORY — PX: AMPUTATION: SHX166

## 2015-12-16 LAB — BASIC METABOLIC PANEL
ANION GAP: 6 (ref 5–15)
BUN: 18 mg/dL (ref 6–20)
CHLORIDE: 104 mmol/L (ref 101–111)
CO2: 28 mmol/L (ref 22–32)
Calcium: 9.1 mg/dL (ref 8.9–10.3)
Creatinine, Ser: 0.72 mg/dL (ref 0.61–1.24)
GFR calc non Af Amer: >60 mL/min (ref >60)
Glucose, Bld: 107 mg/dL — ABNORMAL HIGH (ref 65–99)
POTASSIUM: 3.8 mmol/L (ref 3.5–5.1)
SODIUM: 138 mmol/L (ref 135–145)

## 2015-12-16 LAB — CBC
HEMATOCRIT: 37.4 % — AB (ref 39.0–52.0)
HEMOGLOBIN: 12 g/dL — AB (ref 13.0–17.0)
MCH: 30.1 pg (ref 26.0–34.0)
MCHC: 32.1 g/dL (ref 30.0–36.0)
MCV: 93.7 fL (ref 78.0–100.0)
Platelets: 356 10*3/uL (ref 150–400)
RBC: 3.99 MIL/uL — AB (ref 4.22–5.81)
RDW: 12.6 % (ref 11.5–15.5)
WBC: 9.1 10*3/uL (ref 4.0–10.5)

## 2015-12-16 LAB — GLUCOSE, CAPILLARY
GLUCOSE-CAPILLARY: 120 mg/dL — AB (ref 65–99)
Glucose-Capillary: 106 mg/dL — ABNORMAL HIGH (ref 65–99)
Glucose-Capillary: 96 mg/dL (ref 65–99)
Glucose-Capillary: 134 mg/dL — ABNORMAL HIGH (ref 65–99)
Glucose-Capillary: 118 mg/dL — ABNORMAL HIGH (ref 65–99)

## 2015-12-16 LAB — PROTIME-INR
INR: 1.07 (ref 0.00–1.49)
Prothrombin Time: 14.1 seconds (ref 11.6–15.2)

## 2015-12-16 SURGERY — AMPUTATION BELOW KNEE
Anesthesia: General | Site: Leg Lower | Laterality: Right

## 2015-12-16 MED ORDER — DOCUSATE SODIUM 100 MG PO CAPS: 100.0000 mg | ORAL_CAPSULE | Freq: Two times a day (BID) | ORAL | Status: DC | PRN

## 2015-12-16 MED ORDER — ENOXAPARIN SODIUM 30 MG/0.3ML ~~LOC~~ SOLN
30.0000 mg | SUBCUTANEOUS | Status: DC
  Administered 2015-12-17 – 2015-12-20 (×4): 30 mg via SUBCUTANEOUS
  Filled 2015-12-16 (×5): qty 0.3

## 2015-12-16 MED ORDER — HYDROMORPHONE HCL 1 MG/ML IJ SOLN
0.5000 mg | INTRAMUSCULAR | Status: DC | PRN
  Administered 2015-12-16 (×3): 0.5 mg via INTRAVENOUS

## 2015-12-16 MED ORDER — ATORVASTATIN CALCIUM 10 MG PO TABS
10.0000 mg | ORAL_TABLET | ORAL | Status: DC
  Administered 2015-12-16 – 2015-12-20 (×3): 10 mg via ORAL
  Filled 2015-12-16 (×3): qty 1

## 2015-12-16 MED ORDER — DOCUSATE SODIUM 100 MG PO CAPS
100.0000 mg | ORAL_CAPSULE | Freq: Every day | ORAL | Status: DC
  Administered 2015-12-17 – 2015-12-19 (×3): 100 mg via ORAL
  Filled 2015-12-16 (×3): qty 1

## 2015-12-16 MED ORDER — DEXTROSE-NACL 5-0.45 % IV SOLN
INTRAVENOUS | Status: DC
  Administered 2015-12-16: 75 mL/h via INTRAVENOUS
  Administered 2015-12-16 – 2015-12-19 (×3): via INTRAVENOUS

## 2015-12-16 MED ORDER — DIAZEPAM 5 MG/ML IJ SOLN
5.0000 mg | Freq: Once | INTRAMUSCULAR | Status: AC
  Administered 2015-12-16: 5 mg via INTRAVENOUS

## 2015-12-16 MED ORDER — HYDRALAZINE HCL 20 MG/ML IJ SOLN: 5.0000 mg | INTRAMUSCULAR | Status: DC | PRN

## 2015-12-16 MED ORDER — LIDOCAINE HCL (CARDIAC) 20 MG/ML IV SOLN
INTRAVENOUS | Status: DC | PRN
  Administered 2015-12-16: 60 mg via INTRAVENOUS

## 2015-12-16 MED ORDER — ACETAMINOPHEN 325 MG PO TABS
325.0000 mg | ORAL_TABLET | ORAL | Status: DC | PRN
  Administered 2015-12-16 – 2015-12-19 (×2): 650 mg via ORAL
  Filled 2015-12-16 (×3): qty 2

## 2015-12-16 MED ORDER — MIDAZOLAM HCL 2 MG/2ML IJ SOLN
INTRAMUSCULAR | Status: AC
  Filled 2015-12-16: qty 2

## 2015-12-16 MED ORDER — DIAZEPAM 5 MG/ML IJ SOLN
5.0000 mg | Freq: Four times a day (QID) | INTRAMUSCULAR | Status: DC | PRN
  Administered 2015-12-16 – 2015-12-18 (×8): 5 mg via INTRAVENOUS
  Filled 2015-12-16 (×9): qty 2

## 2015-12-16 MED ORDER — INSULIN ASPART 100 UNIT/ML ~~LOC~~ SOLN: 0.0000 [IU] | Freq: Three times a day (TID) | SUBCUTANEOUS | Status: DC

## 2015-12-16 MED ORDER — LISINOPRIL-HYDROCHLOROTHIAZIDE 20-12.5 MG PO TABS: 1.0000 | ORAL_TABLET | Freq: Every day | ORAL | Status: DC

## 2015-12-16 MED ORDER — PROPOFOL 10 MG/ML IV BOLUS
INTRAVENOUS | Status: DC | PRN
  Administered 2015-12-16: 200 mg via INTRAVENOUS
  Administered 2015-12-16: 50 mg via INTRAVENOUS

## 2015-12-16 MED ORDER — ADULT MULTIVITAMIN W/MINERALS CH
1.0000 | ORAL_TABLET | Freq: Every day | ORAL | Status: DC
  Administered 2015-12-17 – 2015-12-20 (×4): 1 via ORAL
  Filled 2015-12-16 (×4): qty 1

## 2015-12-16 MED ORDER — ONDANSETRON HCL 4 MG/2ML IJ SOLN
INTRAMUSCULAR | Status: DC | PRN
  Administered 2015-12-16: 4 mg via INTRAVENOUS

## 2015-12-16 MED ORDER — PROPOFOL 10 MG/ML IV BOLUS
INTRAVENOUS | Status: AC
  Filled 2015-12-16: qty 20

## 2015-12-16 MED ORDER — ONDANSETRON HCL 4 MG/2ML IJ SOLN
INTRAMUSCULAR | Status: AC
  Filled 2015-12-16: qty 2

## 2015-12-16 MED ORDER — MIDAZOLAM HCL 5 MG/5ML IJ SOLN
INTRAMUSCULAR | Status: DC | PRN
  Administered 2015-12-16: 2 mg via INTRAVENOUS

## 2015-12-16 MED ORDER — LIDOCAINE HCL (CARDIAC) 20 MG/ML IV SOLN
INTRAVENOUS | Status: AC
Start: 2015-12-16 — End: 2015-12-16
  Filled 2015-12-16: qty 5

## 2015-12-16 MED ORDER — 0.9 % SODIUM CHLORIDE (POUR BTL) OPTIME
TOPICAL | Status: DC | PRN
  Administered 2015-12-16: 1000 mL

## 2015-12-16 MED ORDER — PANTOPRAZOLE SODIUM 40 MG PO TBEC
40.0000 mg | DELAYED_RELEASE_TABLET | Freq: Every day | ORAL | Status: DC
  Administered 2015-12-17 – 2015-12-20 (×4): 40 mg via ORAL
  Filled 2015-12-16: qty 1
  Filled 2015-12-16: qty 2
  Filled 2015-12-16 (×2): qty 1

## 2015-12-16 MED ORDER — ALUM & MAG HYDROXIDE-SIMETH 200-200-20 MG/5ML PO SUSP: 15.0000 mL | ORAL | Status: DC | PRN

## 2015-12-16 MED ORDER — HYDROMORPHONE 1 MG/ML IV SOLN
INTRAVENOUS | Status: AC
  Filled 2015-12-16: qty 25

## 2015-12-16 MED ORDER — HYDROMORPHONE HCL 1 MG/ML IJ SOLN
INTRAMUSCULAR | Status: AC
  Administered 2015-12-16: 0.5 mg via INTRAVENOUS
  Filled 2015-12-16: qty 1

## 2015-12-16 MED ORDER — SODIUM CHLORIDE 0.9 % IJ SOLN
INTRAMUSCULAR | Status: AC
  Filled 2015-12-16: qty 10

## 2015-12-16 MED ORDER — SUCCINYLCHOLINE CHLORIDE 20 MG/ML IJ SOLN
INTRAMUSCULAR | Status: AC
Start: 2015-12-16 — End: 2015-12-16
  Filled 2015-12-16: qty 1

## 2015-12-16 MED ORDER — FENTANYL CITRATE (PF) 250 MCG/5ML IJ SOLN
INTRAMUSCULAR | Status: AC
Start: 2015-12-16 — End: 2015-12-16
  Filled 2015-12-16: qty 5

## 2015-12-16 MED ORDER — ROCURONIUM BROMIDE 50 MG/5ML IV SOLN
INTRAVENOUS | Status: AC
  Filled 2015-12-16: qty 1

## 2015-12-16 MED ORDER — DIAZEPAM 5 MG/ML IJ SOLN
INTRAMUSCULAR | Status: AC
  Administered 2015-12-16: 5 mg via INTRAVENOUS
  Filled 2015-12-16: qty 2

## 2015-12-16 MED ORDER — ASPIRIN 81 MG PO CHEW
81.0000 mg | CHEWABLE_TABLET | Freq: Every day | ORAL | Status: DC
  Administered 2015-12-16 – 2015-12-20 (×5): 81 mg via ORAL
  Filled 2015-12-16 (×5): qty 1

## 2015-12-16 MED ORDER — FENTANYL CITRATE (PF) 250 MCG/5ML IJ SOLN
INTRAMUSCULAR | Status: DC | PRN
  Administered 2015-12-16: 50 ug via INTRAVENOUS
  Administered 2015-12-16: 25 ug via INTRAVENOUS
  Administered 2015-12-16: 50 ug via INTRAVENOUS
  Administered 2015-12-16: 25 ug via INTRAVENOUS
  Administered 2015-12-16: 50 ug via INTRAVENOUS
  Administered 2015-12-16: 100 ug via INTRAVENOUS
  Administered 2015-12-16 (×2): 50 ug via INTRAVENOUS
  Administered 2015-12-16 (×2): 25 ug via INTRAVENOUS

## 2015-12-16 MED ORDER — GUAIFENESIN-DM 100-10 MG/5ML PO SYRP: 15.0000 mL | ORAL_SOLUTION | ORAL | Status: DC | PRN

## 2015-12-16 MED ORDER — PROMETHAZINE HCL 25 MG/ML IJ SOLN
6.2500 mg | INTRAMUSCULAR | Status: DC | PRN
Start: 2015-12-16 — End: 2015-12-16

## 2015-12-16 MED ORDER — HYDROMORPHONE HCL 1 MG/ML IJ SOLN
0.2500 mg | INTRAMUSCULAR | Status: DC | PRN
  Administered 2015-12-16 (×4): 0.5 mg via INTRAVENOUS

## 2015-12-16 MED ORDER — PHENYLEPHRINE 40 MCG/ML (10ML) SYRINGE FOR IV PUSH (FOR BLOOD PRESSURE SUPPORT)
PREFILLED_SYRINGE | INTRAVENOUS | Status: AC
  Filled 2015-12-16: qty 10

## 2015-12-16 MED ORDER — ACETAMINOPHEN 325 MG RE SUPP: 325.0000 mg | RECTAL | Status: DC | PRN

## 2015-12-16 MED ORDER — DEXTROSE 5 % IV SOLN
INTRAVENOUS | Status: AC
  Filled 2015-12-16: qty 1.5

## 2015-12-16 MED ORDER — HYDROCHLOROTHIAZIDE 12.5 MG PO CAPS
12.5000 mg | ORAL_CAPSULE | Freq: Every day | ORAL | Status: DC
Start: 2015-12-17 — End: 2015-12-20
  Administered 2015-12-17 – 2015-12-20 (×4): 12.5 mg via ORAL
  Filled 2015-12-16 (×4): qty 1

## 2015-12-16 MED ORDER — LISINOPRIL 10 MG PO TABS
20.0000 mg | ORAL_TABLET | Freq: Every day | ORAL | Status: DC
  Administered 2015-12-17 – 2015-12-20 (×4): 20 mg via ORAL
  Filled 2015-12-16 (×4): qty 2

## 2015-12-16 MED ORDER — POTASSIUM CHLORIDE CRYS ER 20 MEQ PO TBCR: 20.0000 meq | EXTENDED_RELEASE_TABLET | Freq: Every day | ORAL | Status: DC | PRN

## 2015-12-16 MED ORDER — LACTATED RINGERS IV SOLN
INTRAVENOUS | Status: DC | PRN
  Administered 2015-12-16: 07:00:00 via INTRAVENOUS

## 2015-12-16 MED ORDER — METOPROLOL TARTRATE 1 MG/ML IV SOLN: 2.0000 mg | INTRAVENOUS | Status: DC | PRN

## 2015-12-16 MED ORDER — FENTANYL CITRATE (PF) 250 MCG/5ML IJ SOLN
INTRAMUSCULAR | Status: AC
  Filled 2015-12-16: qty 5

## 2015-12-16 MED ORDER — LABETALOL HCL 5 MG/ML IV SOLN: 10.0000 mg | INTRAVENOUS | Status: DC | PRN

## 2015-12-16 MED ORDER — CYCLOBENZAPRINE HCL 10 MG PO TABS
10.0000 mg | ORAL_TABLET | Freq: Three times a day (TID) | ORAL | Status: DC | PRN
  Administered 2015-12-16 – 2015-12-19 (×8): 10 mg via ORAL
  Filled 2015-12-16 (×8): qty 1

## 2015-12-16 MED ORDER — EPHEDRINE SULFATE 50 MG/ML IJ SOLN
INTRAMUSCULAR | Status: AC
  Filled 2015-12-16: qty 1

## 2015-12-16 MED ORDER — PHENYLEPHRINE HCL 10 MG/ML IJ SOLN
INTRAMUSCULAR | Status: DC | PRN
  Administered 2015-12-16 (×3): 80 ug via INTRAVENOUS

## 2015-12-16 MED ORDER — PHENOL 1.4 % MT LIQD
1.0000 | OROMUCOSAL | Status: DC | PRN
Start: 2015-12-16 — End: 2015-12-20

## 2015-12-16 SURGICAL SUPPLY — 45 items
BANDAGE ACE 4X5 VEL STRL LF (GAUZE/BANDAGES/DRESSINGS) ×1 IMPLANT
BANDAGE ACE 6X5 VEL STRL LF (GAUZE/BANDAGES/DRESSINGS) ×1 IMPLANT
BANDAGE ELASTIC 4 VELCRO ST LF (GAUZE/BANDAGES/DRESSINGS) ×1 IMPLANT
BANDAGE ELASTIC 6 VELCRO ST LF (GAUZE/BANDAGES/DRESSINGS) IMPLANT
BANDAGE ESMARK 6X9 LF (GAUZE/BANDAGES/DRESSINGS) IMPLANT
BNDG CMPR 9X6 STRL LF SNTH (GAUZE/BANDAGES/DRESSINGS)
BNDG ESMARK 6X9 LF (GAUZE/BANDAGES/DRESSINGS)
BNDG GAUZE ELAST 4 BULKY (GAUZE/BANDAGES/DRESSINGS) ×1 IMPLANT
CANISTER SUCTION 2500CC (MISCELLANEOUS) ×2 IMPLANT
CLIP LIGATING EXTRA MED SLVR (CLIP) ×2 IMPLANT
CLIP LIGATING EXTRA SM BLUE (MISCELLANEOUS) ×2 IMPLANT
COVER SURGICAL LIGHT HANDLE (MISCELLANEOUS) ×4 IMPLANT
CUFF TOURNIQUET SINGLE 34IN LL (TOURNIQUET CUFF) IMPLANT
CUFF TOURNIQUET SINGLE 44IN (TOURNIQUET CUFF) IMPLANT
DRAIN SNY 10X20 3/4 PERF (WOUND CARE) IMPLANT
DRAPE ORTHO SPLIT 77X108 STRL (DRAPES) ×4
DRAPE PROXIMA HALF (DRAPES) ×2 IMPLANT
DRAPE SURG ORHT 6 SPLT 77X108 (DRAPES) ×2 IMPLANT
ELECT REM PT RETURN 9FT ADLT (ELECTROSURGICAL) ×2
ELECTRODE REM PT RTRN 9FT ADLT (ELECTROSURGICAL) ×1 IMPLANT
EVACUATOR SILICONE 100CC (DRAIN) IMPLANT
GAUZE SPONGE 4X4 12PLY STRL (GAUZE/BANDAGES/DRESSINGS) ×2 IMPLANT
GAUZE XEROFORM 5X9 LF (GAUZE/BANDAGES/DRESSINGS) ×2 IMPLANT
GLOVE BIO SURGEON STRL SZ 6.5 (GLOVE) ×2 IMPLANT
GLOVE BIOGEL PI IND STRL 7.0 (GLOVE) IMPLANT
GLOVE BIOGEL PI INDICATOR 7.0 (GLOVE) ×2
GLOVE SS BIOGEL STRL SZ 7.5 (GLOVE) ×1 IMPLANT
GLOVE SUPERSENSE BIOGEL SZ 7.5 (GLOVE) ×1
GOWN STRL REUS W/ TWL LRG LVL3 (GOWN DISPOSABLE) ×3 IMPLANT
GOWN STRL REUS W/TWL LRG LVL3 (GOWN DISPOSABLE) ×4
KIT BASIN OR (CUSTOM PROCEDURE TRAY) ×2 IMPLANT
KIT ROOM TURNOVER OR (KITS) ×2 IMPLANT
NS IRRIG 1000ML POUR BTL (IV SOLUTION) ×2 IMPLANT
PACK GENERAL/GYN (CUSTOM PROCEDURE TRAY) ×2 IMPLANT
PAD ARMBOARD 7.5X6 YLW CONV (MISCELLANEOUS) ×4 IMPLANT
PADDING CAST COTTON 6X4 STRL (CAST SUPPLIES) IMPLANT
SAW GIGLI STERILE 20 (MISCELLANEOUS) ×2 IMPLANT
STAPLER VISISTAT 35W (STAPLE) ×2 IMPLANT
STOCKINETTE IMPERVIOUS LG (DRAPES) ×2 IMPLANT
SUT ETHILON 3 0 PS 1 (SUTURE) IMPLANT
SUT VIC AB 0 CT1 18XCR BRD 8 (SUTURE) ×2 IMPLANT
SUT VIC AB 0 CT1 8-18 (SUTURE) ×4
SUT VICRYL AB 2 0 TIES (SUTURE) ×2 IMPLANT
UNDERPAD 30X30 INCONTINENT (UNDERPADS AND DIAPERS) ×2 IMPLANT
WATER STERILE IRR 1000ML POUR (IV SOLUTION) ×2 IMPLANT

## 2015-12-16 NOTE — Plan of Care (Signed)
Problem: Pain Managment: Goal: General experience of comfort will improve Outcome: Progressing PT continues on PCA pump, pain is being managed by Dilaudid via PCA pump

## 2015-12-16 NOTE — Interval H&P Note (Signed)
History and Physical Interval Note:  12/16/2015 7:10 AM  Adam [name]  has presented today for surgery, with the diagnosis of right foot gangrene  The various methods of treatment have been discussed with the patient and family. After consideration of risks, benefits and other options for treatment, the patient has consented to  Procedure(s): AMPUTATION BELOW KNEE (Right) as a surgical intervention .  The patient's history has been reviewed, patient examined, no change in status, stable for surgery.  I have reviewed the patient's chart and labs.  Questions were answered to the patient's satisfaction.     [name]

## 2015-12-16 NOTE — Anesthesia Procedure Notes (Signed)
Procedure Name: LMA Insertion Date/Time: 12/16/2015 7:33 AM Performed by: Julian Reil Pre-anesthesia Checklist: Patient identified, Emergency Drugs available, Suction available and Patient being monitored Patient Re-evaluated:Patient Re-evaluated prior to inductionOxygen Delivery Method: Circle system utilized Preoxygenation: Pre-oxygenation with 100% oxygen Intubation Type: IV induction Ventilation: Mask ventilation without difficulty LMA: LMA inserted LMA Size: 4.0 Tube type: Oral Number of attempts: 1 Placement Confirmation: positive ETCO2 and breath sounds checked- equal and bilateral Tube secured with: Tape Dental Injury: Teeth and Oropharynx as per pre-operative assessment

## 2015-12-16 NOTE — Progress Notes (Signed)
Patient Hydromorphone dilaudid PCA Pump D/C per OR RN request. Patient scheduled for right BKA this AM, Dilaudid 65ml wasted in Sink and witnessed by Charge Nurse this shift/Laura Bruce.

## 2015-12-16 NOTE — Transfer of Care (Signed)
Immediate Anesthesia Transfer of Care Note  Patient: Adam [name]  Procedure(s) Performed: Procedure(s): AMPUTATION BELOW KNEE (Right)  Patient Location: PACU  Anesthesia Type:General  Level of Consciousness: awake, alert , oriented and patient cooperative  Airway & Oxygen Therapy: Patient Spontanous Breathing and Patient connected to nasal cannula oxygen  Post-op Assessment: Report given to RN, Post -op Vital signs reviewed and stable and Patient moving all extremities  Post vital signs: Reviewed and stable  Last Vitals:  Filed Vitals:   12/16/15 0400 12/16/15 0545  BP:  132/57  Pulse:  64  Temp:  36.8 C  Resp: 18 18    Complications: No apparent anesthesia complications

## 2015-12-16 NOTE — Anesthesia Postprocedure Evaluation (Signed)
Anesthesia Post Note  Patient: Adam [name]  Procedure(s) Performed: Procedure(s) (LRB): AMPUTATION BELOW KNEE (Right)  Patient location during evaluation: PACU Anesthesia Type: General Level of consciousness: awake Pain management: pain level controlled Vital Signs Assessment: post-procedure vital signs reviewed and stable Respiratory status: spontaneous breathing Cardiovascular status: stable Anesthetic complications: no    Last Vitals:  Filed Vitals:   12/16/15 1151 12/16/15 1209  BP:    Pulse:    Temp:    Resp: 14 19    Last Pain:  Filed Vitals:   12/16/15 1218  PainSc: 10-Worst pain ever                 EDWARDS,Joselyne Spake

## 2015-12-16 NOTE — Op Note (Signed)
    OPERATIVE REPORT  DATE OF SURGERY: 12/16/2015  PATIENT: Adam [name], 65 y.o. male MRN: [id number]  DOB: Jun 22, 1951  PRE-OPERATIVE DIAGNOSIS: Severe right foot ischemia with nonhealing great toe amputation  POST-OPERATIVE DIAGNOSIS:  Same  PROCEDURE: Right below-knee amputation  SURGEON:  [name], M.D.  PHYSICIAN ASSISTANT: Nurse  ANESTHESIA:  Gen.  EBL: 100 ml  Total I/O In: 700 [I.V.:700] Out: 100 [Blood:100]  BLOOD ADMINISTERED: None  DRAINS: None  SPECIMEN: Right below-knee amputation  COUNTS CORRECT:  YES  PLAN OF CARE: PACU   PATIENT DISPOSITION:  PACU - hemodynamically stable  PROCEDURE DETAILS: The patient was taken to the operating placed supine position where the area of the right leg and foot were prepped and draped in usual sterile fashion. Using a posterior-based muscle flap and incision was made several fingerbreadths below the prominence of the tibia. This was carried down through the fascia and through the muscle on the anterior portion with electrocautery. The muscle all appeared to be viable although the anterior muscle bodies had less contraction. The anterior tibial neurovascular bundle was ligated with 0 Vicral ties and divided. The gastrocnemius muscle was brought up with electrocautery loop using a posterior-based myocutaneous flap. Soleus muscle was partially resected. Popliteal artery and vein was ligated and divided. The periosteum was elevated on the tibia and fibula. The tibia and fibula were divided with Gigli saw. The fibula was divided centimeter proximal to the tibia. The bone edges were smoothed with a bone rasp. The wound was irrigated with saline and hemostasis tablet cautery. The wound was closed with 0 Vicryls figure-of-eight sutures to reapproximate the posterior flap to the anterior flap. The skin was closed with skin staples. Sterile dressing was applied the patient was transferred to the recovery room in stable  condition   [name], M.D. 12/16/2015 9:26 AM

## 2015-12-16 NOTE — Anesthesia Preprocedure Evaluation (Addendum)
Anesthesia Evaluation  Patient identified by MRN, date of birth, ID band Patient awake    Reviewed: Allergy & Precautions, NPO status , Patient's Chart, lab work & pertinent test results  Airway Mallampati: II  TM Distance: >3 FB Neck ROM: Full    Dental   Pulmonary sleep apnea , former smoker,    breath sounds clear to auscultation       Cardiovascular hypertension, + Peripheral Vascular Disease   Rhythm:Regular Rate:Normal     Neuro/Psych    GI/Hepatic negative GI ROS, Neg liver ROS,   Endo/Other  diabetes  Renal/GU negative Renal ROS     Musculoskeletal   Abdominal   Peds  Hematology   Anesthesia Other Findings   Reproductive/Obstetrics                            Anesthesia Physical Anesthesia Plan  ASA: III  Anesthesia Plan: General   Post-op Pain Management:    Induction: Intravenous  Airway Management Planned: LMA  Additional Equipment:   Intra-op Plan:   Post-operative Plan: Extubation in OR  Informed Consent: I have reviewed the patients History and Physical, chart, labs and discussed the procedure including the risks, benefits and alternatives for the proposed anesthesia with the patient or authorized representative who has indicated his/her understanding and acceptance.   Dental advisory given  Plan Discussed with: Anesthesiologist and CRNA  Anesthesia Plan Comments:         Anesthesia Quick Evaluation

## 2015-12-17 ENCOUNTER — Encounter (HOSPITAL_COMMUNITY): Payer: Self-pay | Admitting: Vascular Surgery

## 2015-12-17 DIAGNOSIS — E1142 Type 2 diabetes mellitus with diabetic polyneuropathy: Secondary | ICD-10-CM

## 2015-12-17 DIAGNOSIS — D62 Acute posthemorrhagic anemia: Secondary | ICD-10-CM

## 2015-12-17 DIAGNOSIS — Z89511 Acquired absence of right leg below knee: Secondary | ICD-10-CM | POA: Insufficient documentation

## 2015-12-17 DIAGNOSIS — R269 Unspecified abnormalities of gait and mobility: Secondary | ICD-10-CM | POA: Insufficient documentation

## 2015-12-17 DIAGNOSIS — R6510 Systemic inflammatory response syndrome (SIRS) of non-infectious origin without acute organ dysfunction: Secondary | ICD-10-CM | POA: Insufficient documentation

## 2015-12-17 DIAGNOSIS — G8918 Other acute postprocedural pain: Secondary | ICD-10-CM

## 2015-12-17 DIAGNOSIS — I998 Other disorder of circulatory system: Secondary | ICD-10-CM | POA: Insufficient documentation

## 2015-12-17 DIAGNOSIS — I10 Essential (primary) hypertension: Secondary | ICD-10-CM

## 2015-12-17 LAB — CBC
HEMATOCRIT: 38.2 % — AB (ref 39.0–52.0)
Hemoglobin: 12.6 g/dL — ABNORMAL LOW (ref 13.0–17.0)
MCH: 30.8 pg (ref 26.0–34.0)
MCHC: 33 g/dL (ref 30.0–36.0)
MCV: 93.4 fL (ref 78.0–100.0)
Platelets: 327 10*3/uL (ref 150–400)
RBC: 4.09 MIL/uL — AB (ref 4.22–5.81)
RDW: 12.2 % (ref 11.5–15.5)
WBC: 11.8 10*3/uL — AB (ref 4.0–10.5)

## 2015-12-17 LAB — GLUCOSE, CAPILLARY
GLUCOSE-CAPILLARY: 104 mg/dL — AB (ref 65–99)
GLUCOSE-CAPILLARY: 112 mg/dL — AB (ref 65–99)
GLUCOSE-CAPILLARY: 111 mg/dL — AB (ref 65–99)
Glucose-Capillary: 91 mg/dL (ref 65–99)

## 2015-12-17 LAB — BASIC METABOLIC PANEL
Anion gap: 9 (ref 5–15)
BUN: 9 mg/dL (ref 6–20)
CHLORIDE: 98 mmol/L — AB (ref 101–111)
CO2: 30 mmol/L (ref 22–32)
Calcium: 9.3 mg/dL (ref 8.9–10.3)
Creatinine, Ser: 0.72 mg/dL (ref 0.61–1.24)
GFR calc non Af Amer: >60 mL/min (ref >60)
Glucose, Bld: 138 mg/dL — ABNORMAL HIGH (ref 65–99)
POTASSIUM: 3.9 mmol/L (ref 3.5–5.1)
SODIUM: 137 mmol/L (ref 135–145)

## 2015-12-17 MED ORDER — OXYCODONE HCL 5 MG PO TABS
10.0000 mg | ORAL_TABLET | Freq: Four times a day (QID) | ORAL | Status: DC
Start: 2015-12-17 — End: 2015-12-18
  Administered 2015-12-17 – 2015-12-18 (×3): 20 mg via ORAL
  Filled 2015-12-17 (×3): qty 4

## 2015-12-17 MED ORDER — OXYCODONE HCL 5 MG PO TABS
10.0000 mg | ORAL_TABLET | Freq: Four times a day (QID) | ORAL | Status: DC | PRN
  Filled 2015-12-17: qty 4

## 2015-12-17 NOTE — Progress Notes (Signed)
Occupational Therapy Treatment Patient Details Name: Adam [name] MRN: [id number] DOB: 1951-03-13 Today's Date: 12/17/2015    History of present illness Patient is a 65 y/o male admitted with severe R foot pain.  He underwent right femoral to below-knee popliteal bypass on 11/28/2015. He underwent amputation of his right great toe on 12/01/2015.  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  PMH positive for NIDDM, PVD, HTN, sleep apnea.   OT comments  Nursing found this therapist to assist with getting patient back to bed. Took opportunity to continue education on safe and effective sit to/from stand transfers, stand pivot transfers, safety with RW, and bed mobility. Pt requires increased time and moderate cueing for safety with use of RW at this time. +2 helpful due to lines and patient's increased pain. Pt did demonstrate a posterior lean in standing and required verbal and tactile cues to correct. Pt with poor safety awareness with use of RW, wanting to sit down before safe and appropriate to sit.    Follow Up Recommendations  CIR;Supervision/Assistance - 24 hour    Equipment Recommendations  Other (comment) (TBD next venue of care)    Recommendations for Other Services  None at this time   Precautions / Restrictions Precautions Precautions: Fall Restrictions Weight Bearing Restrictions: Yes RLE Weight Bearing: Non weight bearing    Mobility Bed Mobility Overal bed mobility: Needs Assistance Bed Mobility: Sit to Supine     Supine to sit: HOB elevated;Min assist Sit to supine: Supervision   General bed mobility comments: Supervision for safety, cues for technique   Transfers Overall transfer level: Needs assistance Equipment used: Rolling walker (2 wheeled) Transfers: Sit to/from [facility] Sit to Stand: Min assist;+2 safety/equipment Stand pivot transfers: Min assist;+2 safety/equipment General transfer comment: Cues for safety, hand placement, technique. Pt with posterior  lean in standing and required cues to correct this as well as relax right residual limb in standing. Pt limited by pain.     Balance Overall balance assessment: Needs assistance Sitting-balance support: Bilateral upper extremity supported;Single extremity supported Sitting balance-Leahy Scale: Good   Postural control: Posterior lean (more so in standing) Standing balance support: Bilateral upper extremity supported;During functional activity Standing balance-Leahy Scale: Poor Standing balance comment: UE reliance secondary to R BKA        Cognition   Behavior During Therapy: WFL for tasks assessed/performed Overall Cognitive Status: Within Functional Limits for tasks assessed                Pertinent Vitals/ Pain       Pain Assessment: Faces Pain Score: 10-Worst pain ever Faces Pain Scale: Hurts whole lot Pain Location: RLE Pain Descriptors / Indicators: Other (Comment);Grimacing (phanthom pain) Pain Intervention(s): Monitored during session;Repositioned (assisted back to bed and positioned pillow correctly under right residual limb)         Frequency Min 2X/week     Progress Toward Goals  OT Goals(current goals can now be found in the care plan section)  Progress towards OT goals: Progressing toward goals  Acute Rehab OT Goals Patient Stated Goal: to go to rehab OT Goal Formulation: With patient/family Time For Goal Achievement: 12/31/15 Potential to Achieve Goals: Good ADL Goals Pt Will Perform Grooming: with min assist;standing Pt Will Perform Lower Body Bathing: with min assist;sit to/from stand;with adaptive equipment Pt Will Perform Lower Body Dressing: with min assist;sit to/from stand;with adaptive equipment Pt Will Transfer to Toilet: with min assist;ambulating;bedside commode  Plan Discharge plan remains appropriate    End  of Session Equipment Utilized During Treatment: Gait belt;Rolling walker;Oxygen   Activity Tolerance Patient tolerated treatment  well   Patient Left with family/visitor present;in bed;with bed alarm set;with call bell/phone within reach;with nursing/sitter in room   Nurse Communication Mobility status;Weight bearing status     Time: [id number] OT Time Calculation (min): 8 min  Charges: OT General Charges $OT Visit: 1 Procedure OT Evaluation $OT Eval Moderate Complexity: 1 Procedure OT Treatments $Therapeutic Activity: 8-22 mins  Chrys Racer , MS, OTR/L, CLT Pager: [phone]  12/17/2015, 12:18 PM

## 2015-12-17 NOTE — Progress Notes (Signed)
MEDICATION RELATED CONSULT NOTE - FOLLOW UP   Pharmacy Consult for Pain management Indication:   No Known Allergies  Patient Measurements: Height: 5\' 11"  (180.3 cm) Weight: 179 lb 7.3 oz (81.4 kg) IBW/kg (Calculated) : 75.3 Adjusted Body Weight:   Vital Signs:   Intake/Output from previous day: 01/29 0701 - 01/30 0700 In: 1180 [P.O.:480; I.V.:700] Out: 1350 [Urine:1250; Blood:100] Intake/Output from this shift: Total I/O In: 240 [P.O.:240] Out: 500 [Urine:500]  Labs:  Recent Labs  12/15/15 1000 12/16/15 0322 12/17/15 0430  WBC 10.1 9.1 11.8*  HGB 13.2 12.0* 12.6*  HCT 39.9 37.4* 38.2*  PLT 368 356 327  CREATININE 0.81 0.72 0.72   Estimated Creatinine Clearance: 98 mL/min (by C-G formula based on Cr of 0.72).   Microbiology: Recent Results (from the past 720 hour(s))  Surgical pcr screen     Status: Abnormal   Collection Time: 11/28/15  7:45 AM  Result Value Ref Range Status   MRSA, PCR NEGATIVE NEGATIVE Final   Staphylococcus aureus POSITIVE (A) NEGATIVE Final    Comment:        The Xpert SA Assay (FDA approved for NASAL specimens in patients over 78 years of age), is one component of a comprehensive surveillance program.  Test performance has been validated by [facility] for patients greater than or equal to 35 year old. It is not intended to diagnose infection nor to guide or monitor treatment.     Assessment: 65 yo who came in 1/28 for extreme foot pain with gangrenous changes to the great right toe a few weeks ago. He got an amputation of the toe 1/14. He now has ongoing critical ischemia. He is being admitted for pain control now and amputation 1/29. Pharmacy consulted to assist with pain control. We are going to use dilaudid PCA. I'm going to add a basal rate to the PCA to get the acute pain in control.   1/30: CrCl 98. Ancef dose ok. Pain scores today 5, 7, 7, 10, 10 but has been undergoing PT and OT just before lunch. PCA boluses have been 0.5  up to 8mg  in the last 24h (total 23.67mg  charted) . Temp 101.6. WBC 11.8 up. HR 72-91, Continued infection vs signs of severe pain distress? Will add Oxy IR 10-20 q6hr prn to evaluate how much this may cut back on his PCA usage in preparation to covert to oral medications. RN reports RR around 30 and patient crying.  Goal of Therapy:  Improved pain scores  Plan:  Continue PCA with no current changes Will add Oxy IR 10-20 q6hr prn to evaluate how much this may cut back on his PCA usage in preparation to covert to oral medications as soon as possible.   Rochelle Larue S. Alford Highland, PharmD, BCPS Clinical Staff Pharmacist Pager [phone]  [place], [place] 12/17/2015,2:03 PM

## 2015-12-17 NOTE — Consult Note (Signed)
Physical Medicine and Rehabilitation Consult Reason for Consult: Right BKA Referring Physician: Dr. Donnetta Hutching   HPI: Adam [name] is a 65 y.o. right handed male with history of remote tobacco abuse, hypertension, diabetes mellitus and peripheral neuropathy, peripheral vascular disease status post right femoral-popliteal artery bypass 11/28/2015 and gangrenous right great toe status post right great toe amputation 12/01/2015. Patient lives with his wife. Independent prior to admission working full time up until latest femoral bypass surgery. 2 level home with 2 steps to entry and bedroom upstairs. Wife can assist as needed. Presented 12/15/2015 with severe right foot pain and poor healing of recent right great toe amputation with profound ischemic changes. Limb was not felt to be salvageable and underwent right below-knee amputation 12/16/2015 per Dr. Donnetta Hutching. Hospital course pain management. Subcutaneous Lovenox for DVT prophylaxis. Physical and occupational therapy evaluations pending. M.D. has requested physical medicine rehabilitation consult.   Review of Systems  Constitutional: Negative for fever and chills.  HENT: Negative for hearing loss.   Eyes: Negative for blurred vision and double vision.  Respiratory: Negative for cough and shortness of breath.   Cardiovascular: Positive for leg swelling. Negative for chest pain and palpitations.  Gastrointestinal: Positive for constipation. Negative for nausea and vomiting.  Genitourinary: Negative for dysuria and hematuria.  Musculoskeletal: Positive for myalgias and joint pain.       Severe right foot pain  Skin: Negative for rash.  Neurological: Negative for seizures, loss of consciousness and headaches.  All other systems reviewed and are negative.  Past Medical History  Diagnosis Date  . Diabetes mellitus without complication ([place])   . Hypertension   . Peripheral vascular disease ([place])   . Gangrene ([place]) 11/22/2015    right great  toe  . Sleep apnea     uses cpap   Past Surgical History  Procedure Laterality Date  . Peripheral vascular catheterization N/A 11/26/2015    Procedure: Abdominal Aortogram;  Surgeon: [name], MD;  Location: [facility];  Service: Cardiovascular;  Laterality: N/A;  . Hernia repair Bilateral 2000    inguinal  . Vasectomy    . Colonoscopy    . Femoral-popliteal bypass graft Right 11/28/2015    Procedure: RIGHT  FEMORAL-POPLITEAL ARTERY BYPASS GRAFT AND CONSTRUCTION OF MILLER CUFF USING 6 MM X 80 CM PROPATEN  GRAFT ;  Surgeon: Conrad Hardtner, MD;  Location: [facility];  Service: Vascular;  Laterality: Right;  . Amputation Right 12/01/2015    Procedure: AMPUTATION RIGHT GREAT;  Surgeon: Conrad East Bernard, MD;  Location: [facility];  Service: Vascular;  Laterality: Right;   Family History  Problem Relation Age of Onset  . Hypertension Mother    Social History:  reports that he quit smoking about 4 years ago. He has never used smokeless tobacco. He reports that he drinks about 4.2 oz of alcohol per week. He reports that he does not use illicit drugs. Allergies: No Known Allergies Medications Prior to Admission  Medication Sig Dispense Refill  . amoxicillin-clavulanate (AUGMENTIN) 875-125 MG tablet Take 1 tablet by mouth 2 (two) times daily. 20 tablet 0  . aspirin 81 MG tablet Take 81 mg by mouth daily.    Marland Kitchen atorvastatin (LIPITOR) 10 MG tablet Take 1 tablet (10 mg total) by mouth daily. (Patient taking differently: Take 10 mg by mouth every other day. ) 90 tablet 3  . docusate sodium (COLACE) 100 MG capsule Take 100 mg by mouth 2 (two) times daily as  needed for mild constipation.    Marland Kitchen lisinopril-hydrochlorothiazide (PRINZIDE,ZESTORETIC) 20-12.5 MG per tablet Take 1 tablet by mouth daily. 90 tablet 3  . Multiple Vitamin (MULTIVITAMIN) tablet Take 1 tablet by mouth daily.    Marland Kitchen oxyCODONE 10 MG TABS Take 1 tablet (10 mg total) by mouth every 4 (four) hours as needed for moderate pain. (Patient  taking differently: Take 15 mg by mouth every 4 (four) hours as needed for moderate pain. ) 50 tablet 0  . oxyCODONE-acetaminophen (PERCOCET) 10-325 MG tablet Take 1 tablet by mouth every 4 (four) hours as needed for pain. 30 tablet 0  . oxyCODONE-acetaminophen (PERCOCET/ROXICET) 5-325 MG tablet Take 1-2 tablets by mouth every 6 (six) hours as needed for moderate pain. 30 tablet 0  . Blood Glucose Monitoring Suppl (ONE TOUCH ULTRA MINI) w/Device KIT     . ONE TOUCH ULTRA TEST test strip     . ONETOUCH DELICA LANCETS 09F MISC     . traMADol (ULTRAM) 50 MG tablet Take 1 tablet (50 mg total) by mouth every 8 (eight) hours as needed. (Patient not taking: Reported on 12/15/2015) 30 tablet 0    Home: Home Living Family/patient expects to be discharged to:: Private residence Living Arrangements: Spouse/significant other  Functional History:   Functional Status:  Mobility:          ADL:    Cognition: Cognition Orientation Level: Oriented X4    Blood pressure 176/77, pulse 74, temperature 99.5 F (37.5 C), temperature source Oral, resp. rate 18, height '5\' 11"'$  (1.803 m), weight 81.4 kg (179 lb 7.3 oz), SpO2 99 %. Physical Exam  Vitals reviewed. Constitutional: He is oriented to person, place, and time. He appears well-developed and well-nourished.  HENT:  Head: Normocephalic and atraumatic.  Eyes: Conjunctivae and EOM are normal.  Neck: Normal range of motion. Neck supple. No thyromegaly present.  Cardiovascular: Normal rate, regular rhythm and intact distal pulses.   Murmur heard. Respiratory: Effort normal and breath sounds normal. No respiratory distress.  GI: Soft. Bowel sounds are normal. He exhibits no distension.  Musculoskeletal: He exhibits edema (RLE) and tenderness (RLE).  Neurological: He is alert and oriented to person, place, and time.  Sensation intact to light touch Motor: B/l UE, LLE 5/5 RLE: hip flexion 3-/5 (pain inhibition)  Skin: Skin is warm and dry.  Right  BKA site is dressed appropriately tender  Psychiatric: He has a normal mood and affect. His behavior is normal.    Results for orders placed or performed during the hospital encounter of 12/15/15 (from the past 24 hour(s))  Glucose, capillary     Status: None   Collection Time: 12/16/15  9:12 AM  Result Value Ref Range   Glucose-Capillary 96 65 - 99 mg/dL   Comment 1 Notify RN    Comment 2 Document in Chart   Glucose, capillary     Status: Abnormal   Collection Time: 12/16/15 11:26 AM  Result Value Ref Range   Glucose-Capillary 120 (H) 65 - 99 mg/dL  Glucose, capillary     Status: Abnormal   Collection Time: 12/16/15  4:30 PM  Result Value Ref Range   Glucose-Capillary 134 (H) 65 - 99 mg/dL  Glucose, capillary     Status: Abnormal   Collection Time: 12/16/15  9:10 PM  Result Value Ref Range   Glucose-Capillary 118 (H) 65 - 99 mg/dL   Comment 1 Notify RN    Comment 2 Document in Chart   Basic metabolic panel  Status: Abnormal   Collection Time: 12/17/15  4:30 AM  Result Value Ref Range   Sodium 137 135 - 145 mmol/L   Potassium 3.9 3.5 - 5.1 mmol/L   Chloride 98 (L) 101 - 111 mmol/L   CO2 30 22 - 32 mmol/L   Glucose, Bld 138 (H) 65 - 99 mg/dL   BUN 9 6 - 20 mg/dL   Creatinine, Ser 0.72 0.61 - 1.24 mg/dL   Calcium 9.3 8.9 - 10.3 mg/dL   GFR calc non Af Amer >60 >60 mL/min   GFR calc Af Amer >60 >60 mL/min   Anion gap 9 5 - 15  CBC     Status: Abnormal   Collection Time: 12/17/15  4:30 AM  Result Value Ref Range   WBC 11.8 (H) 4.0 - 10.5 K/uL   RBC 4.09 (L) 4.22 - 5.81 MIL/uL   Hemoglobin 12.6 (L) 13.0 - 17.0 g/dL   HCT 38.2 (L) 39.0 - 52.0 %   MCV 93.4 78.0 - 100.0 fL   MCH 30.8 26.0 - 34.0 pg   MCHC 33.0 30.0 - 36.0 g/dL   RDW 12.2 11.5 - 15.5 %   Platelets 327 150 - 400 K/uL   No results found.  Assessment/Plan: Diagnosis: Right BKA Labs independently reviewed.  Records reviewed and summated above. PT/OT for mobility, ADL's, strengthening,and wheelchair  training Clean amputation daily with soap and water Monitor incision site for signs of infection or impending skin breakdown. Staples to remain in place for 3-4 weeks Stump shrinker, for edema control  Scar mobilization massaging to prevent soft tissue adherence Stump protector during therapies Prevent flexion contractures by implementing the following:   Encourage prone lying for 20-30 mins per day BID to avoid hip flexion  Contractures if medically appropriate;  Avoid pillow under knees when patient is lying in bed in order to prevent both  knee and hip flexion contractures;  Avoid prolonged sitting Post surgical pain control with oral medication Phantom limb pain control with physical modalities including desensitization techniques (gentle self massage to the residual stump,hot packs if sensation iintact, [place]) and mirror therapy, TENS. If ineffective, consider pharmacological treatment for neuropathic pain (e.g gabapentin, pregabalin, amytriptalyine, duloxetine).  When using wheelchair, patient should have knee on amputated side fully extended with board under the seat cushion. Avoid injury to contralateral side  1. Does the need for close, 24 hr/day medical supervision in concert with the patient's rehab needs make it unreasonable for this patient to be served in a less intensive setting? Yes 2. Co-Morbidities requiring supervision/potential complications:  diabetes mellitus and peripheral neuropathy (Monitor in accordance with exercise and adjust meds as necessary), peripheral vascular disease status post right femoral-popliteal artery bypass and right great toe amputation, HTN (monitor and provide prns in accordance with increased physical exertion and pain), SIRS (tachypnea, fevers, leukocytosis - monitor for signs/symptoms of infection and consider additional workup if necessary), post-op pain (Biofeedback training with therapies to help reduce reliance on opiate pain medications, monitor  pain control during therapies, and sedation at rest and titrate to maximum efficacy to ensure participation and gains in therapies), ABLA (transfuse if necessary to ensure appropriate perfusion for increased activity tolerance) 3. Due to safety, skin/wound care, disease management, pain management and patient education, does the patient require 24 hr/day rehab nursing? Yes 4. Does the patient require coordinated care of a physician, rehab nurse, PT (1-2 hrs/day, 5 days/week) and OT (1-2 hrs/day, 5 days/week) to address physical and functional deficits in the context  of the above medical diagnosis(es)? Yes Addressing deficits in the following areas: balance, endurance, locomotion, strength, transferring, bathing, dressing, toileting and psychosocial support 5. Can the patient actively participate in an intensive therapy program of at least 3 hrs of therapy per day at least 5 days per week? Potentially 6. The potential for patient to make measurable gains while on inpatient rehab is excellent 7. Anticipated functional outcomes upon discharge from inpatient rehab are TBD  with PT, TBD with OT, n/a with SLP. 8. Estimated rehab length of stay to reach the above functional goals is: TBD 9. Does the patient have adequate social supports and living environment to accommodate these discharge functional goals? Yes 10. Anticipated D/C setting: Home 11. Anticipated post D/C treatments: HH therapy and Home excercise program 12. Overall Rehab/Functional Prognosis: excellent  RECOMMENDATIONS: This patient's condition is appropriate for continued rehabilitative care in the following setting: Likely CIR, however, will need formal therapy consults and medical stability (improvement in SIRS and pain) Patient has agreed to participate in recommended program. Yes Note that insurance prior authorization may be required for reimbursement for recommended care.  Comment: Rehab Admissions Coordinator to follow up.  Delice Lesch, MD 12/17/2015

## 2015-12-17 NOTE — Care Management Note (Signed)
Case Management Note  Patient Details  Name: Adam [name] MRN: [id number] Date of Birth: 1951/06/19  Subjective/Objective:   Pt admitted with PAD, pt is s/p leg amputation                 Action/Plan:  Pt is independent from home with wife.  Pt recently discharged home from [place] with [facility].  Pt is being considered for CIR with SNF as back up plan once pain is better controlled.  CM will continue to monitor for disposition needs   Expected Discharge Date:                  Expected Discharge Plan:     In-House Referral:     Discharge planning Services  CM Consult  Post Acute Care Choice:    Choice offered to:     DME Arranged:    DME Agency:     HH Arranged:    HH Agency:     Status of Service:  In process, will continue to follow  Medicare Important Message Given:    Date Medicare IM Given:    Medicare IM give by:    Date Additional Medicare IM Given:    Additional Medicare Important Message give by:     If discussed at [place] of Stay Meetings, dates discussed:    Additional Comments:  Maryclare Labrador, RN 12/17/2015, 2:44 PM

## 2015-12-17 NOTE — Evaluation (Signed)
Occupational Therapy Evaluation Patient Details Name: Adam [name] MRN: [id number] DOB: 1951/05/07 Today's Date: 12/17/2015    History of Present Illness Patient is a 65 y/o male admitted with severe R foot pain.  He underwent right femoral to below-knee popliteal bypass on 11/28/2015. He underwent amputation of his right great toe on 12/01/2015.  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  PMH positive for NIDDM, PVD, HTN, sleep apnea.   Clinical Impression   Patient presenting with decreased ADL and functional mobility independence secondary to above and secondary to increased pain. Patient independent to mod I PTA. Patient currently functioning at an overall min +2 assist level (functional mobility) to mod assist level (LB ADLS). Patient will benefit from acute OT to increase overall independence in the areas of ADLs, functional mobility, and overall safety in order to safely discharge to venue listed below.     Follow Up Recommendations  CIR;Supervision/Assistance - 24 hour    Equipment Recommendations  Other (comment) (TBD next venue of care)    Recommendations for Other Services  None at this time    Precautions / Restrictions Precautions Precautions: Fall Restrictions Weight Bearing Restrictions: Yes RLE Weight Bearing: Non weight bearing    Mobility Bed Mobility Overal bed mobility: Needs Assistance Bed Mobility: Supine to Sit     Supine to sit: HOB elevated;Min assist     General bed mobility comments: increased time, heavy UE reliance, cues for technique  Transfers Overall transfer level: Needs assistance Equipment used: Rolling walker (2 wheeled) Transfers: Sit to/from [facility] Sit to Stand: Min assist;+2 safety/equipment Stand pivot transfers: Min assist;+2 safety/equipment       General transfer comment: cues for safety, hand placement; cues for technique to hop around from bed to chair with RW, limited by pain    Balance Overall balance assessment:  Needs assistance Sitting-balance support: Single extremity supported;Bilateral upper extremity supported Sitting balance-Leahy Scale: Good     Standing balance support: Bilateral upper extremity supported;During functional activity Standing balance-Leahy Scale: Poor Standing balance comment: UE reliance secondary to R BKA    ADL Overall ADL's : Needs assistance/impaired Eating/Feeding: Sitting;Set up   Grooming: Set up;Sitting   Upper Body Bathing: Set up;Sitting   Lower Body Bathing: Moderate assistance;Sit to/from stand   Upper Body Dressing : Set up;Sitting   Lower Body Dressing: Moderate assistance;Maximal assistance;Sit to/from stand   Toilet Transfer: Minimal assistance;Stand-pivot;BSC;Cueing for safety;+2 for safety/equipment Toilet Transfer Details (indicate cue type and reason): simulated transfer, pt transferred EOB to recliner         Functional mobility during ADLs: Minimal assistance;Rolling walker;+2 for safety/equipment General ADL Comments: Pt educated on not placing a pillow under his knee, instead promoting knee extension while seated in recliner or lying in bed. Pt and wife eager to go to CIR.     Pertinent Vitals/Pain Pain Assessment: 0-10 Pain Score: 10-Worst pain ever Pain Location: RLE Pain Descriptors / Indicators: Other (Comment) (phanthom pain) Pain Intervention(s): Monitored during session;Limited activity within patient's tolerance;PCA encouraged;Repositioned     Hand Dominance Right   Extremity/Trunk Assessment Upper Extremity Assessment Upper Extremity Assessment: Overall WFL for tasks assessed   Lower Extremity Assessment Lower Extremity Assessment: Defer to PT evaluation RLE Deficits / Details: limited by pain, noted bulbous swelling at distal end of BK amputation wrapped with ace bandage; able to extend hip and knee slowly to neutral in standing, lifts leg with assist, noted active quad, glut and adductor sets with exercises RLE:  Unable to fully assess due  to pain   Cervical / Trunk Assessment Cervical / Trunk Assessment: Normal   Communication Communication Communication: No difficulties   Cognition Arousal/Alertness: Awake/alert Behavior During Therapy: WFL for tasks assessed/performed Overall Cognitive Status: Within Functional Limits for tasks assessed              Home Living Family/patient expects to be discharged to:: Private residence Living Arrangements: Spouse/significant other Available Help at Discharge: Family;Available 24 hours/day Type of Home: House Home Access: Stairs to enter [employer] of Steps: 4 from garage Entrance Stairs-Rails: None Home Layout: Two level;Bed/bath upstairs;1/2 bath on main level Alternate Level Stairs-Number of Steps: 15 then landing then 6 more Alternate Level Stairs-Rails: Left Bathroom Shower/Tub: Occupational psychologist: Handicapped height (standard height toilet in half bath downstairs)     Home Equipment: Walker - 2 wheels;Crutches;Shower seat - built in   Prior Functioning/Environment Level of Independence: Independent with assistive device(s)  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    OT Diagnosis: Generalized weakness;Acute pain   OT Problem List: Decreased strength;Decreased activity tolerance;Impaired balance (sitting and/or standing);Decreased knowledge of use of DME or AE;Pain;Impaired sensation;Decreased knowledge of precautions   OT Treatment/Interventions: Self-care/ADL training;DME and/or AE instruction;Patient/family education;Therapeutic exercise;Therapeutic activities;Balance training    OT Goals(Current goals can be found in the care plan section) Acute Rehab OT Goals Patient Stated Goal: to go to rehab OT Goal Formulation: With patient/family Time For Goal Achievement: 12/31/15 Potential to Achieve Goals: Good ADL Goals Pt Will Perform Grooming: with min assist;standing Pt Will Perform Lower  Body Bathing: with min assist;sit to/from stand;with adaptive equipment Pt Will Perform Lower Body Dressing: with min assist;sit to/from stand;with adaptive equipment Pt Will Transfer to Toilet: with min assist;ambulating;bedside commode  OT Frequency: Min 2X/week   Barriers to D/C: Inaccessible home environment       Co-evaluation PT/OT/SLP Co-Evaluation/Treatment: Yes Reason for Co-Treatment: For patient/therapist safety;Other (comment) (increased pain) PT goals addressed during session: Mobility/safety with mobility;Proper use of DME;Strengthening/ROM;Balance OT goals addressed during session: ADL's and self-care;Strengthening/ROM      End of Session Equipment Utilized During Treatment: Gait belt;Rolling walker;Oxygen Nurse Communication: Mobility status;Weight bearing status  Activity Tolerance: Patient tolerated treatment well Patient left: in chair;with call bell/phone within reach;with family/visitor present   Time: [id number] OT Time Calculation (min): 31 min Charges:  OT General Charges $OT Visit: 1 Procedure OT Evaluation $OT Eval Moderate Complexity: 1 Procedure   Chrys Racer , MS, OTR/L, CLT Pager: [phone]  12/17/2015, 11:38 AM

## 2015-12-17 NOTE — Evaluation (Signed)
Physical Therapy Evaluation Patient Details Name: Adam [name] MRN: [id number] DOB: 09/03/1951 Today's Date: 12/17/2015   History of Present Illness  Patient is a 65 y/o male admitted with severe R foot pain.  He underwent right femoral to below-knee popliteal bypass on 11/28/2015. He underwent amputation of his right great toe on 12/01/2015.  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  PMH positive for NIDDM, PVD, HTN, sleep apnea.  Clinical Impression  Patient presents with decreased mobility due to deficits listed in PT problem list.  He will benefit from skilled PT in the acute setting to allow return home with family support following CIR level rehab stay.    Follow Up Recommendations CIR    Equipment Recommendations  None recommended by PT    Recommendations for Other Services       Precautions / Restrictions Precautions Precautions: Fall Restrictions Weight Bearing Restrictions: Yes RLE Weight Bearing: Non weight bearing      Mobility  Bed Mobility Overal bed mobility: Needs Assistance Bed Mobility: Supine to Sit     Supine to sit: HOB elevated;Min assist     General bed mobility comments: increased time, heavy UE reliance, cues for technique  Transfers Overall transfer level: Needs assistance Equipment used: Rolling walker (2 wheeled) Transfers: Sit to/from Stand Sit to Stand: Min assist;+2 safety/equipment Stand pivot transfers: Min assist;+2 safety/equipment       General transfer comment: cues for safety, hand placement; cues for technique to hop around from bed to chair with RW, limited by pain  Ambulation/Gait                Stairs            Wheelchair Mobility    Modified Rankin (Stroke Patients Only)       Balance Overall balance assessment: Needs assistance   Sitting balance-Leahy Scale: Good     Standing balance support: Bilateral upper extremity supported Standing balance-Leahy Scale: Poor Standing balance comment: UE reliance due to R  BKA                             Pertinent Vitals/Pain Pain Score: 10-Worst pain ever Pain Location: R leg Pain Descriptors / Indicators: Other (Comment) (phantom pain) Pain Intervention(s): Monitored during session;PCA encouraged    Home Living Family/patient expects to be discharged to:: Private residence Living Arrangements: Spouse/significant other Available Help at Discharge: Family;Available 24 hours/day Type of Home: House Home Access: Stairs to enter Entrance Stairs-Rails: None Entrance Stairs-Number of Steps: 4 from garage Home Layout: Two level;Bed/bath upstairs;1/2 bath on main level Home Equipment: Walker - 2 wheels;Crutches;Shower seat - built in      Prior Function Level of Independence: Independent with assistive device(s)         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 [employer]     Hand Dominance   Dominant Hand: Right    Extremity/Trunk Assessment   Upper Extremity Assessment: Overall WFL for tasks assessed           Lower Extremity Assessment: RLE deficits/detail RLE Deficits / Details: limited by pain, noted bulbous swelling at distal end of BK amputation wrapped with ace bandage; able to extend hip and knee slowly to neutral in standing, lifts leg with assist, noted active quad, glut and adductor sets with exercises       Communication   Communication: No difficulties  Cognition Arousal/Alertness: Awake/alert Behavior During Therapy: WFL for tasks assessed/performed Overall Cognitive Status: Within Functional  Limits for tasks assessed                      General Comments General comments (skin integrity, edema, etc.): wife in the room and very supportive; wants him to go to rehab if able to allow maximal independence and for preprosthetic training.    Exercises General Exercises - Lower Extremity Quad Sets: AROM;Right;5 reps;Supine Gluteal Sets: AROM;Right;5 reps;Supine Hip ABduction/ADduction: AROM;Right;5  reps;Supine      Assessment/Plan    PT Assessment Patient needs continued PT services  PT Diagnosis Difficulty walking;Acute pain   PT Problem List Decreased strength;Decreased knowledge of use of DME;Decreased activity tolerance;Pain;Decreased balance;Decreased knowledge of precautions;Decreased mobility  PT Treatment Interventions DME instruction;Balance training;Gait training;Functional mobility training;Patient/family education;Stair training;Therapeutic activities;Therapeutic exercise   PT Goals (Current goals can be found in the Care Plan section) Acute Rehab PT Goals Patient Stated Goal: to go to rehab PT Goal Formulation: With patient/family Time For Goal Achievement: 12/28/15 Potential to Achieve Goals: Good    Frequency Min 3X/week   Barriers to discharge        Co-evaluation PT/OT/SLP Co-Evaluation/Treatment: Yes Reason for Co-Treatment: Other (comment) (pain 10/10) PT goals addressed during session: Mobility/safety with mobility;Proper use of DME;Strengthening/ROM;Balance         End of Session Equipment Utilized During Treatment: Gait belt Activity Tolerance: Patient limited by pain Patient left: in chair;with call bell/phone within reach;with family/visitor present           Time: [id number] PT Time Calculation (min) (ACUTE ONLY): 31 min   Charges:   PT Evaluation $PT Eval Moderate Complexity: 1 Procedure     PT G CodesReginia Naas January 10, 2016, 11:07 AM  Magda Kiel, [place] 2016-01-10

## 2015-12-17 NOTE — Progress Notes (Addendum)
[facility]  Subjective  - He had a very hard day with pain control.  Pharmacy was able to hel-p by adding Oxy 10-20 q 6 prn for pain.  He has a PCA as well.   Objective 176/77 74 99.5 F (37.5 C) (Oral) 18 98%  Intake/Output Summary (Last 24 hours) at 12/17/15 1454 Last data filed at 12/17/15 0745  Gross per 24 hour  Intake    720 ml  Output   1450 ml  Net   -730 ml    Right BKA dressing is clean and dry  Assessment/Planning: POD # 1 Right BKA   Pain better controlled with PO Oxy q 6, this may decrease his need for the PCA hopefully in the next few days. Plan to change the dressing tomorrow    Laurence Slate [facility] 12/17/2015 2:54 PM --  Laboratory Lab Results:  Recent Labs  12/16/15 0322 12/17/15 0430  WBC 9.1 11.8*  HGB 12.0* 12.6*  HCT 37.4* 38.2*  PLT 356 327   BMET  Recent Labs  12/16/15 0322 12/17/15 0430  NA 138 137  K 3.8 3.9  CL 104 98*  CO2 28 30  GLUCOSE 107* 138*  BUN 18 9  CREATININE 0.72 0.72  CALCIUM 9.1 9.3    COAG Lab Results  Component Value Date   INR 1.07 12/16/2015   INR 1.13 11/26/2015   No results found for: PTT    I have examined the patient, reviewed and agree with above. Patient had a great deal of pain last night and this morning. Quite comfortable this afternoon. Discussed with patient and his wife present. Has good pain control currently. Will continue long-acting oral short-acting IV narcotics. Will check dressing in a.m.  [name], MD 12/17/2015 3:13 PM

## 2015-12-17 NOTE — Progress Notes (Signed)
UR Completed. Adisen Bennion, RN, BSN.  [phone] 

## 2015-12-18 LAB — CBC
HCT: 35.5 % — ABNORMAL LOW (ref 39.0–52.0)
HEMOGLOBIN: 12 g/dL — AB (ref 13.0–17.0)
MCH: 31.2 pg (ref 26.0–34.0)
MCHC: 33.8 g/dL (ref 30.0–36.0)
MCV: 92.2 fL (ref 78.0–100.0)
Platelets: 297 10*3/uL (ref 150–400)
RBC: 3.85 MIL/uL — AB (ref 4.22–5.81)
RDW: 12.3 % (ref 11.5–15.5)
WBC: 10.4 10*3/uL (ref 4.0–10.5)

## 2015-12-18 LAB — BASIC METABOLIC PANEL
ANION GAP: 8 (ref 5–15)
BUN: 11 mg/dL (ref 6–20)
CALCIUM: 8.7 mg/dL — AB (ref 8.9–10.3)
CO2: 30 mmol/L (ref 22–32)
Chloride: 95 mmol/L — ABNORMAL LOW (ref 101–111)
Creatinine, Ser: 0.69 mg/dL (ref 0.61–1.24)
Glucose, Bld: 106 mg/dL — ABNORMAL HIGH (ref 65–99)
Potassium: 3.9 mmol/L (ref 3.5–5.1)
Sodium: 133 mmol/L — ABNORMAL LOW (ref 135–145)

## 2015-12-18 LAB — GLUCOSE, CAPILLARY
GLUCOSE-CAPILLARY: 96 mg/dL (ref 65–99)
Glucose-Capillary: 103 mg/dL — ABNORMAL HIGH (ref 65–99)
Glucose-Capillary: 126 mg/dL — ABNORMAL HIGH (ref 65–99)
Glucose-Capillary: 99 mg/dL (ref 65–99)

## 2015-12-18 MED ORDER — HYDROMORPHONE HCL 1 MG/ML IJ SOLN: 1.0000 mg | INTRAMUSCULAR | Status: DC | PRN

## 2015-12-18 MED ORDER — OXYCODONE HCL ER 40 MG PO T12A
40.0000 mg | EXTENDED_RELEASE_TABLET | Freq: Two times a day (BID) | ORAL | Status: DC
  Administered 2015-12-18: 40 mg via ORAL
  Filled 2015-12-18: qty 1

## 2015-12-18 MED ORDER — OXYCODONE HCL 5 MG PO TABS
25.0000 mg | ORAL_TABLET | ORAL | Status: DC
  Administered 2015-12-18 – 2015-12-19 (×5): 25 mg via ORAL
  Filled 2015-12-18 (×5): qty 5

## 2015-12-18 MED ORDER — HYDROMORPHONE 1 MG/ML IV SOLN
INTRAVENOUS | Status: DC
  Administered 2015-12-18: 2.63 mg via INTRAVENOUS

## 2015-12-18 MED ORDER — HYDROMORPHONE 1 MG/ML IV SOLN
INTRAVENOUS | Status: DC
  Administered 2015-12-18: 3.42 mg via INTRAVENOUS
  Administered 2015-12-18: 1.3 mg via INTRAVENOUS
  Administered 2015-12-19: 1.48 mg via INTRAVENOUS
  Administered 2015-12-19: 4.05 mg via INTRAVENOUS
  Administered 2015-12-19: 2.75 mg via INTRAVENOUS
  Filled 2015-12-18: qty 25

## 2015-12-18 MED ORDER — OXYCODONE HCL 5 MG PO TABS
10.0000 mg | ORAL_TABLET | Freq: Four times a day (QID) | ORAL | Status: DC | PRN
  Administered 2015-12-18: 20 mg via ORAL
  Filled 2015-12-18: qty 4

## 2015-12-18 NOTE — Clinical Social Work Note (Signed)
Clinical Social Work Assessment  Patient Details  Name: Adam [name] MRN: [id number] Date of Birth: 15-Jun-1951  Date of referral:  12/18/15               Reason for consult:  Facility Placement                Permission sought to share information with:  Family Supports, Chartered certified accountant granted to share information::  Yes, Verbal Permission Granted  Name::     Adam [name]  Agency::  [facility]  Relationship::  wife  Contact Information:     Housing/Transportation Living arrangements for the past 2 months:  Single Family Home Source of Information:  Spouse Patient Interpreter Needed:  None Criminal Activity/Legal Involvement Pertinent to Current Situation/Hospitalization:  No - Comment as needed Significant Relationships:  Spouse Lives with:  Spouse Do you feel safe going back to the place where you live?  No (physical impairment) Need for family participation in patient care:  No (Coment)  Care giving concerns:  Pt lives at home with wife- currently needing higher level of assistance due to recent leg amputation- wife unable to provide required level of help   Facilities manager / plan:  CSW spoke with pt and wife concerning PT recommendation for rehab at time of DC.  Pt was very lethargic during interview and was unable to participate very well due to high level of pain meds. CSW discussed CIR vs SNF and explained that CIR would be first choice and SNF would only be considered if CIR unable to admit  Employment status:  Retired Nurse, adult PT Recommendations:  Inpatient [place] / Referral to community resources:  [place]  Patient/Family's Response to care:  Pt wife is very adamant that CIR will be able to take the pt but understands need for alternate plan- wife is agreeable to searching for SNF alternatives since she does not feel comfortable taking him home  Patient/Family's  Understanding of and Emotional Response to Diagnosis, Current Treatment, and Prognosis:  No questions or concerns at this time.  Emotional Assessment Appearance:  Appears stated age Attitude/Demeanor/Rapport:  Lethargic, Sedated Affect (typically observed):    Orientation:  Oriented to Self, Oriented to Place (somewhat confused due to pain meds) Alcohol / Substance use:  Not Applicable Psych involvement (Current and /or in the community):  No (Comment)  Discharge Needs  Concerns to be addressed:  Care Coordination Readmission within the last 30 days:  Yes Current discharge risk:  Physical Impairment Barriers to Discharge:  Continued Medical Work up   Cranford Mon, LCSW 12/18/2015, 12:38 PM

## 2015-12-18 NOTE — Progress Notes (Signed)
Rehab admissions - Evaluated for possible admission.  I met with patient and his wife.  I gave wife rehab booklets and explained inpatient rehab services.  Noted patient with PCA pump today and patient is confused.  I have opened the case with [facility] and have faxed information requesting acute inpatient rehab admission.  I will follow up once I hear back from insurance carrier.  Call me for questions.  #[phone]

## 2015-12-18 NOTE — Progress Notes (Signed)
Pt refuse NIV tonight. Pt is stable at this time.

## 2015-12-18 NOTE — Progress Notes (Signed)
MEDICATION RELATED CONSULT NOTE - FOLLOW UP   Pharmacy Consult for Pain management Indication:   No Known Allergies  Patient Measurements: Height: 5\' 11"  (180.3 cm) Weight: 179 lb 7.3 oz (81.4 kg) IBW/kg (Calculated) : 75.3 Adjusted Body Weight:   Vital Signs: Temp: 98.2 F (36.8 C) (01/31 1532) Temp Source: Oral (01/31 1532) BP: 147/76 mmHg (01/31 1532) Pulse Rate: 105 (01/31 1532) Intake/Output from previous day: 01/30 0701 - 01/31 0700 In: 960 [P.O.:960] Out: 2200 [Urine:2200] Intake/Output from this shift: Total I/O In: 480 [P.O.:480] Out: 500 [Urine:500]  Labs:  Recent Labs  12/16/15 0322 12/17/15 0430 12/18/15 0627  WBC 9.1 11.8* 10.4  HGB 12.0* 12.6* 12.0*  HCT 37.4* 38.2* 35.5*  PLT 356 327 297  CREATININE 0.72 0.72 0.69   Estimated Creatinine Clearance: 98 mL/min (by C-G formula based on Cr of 0.69).   Microbiology: Recent Results (from the past 720 hour(s))  Surgical pcr screen     Status: Abnormal   Collection Time: 11/28/15  7:45 AM  Result Value Ref Range Status   MRSA, PCR NEGATIVE NEGATIVE Final   Staphylococcus aureus POSITIVE (A) NEGATIVE Final    Comment:        The Xpert SA Assay (FDA approved for NASAL specimens in patients over 41 years of age), is one component of a comprehensive surveillance program.  Test performance has been validated by [facility] for patients greater than or equal to 24 year old. It is not intended to diagnose infection nor to guide or monitor treatment.     Assessment: 65 yo who came in 1/28 for extreme foot pain with gangrenous changes to the great right toe a few weeks ago. He got an amputation of the toe 1/14. He now has ongoing critical ischemia. He is being admitted for pain control now and amputation 1/29. Pharmacy consulted to assist with pain control. We are going to use dilaudid PCA. I'm going to add a basal rate to the PCA to get the acute pain in control.   1/31 AM: Pain scores much lower 4,  7, then back to 10 overnight with repositioning. 6 this AM. Dilaudid PCA 12.84mg  last 12 hrs (no decrease in PCA use if this is extrapolated to 24 hrs). Patient Adam [name] go to inpatient rehab on a PCA. If his PCA usage still doesn't decline significantly by tomorrow, we could consider a Fentanyl patch.  1530 PM: RN presents with patient/wife complaints of continued uncontrolled pain. I had an extensive conversation with the wife (patient in/out mentally) about his BASELINE opoid doses at home (usually 15-20mg  of oxycodone equivalent every 4 hours around the clock). She expressed frustration that with all of our new-fangled medications that we couldn't find something to control his pain and still allow him able to participate in recovery (mental status).  I reassured her that we were trying to find that point that would allow him to have "tolerable" pain and participate in therapy without compromising his respirations and CO2 retention. Wife seems well-educated and understanding. If increasing his oxycodone dose slightly above his home baseline does not improve his pain by tomorrow, we may try Fentanyl.  Goal of Therapy:  Improved pain scores  Plan:  - PM: Increase PCA basal rate back to 0.4mg /hr. - Add Dilaudid 1-2mg  q 3 hrs severe or breakthrough pain prn - Increase scheduled oxycodone to 25mg  (5mg  above his home dose) scheduled q 4 hrs. - d/c Oxycontin   Adam [name], PharmD, [place] Clinical Staff Pharmacist Pager [phone]  [name], [employer] 12/18/2015,3:58 PM

## 2015-12-18 NOTE — Progress Notes (Signed)
Pt's PCA syringe was replaced. There were 36mL of dilaudid remaining in the old syringe. The syringe was wasted in the sharps container at the nurse's station. Philippa Sicks, RN witnessed the waste.   Grant Fontana RN, BSN

## 2015-12-18 NOTE — Progress Notes (Signed)
MEDICATION RELATED CONSULT NOTE - FOLLOW UP   Pharmacy Consult for Pain management Indication:   No Known Allergies  Patient Measurements: Height: 5\' 11"  (180.3 cm) Weight: 179 lb 7.3 oz (81.4 kg) IBW/kg (Calculated) : 75.3 Adjusted Body Weight:   Vital Signs: Temp: 98.7 F (37.1 C) (01/31 0617) Temp Source: Oral (01/31 0617) BP: 130/69 mmHg (01/31 0617) Pulse Rate: 82 (01/31 0617) Intake/Output from previous day: 01/30 0701 - 01/31 0700 In: 960 [P.O.:960] Out: 2200 [Urine:2200] Intake/Output from this shift: Total I/O In: 240 [P.O.:240] Out: 200 [Urine:200]  Labs:  Recent Labs  12/16/15 0322 12/17/15 0430 12/18/15 0627  WBC 9.1 11.8* 10.4  HGB 12.0* 12.6* 12.0*  HCT 37.4* 38.2* 35.5*  PLT 356 327 297  CREATININE 0.72 0.72 0.69   Estimated Creatinine Clearance: 98 mL/min (by C-G formula based on Cr of 0.69).   Microbiology: Recent Results (from the past 720 hour(s))  Surgical pcr screen     Status: Abnormal   Collection Time: 11/28/15  7:45 AM  Result Value Ref Range Status   MRSA, PCR NEGATIVE NEGATIVE Final   Staphylococcus aureus POSITIVE (A) NEGATIVE Final    Comment:        The Xpert SA Assay (FDA approved for NASAL specimens in patients over 69 years of age), is one component of a comprehensive surveillance program.  Test performance has been validated by [facility] for patients greater than or equal to 50 year old. It is not intended to diagnose infection nor to guide or monitor treatment.     Assessment: 65 yo who came in 1/28 for extreme foot pain with gangrenous changes to the great right toe a few weeks ago. He got an amputation of the toe 1/14. He now has ongoing critical ischemia. He is being admitted for pain control now and amputation 1/29. Pharmacy consulted to assist with pain control. We are going to use dilaudid PCA. I'm going to add a basal rate to the PCA to get the acute pain in control.   1/31: Pain scores much lower 4, 7,  then back to 10 overnight with repositioning. 6 this AM. Dilaudid PCA 12.84mg  last 12 hrs (no decrease in PCA use if this is extrapolated to 24 hrs). Patient Reubin Milan go to inpatient rehab on a PCA. If his PCA usage still doesn't decline significantly by tomorrow, we could consider a Fentanyl patch.  Goal of Therapy:  Improved pain scores  Plan:  - Today: Convert Oxy IR usage to Oxycontin 40 BID, change Oyx IR to 10-20 q6prn, and decrease the basal rate on the Diilaudid PCA.   Lumir Demetriou S. Alford Highland, PharmD, [facility] Clinical Staff Pharmacist Pager [phone]  Eilene Ghazi Stillinger 12/18/2015,9:48 AM

## 2015-12-18 NOTE — Progress Notes (Signed)
Physical Therapy Treatment Patient Details Name: Adam [name] MRN: [id number] DOB: 10-Apr-1951 Today's Date: 12/18/2015    History of Present Illness Patient is a 65 y/o male admitted with severe R foot pain.  He underwent right femoral to below-knee popliteal bypass on 11/28/2015. He underwent amputation of his right great toe on 12/01/2015.  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  PMH positive for NIDDM, PVD, HTN, sleep apnea.    PT Comments    Progressing steadily.  Emphasis on positioning and stump wrapping education, sit to stand, standing activities, ambulation.  Follow Up Recommendations        Equipment Recommendations  None recommended by PT    Recommendations for Other Services       Precautions / Restrictions Precautions Precautions: Fall Restrictions Weight Bearing Restrictions: Yes    Mobility  Bed Mobility Overal bed mobility: Needs Assistance       Supine to sit: Min assist     General bed mobility comments: cues for bridging to EOB and minimal assist to come up  Transfers Overall transfer level: Needs assistance Equipment used: Rolling walker (2 wheeled) Transfers: Sit to/from Stand Sit to Stand: Min assist;+2 safety/equipment Stand pivot transfers: Min assist;+2 safety/equipment       General transfer comment: Cues for safety, hand placement, technique. Pt with posterior lean in standing and required cues to correct this as well as relax right residual limb in standing. Pt limited by pain.   Ambulation/Gait Ambulation/Gait assistance: Min assist;+2 safety/equipment Ambulation Distance (Feet): 5 Feet (then 7 feet x2) Assistive device: Rolling walker (2 wheeled) Gait Pattern/deviations: Step-to pattern     General Gait Details: First time doing "swing to" and became more confident over 3 trials.  Cues for sequencing and positioning of the leg/   Stairs            Wheelchair Mobility    Modified Rankin (Stroke Patients Only)       Balance  Overall balance assessment: Needs assistance   Sitting balance-Leahy Scale: Fair Sitting balance - Comments: tends to list posteriorly with any challenge     Standing balance-Leahy Scale: Poor Standing balance comment: totally reliant on the RW at this point                    Cognition Arousal/Alertness: Awake/alert Behavior During Therapy: [facility] for tasks assessed/performed Overall Cognitive Status: Within Functional Limits for tasks assessed                      Exercises Amputee Exercises Quad Sets: AAROM;AROM;Both;5 reps;Seated Gluteal Sets: AROM;5 reps;Seated Hip Extension: AROM;Right;5 reps;Seated Hip Flexion/Marching: AAROM;10 reps;Seated Knee Flexion: 5 reps;AAROM;Right    General Comments General comments (skin integrity, edema, etc.): educated on stump wrapping, hand wife watch and participate.  Discussed positioning on the leg,  Initiated basic BKA exercises      Pertinent Vitals/Pain Pain Assessment: Faces Faces Pain Scale: Hurts whole lot Pain Location: stum Pain Descriptors / Indicators: Burning;Operative site guarding Pain Intervention(s): Limited activity within patient's tolerance;Monitored during session;PCA encouraged    Home Living                      Prior Function            PT Goals (current goals can now be found in the care plan section) Acute Rehab PT Goals Patient Stated Goal: to go to rehab PT Goal Formulation: With patient/family Time For Goal Achievement: 12/28/15 Potential to  Achieve Goals: Good Progress towards PT goals: Progressing toward goals    Frequency  Min 3X/week    PT Plan Current plan remains appropriate    Co-evaluation             End of Session   Activity Tolerance: Patient limited by pain Patient left: in chair;with call bell/phone within reach;with family/visitor present     Time: [id number] PT Time Calculation (min) (ACUTE ONLY): 32 min  Charges:  $Gait Training: 8-22  mins $Therapeutic Activity: 8-22 mins                    G Codes:      Aslan Himes, Tessie Fass 12/18/2015, 4:06 PM 12/18/2015  Donnella Sham, PT [phone]  (pager)

## 2015-12-18 NOTE — NC FL2 (Signed)
[place] LEVEL OF CARE SCREENING TOOL     IDENTIFICATION  Patient Name: Adam [name] Birthdate: May 08, 1951 Sex: male Admission Date (Current Location): 12/15/2015  [employer] and [place] Number:  Herbalist and Address:  [facility] [place]. [facility], [place] [address] [place], [place] [address]      Provider Number: [id number]  Attending Physician Name and Address:  [name], MD  Relative Name and Phone Number:       Current Level of Care: Hospital Recommended Level of Care: Other (Comment) (CIR) Prior Approval Number:    Date Approved/Denied:   PASRR Number: [id number]  Discharge Plan: Other (Comment) (CIR)    Current Diagnoses: Patient Active Problem List   Diagnosis Date Noted  . Vascular insufficiency   . Abnormality of gait   . Status post below knee amputation of right lower extremity ([place])   . DM type 2 with diabetic peripheral neuropathy ([place])   . Essential hypertension   . SIRS (systemic inflammatory response syndrome) (HCC)   . Post-operative pain   . Acute blood loss anemia   . PAD (peripheral artery disease) ([place]) 12/15/2015  . Peripheral vascular disease, unspecified ([place]) 12/07/2015  . Hyperlipidemia associated with type 2 diabetes mellitus ([place]) 12/07/2015  . Great toe amputation status ([place]) 12/07/2015  . Atherosclerosis of native arteries of left leg with ulceration of unspecified site ([place]) 11/28/2015  . Atherosclerosis of native arteries of the extremities with ulceration ([place]) 11/26/2015  . Atherosclerosis of native arteries of the extremities with gangrene ([place]) 11/23/2015  . New onset type 2 diabetes mellitus ([place]) 11/22/2015  . Hypertension associated with diabetes ([place]) 12/04/2014    Orientation RESPIRATION BLADDER Height & Weight     Self, Time, Situation, Place   (home CPAP at night) Continent Weight: 179 lb 7.3 oz (81.4 kg) Height:  5\' 11"  (180.3 cm)  BEHAVIORAL SYMPTOMS/MOOD NEUROLOGICAL BOWEL  NUTRITION STATUS      Continent Diet (Regular)  AMBULATORY STATUS COMMUNICATION OF NEEDS Skin   Limited Assist Verbally Normal                       Personal Care Assistance Level of Assistance  Bathing, Feeding, Dressing Bathing Assistance: Limited assistance Feeding assistance: Limited assistance Dressing Assistance: Limited assistance     Functional Limitations Info  Sight, Hearing, Speech Sight Info: Adequate Hearing Info: Adequate Speech Info: Adequate    SPECIAL CARE FACTORS FREQUENCY  PT (By licensed PT), OT (By licensed OT)     PT Frequency: 5x OT Frequency: 3x            Contractures      Additional Factors Info  Code Status, Allergies Code Status Info: full Allergies Info: NKA           Current Medications (12/18/2015):  This is the current hospital active medication list Current Facility-Administered Medications  Medication Dose Route Frequency Provider Last Rate Last Dose  . acetaminophen (TYLENOL) tablet 325-650 mg  325-650 mg Oral Q4H PRN Hulen Shouts Rhyne, PA-C   650 mg at 12/16/15 2124   Or  . acetaminophen (TYLENOL) suppository 325-650 mg  325-650 mg Rectal Q4H PRN Hulen Shouts Rhyne, PA-C      . alum & mag hydroxide-simeth (MAALOX/MYLANTA) 200-200-20 MG/5ML suspension 15-30 mL  15-30 mL Oral Q2H PRN Samantha J Rhyne, PA-C      . aspirin chewable tablet 81 mg  81 mg Oral Daily Gabriel Earing, PA-C  81 mg at 12/18/15 1020  . atorvastatin (LIPITOR) tablet 10 mg  10 mg Oral QODAY Samantha J Rhyne, PA-C   10 mg at 12/18/15 1016  . ceFAZolin (ANCEF) IVPB 1 g/50 mL premix  1 g Intravenous 3 times per day Gabriel Earing, PA-C 100 mL/hr at 12/18/15 0633 1 g at 12/18/15 [id number]  . cyclobenzaprine (FLEXERIL) tablet 10 mg  10 mg Oral TID PRN [name], MD   10 mg at 12/18/15 0433  . dextrose 5 %-0.45 % sodium chloride infusion   Intravenous Continuous [name], MD 20 mL/hr at 12/17/15 1422    . diazepam (VALIUM) injection 5 mg  5 mg Intravenous Q6H  PRN Hulen Shouts Rhyne, PA-C   5 mg at 12/18/15 1018  . diphenhydrAMINE (BENADRYL) injection 12.5 mg  12.5 mg Intravenous Q6H PRN [name], MD       Or  . diphenhydrAMINE (BENADRYL) 12.5 MG/5ML elixir 12.5 mg  12.5 mg Oral Q6H PRN [name], MD      . docusate sodium (COLACE) capsule 100 mg  100 mg Oral Daily Samantha J Rhyne, PA-C   100 mg at 12/18/15 1015  . docusate sodium (COLACE) capsule 100 mg  100 mg Oral BID PRN Samantha J Rhyne, PA-C      . enoxaparin (LOVENOX) injection 30 mg  30 mg Subcutaneous Q24H Samantha J Rhyne, PA-C   30 mg at 12/18/15 1131  . guaiFENesin-dextromethorphan (ROBITUSSIN DM) 100-10 MG/5ML syrup 15 mL  15 mL Oral Q4H PRN Samantha J Rhyne, PA-C      . hydrALAZINE (APRESOLINE) injection 5 mg  5 mg Intravenous Q20 Min PRN Samantha J Rhyne, PA-C      . hydrochlorothiazide (MICROZIDE) capsule 12.5 mg  12.5 mg Oral Daily Arvilla Meres Early, MD   12.5 mg at 12/18/15 1020  . HYDROmorphone (DILAUDID) 1 mg/mL PCA injection   Intravenous 6 times per day Karren Cobble, RPH   2.63 mg at 12/18/15 1127  . insulin aspart (novoLOG) injection 0-15 Units  0-15 Units Subcutaneous TID WC Samantha J Rhyne, PA-C   0 Units at 12/17/15 0700  . labetalol (NORMODYNE,TRANDATE) injection 10 mg  10 mg Intravenous Q10 min PRN Samantha J Rhyne, PA-C      . lisinopril (PRINIVIL,ZESTRIL) tablet 20 mg  20 mg Oral Daily [name], MD   20 mg at 12/18/15 1020  . metoprolol (LOPRESSOR) injection 2-5 mg  2-5 mg Intravenous Q2H PRN Samantha J Rhyne, PA-C      . multivitamin with minerals tablet 1 tablet  1 tablet Oral Daily Hulen Shouts Rhyne, PA-C   1 tablet at 12/18/15 1016  . naloxone (NARCAN) injection 0.4 mg  0.4 mg Intravenous PRN [name], MD       And  . sodium chloride flush (NS) 0.9 % injection 9 mL  9 mL Intravenous PRN [name], MD      . ondansetron [facility]) injection 4 mg  4 mg Intravenous Q6H PRN [name], MD      . oxyCODONE (Oxy IR/ROXICODONE) immediate release tablet  10-20 mg  10-20 mg Oral Q6H PRN Karren Cobble, RPH   20 mg at 12/18/15 1006  . oxyCODONE (OXYCONTIN) 12 hr tablet 40 mg  40 mg Oral Q12H Crystal Trellis Moment, RPH   40 mg at 12/18/15 1006  . pantoprazole (PROTONIX) EC tablet 40 mg  40 mg Oral Daily Samantha J Rhyne, PA-C   40 mg  at 12/18/15 1016  . phenol (CHLORASEPTIC) mouth spray 1 spray  1 spray Mouth/Throat PRN Samantha J Rhyne, PA-C      . potassium chloride SA (K-DUR,KLOR-CON) CR tablet 20-40 mEq  20-40 mEq Oral Daily PRN Gabriel Earing, PA-C         Discharge Medications: Please see discharge summary for a list of discharge medications.  Relevant Imaging Results:  Relevant Lab Results:   Additional Information SS# [id number]  [place]ran[place] Mon, [place]

## 2015-12-18 NOTE — Progress Notes (Signed)
Patient ID: Adam [name], male   DOB: 1951-10-05, 65 y.o.   MRN: [id number] Slept well last night. PCA controlling pain. Left BKA dressing removed. Excellent [name] healing. Discussed with the patient and his wife present. Continue physical therapy. I feel he will be an excellent inpatient rehabilitation candidate.

## 2015-12-18 NOTE — NC FL2 (Signed)
[place] LEVEL OF CARE SCREENING TOOL     IDENTIFICATION  Patient Name: Adam [name] Birthdate: 06/20/1951 Sex: male Admission Date (Current Location): 12/15/2015  [facility] and [place] Number:  Herbalist and Address:  [facility] [place]. [facility], [place] [address] [place], [place] [address]      Provider Number: [id number]  Attending Physician Name and Address:  [name], MD  Relative Name and Phone Number:       Current Level of Care: Hospital Recommended Level of Care: Other (Comment) (CIR) Prior Approval Number:    Date Approved/Denied:   PASRR Number:    Discharge Plan: Other (Comment) (CIR)    Current Diagnoses: Patient Active Problem List   Diagnosis Date Noted  . Vascular insufficiency   . Abnormality of gait   . Status post below knee amputation of right lower extremity ([place])   . DM type 2 with diabetic peripheral neuropathy ([place])   . Essential hypertension   . SIRS (systemic inflammatory response syndrome) (HCC)   . Post-operative pain   . Acute blood loss anemia   . PAD (peripheral artery disease) ([place]) 12/15/2015  . Peripheral vascular disease, unspecified ([place]) 12/07/2015  . Hyperlipidemia associated with type 2 diabetes mellitus ([place]) 12/07/2015  . Great toe amputation status ([place]) 12/07/2015  . Atherosclerosis of native arteries of left leg with ulceration of unspecified site ([place]) 11/28/2015  . Atherosclerosis of native arteries of the extremities with ulceration ([place]) 11/26/2015  . 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 New onset type 2 diabetes mellitus ([place]) 11/22/2015  . Hypertension associated with diabetes ([place]) 12/04/2014    Orientation RESPIRATION BLADDER Height & Weight     Self, Time, Situation, Place  Normal Continent Weight: 179 lb 7.3 oz (81.4 kg) Height:  5\' 11"  (180.3 cm)  BEHAVIORAL SYMPTOMS/MOOD NEUROLOGICAL BOWEL NUTRITION STATUS   Continent Diet (Regular)  AMBULATORY STATUS COMMUNICATION OF NEEDS Skin   Limited Assist Verbally Normal                       Personal Care Assistance Level of Assistance  Bathing, Feeding, Dressing Bathing Assistance: Limited assistance Feeding assistance: Limited assistance Dressing Assistance: Limited assistance     Functional Limitations Info  Sight, Hearing, Speech Sight Info: Adequate Hearing Info: Adequate Speech Info: Adequate    SPECIAL CARE FACTORS FREQUENCY  PT (By licensed PT), OT (By licensed OT)     PT Frequency: 5x OT Frequency: 3x            Contractures      Additional Factors Info  Code Status, Allergies Code Status Info: full Allergies Info: NKA           Current Medications (12/18/2015):  This is the current hospital active medication list Current Facility-Administered Medications  Medication Dose Route Frequency Provider Last Rate Last Dose  . acetaminophen (TYLENOL) tablet 325-650 mg  325-650 mg Oral Q4H PRN Hulen Shouts Rhyne, PA-C   650 mg at 12/16/15 2124   Or  . acetaminophen (TYLENOL) suppository 325-650 mg  325-650 mg Rectal Q4H PRN Hulen Shouts Rhyne, PA-C      . alum & mag hydroxide-simeth (MAALOX/MYLANTA) 200-200-20 MG/5ML suspension 15-30 mL  15-30 mL Oral Q2H PRN Samantha J Rhyne, PA-C      . aspirin chewable tablet 81 mg  81 mg Oral Daily Samantha J Rhyne, PA-C   81 mg at 12/18/15 1020  .  atorvastatin (LIPITOR) tablet 10 mg  10 mg Oral QODAY Samantha J Rhyne, PA-C   10 mg at 12/18/15 1016  . ceFAZolin (ANCEF) IVPB 1 g/50 mL premix  1 g Intravenous 3 times per day Gabriel Earing, PA-C 100 mL/hr at 12/18/15 0633 1 g at 12/18/15 [id number]  . cyclobenzaprine (FLEXERIL) tablet 10 mg  10 mg Oral TID PRN [name], MD   10 mg at 12/18/15 0433  . dextrose 5 %-0.45 % sodium chloride infusion   Intravenous Continuous [name], MD 20 mL/hr at 12/17/15 1422    . diazepam (VALIUM) injection 5 mg  5 mg Intravenous Q6H PRN Hulen Shouts Rhyne,  PA-C   5 mg at 12/18/15 1018  . diphenhydrAMINE (BENADRYL) injection 12.5 mg  12.5 mg Intravenous Q6H PRN [name], MD       Or  . diphenhydrAMINE (BENADRYL) 12.5 MG/5ML elixir 12.5 mg  12.5 mg Oral Q6H PRN [name], MD      . docusate sodium (COLACE) capsule 100 mg  100 mg Oral Daily Samantha J Rhyne, PA-C   100 mg at 12/18/15 1015  . docusate sodium (COLACE) capsule 100 mg  100 mg Oral BID PRN Samantha J Rhyne, PA-C      . enoxaparin (LOVENOX) injection 30 mg  30 mg Subcutaneous Q24H Samantha J Rhyne, PA-C   30 mg at 12/18/15 1131  . guaiFENesin-dextromethorphan (ROBITUSSIN DM) 100-10 MG/5ML syrup 15 mL  15 mL Oral Q4H PRN Samantha J Rhyne, PA-C      . hydrALAZINE (APRESOLINE) injection 5 mg  5 mg Intravenous Q20 Min PRN Samantha J Rhyne, PA-C      . hydrochlorothiazide (MICROZIDE) capsule 12.5 mg  12.5 mg Oral Daily Arvilla Meres Early, MD   12.5 mg at 12/18/15 1020  . HYDROmorphone (DILAUDID) 1 mg/mL PCA injection   Intravenous 6 times per day Karren Cobble, RPH   2.63 mg at 12/18/15 1127  . insulin aspart (novoLOG) injection 0-15 Units  0-15 Units Subcutaneous TID WC Samantha J Rhyne, PA-C   0 Units at 12/17/15 0700  . labetalol (NORMODYNE,TRANDATE) injection 10 mg  10 mg Intravenous Q10 min PRN Samantha J Rhyne, PA-C      . lisinopril (PRINIVIL,ZESTRIL) tablet 20 mg  20 mg Oral Daily [name], MD   20 mg at 12/18/15 1020  . metoprolol (LOPRESSOR) injection 2-5 mg  2-5 mg Intravenous Q2H PRN Samantha J Rhyne, PA-C      . multivitamin with minerals tablet 1 tablet  1 tablet Oral Daily Hulen Shouts Rhyne, PA-C   1 tablet at 12/18/15 1016  . naloxone (NARCAN) injection 0.4 mg  0.4 mg Intravenous PRN [name], MD       And  . sodium chloride flush (NS) 0.9 % injection 9 mL  9 mL Intravenous PRN [name], MD      . ondansetron [facility]) injection 4 mg  4 mg Intravenous Q6H PRN [name], MD      . oxyCODONE (Oxy IR/ROXICODONE) immediate release tablet 10-20 mg  10-20 mg Oral  Q6H PRN Karren Cobble, RPH   20 mg at 12/18/15 1006  . oxyCODONE (OXYCONTIN) 12 hr tablet 40 mg  40 mg Oral Q12H Crystal Trellis Moment, RPH   40 mg at 12/18/15 1006  . pantoprazole (PROTONIX) EC tablet 40 mg  40 mg Oral Daily Samantha J Rhyne, PA-C   40 mg at 12/18/15 1016  . phenol (CHLORASEPTIC)  mouth spray 1 spray  1 spray Mouth/Throat PRN Samantha J Rhyne, PA-C      . potassium chloride SA (K-DUR,KLOR-CON) CR tablet 20-40 mEq  20-40 mEq Oral Daily PRN Gabriel Earing, PA-C         Discharge Medications: Please see discharge summary for a list of discharge medications.  Relevant Imaging Results:  Relevant Lab Results:   Additional Information SS# [id number]  Minta Balsam  BSW intern  [phone]

## 2015-12-19 LAB — GLUCOSE, CAPILLARY
GLUCOSE-CAPILLARY: 106 mg/dL — AB (ref 65–99)
GLUCOSE-CAPILLARY: 98 mg/dL (ref 65–99)
Glucose-Capillary: 110 mg/dL — ABNORMAL HIGH (ref 65–99)
Glucose-Capillary: 108 mg/dL — ABNORMAL HIGH (ref 65–99)

## 2015-12-19 MED ORDER — CELECOXIB 200 MG PO CAPS
200.0000 mg | ORAL_CAPSULE | Freq: Two times a day (BID) | ORAL | Status: DC
  Administered 2015-12-19 – 2015-12-20 (×3): 200 mg via ORAL
  Filled 2015-12-19 (×4): qty 1

## 2015-12-19 MED ORDER — FENTANYL CITRATE (PF) 100 MCG/2ML IJ SOLN
50.0000 ug | INTRAMUSCULAR | Status: AC
  Administered 2015-12-19 (×3): 50 ug via INTRAVENOUS
  Filled 2015-12-19 (×2): qty 2

## 2015-12-19 MED ORDER — FENTANYL CITRATE (PF) 100 MCG/2ML IJ SOLN
25.0000 ug | INTRAMUSCULAR | Status: DC | PRN
  Administered 2015-12-19 – 2015-12-20 (×4): 50 ug via INTRAVENOUS
  Filled 2015-12-19 (×4): qty 2

## 2015-12-19 MED ORDER — OXYCODONE HCL 5 MG PO TABS
30.0000 mg | ORAL_TABLET | ORAL | Status: DC
  Administered 2015-12-19 – 2015-12-20 (×8): 30 mg via ORAL
  Filled 2015-12-19 (×7): qty 6

## 2015-12-19 MED ORDER — DOCUSATE SODIUM 100 MG PO CAPS
100.0000 mg | ORAL_CAPSULE | Freq: Two times a day (BID) | ORAL | Status: DC
  Administered 2015-12-19 – 2015-12-20 (×2): 100 mg via ORAL
  Filled 2015-12-19 (×2): qty 1

## 2015-12-19 MED ORDER — GABAPENTIN 300 MG PO CAPS: 300.0000 mg | ORAL_CAPSULE | Freq: Three times a day (TID) | ORAL | Status: DC

## 2015-12-19 MED ORDER — OXYCODONE HCL 5 MG PO TABS
30.0000 mg | ORAL_TABLET | ORAL | Status: DC
  Filled 2015-12-19: qty 6

## 2015-12-19 MED ORDER — FENTANYL 50 MCG/HR TD PT72
50.0000 ug | MEDICATED_PATCH | TRANSDERMAL | Status: DC
  Administered 2015-12-19: 50 ug via TRANSDERMAL
  Filled 2015-12-19: qty 1

## 2015-12-19 MED ORDER — FENTANYL CITRATE (PF) 100 MCG/2ML IJ SOLN
50.0000 ug | INTRAMUSCULAR | Status: DC
  Filled 2015-12-19: qty 2

## 2015-12-19 MED ORDER — GABAPENTIN 300 MG PO CAPS
300.0000 mg | ORAL_CAPSULE | Freq: Two times a day (BID) | ORAL | Status: DC
  Administered 2015-12-19 – 2015-12-20 (×3): 300 mg via ORAL
  Filled 2015-12-19 (×3): qty 1

## 2015-12-19 MED ORDER — FENTANYL CITRATE (PF) 100 MCG/2ML IJ SOLN
50.0000 ug | Freq: Once | INTRAMUSCULAR | Status: AC
  Administered 2015-12-19: 50 ug via INTRAVENOUS
  Filled 2015-12-19: qty 2

## 2015-12-19 NOTE — Progress Notes (Signed)
PCA dilaudid discontinued at 1130H. 15mg  wasted as witnessed by [employer]

## 2015-12-19 NOTE — Progress Notes (Signed)
Rehab admissions - I have approval from insurance carrier for acute inpatient rehab admission.  Noted patient with PCA.  Once PCA removed, then likely ready for inpatient rehab admission.  I spoke with wife.  She does not feel patient is ready for rehab today.  Call me for questions.  [id number]

## 2015-12-19 NOTE — Progress Notes (Addendum)
[facility] [place]  Subjective  - Pain is doing well he is still using PCA 2-3 times.   Objective 135/64 87 98.7 F (37.1 C) (Oral) 15 97%  Intake/Output Summary (Last 24 hours) at 12/19/15 0827 Last data filed at 12/19/15 0715  Gross per 24 hour  Intake    480 ml  Output   1575 ml  Net  -1095 ml    Right BKA stump with medial incisional purplish skin discoloration.  The stump is warm without drainage.  Assessment/Planning: POD # 3 Right BKA Pending CIR    Laurence Slate [facility] 12/19/2015 8:27 AM --  Laboratory Lab Results:  Recent Labs  12/17/15 0430 12/18/15 0627  WBC 11.8* 10.4  HGB 12.6* 12.0*  HCT 38.2* 35.5*  PLT 327 297   BMET  Recent Labs  12/17/15 0430 12/18/15 0627  NA 137 133*  K 3.9 3.9  CL 98* 95*  CO2 30 30  GLUCOSE 138* 106*  BUN 9 11  CREATININE 0.72 0.69  CALCIUM 9.3 8.7*    COAG Lab Results  Component Value Date   INR 1.07 12/16/2015   INR 1.13 11/26/2015   No results found for: PTT    I have examined the patient, reviewed and agree with above. Better pain control. Plan to discharge to skilled inpatient rehabilitation. Continue to watch amputation site.  [name], MD 12/19/2015 2:25 PM

## 2015-12-19 NOTE — Progress Notes (Signed)
Physical Therapy Treatment Patient Details Name: Adam [name] MRN: [id number] DOB: [birth year] Today's Date: 12/19/2015    History of Present Illness Patient is a 65 y/o male admitted with severe R foot pain.  He underwent right femoral to below-knee popliteal bypass on 11/28/2015. He underwent amputation of his right great toe on 12/01/2015.  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  PMH positive for NIDDM, PVD, HTN, sleep apnea.    PT Comments    Patient progressing slowly towards PT goals. Reviewed exercises and emphasized hip/knee extension on right residual limb.  Pt continues to have pain/guarding  limiting mobility.  Tolerated SPT x2 using Min A for safety/balance. Highly motivated to return to PLOF. Good rehab candidate. Will continue to follow per current POC.   Follow Up Recommendations  CIR     Equipment Recommendations  None recommended by PT    Recommendations for Other Services       Precautions / Restrictions Precautions Precautions: Fall Restrictions Weight Bearing Restrictions: Yes RLE Weight Bearing: Non weight bearing (BKA)    Mobility  Bed Mobility Overal bed mobility: Needs Assistance Bed Mobility: Supine to Sit     Supine to sit: Min guard;HOB elevated     General bed mobility comments: Heavy use of rail for support and cues to use LLE to bridge to scoot bottom to EOB.   Transfers Overall transfer level: Needs assistance Equipment used: Rolling walker (2 wheeled) Transfers: Sit to/from [facility] Sit to Stand: Min assist;Mod assist Stand pivot transfers: Min assist       General transfer comment: Cues for safety, hand placement, technique. Pt with posterior lean in standing and required cues to correct this as well as relax right residual limb in standing. Cues for upright as pt with flexed posture. Stood from [employer], from [facility] x1, from chair x1. SPT chair to/from [facility].  Ambulation/Gait Ambulation/Gait assistance: Min assist Ambulation  Distance (Feet): 5 Feet (+4') Assistive device: Rolling walker (2 wheeled) Gait Pattern/deviations: Trunk flexed ("hop to")   Gait velocity interpretation: Below normal speed for age/gender General Gait Details: Cues for RW proximity, positioning. Difficulty extending RLE due to pain despite cues. Tremoring right quad.   Stairs            Wheelchair Mobility    Modified Rankin (Stroke Patients Only)       Balance Overall balance assessment: Needs assistance Sitting-balance support: Feet supported;Bilateral upper extremity supported Sitting balance-Leahy Scale: Fair Sitting balance - Comments: Posterior lean with dynamic activities - performing bath sitting EOB.  Postural control: Posterior lean Standing balance support: During functional activity Standing balance-Leahy Scale: Poor Standing balance comment: Reliant on RW for support. Total A for peri care.                     Cognition Arousal/Alertness: Awake/alert Behavior During Therapy: WFL for tasks assessed/performed Overall Cognitive Status: Within Functional Limits for tasks assessed                      Exercises Amputee Exercises Quad Sets: Both;10 reps;Seated Hip Extension: Right;5 reps;Standing Knee Flexion: Right;Seated;10 reps Knee Extension: Right;Seated;10 reps    General Comments General comments (skin integrity, edema, etc.): Discussed importance of sitting in chair, positioning, exercises.       Pertinent Vitals/Pain Pain Assessment: Faces Faces Pain Scale: Hurts whole lot Pain Location: Rt residual limb Pain Descriptors / Indicators: Grimacing;Guarding;Operative site guarding Pain Intervention(s): Monitored during session;Repositioned;PCA encouraged;Limited activity within patient's tolerance  Home Living                      Prior Function            PT Goals (current goals can now be found in the care plan section) Progress towards PT goals: Progressing  toward goals    Frequency  Min 3X/week    PT Plan Current plan remains appropriate    Co-evaluation             End of Session Equipment Utilized During Treatment: Gait belt Activity Tolerance: Patient limited by pain Patient left: in chair;with call bell/phone within reach;with family/visitor present     Time: 1027 (1100-11:15 after using BSC)-1054 PT Time Calculation (min) (ACUTE ONLY): 27 min  Charges:  $Therapeutic Activity: 23-37 mins                    G Codes:      Kristy Catoe A Gaetana Kawahara 12/19/2015, 11:33 AM Wray Kearns, PT, DPT [phone]

## 2015-12-19 NOTE — Progress Notes (Signed)
MEDICATION RELATED CONSULT NOTE - FOLLOW UP   Pharmacy Consult for Pain management Indication:   No Known Allergies  Patient Measurements: Height: 5\' 11"  (180.3 cm) Weight: 179 lb 7.3 oz (81.4 kg) IBW/kg (Calculated) : 75.3 Adjusted Body Weight:   Vital Signs: Temp: 99.6 F (37.6 C) (02/01 1017) Temp Source: Oral (02/01 1017) BP: 107/62 mmHg (02/01 1017) Pulse Rate: 79 (02/01 1017) Intake/Output from previous day: 01/31 0701 - 02/01 0700 In: 720 [P.O.:720] Out: 1350 [Urine:1350] Intake/Output from this shift: Total I/O In: -  Out: 425 [Urine:425]  Labs:  Recent Labs  12/17/15 0430 12/18/15 0627  WBC 11.8* 10.4  HGB 12.6* 12.0*  HCT 38.2* 35.5*  PLT 327 297  CREATININE 0.72 0.69   Estimated Creatinine Clearance: 98 mL/min (by C-G formula based on Cr of 0.69).   Microbiology: Recent Results (from the past 720 hour(s))  Surgical pcr screen     Status: Abnormal   Collection Time: 11/28/15  7:45 AM  Result Value Ref Range Status   MRSA, PCR NEGATIVE NEGATIVE Final   Staphylococcus aureus POSITIVE (A) NEGATIVE Final    Comment:        The Xpert SA Assay (FDA approved for NASAL specimens in patients over 67 years of age), is one component of a comprehensive surveillance program.  Test performance has been validated by [facility] for patients greater than or equal to 70 year old. It is not intended to diagnose infection nor to guide or monitor treatment.     Assessment: 65 yo who came in 1/28 for extreme foot pain with gangrenous changes to the great right toe a few weeks ago. He got an amputation of the toe 1/14. He now has ongoing critical ischemia. He is being admitted for pain control now and amputation 1/29. Pharmacy consulted to assist with pain control. We are going to use dilaudid PCA. I'm going to add a basal rate to the PCA to get the acute pain in control.    12/19/15: Pain scores since yesterday afternoon: 10, 10, 9, 8, 8, 10, 9. Patient has  been cleared for rehab but cannot go on a PCA. Temp 99.6. WBC 10.4 down slightly. HR 79-105, RR 12-23. None of the PRN Dilaudid IV has been charted for severe pain. We need to try a MULTI-MODAL APPROACH.  --Add Celebrex 200mg  BID for inflammation, Gabapentin 300mg  BID (can titrate up every 3 days), Increase Oxy IR to 30mg  q 4 hrs, d/c Dilaudid PCA. Start with Fentanyl 85mcg q 1h x 3, then will reassess for longer intervals. --Upon speaking with pt and wife, BOTH say he is doing "better." He was currently up walking with his walker with PT, about to have a BM. No indication of drowsiness or AMS from opioids and VS do not support his "10" pain scores.  Goal of Therapy:  Improved pain scores  Plan:  1. Add Celebrex 200mg  BID for inflammation of tissue 2. Gabapentin 300mg  BID (can titrate up every 3 days) for nerve pain 3. Increase Oxy IR to 30mg  q 4 hrs, 4.  d/c Dilaudid PCA 5. Start with Fentanyl 53mcg q 1h x 3, then will reassess for longer intervals this afternoon.   Starsky Nanna S. Alford Highland, PharmD, [place] Clinical Staff Pharmacist Pager [phone]  [place], [place] 12/19/2015,11:07 AM

## 2015-12-19 NOTE — Progress Notes (Signed)
Pt. placed on CPAP with own Nasal pillows now that is off [id number], on room air, tolerating well, RN aware.

## 2015-12-19 NOTE — Progress Notes (Signed)
Pt refuse CPAP for the night.  

## 2015-12-19 NOTE — Progress Notes (Signed)
MEDICATION RELATED CONSULT NOTE - FOLLOW UP   Pharmacy Consult for Pain management Indication:   No Known Allergies  Patient Measurements: Height: 5\' 11"  (180.3 cm) Weight: 179 lb 7.3 oz (81.4 kg) IBW/kg (Calculated) : 75.3 Adjusted Body Weight:   Vital Signs: Temp: 98 F (36.7 C) (02/01 1246) Temp Source: Oral (02/01 1246) BP: 123/64 mmHg (02/01 1438) Pulse Rate: 90 (02/01 1438) Intake/Output from previous day: 01/31 0701 - 02/01 0700 In: 720 [P.O.:720] Out: 1350 [Urine:1350] Intake/Output from this shift: Total I/O In: 240 [P.O.:240] Out: 425 [Urine:425]  Labs:  Recent Labs  12/17/15 0430 12/18/15 0627  WBC 11.8* 10.4  HGB 12.6* 12.0*  HCT 38.2* 35.5*  PLT 327 297  CREATININE 0.72 0.69   Estimated Creatinine Clearance: 98 mL/min (by C-G formula based on Cr of 0.69).   Microbiology: Recent Results (from the past 720 hour(s))  Surgical pcr screen     Status: Abnormal   Collection Time: 11/28/15  7:45 AM  Result Value Ref Range Status   MRSA, PCR NEGATIVE NEGATIVE Final   Staphylococcus aureus POSITIVE (A) NEGATIVE Final    Comment:        The Xpert SA Assay (FDA approved for NASAL specimens in patients over 31 years of age), is one component of a comprehensive surveillance program.  Test performance has been validated by [facility] for patients greater than or equal to 29 year old. It is not intended to diagnose infection nor to guide or monitor treatment.     Assessment: 65 yo who came in 1/28 for extreme foot pain with gangrenous changes to the great right toe a few weeks ago. He got an amputation of the toe 1/14. He now has ongoing critical ischemia. He is being admitted for pain control now and amputation 1/29. Pharmacy consulted to assist with pain control. We are going to use dilaudid PCA. I'm going to add a basal rate to the PCA to get the acute pain in control.    12/19/15: Pain scores since yesterday afternoon: 10, 10, 9, 8, 8, 10, 9.  Patient has been cleared for rehab but cannot go on a PCA. Temp 99.6. WBC 10.4 down slightly. HR 79-105, RR 12-23. None of the PRN Dilaudid IV has been charted for severe pain. We need to try a MULTI-MODAL APPROACH.   --AM: Upon speaking with pt and wife, BOTH say he is doing "better." He was currently up walking with his walker with PT, about to have a BM. No indication of drowsiness or AMS from opioids and VS do not support his "10" pain scores.   1445 PM: Did well with Fentanyl IV over the last 3 hours (though variably drowsy). They seem satisfied over the attention to his level of pain and changes to his regimen. Spoke with Dr. Donnetta Hutching who was ok with the plan to transition to Fentanyl patch.   I explained to the wife that his level of oxycodone will need to slowly decreased very soon as his pain hopefully starts to improve due to dependence (took a lot at home). Hopefully this can be facilitated as when he goes to rehab today/tomorrow.  Goal of Therapy:  Improved pain scores  Plan:  1. D/c Dilaudid PCA 2. Add Celebrex 200mg  BID for inflammation of tissue 3. Gabapentin 300mg  BID (can titrate up every 3 days) for nerve pain 4. Increase Oxy IR to 30mg  q 4 hrs, plan to taper ASAP 5. Fentanyl 64mcg IV x 1 more dose  And transition to.....,.  6. Fentanyl to 54mcg/hr patch q 72h.   Penn Grissett S. Alford Highland, PharmD, BCPS Clinical Staff Pharmacist Pager [phone]  Eilene Ghazi Stillinger 12/19/2015,2:43 PM

## 2015-12-20 ENCOUNTER — Encounter (HOSPITAL_COMMUNITY): Payer: Self-pay | Admitting: *Deleted

## 2015-12-20 ENCOUNTER — Inpatient Hospital Stay (HOSPITAL_COMMUNITY)
Admission: RE | Admit: 2015-12-20 | Discharge: 2015-12-26 | DRG: 560 | Disposition: A | Discharge status: Other Acute Inpatient Hospital | Payer: Medicare Other | Source: Intra-hospital | Attending: Physical Medicine & Rehabilitation | Admitting: Physical Medicine & Rehabilitation

## 2015-12-20 ENCOUNTER — Telehealth: Payer: Self-pay | Admitting: Vascular Surgery

## 2015-12-20 DIAGNOSIS — R740 Nonspecific elevation of levels of transaminase and lactic acid dehydrogenase [LDH]: Secondary | ICD-10-CM

## 2015-12-20 DIAGNOSIS — Z4781 Encounter for orthopedic aftercare following surgical amputation: Principal | ICD-10-CM

## 2015-12-20 DIAGNOSIS — Z87891 Personal history of nicotine dependence: Secondary | ICD-10-CM

## 2015-12-20 DIAGNOSIS — E785 Hyperlipidemia, unspecified: Secondary | ICD-10-CM | POA: Diagnosis present

## 2015-12-20 DIAGNOSIS — Z01818 Encounter for other preprocedural examination: Secondary | ICD-10-CM

## 2015-12-20 DIAGNOSIS — Z79899 Other long term (current) drug therapy: Secondary | ICD-10-CM

## 2015-12-20 DIAGNOSIS — D62 Acute posthemorrhagic anemia: Secondary | ICD-10-CM | POA: Diagnosis present

## 2015-12-20 DIAGNOSIS — D72829 Elevated white blood cell count, unspecified: Secondary | ICD-10-CM | POA: Diagnosis not present

## 2015-12-20 DIAGNOSIS — G894 Chronic pain syndrome: Secondary | ICD-10-CM | POA: Diagnosis not present

## 2015-12-20 DIAGNOSIS — Z89511 Acquired absence of right leg below knee: Secondary | ICD-10-CM

## 2015-12-20 DIAGNOSIS — R58 Hemorrhage, not elsewhere classified: Secondary | ICD-10-CM | POA: Diagnosis not present

## 2015-12-20 DIAGNOSIS — K5900 Constipation, unspecified: Secondary | ICD-10-CM | POA: Diagnosis present

## 2015-12-20 DIAGNOSIS — K5903 Drug induced constipation: Secondary | ICD-10-CM | POA: Insufficient documentation

## 2015-12-20 DIAGNOSIS — E1142 Type 2 diabetes mellitus with diabetic polyneuropathy: Secondary | ICD-10-CM | POA: Diagnosis present

## 2015-12-20 DIAGNOSIS — E871 Hypo-osmolality and hyponatremia: Secondary | ICD-10-CM | POA: Diagnosis present

## 2015-12-20 DIAGNOSIS — G4730 Sleep apnea, unspecified: Secondary | ICD-10-CM | POA: Diagnosis present

## 2015-12-20 DIAGNOSIS — R21 Rash and other nonspecific skin eruption: Secondary | ICD-10-CM | POA: Diagnosis not present

## 2015-12-20 DIAGNOSIS — Z7982 Long term (current) use of aspirin: Secondary | ICD-10-CM | POA: Diagnosis not present

## 2015-12-20 DIAGNOSIS — R269 Unspecified abnormalities of gait and mobility: Secondary | ICD-10-CM

## 2015-12-20 DIAGNOSIS — L8990 Pressure ulcer of unspecified site, unspecified stage: Secondary | ICD-10-CM | POA: Insufficient documentation

## 2015-12-20 DIAGNOSIS — I10 Essential (primary) hypertension: Secondary | ICD-10-CM | POA: Diagnosis present

## 2015-12-20 DIAGNOSIS — I739 Peripheral vascular disease, unspecified: Secondary | ICD-10-CM

## 2015-12-20 DIAGNOSIS — R7401 Elevation of levels of liver transaminase levels: Secondary | ICD-10-CM | POA: Insufficient documentation

## 2015-12-20 LAB — CBC
HEMATOCRIT: 35.4 % — AB (ref 39.0–52.0)
HEMOGLOBIN: 12.1 g/dL — AB (ref 13.0–17.0)
MCH: 31.1 pg (ref 26.0–34.0)
MCHC: 34.2 g/dL (ref 30.0–36.0)
MCV: 91 fL (ref 78.0–100.0)
PLATELETS: 316 10*3/uL (ref 150–400)
RBC: 3.89 MIL/uL — AB (ref 4.22–5.81)
RDW: 12.2 % (ref 11.5–15.5)
WBC: 12.5 10*3/uL — AB (ref 4.0–10.5)

## 2015-12-20 LAB — GLUCOSE, CAPILLARY
GLUCOSE-CAPILLARY: 93 mg/dL (ref 65–99)
Glucose-Capillary: 112 mg/dL — ABNORMAL HIGH (ref 65–99)

## 2015-12-20 LAB — CREATININE, SERUM
Creatinine, Ser: 0.88 mg/dL (ref 0.61–1.24)
GFR calc non Af Amer: >60 mL/min (ref >60)

## 2015-12-20 MED ORDER — DOCUSATE SODIUM 100 MG PO CAPS
100.0000 mg | ORAL_CAPSULE | Freq: Two times a day (BID) | ORAL | Status: DC
  Administered 2015-12-20 – 2015-12-25 (×10): 100 mg via ORAL
  Filled 2015-12-20 (×11): qty 1

## 2015-12-20 MED ORDER — ONDANSETRON HCL 4 MG/2ML IJ SOLN: 4.0000 mg | Freq: Four times a day (QID) | INTRAMUSCULAR | Status: DC | PRN

## 2015-12-20 MED ORDER — ENOXAPARIN SODIUM 30 MG/0.3ML ~~LOC~~ SOLN
30.0000 mg | SUBCUTANEOUS | Status: DC
  Administered 2015-12-21 – 2015-12-23 (×3): 30 mg via SUBCUTANEOUS
  Filled 2015-12-20 (×3): qty 0.3

## 2015-12-20 MED ORDER — INSULIN ASPART 100 UNIT/ML ~~LOC~~ SOLN
0.0000 [IU] | Freq: Three times a day (TID) | SUBCUTANEOUS | Status: DC
  Administered 2015-12-25: 2 [IU] via SUBCUTANEOUS

## 2015-12-20 MED ORDER — ACETAMINOPHEN 325 MG PO TABS: 325.0000 mg | ORAL_TABLET | ORAL | Status: DC | PRN

## 2015-12-20 MED ORDER — ONDANSETRON HCL 4 MG PO TABS: 4.0000 mg | ORAL_TABLET | Freq: Four times a day (QID) | ORAL | Status: DC | PRN

## 2015-12-20 MED ORDER — OXYCODONE HCL 5 MG PO TABS
30.0000 mg | ORAL_TABLET | ORAL | Status: DC
  Administered 2015-12-20 – 2015-12-26 (×35): 30 mg via ORAL
  Filled 2015-12-20 (×35): qty 6

## 2015-12-20 MED ORDER — ATORVASTATIN CALCIUM 10 MG PO TABS
10.0000 mg | ORAL_TABLET | ORAL | Status: DC
  Administered 2015-12-22 – 2015-12-24 (×2): 10 mg via ORAL
  Filled 2015-12-20 (×2): qty 1

## 2015-12-20 MED ORDER — PANTOPRAZOLE SODIUM 40 MG PO TBEC
40.0000 mg | DELAYED_RELEASE_TABLET | Freq: Every day | ORAL | Status: DC
  Administered 2015-12-21 – 2015-12-26 (×6): 40 mg via ORAL
  Filled 2015-12-20 (×6): qty 1

## 2015-12-20 MED ORDER — OXYCODONE HCL 5 MG PO TABS
5.0000 mg | ORAL_TABLET | ORAL | Status: DC | PRN
  Administered 2015-12-24 – 2015-12-26 (×4): 10 mg via ORAL
  Filled 2015-12-20 (×4): qty 2

## 2015-12-20 MED ORDER — ASPIRIN 81 MG PO CHEW
81.0000 mg | CHEWABLE_TABLET | Freq: Every day | ORAL | Status: DC
  Administered 2015-12-21 – 2015-12-25 (×5): 81 mg via ORAL
  Filled 2015-12-20 (×5): qty 1

## 2015-12-20 MED ORDER — DIAZEPAM 2 MG PO TABS: 2.0000 mg | ORAL_TABLET | Freq: Four times a day (QID) | ORAL | Status: DC | PRN

## 2015-12-20 MED ORDER — GABAPENTIN 300 MG PO CAPS
300.0000 mg | ORAL_CAPSULE | Freq: Two times a day (BID) | ORAL | Status: DC
  Administered 2015-12-20 – 2015-12-26 (×12): 300 mg via ORAL
  Filled 2015-12-20 (×12): qty 1

## 2015-12-20 MED ORDER — METHOCARBAMOL 500 MG PO TABS
500.0000 mg | ORAL_TABLET | Freq: Four times a day (QID) | ORAL | Status: DC | PRN
  Administered 2015-12-20 – 2015-12-26 (×5): 500 mg via ORAL
  Filled 2015-12-20 (×5): qty 1

## 2015-12-20 MED ORDER — FENTANYL 25 MCG/HR TD PT72
50.0000 ug | MEDICATED_PATCH | TRANSDERMAL | Status: DC
  Administered 2015-12-22 – 2015-12-25 (×2): 50 ug via TRANSDERMAL
  Filled 2015-12-20 (×2): qty 2

## 2015-12-20 MED ORDER — HYDROCHLOROTHIAZIDE 12.5 MG PO CAPS
12.5000 mg | ORAL_CAPSULE | Freq: Every day | ORAL | Status: DC
  Administered 2015-12-21 – 2015-12-26 (×6): 12.5 mg via ORAL
  Filled 2015-12-20 (×6): qty 1

## 2015-12-20 MED ORDER — SORBITOL 70 % SOLN: 30.0000 mL | Freq: Every day | Status: DC | PRN

## 2015-12-20 MED ORDER — ENOXAPARIN SODIUM 30 MG/0.3ML ~~LOC~~ SOLN: 30.0000 mg | SUBCUTANEOUS | Status: DC

## 2015-12-20 MED ORDER — CELECOXIB 200 MG PO CAPS
200.0000 mg | ORAL_CAPSULE | Freq: Two times a day (BID) | ORAL | Status: DC
  Administered 2015-12-20 – 2015-12-26 (×12): 200 mg via ORAL
  Filled 2015-12-20 (×14): qty 1

## 2015-12-20 MED ORDER — ADULT MULTIVITAMIN W/MINERALS CH
1.0000 | ORAL_TABLET | Freq: Every day | ORAL | Status: DC
  Administered 2015-12-21 – 2015-12-26 (×6): 1 via ORAL
  Filled 2015-12-20 (×7): qty 1

## 2015-12-20 MED ORDER — LISINOPRIL 20 MG PO TABS
20.0000 mg | ORAL_TABLET | Freq: Every day | ORAL | Status: DC
  Administered 2015-12-21 – 2015-12-26 (×6): 20 mg via ORAL
  Filled 2015-12-20 (×6): qty 1

## 2015-12-20 MED ORDER — ACETAMINOPHEN 650 MG RE SUPP: 325.0000 mg | RECTAL | Status: DC | PRN

## 2015-12-20 NOTE — Care Management Note (Signed)
Case Management Note  Patient Details  Name: Adam [name] MRN: [id number] Date of Birth: 09/12/1951  Subjective/Objective:   Pt admitted with PAD, pt is s/p leg amputation                 Action/Plan:  Pt is independent from home with wife.  Pt recently discharged home from [facility] with [facility].  Pt is being considered for CIR with SNF as back up plan once pain is better controlled.  CM will continue to monitor for disposition needs   Expected Discharge Date:                  Expected Discharge Plan:     In-House Referral:     Discharge planning Services  CM Consult  Post Acute Care Choice:    Choice offered to:     DME Arranged:    DME Agency:     HH Arranged:    [place] Agency:     Status of Service:  Complete, will sign off  Medicare Important Message Given:    Date Medicare IM Given:    Medicare IM give by:    Date Additional Medicare IM Given:    Additional Medicare Important Message give by:     If discussed at [place] of Stay Meetings, dates discussed:    Additional Comments: Pt will discharge today to [facility], RN 12/20/2015, 1:39 PM

## 2015-12-20 NOTE — Progress Notes (Signed)
Patient refused CPAP at this time. Patient stated his wife is going to bring his home CPAP tomorrow night. RT informed patient to call if he decided to wear it later tonight.

## 2015-12-20 NOTE — Discharge Summary (Signed)
[facility] Discharge Summary  Adam [name] 28-Jun-1951 65 y.o. male  [id number]  Admission Date: 12/15/2015  Discharge Date: 12/20/2015  Physician: Larina Earthly, MD  Admission Diagnosis: vascular  right foot gangrene  HPI:   This is a 65 y.o. male who presented to the emergency department with severe intolerable right foot pain. He had presented with gangrenous changes in his right great toe approximately 3-4 weeks ago. He underwent arteriogram on 11/26/2015. This showed normal aortoiliac segments. He had complete occlusion of his superficial femoral artery from the origin to the above-knee popliteal artery. There was two-vessel runoff initially but complete occlusion of all tibial vessels above the ankle and a very diseased reconstituted dorsalis pedis in the foot. He underwent right femoral to below-knee popliteal bypass on 11/28/2015. He underwent amputation of his right great toe on 12/01/2015. He is having a poorly healing amputation site and this had progressive pain in his right foot. His wife called this morning stating that this is been unbearable. He has not slept for days in his whole right foot is cool. He was instructed to present to the emergency room. He has a history of non-insulin-dependent diabetes.  Hospital Course:  The patient was admitted to the hospital and taken to the operating room on 12/15/2015 - 12/16/2015 and underwent: right below-knee amputation.     The patient tolerated the procedure well and was transported to the PACU in stable condition.   The patient had difficulty with pain control post-operatively and placed on a PCA. His dressing was taken down on POD 2 and his incision was healing well.   POD 3: There was medial incisional purple discoloration of his stump. The patient was still requiring use of his PCA.   POD 4: He continued to have some bruising of his stump. This will be continued to be monitored while in CIR. The skin edges  continued to be intact. His PCA was discontinued and IV antibiotics were discontinued. His pain was manageable on Oxy IR 30 mg q 3 hrs, fentanyl patch, valium, flexeril and IV fentanyl for breakthrough pain. The patient was stable for discharge to CIR on POD 4.   CBC    Component Value Date/Time   WBC 10.4 12/18/2015 0627   RBC 3.85* 12/18/2015 0627   HGB 12.0* 12/18/2015 0627   HCT 35.5* 12/18/2015 0627   PLT 297 12/18/2015 0627   MCV 92.2 12/18/2015 0627   MCH 31.2 12/18/2015 0627   MCHC 33.8 12/18/2015 0627   RDW 12.3 12/18/2015 0627   LYMPHSABS 2.3 11/22/2015 0001   MONOABS 0.8 11/22/2015 0001   EOSABS 0.3 11/22/2015 0001   BASOSABS 0.1 11/22/2015 0001    BMET    Component Value Date/Time   NA 133* 12/18/2015 0627   K 3.9 12/18/2015 0627   CL 95* 12/18/2015 0627   CO2 30 12/18/2015 0627   GLUCOSE 106* 12/18/2015 0627   BUN 11 12/18/2015 0627   CREATININE 0.69 12/18/2015 0627   CREATININE 0.82 11/22/2015 0001   CALCIUM 8.7* 12/18/2015 0627   GFRNONAA >60 12/18/2015 0627   GFRAA >60 12/18/2015 9947     Discharge Instructions:   The patient is discharged to CIR with extensive instructions on wound care and progressive ambulation.  They are instructed not to drive or perform any heavy lifting until returning to see the physician in his office.  Discharge Instructions    Call MD for:  redness, tenderness, or signs of infection (pain, swelling, bleeding, redness,  odor or green/yellow discharge around incision site)    Complete by:  As directed      Call MD for:  severe or increased pain, loss or decreased feeling  in affected limb(s)    Complete by:  As directed      Call MD for:  temperature >100.5    Complete by:  As directed      Discharge wound care:    Complete by:  As directed   Wash wound daily with soap and water and pat dry. Apply dry dressing daily.     Increase activity slowly    Complete by:  As directed   Walk with assistance use walker as needed      Lifting restrictions    Complete by:  As directed   No lifting for 1 week     Resume previous diet    Complete by:  As directed            Discharge Diagnosis:  vascular  right foot gangrene  Secondary Diagnosis: Patient Active Problem List   Diagnosis Date Noted  . Pressure ulcer 12/20/2015  . Vascular insufficiency   . Abnormality of gait   . Status post below knee amputation of right lower extremity ([place])   . DM type 2 with diabetic peripheral neuropathy ([place])   . Essential hypertension   . SIRS (systemic inflammatory response syndrome) ([facility])   . Post-operative pain   . Acute blood loss anemia   . PAD (peripheral artery disease) ([place]) 12/15/2015  . Peripheral vascular disease, unspecified ([place]) 12/07/2015  . Hyperlipidemia associated with type 2 diabetes mellitus ([place]) 12/07/2015  . Great toe amputation status ([place]) 12/07/2015  . Atherosclerosis of native arteries of left leg with ulceration of unspecified site ([place]) 11/28/2015  . Atherosclerosis of native arteries of the extremities with ulceration ([place]) 11/26/2015  . Atherosclerosis of native arteries of the extremities with gangrene ([place]) 11/23/2015  . New onset type 2 diabetes mellitus ([place]) 11/22/2015  . Hypertension associated with diabetes ([place]) 12/04/2014   Past Medical History  Diagnosis Date  . Diabetes mellitus without complication ([place])   . Hypertension   . Peripheral vascular disease ([place])   . Gangrene ([place]) 11/22/2015    right great toe  . Sleep apnea     uses cpap       Medication List    STOP taking these medications        amoxicillin-clavulanate 875-125 MG tablet  Commonly known as:  AUGMENTIN      TAKE these medications        aspirin 81 MG tablet  Take 81 mg by mouth daily.     atorvastatin 10 MG tablet  Commonly known as:  LIPITOR  Take 1 tablet (10 mg total) by mouth daily.     docusate sodium 100 MG capsule  Commonly known as:  COLACE  Take 100 mg by mouth 2 (two) times daily  as needed for mild constipation.     lisinopril-hydrochlorothiazide 20-12.5 MG tablet  Commonly known as:  PRINZIDE,ZESTORETIC  Take 1 tablet by mouth daily.     multivitamin tablet  Take 1 tablet by mouth daily.     ONE TOUCH ULTRA MINI w/Device Kit     ONE TOUCH ULTRA TEST test strip  Generic drug:  glucose blood     ONETOUCH DELICA LANCETS 82N Misc     Oxycodone HCl 10 MG Tabs  Take 1 tablet (10 mg total) by mouth every 4 (four) hours  as needed for moderate pain.     oxyCODONE-acetaminophen 5-325 MG tablet  Commonly known as:  PERCOCET/ROXICET  Take 1-2 tablets by mouth every 6 (six) hours as needed for moderate pain.     oxyCODONE-acetaminophen 10-325 MG tablet  Commonly known as:  PERCOCET  Take 1 tablet by mouth every 4 (four) hours as needed for pain.     traMADol 50 MG tablet  Commonly known as:  ULTRAM  Take 1 tablet (50 mg total) by mouth every 8 (eight) hours as needed.        Disposition: CIR   Patient's condition: is Good  Follow up: 1. Dr. Donnetta Hutching in 4 weeks   Virgina Jock, [place] [facility] [phone] 12/20/2015  1:14 PM

## 2015-12-20 NOTE — H&P (Signed)
Physical Medicine and Rehabilitation Admission H&P   Chief Complaint  Patient presents with  . Foot Pain  : HPI: : Adam [name] is a 65 y.o. right handed male with history of remote tobacco abuse, hypertension, diabetes mellitus and peripheral neuropathy, peripheral vascular disease status post right femoral-popliteal artery bypass 11/28/2015 and gangrenous right great toe status post right great toe amputation 12/01/2015 and chronic pain maintained on oxycodone 15 mg every 4 hours as needed. Patient lives with his wife. Independent prior to admission working full time up until latest femoral bypass surgery. 2 level home with 2 steps to entry and bedroom upstairs. Wife can assist as needed. Presented 12/15/2015 with severe right foot pain and poor healing of recent right great toe amputation with profound ischemic changes. Limb was not felt to be salvageable and underwent right below-knee amputation 12/16/2015 per Dr. Donnetta Hutching. Hospital course pain management with pharmacy consulted for pain control and wean from PCA placed on Oxycodone '30mg'$  every 4 hour,neurontin and fentanyl patch. Subcutaneous Lovenox for DVT prophylaxis. Physical and occupational therapy evaluations completed with recommendations of physical medicine rehabilitation consult. Patient was admitted for a comprehensive rehabilitation program.  Patient states that he was taking oxycodone only for the last couple weeks prior to admission.  ROS Constitutional: Negative for fever and chills.  HENT: Negative for hearing loss.  Eyes: Negative for blurred vision and double vision.  Respiratory: Negative for cough and shortness of breath.  Cardiovascular: Positive for leg swelling. Negative for chest pain and palpitations.  Gastrointestinal: Positive for constipation. Negative for nausea and vomiting.  Genitourinary: Negative for dysuria and hematuria.  Musculoskeletal: Positive for myalgias and joint pain.   Severe  right foot pain  Skin: Negative for rash.  Neurological: Negative for seizures, loss of consciousness and headaches.  All other systems reviewed and are negative   Past Medical History  Diagnosis Date  . Diabetes mellitus without complication ([place])   . Hypertension   . Peripheral vascular disease ([place])   . Gangrene ([place]) 11/22/2015    right great toe  . Sleep apnea     uses cpap   Past Surgical History  Procedure Laterality Date  . Peripheral vascular catheterization N/A 11/26/2015    Procedure: Abdominal Aortogram; Surgeon: [name], MD; Location: [facility]; Service: Cardiovascular; Laterality: N/A;  . Hernia repair Bilateral 2000    inguinal  . Vasectomy    . Colonoscopy    . Femoral-popliteal bypass graft Right 11/28/2015    Procedure: RIGHT FEMORAL-POPLITEAL ARTERY BYPASS GRAFT AND CONSTRUCTION OF MILLER CUFF USING 6 MM X 80 CM PROPATEN GRAFT ; Surgeon: Conrad WaKeeney, MD; Location: [facility]; Service: Vascular; Laterality: Right;  . Amputation Right 12/01/2015    Procedure: AMPUTATION RIGHT GREAT; Surgeon: Conrad , MD; Location: [facility]; Service: Vascular; Laterality: Right;  . Amputation Right 12/16/2015    Procedure: AMPUTATION BELOW KNEE; Surgeon: [name], MD; Location[facility]; Service: Vascular; Laterality: Right;   Family History  Problem Relation Age of Onset  . Hypertension Mother    Social History:  reports that he quit smoking about 4 years ago. He has never used smokeless tobacco. He reports that he drinks about 4.2 oz of alcohol per week. He reports that he does not use illicit drugs. Allergies: No Known Allergies Medications Prior to Admission  Medication Sig Dispense Refill  . amoxicillin-clavulanate (AUGMENTIN) 875-125 MG tablet Take 1 tablet by mouth 2 (two) times daily. 20 tablet 0  . aspirin  81 MG tablet Take 81 mg by mouth  daily.    Marland Kitchen atorvastatin (LIPITOR) 10 MG tablet Take 1 tablet (10 mg total) by mouth daily. (Patient taking differently: Take 10 mg by mouth every other day. ) 90 tablet 3  . docusate sodium (COLACE) 100 MG capsule Take 100 mg by mouth 2 (two) times daily as needed for mild constipation.    Marland Kitchen lisinopril-hydrochlorothiazide (PRINZIDE,ZESTORETIC) 20-12.5 MG per tablet Take 1 tablet by mouth daily. 90 tablet 3  . Multiple Vitamin (MULTIVITAMIN) tablet Take 1 tablet by mouth daily.    Marland Kitchen oxyCODONE 10 MG TABS Take 1 tablet (10 mg total) by mouth every 4 (four) hours as needed for moderate pain. (Patient taking differently: Take 15 mg by mouth every 4 (four) hours as needed for moderate pain. ) 50 tablet 0  . oxyCODONE-acetaminophen (PERCOCET) 10-325 MG tablet Take 1 tablet by mouth every 4 (four) hours as needed for pain. 30 tablet 0  . oxyCODONE-acetaminophen (PERCOCET/ROXICET) 5-325 MG tablet Take 1-2 tablets by mouth every 6 (six) hours as needed for moderate pain. 30 tablet 0  . Blood Glucose Monitoring Suppl (ONE TOUCH ULTRA MINI) w/Device KIT     . ONE TOUCH ULTRA TEST test strip     . ONETOUCH DELICA LANCETS 65H MISC     . traMADol (ULTRAM) 50 MG tablet Take 1 tablet (50 mg total) by mouth every 8 (eight) hours as needed. (Patient not taking: Reported on 12/15/2015) 30 tablet 0    Home: Home Living Family/patient expects to be discharged to:: Private residence Living Arrangements: Spouse/significant other Available Help at Discharge: Family, Available 24 hours/day Type of Home: House Home Access: Stairs to enter [employer] of Steps: 4 from garage Entrance Stairs-Rails: None Home Layout: Two level, Bed/bath upstairs, 1/2 bath on main level Alternate Level Stairs-Number of Steps: 15 then landing then 6 more Alternate Level Stairs-Rails: Left Bathroom Shower/Tub: Multimedia programmer: Handicapped height (standard  height toilet in half bath downstairs) Home Equipment: Environmental consultant - 2 wheels, Crutches, Shower seat - built in  Functional History: Prior Function Level of Independence: Independent with assistive device(s) 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[place] Status:  Mobility: Bed Mobility Overal bed mobility: Needs Assistance Bed Mobility: Sit to Supine Supine to sit: HOB elevated, Min assist Sit to supine: Supervision General bed mobility comments: Supervision for safety, cues for technique  Transfers Overall transfer level: Needs assistance Equipment used: Rolling walker (2 wheeled) Transfers: Sit to/from Stand, [employer] Transfers Sit to Stand: Min assist, +2 safety/equipment Stand pivot transfers: Min assist, +2 safety/equipment General transfer comment: Cues for safety, hand placement, technique. Pt with posterior lean in standing and required cues to correct this as well as relax right residual limb in standing. Pt limited by pain.       ADL: ADL Overall ADL's : Needs assistance/impaired Eating/Feeding: Sitting, Set up Grooming: Set up, Sitting Upper Body Bathing: Set up, Sitting Lower Body Bathing: Moderate assistance, Sit to/from stand Upper Body Dressing : Set up, Sitting Lower Body Dressing: Moderate assistance, Maximal assistance, Sit to/from stand Toilet Transfer: Minimal assistance, Stand-pivot, BSC, Cueing for safety, +2 for safety/equipment Toilet Transfer Details (indicate cue type and reason): simulated transfer, pt transferred EOB to recliner Functional mobility during ADLs: Minimal assistance, Rolling walker, +2 for safety/equipment General ADL Comments: Pt educated on not placing a pillow under his knee, instead promoting knee extension while seated in recliner or lying in bed. Pt and wife eager to go  to CIR.   Cognition: Cognition Overall Cognitive Status: Within Functional Limits for tasks assessed Orientation Level: Oriented  X4 Cognition Arousal/Alertness: Awake/alert Behavior During Therapy: WFL for tasks assessed/performed Overall Cognitive Status: Within Functional Limits for tasks assessed  Physical Exam: Blood pressure 130/69, pulse 82, temperature 98.7 F (37.1 C), temperature source Oral, resp. rate 20, height '5\' 11"'$  (1.803 m), weight 81.4 kg (179 lb 7.3 oz), SpO2 97 %. Physical Exam Constitutional: He is oriented to person, place, and time. He appears well-developed and well-nourished.  HENT:  Head: Normocephalic and atraumatic.  Eyes: Conjunctivae and EOM are normal.  Neck: Normal range of motion. Neck supple. No thyromegaly present.  Cardiovascular: Normal rate, regular rhythm and intact distal pulses.  Murmur heard. Respiratory: Effort normal and breath sounds normal. No respiratory distress.  GI: Soft. Bowel sounds are normal. He exhibits no distension.  Musculoskeletal: He exhibits edema (RLE) and tenderness (RLE).  Neurological: He is alert and oriented to person, place, and time.  Sensation intact to light touch Motor: B/l UE, LLE 5/5 RLE: hip flexion 3+/5 (pain inhibition) Left lower extremity 5/5 in the hip flexor and extensor ankle dorsiflexor. Skin: Skin is warm and dry.  Right knee flexes to less than 90 extends to full Psychiatric: He has a normal mood and affect. His behavior is normal Right stump wound is clean dry and intact. He does have a bulbous distal aspect with moderate ecchymosis   Lab Results Last 48 Hours    Results for orders placed or performed during the hospital encounter of 12/15/15 (from the past 48 hour(s))  Glucose, capillary Status: None   Collection Time: 12/16/15 9:12 AM  Result Value Ref Range   Glucose-Capillary 96 65 - 99 mg/dL   Comment 1 Notify RN    Comment 2 Document in Chart   Glucose, capillary Status: Abnormal   Collection Time: 12/16/15 11:26 AM  Result Value Ref Range   Glucose-Capillary 120 (H)  65 - 99 mg/dL  Glucose, capillary Status: Abnormal   Collection Time: 12/16/15 4:30 PM  Result Value Ref Range   Glucose-Capillary 134 (H) 65 - 99 mg/dL  Glucose, capillary Status: Abnormal   Collection Time: 12/16/15 9:10 PM  Result Value Ref Range   Glucose-Capillary 118 (H) 65 - 99 mg/dL   Comment 1 Notify RN    Comment 2 Document in Chart   Basic metabolic panel Status: Abnormal   Collection Time: 12/17/15 4:30 AM  Result Value Ref Range   Sodium 137 135 - 145 mmol/L   Potassium 3.9 3.5 - 5.1 mmol/L   Chloride 98 (L) 101 - 111 mmol/L   CO2 30 22 - 32 mmol/L   Glucose, Bld 138 (H) 65 - 99 mg/dL   BUN 9 6 - 20 mg/dL   Creatinine, Ser 0.72 0.61 - 1.24 mg/dL   Calcium 9.3 8.9 - 10.3 mg/dL   GFR calc non Af Amer >60 >60 mL/min   GFR calc Af Amer >60 >60 mL/min    Comment: (NOTE) The eGFR has been calculated using the CKD EPI equation. This calculation has not been validated in all clinical situations. eGFR's persistently <60 mL/min signify possible Chronic Kidney Disease.    Anion gap 9 5 - 15  CBC Status: Abnormal   Collection Time: 12/17/15 4:30 AM  Result Value Ref Range   WBC 11.8 (H) 4.0 - 10.5 K/uL   RBC 4.09 (L) 4.22 - 5.81 MIL/uL   Hemoglobin 12.6 (L) 13.0 - 17.0 g/dL   HCT  38.2 (L) 39.0 - 52.0 %   MCV 93.4 78.0 - 100.0 fL   MCH 30.8 26.0 - 34.0 pg   MCHC 33.0 30.0 - 36.0 g/dL   RDW 12.2 11.5 - 15.5 %   Platelets 327 150 - 400 K/uL  Glucose, capillary Status: None   Collection Time: 12/17/15 8:07 AM  Result Value Ref Range   Glucose-Capillary 91 65 - 99 mg/dL  Glucose, capillary Status: Abnormal   Collection Time: 12/17/15 11:20 AM  Result Value Ref Range   Glucose-Capillary 104 (H) 65 - 99 mg/dL   Comment 1 Notify RN   Glucose, capillary Status: Abnormal   Collection Time:  12/17/15 4:14 PM  Result Value Ref Range   Glucose-Capillary 112 (H) 65 - 99 mg/dL   Comment 1 Notify RN   Glucose, capillary Status: Abnormal   Collection Time: 12/17/15 8:37 PM  Result Value Ref Range   Glucose-Capillary 111 (H) 65 - 99 mg/dL   Comment 1 Notify RN    Comment 2 Document in Chart   Glucose, capillary Status: None   Collection Time: 12/18/15 6:28 AM  Result Value Ref Range   Glucose-Capillary 96 65 - 99 mg/dL   Comment 1 Notify RN    Comment 2 Document in Chart       Imaging Results (Last 48 hours)    No results found.       Medical Problem List and Plan: 1. Right BKA 12/16/2015 secondary to secondary to PVD with profound ischemic changes 2. DVT Prophylaxis/Anticoagulation: Subcutaneous Lovenox. Monitor platelet counts and any signs of bleeding 3. Pain Management: Celebrex 200 mg twice a day, fentanyl patch 50 g, Neurontin 300 mg twice a day,Oxycodone '30mg'$  every 4 hours,robaxin as needed .Monitor mental status closely and taper as tolerated in a few days. 4. Hypertension. Hydrochlorothiazide 12.5 mg daily, lisinopril 20 mg daily. Monitor with increased mobility 5. Neuropsych: This patient is capable of making decisions on his own behalf. 6. Skin/Wound Care: Routine skin checks 7. Fluids/Electrolytes/Nutrition: Routine I&O with follow-up chemistries 8. Diabetes mellitus of peripheral neuropathy. Hemoglobin A1c 5.7. Check blood sugar before meals and at bedtime. Sliding scale insulin. Patient diet controlled prior to admission 9. Hyperlipidemia. Lipitor    Post Admission Physician Evaluation: 1. Functional deficits secondary to Right below-knee amputation with gait disorder. 2. Patient is admitted to receive collaborative, interdisciplinary care between the physiatrist, rehab nursing staff, and therapy team. 3. Patient's level of medical complexity and substantial therapy needs in context of that  medical necessity cannot be provided at a lesser intensity of care such as a SNF. 4. Patient has experienced substantial functional loss from his/her baseline which was documented above under the "Functional History" and "Functional Status" headings. Judging by the patient's diagnosis, physical exam, and functional history, the patient has potential for functional progress which will result in measurable gains while on inpatient rehab. These gains will be of substantial and practical use upon discharge in facilitating mobility and self-care at the household level. 5. Physiatrist will provide 24 hour management of medical needs as well as oversight of the therapy plan/treatment and provide guidance as appropriate regarding the interaction of the two. 6. 24 hour rehab nursing will assist with bladder management, bowel management, safety, skin/wound care, disease management, medication administration, pain management and patient education and help integrate therapy concepts, techniques,education, etc. 7. PT will assess and treat for/with: pre gait, gait training, endurance , safety, equipment, neuromuscular re education Goals are: Mod I. 8. OT will assess and  treat for/with: ADLs, Cognitive perceptual skills, Neuromuscular re education, safety, endurance, equipment. Goals are: Mod I seated. Therapy may proceed with showering this patient. 9. SLP will assess and treat for/with: NA. Goals are: NA. 10. Case Management and Social Worker will assess and treat for psychological issues and discharge planning. 11. Team conference will be held weekly to assess progress toward goals and to determine barriers to discharge. 12. Patient will receive at least 3 hours of therapy per day at least 5 days per week. 13. ELOS: 7-10D  14. Prognosis: excellent     Charlett Blake M.D. [facility] FAAPM&R (Sports Med, Neuromuscular Med) Diplomate Am Board of Electrodiagnostic  Med  12/20/2015

## 2015-12-20 NOTE — Telephone Encounter (Signed)
RETURNED [place] STD QUESTION.  LEFT MESSAGE TO CALL ME BACK.

## 2015-12-20 NOTE — Progress Notes (Signed)
Pt arrived at 76 with wife at bedside. RN reviewed rehab booklet, process, and safety plan with verbal understanding. Pt pain 10/10 with minimal relief from medications. PA, [place] aware. Call bell within reach. Will continue to monitor.

## 2015-12-20 NOTE — Progress Notes (Addendum)
  Progress Note    12/20/2015 7:37 AM 4 Days Post-Op 3 Subjective:  Continues to have pain in his right stump  Tm 100 now 99.8 HR 70's-80's NSR [id number] systolic [id number] RA   Filed Vitals:   12/19/15 2031 12/20/15 0458  BP: 134/65 117/68  Pulse: 86 81  Temp: 100 F (37.8 C) 99.8 F (37.7 C)  Resp: 18 18    Physical Exam: Incisions:  Clean with staples in tact; bruising anteriorly and posteriorly/ right groin incision is healing nicely.   CBC    Component Value Date/Time   WBC 10.4 12/18/2015 0627   RBC 3.85* 12/18/2015 0627   HGB 12.0* 12/18/2015 0627   HCT 35.5* 12/18/2015 0627   PLT 297 12/18/2015 0627   MCV 92.2 12/18/2015 0627   MCH 31.2 12/18/2015 0627   MCHC 33.8 12/18/2015 0627   RDW 12.3 12/18/2015 0627   LYMPHSABS 2.3 11/22/2015 0001   MONOABS 0.8 11/22/2015 0001   EOSABS 0.3 11/22/2015 0001   BASOSABS 0.1 11/22/2015 0001    BMET    Component Value Date/Time   NA 133* 12/18/2015 0627   K 3.9 12/18/2015 0627   CL 95* 12/18/2015 0627   CO2 30 12/18/2015 0627   GLUCOSE 106* 12/18/2015 0627   BUN 11 12/18/2015 0627   CREATININE 0.69 12/18/2015 0627   CREATININE 0.82 11/22/2015 0001   CALCIUM 8.7* 12/18/2015 0627   GFRNONAA >60 12/18/2015 0627   GFRAA >60 12/18/2015 0627    INR    Component Value Date/Time   INR 1.07 12/16/2015 0322     Intake/Output Summary (Last 24 hours) at 12/20/15 0737 Last data filed at 12/20/15 [id number]  Gross per 24 hour  Intake    480 ml  Output   2250 ml  Net  -1770 ml     Assessment/Plan:  65 y.o. male is s/p right below knee amputation  4 Days Post-Op   -bruising of left BKA stump-continue to monitor -pt has not used his PCA since yesterday, but receiving other pain medication: OxyIR 30mg  q3hr Valium 5mg  IV q6hr Fentanyl Patch Fentanyl IV 71mcg Flexeril 10mg  tid  -CIR has been approved -change valium to po -pt receiving IV Ancef-will d/w Dr. Donnetta Hutching when to change to po or if he even needs to  continue   Leontine Locket, PA-C [facility] [phone] 12/20/2015 7:37 AM   I have examined the patient, reviewed and agree with above. Will DC Ancef. Pain control better. Stump has diffuse bruising over the anterior and posterior surface. Skin edges continued to be intact. Discussed with patient and his wife present. Bruising is worrisome and not typical. We'll continue to watch. Okay to transfer to inpatient rehabilitation when bed available  [name], MD 12/20/2015 10:07 AM

## 2015-12-20 NOTE — PMR Pre-admission (Signed)
PMR Admission Coordinator Pre-Admission Assessment  Patient: Adam [name] is an 65 y.o., male MRN: [id number] DOB: [birth year] Height: '5\' 11"'$  (180.3 cm) Weight: 81.4 kg (179 lb 7.3 oz)              Insurance Information HMO:     PPO: Yes     PCP:       IPA:       80/20:       OTHER:  Group # [id number] PRIMARY: All Savers [facility]      Policy#: [id number]      Subscriber: Adam [name] CM Name: [name].      Phone#: [phone]     Fax#: [phone] Pre-Cert#: [id number] X 6 days with update on the 7th day.  Update on 12/26/15      Employer: [employer] Benefits:  Phone #: [phone]     Name: Adam [name]. Date: 07/18/13     Deduct:  $2000 (met $0)      Out of Pocket Max: $4000 (met $229.01)      Life Max: unlimited CIR: 100% after ded met      SNF: 100% after ded met and 60 days max Outpatient: 100% after ded and 30 visits combined     Co-Pay: none Home Health: 100% after ded and 30 visits      Co-Pay: none DME: 100% after ded met     Co-Pay: none Providers: in network  Emergency Contact Information Contact Information    Name Relation Home Work [place] [phone]       Current Medical History  Patient Admitting Diagnosis:  R BKA  History of Present Illness:  A 65 y.o. right handed male with history of remote tobacco abuse, hypertension, diabetes mellitus and peripheral neuropathy, peripheral vascular disease status post right femoral-popliteal artery bypass 11/28/2015 and gangrenous right great toe status post right great toe amputation 12/01/2015 and chronic pain maintained on oxycodone 15 mg every 4 hours as needed. Patient lives with his wife. Independent prior to admission working full time up until latest femoral bypass surgery. 2 level home with 2 steps to entry and bedroom upstairs. Wife can assist as needed. Presented 12/15/2015 with severe right foot pain and poor healing of recent right great toe amputation with profound ischemic changes. Limb was not felt to  be salvageable and underwent right below-knee amputation 12/16/2015 per Dr. Donnetta Hutching. Hospital course pain management with pharmacy consulted for pain control and wean from PCA placed on Oxycodone '30mg'$  every 4 hour,neurontin and fentanyl patch. Subcutaneous Lovenox for DVT prophylaxis. Physical and occupational therapy evaluations completed with recommendations of physical medicine rehabilitation consult. Patient to be  admitted for a comprehensive inpatient rehabilitation program.    Past Medical History  Past Medical History  Diagnosis Date  . Diabetes mellitus without complication ([place])   . Hypertension   . Peripheral vascular disease ([place])   . Gangrene ([place]) 11/22/2015    right great toe  . Sleep apnea     uses cpap    Family History  family history includes Hypertension in his mother.  Prior Rehab/Hospitalizations: No previous rehab admissions.  Did have bypass surgery and a toe amputation in early January 2017.  Has the patient had major surgery during 100 days prior to admission? Yes.  Had bypass surgery 11/24/15 per wife and toe amputation 12/01/15 per wife.  Current Medications   Current facility-administered medications:  .  acetaminophen (TYLENOL) tablet 325-650 mg, 325-650 mg, Oral, Q4H PRN,  650 mg at 12/19/15 2036 **OR** acetaminophen (TYLENOL) suppository 325-650 mg, 325-650 mg, Rectal, Q4H PRN, Samantha J Rhyne, PA-C .  alum & mag hydroxide-simeth (MAALOX/MYLANTA) 200-200-20 MG/5ML suspension 15-30 mL, 15-30 mL, Oral, Q2H PRN, Samantha J Rhyne, PA-C .  aspirin chewable tablet 81 mg, 81 mg, Oral, Daily, Samantha J Rhyne, PA-C, 81 mg at 12/20/15 1230 .  atorvastatin (LIPITOR) tablet 10 mg, 10 mg, Oral, QODAY, Samantha J Rhyne, PA-C, 10 mg at 12/20/15 1230 .  celecoxib (CELEBREX) capsule 200 mg, 200 mg, Oral, BID, Karren Cobble, RPH, 200 mg at 12/20/15 1231 .  cyclobenzaprine (FLEXERIL) tablet 10 mg, 10 mg, Oral, TID PRN, [name], MD, 10 mg at 12/19/15 2141 .  dextrose  5 %-0.45 % sodium chloride infusion, , Intravenous, Continuous, [name], MD, Last Rate: 75 mL/hr at 12/19/15 1456 .  diazepam (VALIUM) tablet 2 mg, 2 mg, Oral, Q6H PRN, Samantha J Rhyne, PA-C .  docusate sodium (COLACE) capsule 100 mg, 100 mg, Oral, BID PRN, Samantha J Rhyne, PA-C .  docusate sodium (COLACE) capsule 100 mg, 100 mg, Oral, BID, Karren Cobble, RPH, 100 mg at 12/20/15 1229 .  enoxaparin (LOVENOX) injection 30 mg, 30 mg, Subcutaneous, Q24H, Samantha J Rhyne, PA-C, 30 mg at 12/20/15 1232 .  fentaNYL (DURAGESIC - dosed mcg/hr) 50 mcg, 50 mcg, Transdermal, Q72H, Karren Cobble, RPH, 50 mcg at 12/19/15 1456 .  fentaNYL (SUBLIMAZE) injection 25-50 mcg, 25-50 mcg, Intravenous, Q2H PRN, Karren Cobble, RPH, 50 mcg at 12/20/15 0506 .  gabapentin (NEURONTIN) capsule 300 mg, 300 mg, Oral, BID, Karren Cobble, RPH, 300 mg at 12/20/15 1103 .  guaiFENesin-dextromethorphan (ROBITUSSIN DM) 100-10 MG/5ML syrup 15 mL, 15 mL, Oral, Q4H PRN, Samantha J Rhyne, PA-C .  hydrALAZINE (APRESOLINE) injection 5 mg, 5 mg, Intravenous, Q20 Min PRN, Samantha J Rhyne, PA-C .  hydrochlorothiazide (MICROZIDE) capsule 12.5 mg, 12.5 mg, Oral, Daily, Arvilla Meres Early, MD, 12.5 mg at 12/20/15 1103 .  insulin aspart (novoLOG) injection 0-15 Units, 0-15 Units, Subcutaneous, TID WC, Samantha J Rhyne, PA-C, 0 Units at 12/17/15 0700 .  labetalol (NORMODYNE,TRANDATE) injection 10 mg, 10 mg, Intravenous, Q10 min PRN, Samantha J Rhyne, PA-C .  lisinopril (PRINIVIL,ZESTRIL) tablet 20 mg, 20 mg, Oral, Daily, [name], MD, 20 mg at 12/20/15 1103 .  metoprolol (LOPRESSOR) injection 2-5 mg, 2-5 mg, Intravenous, Q2H PRN, Samantha J Rhyne, PA-C .  multivitamin with minerals tablet 1 tablet, 1 tablet, Oral, Daily, Samantha J Rhyne, PA-C, 1 tablet at 12/20/15 1231 .  oxyCODONE (Oxy IR/ROXICODONE) immediate release tablet 30 mg, 30 mg, Oral, Q4H, Crystal Trellis Moment, RPH, 30 mg at 12/20/15 1102 .  pantoprazole  (PROTONIX) EC tablet 40 mg, 40 mg, Oral, Daily, Samantha J Rhyne, PA-C, 40 mg at 12/20/15 1232 .  phenol (CHLORASEPTIC) mouth spray 1 spray, 1 spray, Mouth/Throat, PRN, Samantha J Rhyne, PA-C .  potassium chloride SA (K-DUR,KLOR-CON) CR tablet 20-40 mEq, 20-40 mEq, Oral, Daily PRN, Hulen Shouts Rhyne, PA-C  Patients Current Diet: Diet Carb Modified Fluid consistency:: Thin; Room service appropriate?: Yes  Precautions / Restrictions Precautions Precautions: Fall Restrictions Weight Bearing Restrictions: Yes RLE Weight Bearing: Non weight bearing (BKA)   Has the patient had 2 or more falls or a fall with injury in the past year?No  Prior Activity Level Community (5-7x/wk): Went out most days prior to toe amputation and surgery in early January 2017.  Home Assistive Devices / Equipment Home Assistive Devices/Equipment: CBG Meter Home Equipment:  Walker - 2 wheels, Crutches, Shower seat - built in  Prior Device Use: Indicate devices/aids used by the patient prior to current illness, exacerbation or injury? 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  Needed some help.  Wife assisted with dressing post toe amputation.  Indoor Mobility: Did the patient need assistance with walking from room to room (with or without device)? Independent  Stairs: Did the patient need assistance with internal or external stairs (with or without device)? Independent  Functional Cognition: Did the patient need help planning regular tasks such as shopping or remembering to take medications? Independent  Current Functional Level Cognition  Overall Cognitive Status: Within Functional Limits for tasks assessed Orientation Level: Oriented X4    Extremity Assessment (includes  Sensation/Coordination)  Upper Extremity Assessment: Overall WFL for tasks assessed  Lower Extremity Assessment: Defer to PT evaluation RLE Deficits / Details: limited by pain, noted bulbous swelling at distal end of BK amputation wrapped with ace bandage; able to extend hip and knee slowly to neutral in standing, lifts leg with assist, noted active quad, glut and adductor sets with exercises RLE: Unable to fully assess due to pain    ADLs  Overall ADL's : Needs assistance/impaired Eating/Feeding: Sitting, Set up Grooming: Set up, Sitting Upper Body Bathing: Set up, Sitting Lower Body Bathing: Moderate assistance, Sit to/from stand Upper Body Dressing : Set up, Sitting Lower Body Dressing: Moderate assistance, Maximal assistance, Sit to/from stand Toilet Transfer: Minimal assistance, Stand-pivot, BSC, Cueing for safety, +2 for safety/equipment Toilet Transfer Details (indicate cue type and reason): simulated transfer, pt transferred EOB to recliner Functional mobility during ADLs: Minimal assistance, Rolling walker, +2 for safety/equipment General ADL Comments: Pt educated on not placing a pillow under his knee, instead promoting knee extension while seated in recliner or lying in bed. Pt and wife eager to go to CIR.     Mobility  Overal bed mobility: Needs Assistance Bed Mobility: Supine to Sit Supine to sit: Min guard, HOB elevated Sit to supine: Supervision General bed mobility comments: Heavy use of rail for support and cues to use LLE to bridge to scoot bottom to EOB.     Transfers  Overall transfer level: Needs assistance Equipment used: Rolling walker (2 wheeled) Transfers: Sit to/from Stand, [employer] Transfers Sit to Stand: Min assist, Mod assist Stand pivot transfers: Min assist General transfer comment: Cues for safety, hand placement, technique. Pt with posterior lean in standing and required cues to correct this as well as relax right residual limb in standing. Cues for  upright as pt with flexed posture. Stood from [employer], from [facility] x1, from chair x1. SPT chair to/from [facility].    Ambulation / Gait / Stairs / Wheelchair Mobility  Ambulation/Gait Ambulation/Gait assistance: Museum/gallery curator (Feet): 5 Feet (+4') Assistive device: Rolling walker (2 wheeled) Gait Pattern/deviations: Trunk flexed ("hop to") General Gait Details: Cues for RW proximity, positioning. Difficulty extending RLE due to pain despite cues. Tremoring right quad. Gait velocity interpretation: Below normal speed for age/gender    Posture / Balance Dynamic Sitting Balance Sitting balance - Comments: Posterior lean with dynamic activities - performing bath sitting EOB.  Balance Overall balance assessment: Needs assistance Sitting-balance support: Feet supported, Bilateral upper extremity supported Sitting balance-Leahy Scale: Fair Sitting balance - Comments: Posterior lean with dynamic activities -  performing bath sitting EOB.  Postural control: Posterior lean Standing balance support: During functional activity Standing balance-Leahy Scale: Poor Standing balance comment: Reliant on RW for support. Total A for peri care.     Special needs/care consideration BiPAP/CPAP Yes, has CPAP at home and in the acute hospital CPM No Continuous Drip IV KVO Dialysis No      Life Vest No Oxygen NO Special Bed No Trach Size No Wound Vac (area) No    Skin Has a new R BKa incision with dressing and ace wrap.  Has a decubitus ulcer on right buttock per wife new since admission.  Has a rash on his right face.                            Bowel mgmt: Last BM 12/19/15 Bladder mgmt: Voiding in urinal WDL Diabetic mgmt Yes, is a diabetic, new diagnosis.    Previous Home Environment Living Arrangements: Spouse/significant other Available Help at Discharge: Family, Available 24 hours/day Type of Home: House Home Layout: Two level, Bed/bath upstairs, 1/2 bath on main level Alternate Level  Stairs-Rails: Left Alternate Level Stairs-Number of Steps: 15 then landing then 6 more Home Access: Stairs to enter Entrance Stairs-Rails: None Entrance Stairs-Number of Steps: 4 from garage Bathroom Shower/Tub: Multimedia programmer: Handicapped height (standard height toilet in half bath downstairs) [place]: No  Discharge Living Setting Plans for Discharge Living Setting: Patient's home, House, Lives with (comment) (Lives with wife.) Type of Home at Discharge: House Discharge Home Layout: Multi-level, 1/2 bath on main level, Bed/bath upstairs Alternate Level Stairs-Number of Steps: 13 steps then a landing then 6 steps Discharge Home Access: Stairs to enter Entrance Stairs-Rails: None Entrance Stairs-Number of Steps: 2 steps at garage entry Does the patient have any problems obtaining your medications?: No  Social/Family/Support Systems Patient Roles: Spouse (Has a wife) Contact Information: [place] [facility] - wife Anticipated Caregiver: wife and self Anticipated Caregiver's Contact Information: [name] - wife [phone] Ability/Limitations of Caregiver: Wife works PT, has a flexible schedule Caregiver Availability: 24/7 Discharge Plan Discussed with Primary Caregiver: Yes Is Caregiver In Agreement with Plan?: Yes Does Caregiver/Family have Issues with Lodging/Transportation while Pt is in Rehab?: No  Goals/Additional Needs Patient/Family Goal for Rehab: Mod I and supervision goals Expected length of stay: 7-10 days Cultural Considerations: None Dietary Needs: Carb mod, med calorie, thin liquids diet Equipment Needs: TBD Pt/Family Agrees to Admission and willing to participate: Yes Program Orientation Provided & Reviewed with Pt/Caregiver Including Roles  & Responsibilities: Yes  Decrease burden of Care through IP rehab admission: N/A  Possible need for SNF placement upon discharge: Not planned  Patient Condition: This patient's medical and functional  status has changed since the consult dated: 12/17/15 in which the Rehabilitation Physician determined and documented that the patient's condition is appropriate for intensive rehabilitative care in an inpatient rehabilitation facility. See "History of Present Illness" (above) for medical update. Functional changes are: Currently requiring min/mod assist for transfers and ambulated 5 feet RW min assist. Patient's medical and functional status update has been discussed with the Rehabilitation physician and patient remains appropriate for inpatient rehabilitation. Will admit to inpatient rehab today.  Preadmission Screen Completed By:  Retta Diones, 12/20/2015 1:06 PM ______________________________________________________________________   Discussed status with Dr. Letta Pate on 12/20/15 at 1306 and received telephone approval for admission today.  Admission Coordinator:  Retta Diones, time1306/Date02/02/17

## 2015-12-20 NOTE — Progress Notes (Signed)
Rehab admissions - Noted patient is medically ready today for inpatient rehab admission.  I spoke with wife and patient and they are in agreement to inpatient rehab admission.  Bed available and will admit to acute inpatient rehab today.  Call me for questions.  RC:[phone]

## 2015-12-20 NOTE — Progress Notes (Signed)
Per note by CIR Liaison- patient will be admitted to CIR today. CSW will sign off.  Lorie Phenix. Pauline Good, [place]

## 2015-12-20 NOTE — Progress Notes (Signed)
MEDICATION RELATED CONSULT NOTE - FOLLOW UP   Pharmacy Consult for Pain management Indication:   No Known Allergies  Patient Measurements: Height: 5\' 11"  (180.3 cm) Weight: 179 lb 7.3 oz (81.4 kg) IBW/kg (Calculated) : 75.3 Adjusted Body Weight:   Vital Signs: Temp: 99.8 F (37.7 C) (02/02 0458) Temp Source: Oral (02/02 0458) BP: 117/68 mmHg (02/02 0458) Pulse Rate: 81 (02/02 0458) Intake/Output from previous day: 02/01 0701 - 02/02 0700 In: 480 [P.O.:480] Out: 2675 [Urine:2675] Intake/Output from this shift: Total I/O In: 240 [P.O.:240] Out: -   Labs:  Recent Labs  12/18/15 0627  WBC 10.4  HGB 12.0*  HCT 35.5*  PLT 297  CREATININE 0.69   Estimated Creatinine Clearance: 98 mL/min (by C-G formula based on Cr of 0.69).   Microbiology:   Assessment: 65 yo who came in 1/28 for extreme foot pain with gangrenous changes to the great right toe a few weeks ago. He got an amputation of the toe 1/14. He now has ongoing critical ischemia. He is being admitted for pain control now and amputation 1/29. Pharmacy consulted to assist with pain control. We are going to use dilaudid PCA. I'm going to add a basal rate to the PCA to get the acute pain in control.    12/20/15:  1045: Spoke with patient (wife not present) who told me that his pain was "better" and that he was ok to not make any changes to his medications today. Documented pain scores are improved, but I'm not entirely confident the patient understands the intensities in the pain scale as he told the rehab PA he was still at a "10." I would like to see if we can keep his pain on a tolerable and more consistent level for a day or two then taper off of the oxycodone dose/frequency slowly due to long-term use, then the Fentanyl patch eventually. Had the opportunity to discuss the patient, his PTA Oxy use, currently regimen, and recommendations with Marlowe Sh[place] PA for rehab.  Current Plan:   1. Celebrex 200mg  BID for  inflammation of tissue 2. Gabapentin 300mg  BID (can titrate up every 3 days) for direct nerve injury pain 3. Oxy IR to 30mg  q 4 hrs. Plan to taper in a few days. 4. Fentanyl 50mcg IV q 2h prn 5. Fentanyl to 1mcg/hr patch q 72h 6. Plan transfer to rehab today. Pharmacy would be happy to assist if desired.   Goal of Therapy:  Improved pain scores    Tianne Plott S. Alford Highland, PharmD, BCPS Clinical Staff Pharmacist Pager [phone]  Eilene Ghazi Stillinger 12/20/2015,11:07 AM

## 2015-12-21 ENCOUNTER — Inpatient Hospital Stay (HOSPITAL_COMMUNITY): Payer: No Typology Code available for payment source | Admitting: Occupational Therapy

## 2015-12-21 ENCOUNTER — Encounter: Payer: No Typology Code available for payment source | Admitting: Vascular Surgery

## 2015-12-21 ENCOUNTER — Telehealth: Payer: Self-pay | Admitting: Vascular Surgery

## 2015-12-21 ENCOUNTER — Inpatient Hospital Stay (HOSPITAL_COMMUNITY): Payer: Medicare Other | Admitting: Physical Therapy

## 2015-12-21 DIAGNOSIS — D62 Acute posthemorrhagic anemia: Secondary | ICD-10-CM

## 2015-12-21 DIAGNOSIS — R740 Nonspecific elevation of levels of transaminase and lactic acid dehydrogenase [LDH]: Secondary | ICD-10-CM

## 2015-12-21 DIAGNOSIS — G894 Chronic pain syndrome: Secondary | ICD-10-CM | POA: Insufficient documentation

## 2015-12-21 DIAGNOSIS — R7401 Elevation of levels of liver transaminase levels: Secondary | ICD-10-CM | POA: Insufficient documentation

## 2015-12-21 DIAGNOSIS — D72829 Elevated white blood cell count, unspecified: Secondary | ICD-10-CM

## 2015-12-21 DIAGNOSIS — E871 Hypo-osmolality and hyponatremia: Secondary | ICD-10-CM | POA: Insufficient documentation

## 2015-12-21 DIAGNOSIS — G8918 Other acute postprocedural pain: Secondary | ICD-10-CM

## 2015-12-21 LAB — GLUCOSE, CAPILLARY
GLUCOSE-CAPILLARY: 105 mg/dL — AB (ref 65–99)
GLUCOSE-CAPILLARY: 95 mg/dL (ref 65–99)
GLUCOSE-CAPILLARY: 114 mg/dL — AB (ref 65–99)
Glucose-Capillary: 103 mg/dL — ABNORMAL HIGH (ref 65–99)
Glucose-Capillary: 107 mg/dL — ABNORMAL HIGH (ref 65–99)

## 2015-12-21 LAB — COMPREHENSIVE METABOLIC PANEL
ALT: 99 U/L — ABNORMAL HIGH (ref 17–63)
ANION GAP: 9 (ref 5–15)
AST: 153 U/L — AB (ref 15–41)
Albumin: 2.6 g/dL — ABNORMAL LOW (ref 3.5–5.0)
Alkaline Phosphatase: 76 U/L (ref 38–126)
BUN: 14 mg/dL (ref 6–20)
CHLORIDE: 93 mmol/L — AB (ref 101–111)
CO2: 31 mmol/L (ref 22–32)
Calcium: 8.9 mg/dL (ref 8.9–10.3)
Creatinine, Ser: 0.78 mg/dL (ref 0.61–1.24)
Glucose, Bld: 117 mg/dL — ABNORMAL HIGH (ref 65–99)
POTASSIUM: 4.4 mmol/L (ref 3.5–5.1)
Sodium: 133 mmol/L — ABNORMAL LOW (ref 135–145)
Total Bilirubin: 0.4 mg/dL (ref 0.3–1.2)
Total Protein: 5.9 g/dL — ABNORMAL LOW (ref 6.5–8.1)

## 2015-12-21 LAB — CBC WITH DIFFERENTIAL/PLATELET
BASOS ABS: 0 10*3/uL (ref 0.0–0.1)
Basophils Relative: 0 %
EOS PCT: 2 %
Eosinophils Absolute: 0.3 10*3/uL (ref 0.0–0.7)
HCT: 32.9 % — ABNORMAL LOW (ref 39.0–52.0)
Hemoglobin: 11.3 g/dL — ABNORMAL LOW (ref 13.0–17.0)
LYMPHS PCT: 8 %
Lymphs Abs: 0.9 10*3/uL (ref 0.7–4.0)
MCH: 31.1 pg (ref 26.0–34.0)
MCHC: 34.3 g/dL (ref 30.0–36.0)
MCV: 90.6 fL (ref 78.0–100.0)
MONO ABS: 1.1 10*3/uL — AB (ref 0.1–1.0)
Monocytes Relative: 9 %
Neutro Abs: 9.7 10*3/uL — ABNORMAL HIGH (ref 1.7–7.7)
Neutrophils Relative %: 81 %
PLATELETS: 307 10*3/uL (ref 150–400)
RBC: 3.63 MIL/uL — ABNORMAL LOW (ref 4.22–5.81)
RDW: 12.2 % (ref 11.5–15.5)
WBC: 12 10*3/uL — ABNORMAL HIGH (ref 4.0–10.5)

## 2015-12-21 NOTE — Progress Notes (Signed)
Social Work  Social Work Assessment and Plan  Patient Details  Name: Adam [name] MRN: [id number] Date of Birth: [birth year]  Today's Date: 12/21/2015  Problem List:  Patient Active Problem List   Diagnosis Date Noted  . Hyponatremia   . Leukocytosis   . Transaminitis   . Chronic pain syndrome   . Pressure ulcer 12/20/2015  . Vascular insufficiency   . Abnormality of gait   . Status post below knee amputation of right lower extremity ([place])   . DM type 2 with diabetic peripheral neuropathy ([place])   . Essential hypertension   . SIRS (systemic inflammatory response syndrome) ([facility])   . Post-operative pain   . Acute blood loss anemia   . PAD (peripheral artery disease) ([place]) 12/15/2015  . Peripheral vascular disease, unspecified ([place]) 12/07/2015  . Hyperlipidemia associated with type 2 diabetes mellitus ([place]) 12/07/2015  . Great toe amputation status ([place]) 12/07/2015  . Atherosclerosis of native arteries of left leg with ulceration of unspecified site ([place]) 11/28/2015  . Atherosclerosis of native arteries of the extremities with ulceration ([place]) 11/26/2015  . Atherosclerosis of native arteries of the extremities with gangrene ([place]) 11/23/2015  . New onset type 2 diabetes mellitus ([place]) 11/22/2015  . Hypertension associated with diabetes ([place]) 12/04/2014   Past Medical History:  Past Medical History  Diagnosis Date  . Diabetes mellitus without complication ([place])   . Hypertension   . Peripheral vascular disease ([place])   . Gangrene ([place]) 11/22/2015    right great toe  . Sleep apnea     uses cpap   Past Surgical History:  Past Surgical History  Procedure Laterality Date  . Peripheral vascular catheterization N/A 11/26/2015    Procedure: Abdominal Aortogram;  Surgeon: [name], MD;  Location: [facility];  Service: Cardiovascular;  Laterality: N/A;  . Hernia repair Bilateral 2000    inguinal  . Vasectomy    . Colonoscopy    . Femoral-popliteal bypass graft  Right 11/28/2015    Procedure: RIGHT  FEMORAL-POPLITEAL ARTERY BYPASS GRAFT AND CONSTRUCTION OF MILLER CUFF USING 6 MM X 80 CM PROPATEN  GRAFT ;  Surgeon: Conrad [place], MD;  Location: [facility];  Service: Vascular;  Laterality: Right;  . Amputation Right 12/01/2015    Procedure: AMPUTATION RIGHT GREAT;  Surgeon: Conrad Kinbrae, MD;  Location: [facility];  Service: Vascular;  Laterality: Right;  . Amputation Right 12/16/2015    Procedure: AMPUTATION BELOW KNEE;  Surgeon: [name], MD;  Location: [facility];  Service: Vascular;  Laterality: Right;   Social History:  reports that he quit smoking about 4 years ago. He has never used smokeless tobacco. He reports that he drinks about 4.2 oz of alcohol per week. He reports that he does not use illicit drugs.  Family / Support Systems Marital Status: Married Patient Roles: Spouse, Parent Spouse/Significant Other: wife, Mico Kneifl @ [phone] Children: adult daughters, [name], drilling) and Altha Harm [facility]) Anticipated Caregiver: wife and self Ability/Limitations of Caregiver: Wife works PT, has a flexible schedule Caregiver Availability: 24/7 (in needed) Family Dynamics: Pt describes very supportive family.  Denies any concerns about assist available at d/c.  Social History Preferred language: English Religion: Baptist Cultural Background: NA Education: college Read: Yes Write: Yes Employment Status: Employed Name of Employer: [employer] Return to Work Plans: "Absolutely." Pt fully intends to return as soon as medically cleared to do so. Legal Hisotry/Current Legal Issues: None Guardian/Conservator: None - per MD,  pt capable of making decisions on his own behalf.   Abuse/Neglect Physical Abuse: Denies Verbal Abuse: Denies Sexual Abuse: Denies Exploitation of patient/patient's resources: Denies Self-Neglect: Denies  Emotional Status Pt's affect, behavior adn adjustment status: Pt able to complete the interview without  difficulty.  Offers very direct answers and admits that he is experiencing pain throughout the time.  He quickly denies any emotional distress related to BKA.  "It is what it is...just looking forward to getting my (prosthetic) leg and moving on."  Will monitor and refer for neuropsych input as indicated. Recent Psychosocial Issues: recent prior surgeries made in attempts to avoid BKA Pyschiatric History: None Substance Abuse History: None  Patient / Family Perceptions, Expectations & Goals Pt/Family understanding of illness & functional limitations: Pt with very good understanding of the medical course that took place leading to need for BKA.  Good understanding of his current functional limitations/ need for CIR. Premorbid pt/family roles/activities: Reports he had recovered well from prior to surgeries and was independent in his home (including up/down flight of steps). Anticipated changes in roles/activities/participation: Little change anticipated as pt with mod i goals.  Wife to assist as needed. Pt/family expectations/goals: Pt reports his primary hope at this time is to "get this pain under control."  He has no concerns about regaining an independent and active lifestyle.  Community Resources Express Scripts: None Premorbid Home Care/DME Agencies: None Transportation available at discharge: yes Resource referrals recommended: Support group (specify)  Discharge Planning Living Arrangements: Spouse/significant other Support Systems: Spouse/significant other, Children, Other relatives, Friends/neighbors Type of Residence: Private residence Insurance Resources: Multimedia programmer (specify) (All Savers [employer]) Museum/gallery curator Resources: Employment Museum/gallery curator Screen Referred: No Living Expenses: Higher education careers adviser Management: Patient Does the patient have any problems obtaining your medications?: No Home Management: pt and wife share responsibilities Patient/Family Preliminary Plans: Pt plans to  return home with wife as primary support. Social Work Anticipated Follow Up Needs: HH/OP, Support Group Expected length of stay: 7-10 days  Clinical Impression Pleasant gentleman here following BKA and able to complete assessment interview without difficulty.  Very matter of fact with his answers and focused on therapy and regaining his independence ASAP.  Does note still struggling with pain control and med adjustments being made.  Denies any emotional distress related to BKA - will monitor.  Wife able to provide any assistance needed.  Will follow for support and d/c planning needs.  Adam [name] 12/21/2015, 3:04 PM

## 2015-12-21 NOTE — H&P (View-Only) (Signed)
Physical Medicine and Rehabilitation Admission H&P   Chief Complaint  Patient presents with  . Foot Pain  : HPI: : Adam [name] is a 65 y.o. right handed male with history of remote tobacco abuse, hypertension, diabetes mellitus and peripheral neuropathy, peripheral vascular disease status post right femoral-popliteal artery bypass 11/28/2015 and gangrenous right great toe status post right great toe amputation 12/01/2015 and chronic pain maintained on oxycodone 15 mg every 4 hours as needed. Patient lives with his wife. Independent prior to admission working full time up until latest femoral bypass surgery. 2 level home with 2 steps to entry and bedroom upstairs. Wife can assist as needed. Presented 12/15/2015 with severe right foot pain and poor healing of recent right great toe amputation with profound ischemic changes. Limb was not felt to be salvageable and underwent right below-knee amputation 12/16/2015 per Dr. Donnetta Hutching. Hospital course pain management with pharmacy consulted for pain control and wean from PCA placed on Oxycodone '30mg'$  every 4 hour,neurontin and fentanyl patch. Subcutaneous Lovenox for DVT prophylaxis. Physical and occupational therapy evaluations completed with recommendations of physical medicine rehabilitation consult. Patient was admitted for a comprehensive rehabilitation program.  Patient states that he was taking oxycodone only for the last couple weeks prior to admission.  ROS Constitutional: Negative for fever and chills.  HENT: Negative for hearing loss.  Eyes: Negative for blurred vision and double vision.  Respiratory: Negative for cough and shortness of breath.  Cardiovascular: Positive for leg swelling. Negative for chest pain and palpitations.  Gastrointestinal: Positive for constipation. Negative for nausea and vomiting.  Genitourinary: Negative for dysuria and hematuria.  Musculoskeletal: Positive for myalgias and joint pain.   Severe  right foot pain  Skin: Negative for rash.  Neurological: Negative for seizures, loss of consciousness and headaches.  All other systems reviewed and are negative   Past Medical History  Diagnosis Date  . Diabetes mellitus without complication ([place])   . Hypertension   . Peripheral vascular disease ([place])   . Gangrene ([place]) 11/22/2015    right great toe  . Sleep apnea     uses cpap   Past Surgical History  Procedure Laterality Date  . Peripheral vascular catheterization N/A 11/26/2015    Procedure: Abdominal Aortogram; Surgeon: [name], MD; Location: [facility]; Service: Cardiovascular; Laterality: N/A;  . Hernia repair Bilateral 2000    inguinal  . Vasectomy    . Colonoscopy    . Femoral-popliteal bypass graft Right 11/28/2015    Procedure: RIGHT FEMORAL-POPLITEAL ARTERY BYPASS GRAFT AND CONSTRUCTION OF MILLER CUFF USING 6 MM X 80 CM PROPATEN GRAFT ; Surgeon: Conrad Glendive, MD; Location: [place]; Service: Vascular; Laterality: Right;  . Amputation Right 12/01/2015    Procedure: AMPUTATION RIGHT GREAT; Surgeon: Conrad Anza, MD; Location: [place]; Service: Vascular; Laterality: Right;  . Amputation Right 12/16/2015    Procedure: AMPUTATION BELOW KNEE; Surgeon: [name], MD; Location: [facility]; Service: Vascular; Laterality: Right;   Family History  Problem Relation Age of Onset  . Hypertension Mother    Social History:  reports that he quit smoking about 4 years ago. He has never used smokeless tobacco. He reports that he drinks about 4.2 oz of alcohol per week. He reports that he does not use illicit drugs. Allergies: No Known Allergies Medications Prior to Admission  Medication Sig Dispense Refill  . amoxicillin-clavulanate (AUGMENTIN) 875-125 MG tablet Take 1 tablet by mouth 2 (two) times daily. 20 tablet 0  . aspirin  81 MG tablet Take 81 mg by mouth  daily.    Marland Kitchen atorvastatin (LIPITOR) 10 MG tablet Take 1 tablet (10 mg total) by mouth daily. (Patient taking differently: Take 10 mg by mouth every other day. ) 90 tablet 3  . docusate sodium (COLACE) 100 MG capsule Take 100 mg by mouth 2 (two) times daily as needed for mild constipation.    Marland Kitchen lisinopril-hydrochlorothiazide (PRINZIDE,ZESTORETIC) 20-12.5 MG per tablet Take 1 tablet by mouth daily. 90 tablet 3  . Multiple Vitamin (MULTIVITAMIN) tablet Take 1 tablet by mouth daily.    Marland Kitchen oxyCODONE 10 MG TABS Take 1 tablet (10 mg total) by mouth every 4 (four) hours as needed for moderate pain. (Patient taking differently: Take 15 mg by mouth every 4 (four) hours as needed for moderate pain. ) 50 tablet 0  . oxyCODONE-acetaminophen (PERCOCET) 10-325 MG tablet Take 1 tablet by mouth every 4 (four) hours as needed for pain. 30 tablet 0  . oxyCODONE-acetaminophen (PERCOCET/ROXICET) 5-325 MG tablet Take 1-2 tablets by mouth every 6 (six) hours as needed for moderate pain. 30 tablet 0  . Blood Glucose Monitoring Suppl (ONE TOUCH ULTRA MINI) w/Device KIT     . ONE TOUCH ULTRA TEST test strip     . ONETOUCH DELICA LANCETS 16X MISC     . traMADol (ULTRAM) 50 MG tablet Take 1 tablet (50 mg total) by mouth every 8 (eight) hours as needed. (Patient not taking: Reported on 12/15/2015) 30 tablet 0    Home: Home Living Family/patient expects to be discharged to:: Private residence Living Arrangements: Spouse/significant other Available Help at Discharge: Family, Available 24 hours/day Type of Home: House Home Access: Stairs to enter [employer] of Steps: 4 from garage Entrance Stairs-Rails: None Home Layout: Two level, Bed/bath upstairs, 1/2 bath on main level Alternate Level Stairs-Number of Steps: 15 then landing then 6 more Alternate Level Stairs-Rails: Left Bathroom Shower/Tub: Multimedia programmer: Handicapped height (standard  height toilet in half bath downstairs) Home Equipment: Environmental consultant - 2 wheels, Crutches, Shower seat - built in  Functional History: Prior Function Level of Independence: Independent with assistive device(s) 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[place] Status:  Mobility: Bed Mobility Overal bed mobility: Needs Assistance Bed Mobility: Sit to Supine Supine to sit: HOB elevated, Min assist Sit to supine: Supervision General bed mobility comments: Supervision for safety, cues for technique  Transfers Overall transfer level: Needs assistance Equipment used: Rolling walker (2 wheeled) Transfers: Sit to/from Stand, [employer] Transfers Sit to Stand: Min assist, +2 safety/equipment Stand pivot transfers: Min assist, +2 safety/equipment General transfer comment: Cues for safety, hand placement, technique. Pt with posterior lean in standing and required cues to correct this as well as relax right residual limb in standing. Pt limited by pain.       ADL: ADL Overall ADL's : Needs assistance/impaired Eating/Feeding: Sitting, Set up Grooming: Set up, Sitting Upper Body Bathing: Set up, Sitting Lower Body Bathing: Moderate assistance, Sit to/from stand Upper Body Dressing : Set up, Sitting Lower Body Dressing: Moderate assistance, Maximal assistance, Sit to/from stand Toilet Transfer: Minimal assistance, Stand-pivot, BSC, Cueing for safety, +2 for safety/equipment Toilet Transfer Details (indicate cue type and reason): simulated transfer, pt transferred EOB to recliner Functional mobility during ADLs: Minimal assistance, Rolling walker, +2 for safety/equipment General ADL Comments: Pt educated on not placing a pillow under his knee, instead promoting knee extension while seated in recliner or lying in bed. Pt and wife eager to go  to CIR.   Cognition: Cognition Overall Cognitive Status: Within Functional Limits for tasks assessed Orientation Level: Oriented  X4 Cognition Arousal/Alertness: Awake/alert Behavior During Therapy: WFL for tasks assessed/performed Overall Cognitive Status: Within Functional Limits for tasks assessed  Physical Exam: Blood pressure 130/69, pulse 82, temperature 98.7 F (37.1 C), temperature source Oral, resp. rate 20, height '5\' 11"'$  (1.803 m), weight 81.4 kg (179 lb 7.3 oz), SpO2 97 %. Physical Exam Constitutional: He is oriented to person, place, and time. He appears well-developed and well-nourished.  HENT:  Head: Normocephalic and atraumatic.  Eyes: Conjunctivae and EOM are normal.  Neck: Normal range of motion. Neck supple. No thyromegaly present.  Cardiovascular: Normal rate, regular rhythm and intact distal pulses.  Murmur heard. Respiratory: Effort normal and breath sounds normal. No respiratory distress.  GI: Soft. Bowel sounds are normal. He exhibits no distension.  Musculoskeletal: He exhibits edema (RLE) and tenderness (RLE).  Neurological: He is alert and oriented to person, place, and time.  Sensation intact to light touch Motor: B/l UE, LLE 5/5 RLE: hip flexion 3+/5 (pain inhibition) Left lower extremity 5/5 in the hip flexor and extensor ankle dorsiflexor. Skin: Skin is warm and dry.  Right knee flexes to less than 90 extends to full Psychiatric: He has a normal mood and affect. His behavior is normal Right stump wound is clean dry and intact. He does have a bulbous distal aspect with moderate ecchymosis   Lab Results Last 48 Hours    Results for orders placed or performed during the hospital encounter of 12/15/15 (from the past 48 hour(s))  Glucose, capillary Status: None   Collection Time: 12/16/15 9:12 AM  Result Value Ref Range   Glucose-Capillary 96 65 - 99 mg/dL   Comment 1 Notify RN    Comment 2 Document in Chart   Glucose, capillary Status: Abnormal   Collection Time: 12/16/15 11:26 AM  Result Value Ref Range   Glucose-Capillary 120 (H)  65 - 99 mg/dL  Glucose, capillary Status: Abnormal   Collection Time: 12/16/15 4:30 PM  Result Value Ref Range   Glucose-Capillary 134 (H) 65 - 99 mg/dL  Glucose, capillary Status: Abnormal   Collection Time: 12/16/15 9:10 PM  Result Value Ref Range   Glucose-Capillary 118 (H) 65 - 99 mg/dL   Comment 1 Notify RN    Comment 2 Document in Chart   Basic metabolic panel Status: Abnormal   Collection Time: 12/17/15 4:30 AM  Result Value Ref Range   Sodium 137 135 - 145 mmol/L   Potassium 3.9 3.5 - 5.1 mmol/L   Chloride 98 (L) 101 - 111 mmol/L   CO2 30 22 - 32 mmol/L   Glucose, Bld 138 (H) 65 - 99 mg/dL   BUN 9 6 - 20 mg/dL   Creatinine, Ser 0.72 0.61 - 1.24 mg/dL   Calcium 9.3 8.9 - 10.3 mg/dL   GFR calc non Af Amer >60 >60 mL/min   GFR calc Af Amer >60 >60 mL/min    Comment: (NOTE) The eGFR has been calculated using the CKD EPI equation. This calculation has not been validated in all clinical situations. eGFR's persistently <60 mL/min signify possible Chronic Kidney Disease.    Anion gap 9 5 - 15  CBC Status: Abnormal   Collection Time: 12/17/15 4:30 AM  Result Value Ref Range   WBC 11.8 (H) 4.0 - 10.5 K/uL   RBC 4.09 (L) 4.22 - 5.81 MIL/uL   Hemoglobin 12.6 (L) 13.0 - 17.0 g/dL   HCT  38.2 (L) 39.0 - 52.0 %   MCV 93.4 78.0 - 100.0 fL   MCH 30.8 26.0 - 34.0 pg   MCHC 33.0 30.0 - 36.0 g/dL   RDW 12.2 11.5 - 15.5 %   Platelets 327 150 - 400 K/uL  Glucose, capillary Status: None   Collection Time: 12/17/15 8:07 AM  Result Value Ref Range   Glucose-Capillary 91 65 - 99 mg/dL  Glucose, capillary Status: Abnormal   Collection Time: 12/17/15 11:20 AM  Result Value Ref Range   Glucose-Capillary 104 (H) 65 - 99 mg/dL   Comment 1 Notify RN   Glucose, capillary Status: Abnormal   Collection Time:  12/17/15 4:14 PM  Result Value Ref Range   Glucose-Capillary 112 (H) 65 - 99 mg/dL   Comment 1 Notify RN   Glucose, capillary Status: Abnormal   Collection Time: 12/17/15 8:37 PM  Result Value Ref Range   Glucose-Capillary 111 (H) 65 - 99 mg/dL   Comment 1 Notify RN    Comment 2 Document in Chart   Glucose, capillary Status: None   Collection Time: 12/18/15 6:28 AM  Result Value Ref Range   Glucose-Capillary 96 65 - 99 mg/dL   Comment 1 Notify RN    Comment 2 Document in Chart       Imaging Results (Last 48 hours)    No results found.       Medical Problem List and Plan: 1. Right BKA 12/16/2015 secondary to secondary to PVD with profound ischemic changes 2. DVT Prophylaxis/Anticoagulation: Subcutaneous Lovenox. Monitor platelet counts and any signs of bleeding 3. Pain Management: Celebrex 200 mg twice a day, fentanyl patch 50 g, Neurontin 300 mg twice a day,Oxycodone '30mg'$  every 4 hours,robaxin as needed .Monitor mental status closely and taper as tolerated in a few days. 4. Hypertension. Hydrochlorothiazide 12.5 mg daily, lisinopril 20 mg daily. Monitor with increased mobility 5. Neuropsych: This patient is capable of making decisions on his own behalf. 6. Skin/Wound Care: Routine skin checks 7. Fluids/Electrolytes/Nutrition: Routine I&O with follow-up chemistries 8. Diabetes mellitus of peripheral neuropathy. Hemoglobin A1c 5.7. Check blood sugar before meals and at bedtime. Sliding scale insulin. Patient diet controlled prior to admission 9. Hyperlipidemia. Lipitor    Post Admission Physician Evaluation: 1. Functional deficits secondary to Right below-knee amputation with gait disorder. 2. Patient is admitted to receive collaborative, interdisciplinary care between the physiatrist, rehab nursing staff, and therapy team. 3. Patient's level of medical complexity and substantial therapy needs in context of that  medical necessity cannot be provided at a lesser intensity of care such as a SNF. 4. Patient has experienced substantial functional loss from his/her baseline which was documented above under the "Functional History" and "Functional Status" headings. Judging by the patient's diagnosis, physical exam, and functional history, the patient has potential for functional progress which will result in measurable gains while on inpatient rehab. These gains will be of substantial and practical use upon discharge in facilitating mobility and self-care at the household level. 5. Physiatrist will provide 24 hour management of medical needs as well as oversight of the therapy plan/treatment and provide guidance as appropriate regarding the interaction of the two. 6. 24 hour rehab nursing will assist with bladder management, bowel management, safety, skin/wound care, disease management, medication administration, pain management and patient education and help integrate therapy concepts, techniques,education, etc. 7. PT will assess and treat for/with: pre gait, gait training, endurance , safety, equipment, neuromuscular re education Goals are: Mod I. 8. OT will assess and  treat for/with: ADLs, Cognitive perceptual skills, Neuromuscular re education, safety, endurance, equipment. Goals are: Mod I seated. Therapy may proceed with showering this patient. 9. SLP will assess and treat for/with: NA. Goals are: NA. 10. Case Management and Social Worker will assess and treat for psychological issues and discharge planning. 11. Team conference will be held weekly to assess progress toward goals and to determine barriers to discharge. 12. Patient will receive at least 3 hours of therapy per day at least 5 days per week. 13. ELOS: 7-10D  14. Prognosis: excellent     Charlett Blake M.D. [facility] FAAPM&R (Sports Med, Neuromuscular Med) Diplomate Am Board of Electrodiagnostic  Med  12/20/2015

## 2015-12-21 NOTE — Care Management Note (Signed)
[facility] Individual Statement of Services  Patient Name:  Adam [name]  Date:  12/21/2015  Welcome to the [facility].  Our goal is to provide you with an individualized program based on your diagnosis and situation, designed to meet your specific needs.  With this comprehensive rehabilitation program, you will be expected to participate in at least 3 hours of rehabilitation therapies Monday-Friday, with modified therapy programming on the weekends.  Your rehabilitation program will include the following services:  Physical Therapy (PT), Occupational Therapy (OT), 24 hour per day rehabilitation nursing, Therapeutic Recreaction (TR), Case Management (Social Worker), Rehabilitation Medicine, Nutrition Services and Pharmacy Services  Weekly team conferences will be held on Wednesdays to discuss your progress.  Your Social Worker will talk with you frequently to get your input and to update you on team discussions.  Team conferences with you and your family in attendance may also be held.  Expected length of stay: 7-10 days  Overall anticipated outcome: modified independent/ supervision  Depending on your progress and recovery, your program may change. Your Social Worker will coordinate services and will keep you informed of any changes. Your Social Worker's name and contact numbers are listed  below.  The following services may also be recommended but are not provided by the [facility] will be made to provide these services after discharge if needed.  Arrangements include referral to agencies that provide these services.  Your insurance has been verified to be:  [employer] [facility] Your primary doctor is:  Dr. Redmond School  Pertinent information will be shared with your doctor and your insurance  company.  Social Worker:  [place], [place] or ([phone]   Information discussed with and copy given to patient by: Lennart Pall, 12/21/2015, 10:48 AM

## 2015-12-21 NOTE — Telephone Encounter (Signed)
LM for pt and his wife on both #s listed with appt information, dpm

## 2015-12-21 NOTE — Telephone Encounter (Signed)
-----   Message from Mena Goes, RN sent at 12/20/2015  4:01 PM EST ----- Regarding: schedule   ----- Message -----    From: Alvia Grove, PA-C    Sent: 12/20/2015   1:09 PM      To: [facility] Charge Pool  S/p right BKA 12/16/15  F/u with TFE in 4 weeks  Thanks Maudie Mercury

## 2015-12-21 NOTE — Procedures (Signed)
Pt uses home cpap machine with home settings does not need any equipment from respiratory at this time. RT will continue to monitor.

## 2015-12-21 NOTE — Interval H&P Note (Signed)
Adam [name] was admitted today to Inpatient Rehabilitation with the diagnosis of Right BKA 12/16/15 secondary to PVD, right foot ischemia.  The patient's history has been reviewed, patient examined, and there is no change in status.  Patient continues to be appropriate for intensive inpatient rehabilitation.  I have reviewed the patient's chart and labs.  Questions were answered to the patient's satisfaction. The PAPE has been reviewed and assessment remains appropriate.  Charlett Blake 12/21/2015, 6:45 AM

## 2015-12-21 NOTE — Progress Notes (Signed)
[facility] PHYSICAL MEDICINE & REHABILITATION     PROGRESS NOTE  Subjective/Complaints:  Pt seen sitting up in bed.  He slept well overnight and states that his pain is manageable at present.  He is ready to begin therapies.    ROS: Denies CP, SOB, n/v/d, phantom pain.   Objective: Vital Signs: Blood pressure 126/66, pulse 84, temperature 98.1 F (36.7 C), temperature source Oral, resp. rate 18, weight 80.604 kg (177 lb 11.2 oz), SpO2 94 %. No results found.  Recent Labs  12/20/15 1608 12/21/15 0530  WBC 12.5* 12.0*  HGB 12.1* 11.3*  HCT 35.4* 32.9*  PLT 316 307    Recent Labs  12/20/15 1608 12/21/15 0530  NA  --  133*  K  --  4.4  CL  --  93*  GLUCOSE  --  117*  BUN  --  14  CREATININE 0.88 0.78  CALCIUM  --  8.9   CBG (last 3)   Recent Labs  12/19/15 2107 12/20/15 0625 12/20/15 1105  GLUCAP 98 93 112*    Wt Readings from Last 3 Encounters:  12/20/15 80.604 kg (177 lb 11.2 oz)  12/15/15 81.4 kg (179 lb 7.3 oz)  12/12/15 82.555 kg (182 lb)    Physical Exam:  BP 126/66 mmHg  Pulse 84  Temp(Src) 98.1 F (36.7 C) (Oral)  Resp 18  Wt 80.604 kg (177 lb 11.2 oz)  SpO2 94% Constitutional: He appears well-developed and well-nourished. NAD.  HENT: Normocephalic and atraumatic.  Eyes: Conjunctivae and EOM are normal.  Cardiovascular: Normal rate, regular rhythm. Murmur heard. Respiratory: Effort normal and breath sounds normal. No respiratory distress. GI: Soft. Bowel sounds are normal. He exhibits no distension.  Musculoskeletal: He exhibits mild edema (RLE) and tenderness (RLE).  Neurological: He is alert and oriented.  Motor: B/l UE, LLE 5/5 RLE: hip flexion 3+/5 (pain inhibition)  LLE: 5/5 proximal to distal Skin: Skin is warm and dry.  Stump c/d/i with moderate ecchymosis. Psychiatric: He has a normal mood and affect. His behavior is normal  Assessment/Plan: 1. Functional deficits secondary to PVD with profound ischemic changes which require  3+ hours per day of interdisciplinary therapy in a comprehensive inpatient rehab setting. Physiatrist is providing close team supervision and 24 hour management of active medical problems listed below. Physiatrist and rehab team continue to assess barriers to discharge/monitor patient progress toward functional and medical goals.  Function:  Bathing Bathing position      Bathing parts      Bathing assist        Upper Body Dressing/Undressing Upper body dressing                    Upper body assist        Lower Body Dressing/Undressing Lower body dressing                                  Lower body assist        Toileting Toileting Toileting activity did not occur: Safety/medical concerns        Toileting assist     Transfers Chair/bed Clinical biochemist          Cognition Comprehension Comprehension assist level: Follows complex conversation/direction with extra time/assistive device  Expression Expression assist level: Expresses  complex ideas: With extra time/assistive device  Social Interaction Social Interaction assist level: Interacts appropriately with others - No medications needed.  Problem Solving Problem solving assist level: Solves complex problems: With extra time  Memory Memory assist level: More than reasonable amount of time    Medical Problem List and Plan: 1. Right BKA 12/16/2015 secondary to secondary to PVD with profound ischemic changes Clean amputation daily with soap and water Monitor incision site for signs of infection or impending skin breakdown. Staples to remain in place for 3-4 weeks Stump shrinker, for edema control  Scar mobilization massaging to prevent soft tissue adherence Stump protector during therapies Prevent flexion contractures by implementing the following:   Encourage prone lying for 20-30 mins per day BID to avoid hip flexion Contractures if  medically appropriate;  Avoid pillow under knees when patient is lying in bed in order to prevent both knee and hip flexion contractures;  Avoid prolonged sitting Post surgical pain control with oral medication Phantom limb pain control with physical modalities including desensitization techniques (gentle self massage to the residual stump,hot packs if sensation iintact, [place]) and mirror therapy, TENS. If ineffective, consider pharmacological treatment for neuropathic pain (e.g gabapentin, pregabalin, amytriptalyine, duloxetine).  When using wheelchair, patient should have knee on amputated side fully extended with board under the seat cushion. Avoid injury to contralateral side  Begin CIR 2. DVT Prophylaxis/Anticoagulation: Subcutaneous Lovenox. Monitor platelet counts and any signs of bleeding 3. Pain Management: Celebrex 200 mg twice a day, fentanyl patch 50 g, Neurontin 300 mg twice a day,Oxycodone 30mg  every 4 hours,robaxin as needed. Monitor mental status closely and taper as tolerated in a few days. 4. Hypertension. Hydrochlorothiazide 12.5 mg daily, lisinopril 20 mg daily. Monitor with increased mobility 5. Neuropsych: This patient is capable of making decisions on his own behalf. 6. Skin/Wound Care: Routine skin checks 7. Fluids/Electrolytes/Nutrition: Routine I&O   Mild Hyponatremia: 133 on 2/3 (stable) - Will cont to monitor  Will order labs for Monday 8. Diabetes mellitus of peripheral neuropathy. Hemoglobin A1c 5.7. Check blood sugar before meals and at bedtime. Sliding scale insulin. Patient diet controlled prior to admission  Fasting CBG 112 this AM 9. Hyperlipidemia. Lipitor 10. Leukocytosis:  12.0 on 2/3 (stable)  Will order labs for Monday 11. ABLA  Hb 11.3 on 2/3  Will order labs for Monday 12. Transaminitis   Will order labs for Monday  LOS (Days) 1 A FACE TO FACE EVALUATION WAS PERFORMED  Ankit Lorie Phenix 12/21/2015 9:25 AM

## 2015-12-21 NOTE — Evaluation (Signed)
Physical Therapy Assessment and Plan  Patient Details  Name: Adam [name] MRN: [id number] Date of Birth: [birth year]  PT Diagnosis: Coordination disorder, Difficulty walking and Muscle weakness Rehab Potential: Excellent ELOS: 7-10   Today's Date: 12/21/2015 PT Individual Time: 0902-1005 PT Individual Time Calculation (min): 63 min    Problem List:  Patient Active Problem List   Diagnosis Date Noted  . Hyponatremia   . Leukocytosis   . Transaminitis   . Chronic pain syndrome   . Pressure ulcer 12/20/2015  . Vascular insufficiency   . Abnormality of gait   . Status post below knee amputation of right lower extremity ([place])   . DM type 2 with diabetic peripheral neuropathy ([place])   . Essential hypertension   . SIRS (systemic inflammatory response syndrome) ([facility])   . Post-operative pain   . Acute blood loss anemia   . PAD (peripheral artery disease) ([place]) 12/15/2015  . Peripheral vascular disease, unspecified ([place]) 12/07/2015  . Hyperlipidemia associated with type 2 diabetes mellitus ([place]) 12/07/2015  . Great toe amputation status ([place]) 12/07/2015  . Atherosclerosis of native arteries of left leg with ulceration of unspecified site ([place]) 11/28/2015  . Atherosclerosis of native arteries of the extremities with ulceration ([place]) 11/26/2015  . Atherosclerosis of native arteries of the extremities with gangrene ([place]) 11/23/2015  . New onset type 2 diabetes mellitus ([place]) 11/22/2015  . Hypertension associated with diabetes ([place]) 12/04/2014    Past Medical History:  Past Medical History  Diagnosis Date  . Diabetes mellitus without complication ([place])   . Hypertension   . Peripheral vascular disease ([place])   . Gangrene ([place]) 11/22/2015    right great toe  . Sleep apnea     uses cpap   Past Surgical History:  Past Surgical History  Procedure Laterality Date  . Peripheral vascular catheterization N/A 11/26/2015    Procedure: Abdominal Aortogram;  Surgeon: [name], MD;   Location: [facility];  Service: Cardiovascular;  Laterality: N/A;  . Hernia repair Bilateral 2000    inguinal  . Vasectomy    . Colonoscopy    . Femoral-popliteal bypass graft Right 11/28/2015    Procedure: RIGHT  FEMORAL-POPLITEAL ARTERY BYPASS GRAFT AND CONSTRUCTION OF MILLER CUFF USING 6 MM X 80 CM PROPATEN  GRAFT ;  Surgeon: Conrad [place] Ewen, MD;  Location: [facility];  Service: Vascular;  Laterality: Right;  . Amputation Right 12/01/2015    Procedure: AMPUTATION RIGHT GREAT;  Surgeon: Conrad Conehatta, MD;  Location: [facility];  Service: Vascular;  Laterality: Right;  . Amputation Right 12/16/2015    Procedure: AMPUTATION BELOW KNEE;  Surgeon: [name], MD;  Location: [facility];  Service: Vascular;  Laterality: Right;    Assessment & Plan Clinical Impression: A 65 y.o. right handed male with history of remote tobacco abuse, hypertension, diabetes mellitus and peripheral neuropathy, peripheral vascular disease status post right femoral-popliteal artery bypass 11/28/2015 and gangrenous right great toe status post right great toe amputation 12/01/2015 and chronic pain maintained on oxycodone 15 mg every 4 hours as needed. Patient lives with his wife. Independent prior to admission working full time up until latest femoral bypass surgery. 2 level home with 2 steps to entry and bedroom upstairs. Wife can assist as needed. Presented 12/15/2015 with severe right foot pain and poor healing of recent right great toe amputation with profound ischemic changes. Limb was not felt to be salvageable and underwent right below-knee amputation 12/16/2015 per Dr. Donnetta Hutching. Hospital course pain  management with pharmacy consulted for pain control and wean from PCA placed on Oxycodone '30mg'$  every 4 hour,neurontin and fentanyl patch. Subcutaneous Lovenox for DVT prophylaxis. Physical and occupational therapy evaluations completed with recommendations of physical medicine rehabilitation consult. Patient to be  admitted for a  comprehensive inpatient rehabilitation program.  Patient transferred to CIR on 12/20/2015 .   Patient currently requires mod with mobility secondary to muscle weakness, decreased cardiorespiratoy endurance, decreased coordination and decreased sitting balance, decreased standing balance, decreased postural control and decreased balance strategies.  Prior to hospitalization, patient was independent  with mobility and lived with Spouse in a House home.  Home access is 4 from garageStairs to enter.  Patient will benefit from skilled PT intervention to maximize safe functional mobility and minimize fall risk for planned discharge home with 24 hour supervision.  Anticipate patient will benefit from follow up [place] at discharge.  PT - End of Session Activity Tolerance: Tolerates 30+ min activity with multiple rests Endurance Deficit: Yes PT Assessment Rehab Potential (ACUTE/IP ONLY): Excellent Barriers to Discharge: Inaccessible home environment PT Patient demonstrates impairments in the following area(s): Balance;Endurance;Motor;Pain;Safety PT Transfers Functional Problem(s): Bed Mobility;Bed to Chair;Car;Furniture PT Locomotion Functional Problem(s): Stairs;Wheelchair Mobility;Ambulation PT Plan PT Intensity: Minimum of 1-2 x/day ,45 to 90 minutes PT Frequency: 5 out of 7 days PT Duration Estimated Length of Stay: 7-10 PT Treatment/Interventions: Ambulation/gait training;DME/adaptive equipment instruction;Neuromuscular re-education;Community reintegration;Stair training;UE/LE Strength taining/ROM;Wheelchair propulsion/positioning;Balance/vestibular training;Discharge planning;Therapeutic Activities;UE/LE Coordination activities;Functional mobility training;Patient/family education;Therapeutic Exercise PT Transfers Anticipated Outcome(s): mod I PT Locomotion Anticipated Outcome(s): mod I w/c and short distance gait, supervision stairs PT Recommendation Recommendations for Other Services: Neuropsych  consult Follow Up Recommendations: Home health PT Patient destination: Home Equipment Recommended: Wheelchair (measurements) Equipment Details: 18x18 w/c with R amputee support pad and basic cushion   Skilled Therapeutic Intervention Pt received resting in bed with wife and daughter present and agreeable to therapy intervention.  Skilled therapeutic intervention began after initial assessment with focus on patient/family education, bed mobility, transfers, gait, stair negotiation, and balance.  Please see below for details on assist levels for pt mobility.  Overall requiring mod assist for transfers, min assist for gait, and mod assist on stairs.  PT discussed estimated length of stay and goals for d/c with pt and daughter and both agreeable to 7-10 day stay with mod I goals.  Pt positioned in w/c at end of session with call bell in reach and needs met.   PT Evaluation Precautions/Restrictions Precautions Precautions: Fall Precaution Comments: R BKA Restrictions Weight Bearing Restrictions: Yes RLE Weight Bearing: Non weight bearing Pain Pain Assessment Pain Assessment: 0-10 Pain Score: 0-No pain (seated, no movement) Pain Location: Leg Pain Orientation: Right Pain Descriptors / Indicators: Aching;Burning;Shooting;Stabbing;Throbbing Pain Intervention(s): RN made aware;Repositioned;Elevated extremity Home Living/Prior Functioning Home Living Available Help at Discharge: Family;Available 24 hours/day Type of Home: House Home Access: Stairs to enter [employer] of Steps: 4 from garage Entrance Stairs-Rails: None Home Layout: Two level;Bed/bath upstairs;1/2 bath on main level Alternate Level Stairs-Number of Steps: 15 then landing then 6 more Alternate Level Stairs-Rails: Left Bathroom Shower/Tub: Walk-in shower (no door or curtain, small threshold (~2-3 inches tall)) Bathroom Toilet: Standard  Lives With: Spouse Prior Function Level of Independence: Independent with  gait  Able to Take Stairs?: Yes Driving: Yes Vocation: Full time employment Vocation Requirements: sells Pharmacist, community Overall Cognitive Status: Within Functional Limits for tasks assessed Arousal/Alertness: Awake/alert Orientation Level: Oriented X4 Sensation Sensation Light Touch: Appears Intact  Coordination Gross Motor Movements are Fluid and Coordinated: Yes Fine Motor Movements are Fluid and Coordinated: Yes Motor  Motor Motor: Within Functional Limits  Mobility Bed Mobility Bed Mobility: Supine to Sit;Sit to Supine Supine to Sit: 4: Min guard Sit to Supine: 4: Min guard Transfers Transfers: Yes Sit to Stand: 3: Mod assist Sit to Stand Details: Verbal cues for sequencing;Verbal cues for technique;Verbal cues for precautions/safety;Verbal cues for safe use of DME/AE;Manual facilitation for weight shifting Stand to Sit: 4: Min guard Squat Pivot Transfers: 3: Mod assist Squat Pivot Transfer Details: Verbal cues for technique;Verbal cues for sequencing;Verbal cues for precautions/safety;Verbal cues for safe use of DME/AE;Manual facilitation for weight shifting Locomotion  Ambulation Ambulation: Yes Ambulation/Gait Assistance: 4: Min assist Ambulation Distance (Feet): 50 Feet Assistive device: Rolling walker Ambulation/Gait Assistance Details: Verbal cues for safe use of DME/AE;Verbal cues for gait pattern;Verbal cues for technique;Verbal cues for precautions/safety Stairs / Additional Locomotion Stairs: Yes Stairs Assistance: 3: Mod assist Stairs Assistance Details: Verbal cues for sequencing;Verbal cues for technique Stair Management Technique: Two rails Number of Stairs: 4 Wheelchair Mobility Wheelchair Mobility: Yes Wheelchair Assistance: 5: Personnel officer Assistance Details: Verbal cues for Marketing executive: Both upper extremities Wheelchair Parts Management: Needs assistance Distance: 150   Trunk/Postural Assessment  Cervical Assessment Cervical Assessment: Within Functional Limits Thoracic Assessment Thoracic Assessment: Within Functional Limits Lumbar Assessment Lumbar Assessment: Within Functional Limits Postural Control Postural Control: Within Functional Limits  Balance Balance Balance Assessed: Yes Dynamic Sitting Balance Dynamic Sitting - Balance Support: Left upper extremity supported;Right upper extremity supported;During functional activity Dynamic Sitting - Level of Assistance: 4: Min assist Sitting balance - Comments: LB dressing Static Standing Balance Static Standing - Balance Support: Bilateral upper extremity supported Static Standing - Level of Assistance: 5: Stand by assistance Dynamic Standing Balance Dynamic Standing - Balance Support: Right upper extremity supported;Left upper extremity supported;During functional activity Dynamic Standing - Level of Assistance: 4: Min assist Extremity Assessment  RUE Assessment RUE Assessment: Within Functional Limits LUE Assessment LUE Assessment: Within Functional Limits RLE Assessment RLE Assessment:  (R BKA, grossly 3/5) LLE Assessment LLE Assessment: Within Functional Limits   See Function Navigator for Current Functional Status.   Refer to Care Plan for Long Term Goals  Recommendations for other services: None  Discharge Criteria: Patient will be discharged from PT if patient refuses treatment 3 consecutive times without medical reason, if treatment goals not met, if there is a change in medical status, if patient makes no progress towards goals or if patient is discharged from hospital.  The above assessment, treatment plan, treatment alternatives and goals were discussed and mutually agreed upon: by patient and by family  Earnest Conroy Penven-Crew 12/21/2015, 12:25 PM

## 2015-12-21 NOTE — Progress Notes (Signed)
Patient information reviewed and entered into [facility] System by Becky Digby Groeneveld, covering PPS coordinator. Information including medical coding and functional independence measure will be reviewed and updated through discharge.   

## 2015-12-21 NOTE — Progress Notes (Signed)
Patient ID: Adam [name], male   DOB: 1951-03-14, 65 y.o.   MRN: [id number]  comfortable. Now in rehabilitation. Dressing removed and appears to be healing. Bruising appears to be resolving. Discussed with the patient and his wife. Continue with rehabilitation. We'll see her again Monday. Please call if assist over the weekend

## 2015-12-21 NOTE — Evaluation (Signed)
Occupational Therapy Assessment and Plan & Session Notes  Patient Details  Name: Adam [name] MRN: [id number] Date of Birth: [birth year]  OT Diagnosis: acute pain and muscle weakness (generalized) Rehab Potential: Rehab Potential (ACUTE ONLY): Good ELOS: 7-10 days   Today's Date: 12/21/2015  Problem List:  Patient Active Problem List   Diagnosis Date Noted  . Hyponatremia   . Leukocytosis   . Transaminitis   . Chronic pain syndrome   . Pressure ulcer 12/20/2015  . Vascular insufficiency   . Abnormality of gait   . Status post below knee amputation of right lower extremity ([place])   . DM type 2 with diabetic peripheral neuropathy ([place])   . Essential hypertension   . SIRS (systemic inflammatory response syndrome) (HCC)   . Post-operative pain   . Acute blood loss anemia   . PAD (peripheral artery disease) ([place]) 12/15/2015  . Peripheral vascular disease, unspecified ([place]) 12/07/2015  . Hyperlipidemia associated with type 2 diabetes mellitus ([place]) 12/07/2015  . Great toe amputation status ([place]) 12/07/2015  . Atherosclerosis of native arteries of left leg with ulceration of unspecified site ([place]) 11/28/2015  . Atherosclerosis of native arteries of the extremities with ulceration ([place]) 11/26/2015  . Atherosclerosis of native arteries of the extremities with gangrene ([place]) 11/23/2015  . New onset type 2 diabetes mellitus ([place]) 11/22/2015  . Hypertension associated with diabetes ([place]) 12/04/2014    Past Medical History:  Past Medical History  Diagnosis Date  . Diabetes mellitus without complication ([place])   . Hypertension   . Peripheral vascular disease ([place])   . Gangrene ([place]) 11/22/2015    right great toe  . Sleep apnea     uses cpap   Past Surgical History:  Past Surgical History  Procedure Laterality Date  . Peripheral vascular catheterization N/A 11/26/2015    Procedure: Abdominal Aortogram;  Surgeon: [name], MD;  Location: [facility];  Service:  Cardiovascular;  Laterality: N/A;  . Hernia repair Bilateral 2000    inguinal  . Vasectomy    . Colonoscopy    . Femoral-popliteal bypass graft Right 11/28/2015    Procedure: RIGHT  FEMORAL-POPLITEAL ARTERY BYPASS GRAFT AND CONSTRUCTION OF MILLER CUFF USING 6 MM X 80 CM PROPATEN  GRAFT ;  Surgeon: Conrad Brightwood, MD;  Location: [facility];  Service: Vascular;  Laterality: Right;  . Amputation Right 12/01/2015    Procedure: AMPUTATION RIGHT GREAT;  Surgeon: Conrad Laramie, MD;  Location: [facility];  Service: Vascular;  Laterality: Right;  . Amputation Right 12/16/2015    Procedure: AMPUTATION BELOW KNEE;  Surgeon: [name], MD;  Location: [facility];  Service: Vascular;  Laterality: Right;    Assessment & Plan Clinical Impression: Adam [name] is a 65 y.o. right handed male with history of remote tobacco abuse, hypertension, diabetes mellitus and peripheral neuropathy, peripheral vascular disease status post right femoral-popliteal artery bypass 11/28/2015 and gangrenous right great toe status post right great toe amputation 12/01/2015 and chronic pain maintained on oxycodone 15 mg every 4 hours as needed. Patient lives with his wife. Independent prior to admission working full time up until latest femoral bypass surgery. 2 level home with 2 steps to entry and bedroom upstairs. Wife can assist as needed. Presented 12/15/2015 with severe right foot pain and poor healing of recent right great toe amputation with profound ischemic changes. Limb was not felt to be salvageable and underwent right below-knee amputation 12/16/2015 per Dr. Donnetta Hutching. Hospital course pain management with  pharmacy consulted for pain control and wean from PCA placed on Oxycodone '30mg'$  every 4 hour,neurontin and fentanyl patch. Subcutaneous Lovenox for DVT prophylaxis. Physical and occupational therapy evaluations completed with recommendations of physical medicine rehabilitation consult. Patient was admitted for a comprehensive rehabilitation  program. Patient transferred to CIR on 12/20/2015 .    Patient currently requires min to mod assist with basic self-care skills and IADL secondary to muscle weakness and increased pain which limits his performance independence.  Prior to hospitalization, patient could complete ADLs and IADLs independently.   Patient will benefit from skilled intervention to increase independence with basic self-care skills prior to discharge home with care partner.  Anticipate patient will require intermittent supervision and additional occupational therapy is TBD.  OT - End of Session Activity Tolerance: Improving Endurance Deficit: Yes OT Assessment Rehab Potential (ACUTE ONLY): Good Barriers to Discharge: Inaccessible home environment Barriers to Discharge Comments: multiple stairs in house OT Patient demonstrates impairments in the following area(s): Balance;Edema;Endurance;Pain;Safety;Sensory;Skin Integrity OT Basic ADL's Functional Problem(s): Grooming;Bathing;Toileting OT Advanced ADL's Functional Problem(s): Simple Meal Preparation OT Transfers Functional Problem(s): Toilet;Tub/Shower OT Additional Impairment(s): Other (comment) (strengthening of BUE) OT Plan OT Intensity: Minimum of 1-2 x/day, 45 to 90 minutes OT Frequency: 5 out of 7 days OT Duration/Estimated Length of Stay: 7-10 days OT Treatment/Interventions: Balance/vestibular training;Community reintegration;Discharge planning;Functional mobility training;Pain management;Patient/family education;Self Care/advanced ADL retraining;Skin care/wound managment;Therapeutic Activities;Therapeutic Exercise;Wheelchair propulsion/positioning;Disease mangement/prevention;DME/adaptive equipment instruction;Psychosocial support;UE/LE Strength taining/ROM OT Self Feeding Anticipated Outcome(s): independent OT Basic Self-Care Anticipated Outcome(s): mod I OT Toileting Anticipated Outcome(s): mod I OT Bathroom Transfers Anticipated Outcome(s): mod I OT  Recommendation Recommendations for Other Services: Neuropsych consult (for coping) Patient destination: Home Follow Up Recommendations: Home health OT (vs none) Equipment Recommended: 3 in 1 bedside comode;Tub/shower bench (potentially a tub bench for his walk in shower, this is still TBD)   OT Evaluation Precautions/Restrictions  Precautions Precautions: Fall Precaution Comments: R BKA Restrictions Weight Bearing Restrictions: Yes RLE Weight Bearing: Non weight bearing   General Chart Reviewed: Yes Family/Caregiver Present: Yes (daughter from [place])    Home Living/Prior [place] Available Help at Discharge: Family, Available 24 hours/day Type of Home: House Home Access: Stairs to enter [employer] of Steps: 4 from garage Entrance Stairs-Rails: None Home Layout: Two level, Bed/bath upstairs, 1/2 bath on main level Alternate Level Stairs-Number of Steps: 15 then landing then 6 more Alternate Level Stairs-Rails: Left Bathroom Shower/Tub: Walk-in shower (no door or curtain, small threshold (~2-3 inches tall)) Bathroom Toilet: Standard  Lives With: Spouse IADL History Homemaking Responsibilities: Yes Meal Prep Responsibility: Primary Laundry Responsibility: Secondary (laundry on main floor) Cleaning Responsibility: No Shopping Responsibility: Primary Current License: Yes Mode of Transportation: Car (pt driving PTA) Occupation: Full time employment Type of Occupation: Land Leisure and Hobbies: golfing, boating, house at [place] with gait  Able to [place]?: Yes Driving: Yes Vocation: Full time employment 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 Vision/History: Wears glasses Wears Glasses: Distance only Patient Visual Report: No change from baseline   Cognition Overall Cognitive Status: Within Functional  Limits for tasks assessed Arousal/Alertness: Awake/alert   Sensation Sensation Light Touch: Appears Intact Coordination Gross Motor Movements are Fluid and Coordinated: Yes Fine Motor Movements are Fluid and Coordinated: Yes   Motor  Motor Motor: Within Functional Limits   Mobility  Bed Mobility Bed Mobility: Supine to Sit;Sit to Supine Supine to Sit: 4: Min guard Sit to Supine:  4: Min guard Transfers Sit to Stand: 3: Mod assist Sit to Stand Details: Verbal cues for sequencing;Verbal cues for technique;Verbal cues for precautions/safety;Verbal cues for safe use of DME/AE;Manual facilitation for weight shifting Stand to Sit: 4: Min guard   Trunk/Postural Assessment  Cervical Assessment Cervical Assessment: Within Functional Limits Thoracic Assessment Thoracic Assessment: Within Functional Limits Lumbar Assessment Lumbar Assessment: Within Functional Limits Postural Control Postural Control: Within Functional Limits   Balance Balance Balance Assessed: Yes Dynamic Sitting Balance Dynamic Sitting - Balance Support: Left upper extremity supported;Right upper extremity supported;During functional activity Dynamic Sitting - Level of Assistance: 4: Min assist Sitting balance - Comments: LB dressing Static Standing Balance Static Standing - Balance Support: Bilateral upper extremity supported Static Standing - Level of Assistance: 5: Stand by assistance Dynamic Standing Balance Dynamic Standing - Balance Support: Right upper extremity supported;Left upper extremity supported;During functional activity Dynamic Standing - Level of Assistance: 4: Min assist   Extremity/Trunk Assessment RUE Assessment RUE Assessment: Within Functional Limits LUE Assessment LUE Assessment: Within Functional Limits   See Function Navigator for Current Functional Status.   Refer to Care Plan for Long Term Goals  Recommendations for other services: Neuropsych  Discharge Criteria: Patient  will be discharged from OT if patient refuses treatment 3 consecutive times without medical reason, if treatment goals not met, if there is a change in medical status, if patient makes no progress towards goals or if patient is discharged from hospital.  The above assessment, treatment plan, treatment alternatives and goals were discussed and mutually agreed upon: by patient and by family (daughter)  SESSION NOTES  Session #1 [phone] - 60 Minutes  Individual Therapy No complaints of pain  Initial 1:1 OT evaluation completed. Focused skilled intervention on functional toilet transfer using grab bars and 3-n-1 over toilet seat in actual bathroom, education on sensation/pain after an amputation, compression wrapping, correct positioning for right residual limb, and discussed d/c planning/goals/ELOS. Pt dressed prior to this OT entering room, will complete ADL next morning/ADL OT session. At end of session, left patient seated in w/c with all needs within reach and daughter present. Pt aware not to get up out of w/c without staff present and assisting.    Session #2 [id number] - 75 Minutes Individual Therapy Patient with 7/10 complaints of pain in right residual limb with activity, RN aware Upon entering room, pt asleep in bed. Pt easy to awake and arouse. Therapist administered inspection mirror for right residual limb. Therapist then had patient engage in rolling for peri cleansing. Attempted using inspection mirror to look at skin breakdown on sacrum, due to decreased neck ROM pt unable to do so. Therapist took picture of wound with patient's phone, with patient's permission. Therapist explained importance of patient keeping an eye on his buttock wound and making sure wife does as well. Took opportunity to explain importance of pressure relief, every 2 hours in bed and every 20-30 minutes in w/c. Explained w/c cushion to patient and daughter and explained need to re-distribute gel prior to getting  into w/c every time. Pt engaged in bed mobility and transferred EOB to w/c, squat pivot technique. Pt then sat in w/c and worked on different pressure relief techniques, lateral leans and w/c pushups. Pt sat in w/c at sink to complete grooming tasks of shaving and washing face. From here, pt propelled self to therapy gym and daughter practiced un-wrapping and wrapping right residual limb X2. Administered educational handout on this AND administered educational handout on pain management for  residual limb.  Administered blue theraband and educated patient on 4 exercises to perform with this, would like to add on to these exercises as patient progresses. Pt propelled self back to room. Left pt seated in w/c with [employer] rep and daughter present.   Chrys Racer , MS, OTR/L, CLT Pager: [phone]  12/21/2015, 12:36 PM

## 2015-12-21 NOTE — IPOC Note (Signed)
Overall Plan of Care [facility]) Patient Details Name: Adam [name] MRN: [id number] DOB: 1951-07-01  Admitting Diagnosis: RT BKA  Hospital Problems: Active Problems:   Status post below knee amputation of right lower extremity (HCC)   Hyponatremia   Leukocytosis   Transaminitis   Chronic pain syndrome     Functional Problem List: Nursing Bowel, Edema, Endurance, Medication Management, Pain, Safety, Sensory, Skin Integrity  PT Balance, Endurance, Motor, Pain, Safety  OT Balance, Edema, Endurance, Pain, Safety, Sensory, Skin Integrity  SLP    TR         Basic ADL's: OT Grooming, Bathing, Toileting     Advanced  ADL's: OT Simple Meal Preparation     Transfers: PT Bed Mobility, Bed to Chair, Car, Manufacturing systems engineer, Metallurgist: PT Stairs, Emergency planning/management officer, Ambulation     Additional Impairments: OT Other (comment) (strengthening of BUE)  SLP        TR      Anticipated Outcomes Item Anticipated Outcome  Self Feeding independent  Swallowing      Basic self-care  mod I  Toileting  mod I   Bathroom Transfers mod I  Bowel/Bladder  manage bowel and bladder mod I  Transfers  mod I  Locomotion  mod I w/c and short distance gait, supervision stairs  Communication     Cognition     Pain  6 or less  Safety/Judgment  Mod I   Therapy Plan: PT Intensity: Minimum of 1-2 x/day ,45 to 90 minutes PT Frequency: 5 out of 7 days PT Duration Estimated Length of Stay: 7-10 OT Intensity: Minimum of 1-2 x/day, 45 to 90 minutes OT Frequency: 5 out of 7 days OT Duration/Estimated Length of Stay: 7-10 days         Team Interventions: Nursing Interventions Patient/Family Education, Bowel Management, Disease Management/Prevention, Pain Management, Medication Management, Skin Care/Wound Management  PT interventions Ambulation/gait training, DME/adaptive equipment instruction, Neuromuscular re-education, Community reintegration, IT trainer, UE/LE  Strength taining/ROM, Curator, Training and development officer, Discharge planning, Therapeutic Activities, UE/LE Coordination activities, Functional mobility training, Patient/family education, Therapeutic Exercise  OT Interventions Training and development officer, Community reintegration, Discharge planning, Functional mobility training, Pain management, Patient/family education, Self Care/advanced ADL retraining, Skin care/wound managment, Therapeutic Activities, Therapeutic Exercise, Wheelchair propulsion/positioning, Disease mangement/prevention, DME/adaptive equipment instruction, Psychosocial support, UE/LE Strength taining/ROM  SLP Interventions    TR Interventions    SW/CM Interventions Discharge Planning, Psychosocial Support, Patient/Family Education    Team Discharge Planning: Destination: PT-Home ,OT- Home , SLP-  Projected Follow-up: PT-Home health PT, OT-  Home health OT (vs none), SLP-  Projected Equipment Needs: PT-Wheelchair (measurements), OT- 3 in 1 bedside comode, Tub/shower bench (potentially a tub bench for his walk in shower, this is still TBD), SLP-  Equipment Details: PT-18x18 w/c with R amputee support pad and basic cushion , OT-  Patient/family involved in discharge planning: PT- Patient, Family member/caregiver,  OT-Patient, Family member/caregiver, SLP-   MD ELOS:  7-10 days. Medical Rehab Prognosis:  Excellent Assessment: 65 y.o. right handed male with history of remote tobacco abuse, hypertension, diabetes mellitus and peripheral neuropathy, peripheral vascular disease status post right femoral-popliteal artery bypass 11/28/2015 and gangrenous right great toe status post right great toe amputation 12/01/2015 and chronic pain maintained on oxycodone 15 mg every 4 hours as needed. Independent prior to admission working full time up until latest femoral bypass surgery. Presented 12/15/2015 with severe right foot pain and poor healing of recent right  great toe amputation  with profound ischemic changes. Limb was not felt to be salvageable and underwent right below-knee amputation 12/16/2015 per Dr. Donnetta Hutching. Hospital course complicated by pain management with pharmacy consulted for pain control and wean from PCA placed on Oxycodone 30mg  every 4 hour,neurontin and fentanyl patch.   See Team Conference Notes for weekly updates to the plan of care

## 2015-12-22 ENCOUNTER — Inpatient Hospital Stay (HOSPITAL_COMMUNITY): Payer: Medicare Other | Admitting: Occupational Therapy

## 2015-12-22 DIAGNOSIS — G894 Chronic pain syndrome: Secondary | ICD-10-CM

## 2015-12-22 DIAGNOSIS — Z89511 Acquired absence of right leg below knee: Secondary | ICD-10-CM

## 2015-12-22 DIAGNOSIS — R740 Nonspecific elevation of levels of transaminase and lactic acid dehydrogenase [LDH]: Secondary | ICD-10-CM

## 2015-12-22 LAB — GLUCOSE, CAPILLARY
GLUCOSE-CAPILLARY: 99 mg/dL (ref 65–99)
GLUCOSE-CAPILLARY: 232 mg/dL — AB (ref 65–99)
GLUCOSE-CAPILLARY: 96 mg/dL (ref 65–99)

## 2015-12-22 MED ORDER — MUPIROCIN CALCIUM 2 % EX CREA
TOPICAL_CREAM | Freq: Two times a day (BID) | CUTANEOUS | Status: DC
  Administered 2015-12-22: 1 via TOPICAL
  Administered 2015-12-22: 13:00:00 via TOPICAL
  Administered 2015-12-23: 1 via TOPICAL
  Administered 2015-12-23 – 2015-12-25 (×4): via TOPICAL
  Administered 2015-12-25: 1 via TOPICAL
  Administered 2015-12-26: 08:00:00 via TOPICAL
  Filled 2015-12-22 (×2): qty 15

## 2015-12-22 NOTE — Progress Notes (Signed)
[place] PHYSICAL MEDICINE & REHABILITATION     PROGRESS NOTE  Subjective/Complaints:     Rash on face and buttocks, not painful ROS: Denies CP, SOB, n/v/d, phantom pain.   Objective: Vital Signs: Blood pressure 130/65, pulse 81, temperature 98.4 F (36.9 C), temperature source Oral, resp. rate 18, weight 80.604 kg (177 lb 11.2 oz), SpO2 94 %. No results found.  Recent Labs  12/20/15 1608 12/21/15 0530  WBC 12.5* 12.0*  HGB 12.1* 11.3*  HCT 35.4* 32.9*  PLT 316 307    Recent Labs  12/20/15 1608 12/21/15 0530  NA  --  133*  K  --  4.4  CL  --  93*  GLUCOSE  --  117*  BUN  --  14  CREATININE 0.88 0.78  CALCIUM  --  8.9   CBG (last 3)   Recent Labs  12/21/15 1152 12/21/15 1638 12/22/15 0651  GLUCAP 103* 107* 99    Wt Readings from Last 3 Encounters:  12/20/15 80.604 kg (177 lb 11.2 oz)  12/15/15 81.4 kg (179 lb 7.3 oz)  12/12/15 82.555 kg (182 lb)    Physical Exam:  BP 130/65 mmHg  Pulse 81  Temp(Src) 98.4 F (36.9 C) (Oral)  Resp 18  Wt 80.604 kg (177 lb 11.2 oz)  SpO2 94% Constitutional: He appears well-developed and well-nourished. NAD.  HENT: Normocephalic and atraumatic.  Eyes: Conjunctivae and EOM are normal.  Cardiovascular: Normal rate, regular rhythm. Murmur heard. Respiratory: Effort normal and breath sounds normal. No respiratory distress. GI: Soft. Bowel sounds are normal. He exhibits no distension.  Musculoskeletal: He exhibits mild edema (RLE) and tenderness (RLE).  Neurological: He is alert and oriented.  Motor: B/l UE, LLE 5/5 RLE: hip flexion 3+/5 (pain inhibition)  LLE: 5/5 proximal to distal Skin: Skin is warm and dry.  Stump c/d/i with moderate ecchymosis. Psychiatric: He has a normal mood and affect. His behavior is normal  Assessment/Plan: 1. Functional deficits secondary toRight BKA,  PVD with profound ischemic changes which require 3+ hours per day of interdisciplinary therapy in a comprehensive inpatient rehab  setting. Physiatrist is providing close team supervision and 24 hour management of active medical problems listed below. Physiatrist and rehab team continue to assess barriers to discharge/monitor patient progress toward functional and medical goals.  Function:  Bathing Bathing position Bathing activity did not occur:  (did not occur during OT eval, will complete next session)    Bathing parts      Bathing assist        Upper Body Dressing/Undressing Upper body dressing Upper body dressing/undressing activity did not occur:  (did not occur during OT eval, will complete next session) What is the patient wearing?: Pull over shirt/dress     Pull over shirt/dress - Perfomed by patient: Thread/unthread left sleeve, Put head through opening, Pull shirt over trunk, Thread/unthread right sleeve          Upper body assist Assist Level: Set up   Set up : To obtain clothing/put away  Lower Body Dressing/Undressing Lower body dressing Lower body dressing/undressing activity did not occur:  (Did not occur during OT eval, will complete next session. Pt is able to reach LLE for LB ADLs, however he did loose balance to left when attempting to don sock. ) What is the patient wearing?: Underwear, Pants Underwear - Performed by patient: Thread/unthread left underwear leg, Pull underwear up/down Underwear - Performed by helper: Thread/unthread right underwear leg, Pull underwear up/down Pants- Performed by patient: Thread/unthread  right pants leg, Thread/unthread left pants leg, Fasten/unfasten pants, Pull pants up/down Pants- Performed by helper: Pull pants up/down                      Lower body assist        Toileting Toileting Toileting activity did not occur:  (did not occur)        Toileting assist     Transfers Chair/bed transfer   Chair/bed transfer method: Stand pivot, Squat pivot Chair/bed transfer assist level: Moderate assist (Pt 50 - 74%/lift or lower) Chair/bed  transfer assistive device: Armrests, Medical sales representative     Max distance: 50 Assist level: Touching or steadying assistance (Pt > 75%)   Wheelchair   Type: Manual Max wheelchair distance: 150 Assist Level: Supervision or verbal cues  Cognition Comprehension Comprehension assist level: Follows complex conversation/direction with extra time/assistive device  Expression Expression assist level: Expresses complex ideas: With extra time/assistive device  Social Interaction Social Interaction assist level: Interacts appropriately with others - No medications needed.  Problem Solving Problem solving assist level: Solves complex problems: With extra time  Memory Memory assist level: Recognizes or recalls 90% of the time/requires cueing < 10% of the time    Medical Problem List and Plan: 1. Right BKA 12/16/2015 secondary to secondary to PVD with profound ischemic changes Clean amputation daily with soap and water Monitor incision site for signs of infection or impending skin breakdown. Staples to remain in place for 3-4 weeks Stump shrinker, for edema control  Scar mobilization massaging to prevent soft tissue adherence Stump protector during therapies Prevent flexion contractures by implementing the following:   Encourage prone lying for 20-30 mins per day BID to avoid hip flexion Contractures   Avoid pillow under knees when patient is lying in bed in order to prevent both knee and hip flexion contractures;  Avoid prolonged sitting Post surgical pain control with oral medication Phantom limb pain control with physical modalities including desensitization techniques (gentle self massage to the residual stump,hot packs if sensation iintact, [place]) and mirror therapy, TENS. If ineffective, consider pharmacological treatment for neuropathic pain (e.g gabapentin, pregabalin, amytriptalyine, duloxetine).  When using wheelchair, patient should have knee on amputated side fully  extended with board under the seat cushion. Avoid injury to contralateral side   2. DVT Prophylaxis/Anticoagulation: Subcutaneous Lovenox. Monitor platelet counts and any signs of bleeding 3. Pain Management: Celebrex 200 mg twice a day, fentanyl patch 50 g, Neurontin 300 mg twice a day,Oxycodone 30mg  every 4 hours,robaxin as needed. Monitor mental status closely and taper as tolerated in a few days. 4. Hypertension. Hydrochlorothiazide 12.5 mg daily, lisinopril 20 mg daily. Monitor with increased mobility 5. Neuropsych: This patient is capable of making decisions on his own behalf. 6. Skin/Wound Care: Routine skin checks, MAST- zinc/barrier cream to buttocks 7. Fluids/Electrolytes/Nutrition: Routine I&O   Mild Hyponatremia: 133 on 2/3 (stable) - Will cont to monitor  labs for Monday 8. Diabetes mellitus of peripheral neuropathy. Hemoglobin A1c 5.7. Check blood sugar before meals and at bedtime. Sliding scale insulin. Patient diet controlled prior to admission  Fasting CBG 112 this AM 9. Hyperlipidemia. Lipitor 10. Leukocytosis:  12.0 on 2/3 (stable)  Will order labs for Monday 11. ABLA  Hb 11.3 on 2/3   labs for Monday 12. Transaminitis - may be statin related   labs for Monday 13.  Superficial skin infx looks like staph/strp on face and buttocks- mucpirocin LOS (Days) 2 A  FACE TO FACE EVALUATION WAS PERFORMED  Charlett Blake 12/22/2015 7:43 AM

## 2015-12-22 NOTE — Progress Notes (Signed)
Ankit Lorie Phenix, MD Physician Signed Physical Medicine and Rehabilitation Consult Note 12/17/2015 6:33 AM  Related encounter: ED to Hosp-Admission (Discharged) from 12/15/2015 in [place] Collapse All        Physical Medicine and Rehabilitation Consult Reason for Consult: Right BKA Referring Physician: Dr. Donnetta Hutching   HPI: Adam [name] is a 65 y.o. right handed male with history of remote tobacco abuse, hypertension, diabetes mellitus and peripheral neuropathy, peripheral vascular disease status post right femoral-popliteal artery bypass 11/28/2015 and gangrenous right great toe status post right great toe amputation 12/01/2015. Patient lives with his wife. Independent prior to admission working full time up until latest femoral bypass surgery. 2 level home with 2 steps to entry and bedroom upstairs. Wife can assist as needed. Presented 12/15/2015 with severe right foot pain and poor healing of recent right great toe amputation with profound ischemic changes. Limb was not felt to be salvageable and underwent right below-knee amputation 12/16/2015 per Dr. Donnetta Hutching. Hospital course pain management. Subcutaneous Lovenox for DVT prophylaxis. Physical and occupational therapy evaluations pending. M.D. has requested physical medicine rehabilitation consult.   Review of Systems  Constitutional: Negative for fever and chills.  HENT: Negative for hearing loss.  Eyes: Negative for blurred vision and double vision.  Respiratory: Negative for cough and shortness of breath.  Cardiovascular: Positive for leg swelling. Negative for chest pain and palpitations.  Gastrointestinal: Positive for constipation. Negative for nausea and vomiting.  Genitourinary: Negative for dysuria and hematuria.  Musculoskeletal: Positive for myalgias and joint pain.   Severe right foot pain  Skin: Negative for rash.  Neurological: Negative for seizures, loss of  consciousness and headaches.  All other systems reviewed and are negative.  Past Medical History  Diagnosis Date  . Diabetes mellitus without complication ([place])   . Hypertension   . Peripheral vascular disease ([place])   . Gangrene ([place]) 11/22/2015    right great toe  . Sleep apnea     uses cpap   Past Surgical History  Procedure Laterality Date  . Peripheral vascular catheterization N/A 11/26/2015    Procedure: Abdominal Aortogram; Surgeon: [name], MD; Location: [facility]; Service: Cardiovascular; Laterality: N/A;  . Hernia repair Bilateral 2000    inguinal  . Vasectomy    . Colonoscopy    . Femoral-popliteal bypass graft Right 11/28/2015    Procedure: RIGHT FEMORAL-POPLITEAL ARTERY BYPASS GRAFT AND CONSTRUCTION OF MILLER CUFF USING 6 MM X 80 CM PROPATEN GRAFT ; Surgeon: Conrad Fountain, MD; Location: [facility]; Service: Vascular; Laterality: Right;  . Amputation Right 12/01/2015    Procedure: AMPUTATION RIGHT GREAT; Surgeon: Conrad Gloverville, MD; Location: [facility]; Service: Vascular; Laterality: Right;   Family History  Problem Relation Age of Onset  . Hypertension Mother    Social History:  reports that he quit smoking about 4 years ago. He has never used smokeless tobacco. He reports that he drinks about 4.2 oz of alcohol per week. He reports that he does not use illicit drugs. Allergies: No Known Allergies Medications Prior to Admission  Medication Sig Dispense Refill  . amoxicillin-clavulanate (AUGMENTIN) 875-125 MG tablet Take 1 tablet by mouth 2 (two) times daily. 20 tablet 0  . aspirin 81 MG tablet Take 81 mg by mouth daily.    Marland Kitchen atorvastatin (LIPITOR) 10 MG tablet Take 1 tablet (10 mg total) by mouth daily. (Patient taking differently: Take 10 mg by mouth every other day. )  90 tablet 3  . docusate sodium (COLACE) 100 MG capsule Take 100 mg by mouth 2 (two) times  daily as needed for mild constipation.    Marland Kitchen lisinopril-hydrochlorothiazide (PRINZIDE,ZESTORETIC) 20-12.5 MG per tablet Take 1 tablet by mouth daily. 90 tablet 3  . Multiple Vitamin (MULTIVITAMIN) tablet Take 1 tablet by mouth daily.    Marland Kitchen oxyCODONE 10 MG TABS Take 1 tablet (10 mg total) by mouth every 4 (four) hours as needed for moderate pain. (Patient taking differently: Take 15 mg by mouth every 4 (four) hours as needed for moderate pain. ) 50 tablet 0  . oxyCODONE-acetaminophen (PERCOCET) 10-325 MG tablet Take 1 tablet by mouth every 4 (four) hours as needed for pain. 30 tablet 0  . oxyCODONE-acetaminophen (PERCOCET/ROXICET) 5-325 MG tablet Take 1-2 tablets by mouth every 6 (six) hours as needed for moderate pain. 30 tablet 0  . Blood Glucose Monitoring Suppl (ONE TOUCH ULTRA MINI) w/Device KIT     . ONE TOUCH ULTRA TEST test strip     . ONETOUCH DELICA LANCETS 16X MISC     . traMADol (ULTRAM) 50 MG tablet Take 1 tablet (50 mg total) by mouth every 8 (eight) hours as needed. (Patient not taking: Reported on 12/15/2015) 30 tablet 0    Home: Home Living Family/patient expects to be discharged to:: Private residence Living Arrangements: Spouse/significant other  Functional History:   Functional Status:  Mobility:          ADL:    Cognition: Cognition Orientation Level: Oriented X4    Blood pressure 176/77, pulse 74, temperature 99.5 F (37.5 C), temperature source Oral, resp. rate 18, height _0  (1.803 m), weight 81.4 kg (179 lb 7.3 oz), SpO2 99 %. Physical Exam  Vitals reviewed. Constitutional: He is oriented to person, place, and time. He appears well-developed and well-nourished.  HENT:  Head: Normocephalic and atraumatic.  Eyes: Conjunctivae and EOM are normal.  Neck: Normal range of motion. Neck supple. No thyromegaly present.  Cardiovascular: Normal rate, regular rhythm and intact distal pulses.  Murmur  heard. Respiratory: Effort normal and breath sounds normal. No respiratory distress.  GI: Soft. Bowel sounds are normal. He exhibits no distension.  Musculoskeletal: He exhibits edema (RLE) and tenderness (RLE).  Neurological: He is alert and oriented to person, place, and time.  Sensation intact to light touch Motor: B/l UE, LLE 5/5 RLE: hip flexion 3-/5 (pain inhibition)  Skin: Skin is warm and dry.  Right BKA site is dressed appropriately tender  Psychiatric: He has a normal mood and affect. His behavior is normal.     Lab Results Last 24 Hours    Results for orders placed or performed during the hospital encounter of 12/15/15 (from the past 24 hour(s))  Glucose, capillary Status: None   Collection Time: 12/16/15 9:12 AM  Result Value Ref Range   Glucose-Capillary 96 65 - 99 mg/dL   Comment 1 Notify RN    Comment 2 Document in Chart   Glucose, capillary Status: Abnormal   Collection Time: 12/16/15 11:26 AM  Result Value Ref Range   Glucose-Capillary 120 (H) 65 - 99 mg/dL  Glucose, capillary Status: Abnormal   Collection Time: 12/16/15 4:30 PM  Result Value Ref Range   Glucose-Capillary 134 (H) 65 - 99 mg/dL  Glucose, capillary Status: Abnormal   Collection Time: 12/16/15 9:10 PM  Result Value Ref Range   Glucose-Capillary 118 (H) 65 - 99 mg/dL   Comment 1 Notify RN    Comment 2 Document  in Chart   Basic metabolic panel Status: Abnormal   Collection Time: 12/17/15 4:30 AM  Result Value Ref Range   Sodium 137 135 - 145 mmol/L   Potassium 3.9 3.5 - 5.1 mmol/L   Chloride 98 (L) 101 - 111 mmol/L   CO2 30 22 - 32 mmol/L   Glucose, Bld 138 (H) 65 - 99 mg/dL   BUN 9 6 - 20 mg/dL   Creatinine, Ser 0.72 0.61 - 1.24 mg/dL   Calcium 9.3 8.9 - 10.3 mg/dL   GFR calc non Af Amer >60 >60 mL/min   GFR calc Af Amer >60 >60 mL/min   Anion gap 9 5 - 15   CBC Status: Abnormal   Collection Time: 12/17/15 4:30 AM  Result Value Ref Range   WBC 11.8 (H) 4.0 - 10.5 K/uL   RBC 4.09 (L) 4.22 - 5.81 MIL/uL   Hemoglobin 12.6 (L) 13.0 - 17.0 g/dL   HCT 38.2 (L) 39.0 - 52.0 %   MCV 93.4 78.0 - 100.0 fL   MCH 30.8 26.0 - 34.0 pg   MCHC 33.0 30.0 - 36.0 g/dL   RDW 12.2 11.5 - 15.5 %   Platelets 327 150 - 400 K/uL      Imaging Results (Last 48 hours)    No results found.    Assessment/Plan: Diagnosis: Right BKA Labs independently reviewed. Records reviewed and summated above. PT/OT for mobility, ADL's, strengthening,and wheelchair training Clean amputation daily with soap and water Monitor incision site for signs of infection or impending skin breakdown. Staples to remain in place for 3-4 weeks Stump shrinker, for edema control  Scar mobilization massaging to prevent soft tissue adherence Stump protector during therapies Prevent flexion contractures by implementing the following:  Encourage prone lying for 20-30 mins per day BID to avoid hip flexion Contractures if medically appropriate; Avoid pillow under knees when patient is lying in bed in order to prevent both knee and hip flexion contractures; Avoid prolonged sitting Post surgical pain control with oral medication Phantom limb pain control with physical modalities including desensitization techniques (gentle self massage to the residual stump,hot packs if sensation iintact, [place]) and mirror therapy, TENS. If ineffective, consider pharmacological treatment for neuropathic pain (e.g gabapentin, pregabalin, amytriptalyine, duloxetine).  When using wheelchair, patient should have knee on amputated side fully extended with board under the seat cushion. Avoid injury to contralateral side  1. Does the need for close, 24 hr/day medical supervision in concert with the patient's rehab  needs make it unreasonable for this patient to be served in a less intensive setting? Yes 2. Co-Morbidities requiring supervision/potential complications: diabetes mellitus and peripheral neuropathy (Monitor in accordance with exercise and adjust meds as necessary), peripheral vascular disease status post right femoral-popliteal artery bypass and right great toe amputation, HTN (monitor and provide prns in accordance with increased physical exertion and pain), SIRS (tachypnea, fevers, leukocytosis - monitor for signs/symptoms of infection and consider additional workup if necessary), post-op pain (Biofeedback training with therapies to help reduce reliance on opiate pain medications, monitor pain control during therapies, and sedation at rest and titrate to maximum efficacy to ensure participation and gains in therapies), ABLA (transfuse if necessary to ensure appropriate perfusion for increased activity tolerance) 3. Due to safety, skin/wound care, disease management, pain management and patient education, does the patient require 24 hr/day rehab nursing? Yes 4. Does the patient require coordinated care of a physician, rehab nurse, PT (1-2 hrs/day, 5 days/week) and OT (1-2 hrs/day, 5 days/week) to address  physical and functional deficits in the context of the above medical diagnosis(es)? Yes Addressing deficits in the following areas: balance, endurance, locomotion, strength, transferring, bathing, dressing, toileting and psychosocial support 5. Can the patient actively participate in an intensive therapy program of at least 3 hrs of therapy per day at least 5 days per week? Potentially 6. The potential for patient to make measurable gains while on inpatient rehab is excellent 7. Anticipated functional outcomes upon discharge from inpatient rehab are TBD with PT, TBD with OT, n/a with SLP. 8. Estimated rehab length of stay to reach the above functional goals is: TBD 9. Does the patient have adequate  social supports and living environment to accommodate these discharge functional goals? Yes 10. Anticipated D/C setting: Home 11. Anticipated post D/C treatments: HH therapy and Home excercise program 12. Overall Rehab/Functional Prognosis: excellent  RECOMMENDATIONS: This patient's condition is appropriate for continued rehabilitative care in the following setting: Likely CIR, however, will need formal therapy consults and medical stability (improvement in SIRS and pain) Patient has agreed to participate in recommended program. Yes Note that insurance prior authorization may be required for reimbursement for recommended care.  Comment: Rehab Admissions Coordinator to follow up.  Delice Lesch, MD 12/17/2015       Revision History     Date/Time User Provider Type Action   12/17/2015 10:12 AM Ankit Lorie Phenix, MD Physician Sign   12/17/2015 6:51 AM Cathlyn Parsons, PA-C Physician Assistant Pend   View Details Report       Routing History     Date/Time From To Method   12/17/2015 10:12 AM Ankit Lorie Phenix, MD Ankit Lorie Phenix, MD In [facility]   12/17/2015 10:12 AM Ankit Lorie Phenix, MD Denita Lung, MD In [facility]

## 2015-12-22 NOTE — Progress Notes (Signed)
Retta Diones, RN Rehab Admission Coordinator Signed Physical Medicine and Rehabilitation PMR Pre-admission 12/20/2015 12:43 PM  Related encounter: ED to Hosp-Admission (Discharged) from 12/15/2015 in [facility]   PMR Admission Coordinator Pre-Admission Assessment  Patient: Adam [name] is an 65 y.o., male MRN: [id number] DOB: November 10, 1951 Height: 5' 11" (180.3 cm) Weight: 81.4 kg (179 lb 7.3 oz)  Insurance Information HMO: PPO: Yes PCP: IPA: 80/20: OTHER: Group # [id number] PRIMARY: All [employer] [facility] Policy#: [id number] Subscriber: [name] CM Name: [name]. Phone#: [phone] Fax#: [phone] Pre-Cert#: [id number] X 6 days with update on the 7th day. Update on 12/26/15 Employer: [employer] Benefits: Phone #: [phone] Name: [name]. Date: 07/18/13 Deduct: $2000 (met $0) Out of Pocket Max: $4000 (met $229.01) Life Max: unlimited CIR: 100% after ded met SNF: 100% after ded met and 60 days max Outpatient: 100% after ded and 30 visits combined Co-Pay: none Home Health: 100% after ded and 30 visits Co-Pay: none DME: 100% after ded met Co-Pay: none Providers: in network  Emergency Contact Information Contact Information    Name Relation Home Work [place] [phone]       Current Medical History  Patient Admitting Diagnosis: R BKA  History of Present Illness: A 65 y.o. right handed male with history of remote tobacco abuse, hypertension, diabetes mellitus and peripheral neuropathy, peripheral vascular disease status post right femoral-popliteal artery bypass 11/28/2015 and gangrenous right great toe status post right great toe amputation 12/01/2015 and  chronic pain maintained on oxycodone 15 mg every 4 hours as needed. Patient lives with his wife. Independent prior to admission working full time up until latest femoral bypass surgery. 2 level home with 2 steps to entry and bedroom upstairs. Wife can assist as needed. Presented 12/15/2015 with severe right foot pain and poor healing of recent right great toe amputation with profound ischemic changes. Limb was not felt to be salvageable and underwent right below-knee amputation 12/16/2015 per Dr. Donnetta Hutching. Hospital course pain management with pharmacy consulted for pain control and wean from PCA placed on Oxycodone 63m every 4 hour,neurontin and fentanyl patch. Subcutaneous Lovenox for DVT prophylaxis. Physical and occupational therapy evaluations completed with recommendations of physical medicine rehabilitation consult. Patient to be admitted for a comprehensive inpatient rehabilitation program.   Past Medical History  Past Medical History  Diagnosis Date  . [place]   . Hypertension   . Peripheral vascular disease ([facility]   . Gangrene ([place] 11/22/2015    right great toe  . Sleep apnea     uses cpap    Family History  family history includes Hypertension in his mother.  Prior Rehab/Hospitalizations: No previous rehab admissions. Did have bypass surgery and a toe amputation in early January 2017.  Has the patient had major surgery during 100 days prior to admission? Yes. Had bypass surgery 11/24/15 per wife and toe amputation 12/01/15 per wife.  Current Medications   Current facility-administered medications:  . acetaminophen (TYLENOL) tablet 325-650 mg, 325-650 mg, Oral, Q4H PRN, 650 mg at 12/19/15 2036 **OR** acetaminophen (TYLENOL) suppository 325-650 mg, 325-650 mg, Rectal, Q4H PRN, Samantha J Rhyne, PA-C . alum & mag hydroxide-simeth (MAALOX/MYLANTA) 200-200-20 MG/5ML suspension 15-30 mL, 15-30 mL, Oral, Q2H PRN, Samantha J  Rhyne, PA-C . aspirin chewable tablet 81 mg, 81 mg, Oral, Daily, Samantha J Rhyne, PA-C, 81 mg at 12/20/15 1230 . atorvastatin (LIPITOR) tablet 10 mg,  10 mg, Oral, QODAY, Samantha J Rhyne, PA-C, 10 mg at 12/20/15 1230 . celecoxib (CELEBREX) capsule 200 mg, 200 mg, Oral, BID, Karren Cobble, RPH, 200 mg at 12/20/15 1231 . cyclobenzaprine (FLEXERIL) tablet 10 mg, 10 mg, Oral, TID PRN, [name], MD, 10 mg at 12/19/15 2141 . dextrose 5 %-0.45 % sodium chloride infusion, , Intravenous, Continuous, [name], MD, Last Rate: 75 mL/hr at 12/19/15 1456 . diazepam (VALIUM) tablet 2 mg, 2 mg, Oral, Q6H PRN, Samantha J Rhyne, PA-C . docusate sodium (COLACE) capsule 100 mg, 100 mg, Oral, BID PRN, Samantha J Rhyne, PA-C . docusate sodium (COLACE) capsule 100 mg, 100 mg, Oral, BID, Karren Cobble, RPH, 100 mg at 12/20/15 1229 . enoxaparin (LOVENOX) injection 30 mg, 30 mg, Subcutaneous, Q24H, Samantha J Rhyne, PA-C, 30 mg at 12/20/15 1232 . fentaNYL (DURAGESIC - dosed mcg/hr) 50 mcg, 50 mcg, Transdermal, Q72H, Karren Cobble, RPH, 50 mcg at 12/19/15 1456 . fentaNYL (SUBLIMAZE) injection 25-50 mcg, 25-50 mcg, Intravenous, Q2H PRN, Karren Cobble, RPH, 50 mcg at 12/20/15 0506 . gabapentin (NEURONTIN) capsule 300 mg, 300 mg, Oral, BID, Karren Cobble, RPH, 300 mg at 12/20/15 1103 . guaiFENesin-dextromethorphan (ROBITUSSIN DM) 100-10 MG/5ML syrup 15 mL, 15 mL, Oral, Q4H PRN, Samantha J Rhyne, PA-C . hydrALAZINE (APRESOLINE) injection 5 mg, 5 mg, Intravenous, Q20 Min PRN, Samantha J Rhyne, PA-C . hydrochlorothiazide (MICROZIDE) capsule 12.5 mg, 12.5 mg, Oral, Daily, Arvilla Meres Early, MD, 12.5 mg at 12/20/15 1103 . insulin aspart (novoLOG) injection 0-15 Units, 0-15 Units, Subcutaneous, TID WC, Samantha J Rhyne, PA-C, 0 Units at 12/17/15 0700 . labetalol (NORMODYNE,TRANDATE) injection 10 mg, 10 mg, Intravenous, Q10 min PRN, Samantha J Rhyne, PA-C . lisinopril  (PRINIVIL,ZESTRIL) tablet 20 mg, 20 mg, Oral, Daily, [name], MD, 20 mg at 12/20/15 1103 . metoprolol (LOPRESSOR) injection 2-5 mg, 2-5 mg, Intravenous, Q2H PRN, Samantha J Rhyne, PA-C . multivitamin with minerals tablet 1 tablet, 1 tablet, Oral, Daily, Samantha J Rhyne, PA-C, 1 tablet at 12/20/15 1231 . oxyCODONE (Oxy IR/ROXICODONE) immediate release tablet 30 mg, 30 mg, Oral, Q4H, Crystal Trellis Moment, RPH, 30 mg at 12/20/15 1102 . pantoprazole (PROTONIX) EC tablet 40 mg, 40 mg, Oral, Daily, Samantha J Rhyne, PA-C, 40 mg at 12/20/15 1232 . phenol (CHLORASEPTIC) mouth spray 1 spray, 1 spray, Mouth/Throat, PRN, Samantha J Rhyne, PA-C . potassium chloride SA (K-DUR,KLOR-CON) CR tablet 20-40 mEq, 20-40 mEq, Oral, Daily PRN, Hulen Shouts Rhyne, PA-C  Patients Current Diet: Diet Carb Modified Fluid consistency:: Thin; Room service appropriate?: Yes  Precautions / Restrictions Precautions Precautions: Fall Restrictions Weight Bearing Restrictions: Yes RLE Weight Bearing: Non weight bearing (BKA)   Has the patient had 2 or more falls or a fall with injury in the past year?No  Prior Activity Level Community (5-7x/wk): Went out most days prior to toe amputation and surgery in early January 2017.  Home Assistive Devices / Equipment Home Assistive Devices/Equipment: CBG Meter Home Equipment: Environmental consultant - 2 wheels, Crutches, Shower seat - built in  Prior Device Use: Indicate devices/aids used by the patient prior to current illness, exacerbation or injury? 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 Needed some help. Wife assisted with dressing post toe amputation.  Indoor Mobility: Did the patient need assistance with walking from room to room (with or  without device)?  Independent  Stairs: Did the patient need assistance with internal or external stairs (with or without device)? Independent  Functional Cognition: Did the patient need help planning regular tasks such as shopping or remembering to take medications? Independent  Current Functional Level Cognition  Overall Cognitive Status: Within Functional Limits for tasks assessed Orientation Level: Oriented X4   Extremity Assessment (includes Sensation/Coordination)  Upper Extremity Assessment: Overall WFL for tasks assessed  Lower Extremity Assessment: Defer to PT evaluation RLE Deficits / Details: limited by pain, noted bulbous swelling at distal end of BK amputation wrapped with ace bandage; able to extend hip and knee slowly to neutral in standing, lifts leg with assist, noted active quad, glut and adductor sets with exercises RLE: Unable to fully assess due to pain    ADLs  Overall ADL's : Needs assistance/impaired Eating/Feeding: Sitting, Set up Grooming: Set up, Sitting Upper Body Bathing: Set up, Sitting Lower Body Bathing: Moderate assistance, Sit to/from stand Upper Body Dressing : Set up, Sitting Lower Body Dressing: Moderate assistance, Maximal assistance, Sit to/from stand Toilet Transfer: Minimal assistance, Stand-pivot, BSC, Cueing for safety, +2 for safety/equipment Toilet Transfer Details (indicate cue type and reason): simulated transfer, pt transferred EOB to recliner Functional mobility during ADLs: Minimal assistance, Rolling walker, +2 for safety/equipment General ADL Comments: Pt educated on not placing a pillow under his knee, instead promoting knee extension while seated in recliner or lying in bed. Pt and wife eager to go to CIR.     Mobility  Overal bed mobility: Needs Assistance Bed Mobility: Supine to Sit Supine to sit: Min guard, HOB elevated Sit to supine: Supervision General bed mobility comments: Heavy use of rail for support and cues to use LLE to  bridge to scoot bottom to EOB.     Transfers  Overall transfer level: Needs assistance Equipment used: Rolling walker (2 wheeled) Transfers: Sit to/from Stand, [employer] Transfers Sit to Stand: Min assist, Mod assist Stand pivot transfers: Min assist General transfer comment: Cues for safety, hand placement, technique. Pt with posterior lean in standing and required cues to correct this as well as relax right residual limb in standing. Cues for upright as pt with flexed posture. Stood from [employer], from [facility] x1, from chair x1. SPT chair to/from [facility].    Ambulation / Gait / Stairs / Wheelchair Mobility  Ambulation/Gait Ambulation/Gait assistance: Museum/gallery curator (Feet): 5 Feet (+4') Assistive device: Rolling walker (2 wheeled) Gait Pattern/deviations: Trunk flexed ("hop to") General Gait Details: Cues for RW proximity, positioning. Difficulty extending RLE due to pain despite cues. Tremoring right quad. Gait velocity interpretation: Below normal speed for age/gender    Posture / Balance Dynamic Sitting Balance Sitting balance - Comments: Posterior lean with dynamic activities - performing bath sitting EOB.  Balance Overall balance assessment: Needs assistance Sitting-balance support: Feet supported, Bilateral upper extremity supported Sitting balance-Leahy Scale: Fair Sitting balance - Comments: Posterior lean with dynamic activities - performing bath sitting EOB.  Postural control: Posterior lean Standing balance support: During functional activity Standing balance-Leahy Scale: Poor Standing balance comment: Reliant on RW for support. Total A for peri care.     Special needs/care consideration BiPAP/CPAP Yes, has CPAP at home and in the acute hospital CPM No Continuous Drip IV KVO Dialysis No  Life Vest No Oxygen NO Special Bed No Trach Size No Wound Vac (area) No  Skin Has a new R BKa incision with dressing and ace wrap. Has a decubitus  ulcer on  right buttock per wife new since admission. Has a rash on his right face.  Bowel mgmt: Last BM 12/19/15 Bladder mgmt: Voiding in urinal WDL Diabetic mgmt Yes, is a diabetic, new diagnosis.    Previous Home Environment Living Arrangements: Spouse/significant other Available Help at Discharge: Family, Available 24 hours/day Type of Home: House Home Layout: Two level, Bed/bath upstairs, 1/2 bath on main level Alternate Level Stairs-Rails: Left Alternate Level Stairs-Number of Steps: 15 then landing then 6 more Home Access: Stairs to enter Entrance Stairs-Rails: None Entrance Stairs-Number of Steps: 4 from garage Bathroom Shower/Tub: Multimedia programmer: Handicapped height (standard height toilet in half bath downstairs) [place]: No  Discharge Living Setting Plans for Discharge Living Setting: Patient's home, House, Lives with (comment) (Lives with wife.) Type of Home at Discharge: House Discharge Home Layout: Multi-level, 1/2 bath on main level, Bed/bath upstairs Alternate Level Stairs-Number of Steps: 13 steps then a landing then 6 steps Discharge Home Access: Stairs to enter Entrance Stairs-Rails: None Entrance Stairs-Number of Steps: 2 steps at garage entry Does the patient have any problems obtaining your medications?: No  Social/Family/Support Systems Patient Roles: Spouse (Has a wife) Contact Information: Santino Kinsella - wife Anticipated Caregiver: wife and self Anticipated Caregiver's Contact Information: [name] - wife [phone] Ability/Limitations of Caregiver: Wife works PT, has a flexible schedule Caregiver Availability: 24/7 Discharge Plan Discussed with Primary Caregiver: Yes Is Caregiver In Agreement with Plan?: Yes Does Caregiver/Family have Issues with Lodging/Transportation while Pt is in Rehab?: No  Goals/Additional Needs Patient/Family Goal for Rehab: Mod I and supervision goals Expected length of  stay: 7-10 days Cultural Considerations: None Dietary Needs: Carb mod, med calorie, thin liquids diet Equipment Needs: TBD Pt/Family Agrees to Admission and willing to participate: Yes Program Orientation Provided & Reviewed with Pt/Caregiver Including Roles & Responsibilities: Yes  Decrease burden of Care through IP rehab admission: N/A  Possible need for SNF placement upon discharge: Not planned  Patient Condition: This patient's medical and functional status has changed since the consult dated: 12/17/15 in which the Rehabilitation Physician determined and documented that the patient's condition is appropriate for intensive rehabilitative care in an inpatient rehabilitation facility. See "History of Present Illness" (above) for medical update. Functional changes are: Currently requiring min/mod assist for transfers and ambulated 5 feet RW min assist. Patient's medical and functional status update has been discussed with the Rehabilitation physician and patient remains appropriate for inpatient rehabilitation. Will admit to inpatient rehab today.  Preadmission Screen Completed By: Retta Diones, 12/20/2015 1:06 PM ______________________________________________________________________  Discussed status with Dr. Letta Pate on 12/20/15 at 1306 and received telephone approval for admission today.  Admission Coordinator: Retta Diones, time1306/Date02/02/17          Cosigned by: Charlett Blake, MD at 12/20/2015 1:08 PM  Revision History     Date/Time User Provider Type Action   12/20/2015 1:08 PM Charlett Blake, MD Physician Cosign   12/20/2015 1:06 PM Retta Diones, RN Rehab Admission Coordinator Sign

## 2015-12-22 NOTE — Progress Notes (Signed)
Occupational Therapy Session Note  Patient Details  Name: Adam [name] MRN: [id number] Date of Birth: 09-30-1951  Today's Date: 12/22/2015 OT Individual Time: 0703-0803 OT Individual Time Calculation (min): 60 min    Short Term Goals: Week 1:  OT Short Term Goal 1 (Week 1): STG's = LTG's secondary to ELOS  Skilled Therapeutic Interventions/Progress Updates: Patient transferred bed to w/c with close supervision squat pivot and then completed bathing and dressing in w/c at sink with min A to maintain balance while standing to wash periarea & required assisted to thoroughly wash buttocks due to right leg amputation and thereby decreased standing balance.     Therapy Documentation Precautions:  Precautions Precautions: Fall Precaution Comments: R BKA Restrictions Weight Bearing Restrictions: Yes RLE Weight Bearing: Non weight bearing   Pain:denied & had taken pain meds about 45 prior to session    See Function Navigator for Current Functional Status.   Therapy/Group: Individual Therapy  Alfredia Ferguson [facility] 12/22/2015, 11:20 AM

## 2015-12-23 ENCOUNTER — Inpatient Hospital Stay (HOSPITAL_COMMUNITY): Payer: Medicare Other | Admitting: Occupational Therapy

## 2015-12-23 ENCOUNTER — Inpatient Hospital Stay (HOSPITAL_COMMUNITY): Payer: No Typology Code available for payment source | Admitting: Physical Therapy

## 2015-12-23 LAB — GLUCOSE, CAPILLARY
Glucose-Capillary: 93 mg/dL (ref 65–99)
Glucose-Capillary: 86 mg/dL (ref 65–99)
Glucose-Capillary: 115 mg/dL — ABNORMAL HIGH (ref 65–99)

## 2015-12-23 NOTE — Progress Notes (Signed)
Patient has home CPAP at bedside. 

## 2015-12-23 NOTE — Progress Notes (Addendum)
Physical Therapy Session Note  Patient Details  Name: Adam [name] MRN: [id number] Date of Birth: 1951/04/14  Today's Date: 12/23/2015 PT Individual Time: 0800-0900, 1300-1330 PT Individual Time Calculation (min): 60 min , 30 min  Short Term Goals: Week 1:  PT Short Term Goal 1 (Week 1): =LTGs due to ELOS  Skilled Therapeutic Interventions/Progress Updates:   Pt with improved ability to transfer and manage w/c for transfers benefiting from education. Pt also benefits from cues for weight shift for improved efficiency with transfers, but requires frequent cueing to carryover. Pt would continue to benefit from skilled PT services to increase functional mobility.  Therapy Documentation Precautions:  Precautions Precautions: Fall Precaution Comments: R BKA Required Braces or Orthoses: Other Brace/Splint Other Brace/Splint: post op shoe Rt Restrictions Weight Bearing Restrictions: Yes RLE Weight Bearing: Non weight bearing Vital Signs: Therapy Vitals Temp: 98.2 F (36.8 C) Temp Source: Oral Pulse Rate: 81 Resp: 18 BP: 130/71 mmHg Patient Position (if appropriate): Lying Oxygen Therapy SpO2: 99 % O2 Device: Not Delivered Pain: Pain Assessment Pain Assessment: 0-10 Pain Score: 7  Pain Location: Leg Pain Orientation: Right Pain Intervention(s):  (graded activity) Mobility:  Min A transfers with cues for weight shift, set-up, and technique Locomotion :   Min A with cues for pacing, sequencing, and weight shift 75' Other Treatments:    Tx 1: Pt educated on rehab plan, safety in mobility, progressing mobility, avoid overstressing LLE, and w/c components/use. Pt performs mirror therapy with LAQs, ankle inversion, ankle pumps, big toe DF 4x10. Patellar mobs with knee flexion AAROM, tibiofemoral mobs with knee ext AAROM. Pt performs transfers x10 in session.   Tx 2: Pt educated on safety in mobility, rehab plan, energy conservation during w/c propulsion, and w/c components.  Marching, anterior weight shifts with BUE support, glute sets in standing, and alternating UE elvation in standing with other UE on walker, anterior weight shifts with UE support. Static standing 1' and 3'. Ace wrapping for LE done with education on how to do for home.  See Function Navigator for Current Functional Status.   Therapy/Group: Individual Therapy  [place], [place] 12/23/2015, 8:37 AM

## 2015-12-23 NOTE — Progress Notes (Signed)
[facility]IL[facility]     PROGRESS NOTE  Subjective/Complaints:    Pain control is good currently Has busy day 4.5 hrs therapy  ROS: Denies CP, SOB, n/v/d, phantom pain.   Objective: Vital Signs: Blood pressure 130/71, pulse 81, temperature 98.2 F (36.8 C), temperature source Oral, resp. rate 18, weight 80.604 kg (177 lb 11.2 oz), SpO2 99 %. No results found.  Recent Labs  12/20/15 1608 12/21/15 0530  WBC 12.5* 12.0*  HGB 12.1* 11.3*  HCT 35.4* 32.9*  PLT 316 307    Recent Labs  12/20/15 1608 12/21/15 0530  NA  --  133*  K  --  4.4  CL  --  93*  GLUCOSE  --  117*  BUN  --  14  CREATININE 0.88 0.78  CALCIUM  --  8.9   CBG (last 3)   Recent Labs  12/22/15 1635 12/22/15 2147 12/23/15 0625  GLUCAP 232* 96 93    Wt Readings from Last 3 Encounters:  12/20/15 80.604 kg (177 lb 11.2 oz)  12/15/15 81.4 kg (179 lb 7.3 oz)  12/12/15 82.555 kg (182 lb)    Physical Exam:  BP 130/71 mmHg  Pulse 81  Temp(Src) 98.2 F (36.8 C) (Oral)  Resp 18  Wt 80.604 kg (177 lb 11.2 oz)  SpO2 99% Constitutional: He appears well-developed and well-nourished. NAD.  HENT: Normocephalic and atraumatic.  Eyes: Conjunctivae and EOM are normal.  Cardiovascular: Normal rate, regular rhythm. Murmur heard. Respiratory: Effort normal and breath sounds normal. No respiratory distress. GI: Soft. Bowel sounds are normal. He exhibits no distension.  Musculoskeletal: He exhibits mild edema (RLE) and tenderness (RLE).  Neurological: He is alert and oriented.  Motor: B/l UE, LLE 5/5 RLE: hip flexion 3+/5 (pain inhibition)  LLE: 5/5 proximal to distal Skin: Skin is warm and dry.  Stump c/d/i with moderate ecchymosis. Psychiatric: He has a normal mood and affect. His behavior is normal  Assessment/Plan: 1. Functional deficits secondary toRight BKA,  PVD with profound ischemic changes which require 3+ hours per day of interdisciplinary therapy in a comprehensive  inpatient rehab setting. Physiatrist is providing close team supervision and 24 hour management of active medical problems listed below. Physiatrist and rehab team continue to assess barriers to discharge/monitor patient progress toward functional and medical goals.  Function:  Bathing Bathing position Bathing activity did not occur:  (did not occur during OT eval, will complete next session) Position: Wheelchair/chair at sink  Bathing parts Body parts bathed by patient: Right arm, Left arm, Chest, Abdomen, Front perineal area, Right upper leg, Left upper leg, Left lower leg Body parts bathed by helper: Buttocks, Back  Bathing assist        Upper Body Dressing/Undressing Upper body dressing Upper body dressing/undressing activity did not occur:  (did not occur during OT eval, will complete next session) What is the patient wearing?: Pull over shirt/dress, Button up shirt     Pull over shirt/dress - Perfomed by patient: Thread/unthread right sleeve, Thread/unthread left sleeve, Put head through opening, Pull shirt over trunk          Upper body assist Assist Level: Set up   Set up : To obtain clothing/put away  Lower Body Dressing/Undressing Lower body dressing Lower body dressing/undressing activity did not occur:  (Did not occur during OT eval, will complete next session. Pt is able to reach LLE for LB ADLs, however he did loose balance to left when attempting to don sock. ) What  is the patient wearing?: Pants, Socks Underwear - Performed by patient: Thread/unthread left underwear leg, Pull underwear up/down Underwear - Performed by helper: Thread/unthread right underwear leg, Pull underwear up/down Pants- Performed by patient: Thread/unthread right pants leg, Thread/unthread left pants leg, Fasten/unfasten pants Pants- Performed by helper: Pull pants up/down     Socks - Performed by patient: Don/doff left sock                Lower body assist Assist for lower body  dressing: Touching or steadying assistance (Pt > 75%)      Toileting Toileting Toileting activity did not occur:  (not needed during session)        Toileting assist     Transfers Chair/bed transfer   Chair/bed transfer method: Stand pivot, Squat pivot Chair/bed transfer assist level: Touching or steadying assistance (Pt > 75%) Chair/bed transfer assistive device: Armrests, Medical sales representative     Max distance: 50 Assist level: Touching or steadying assistance (Pt > 75%)   Wheelchair   Type: Manual Max wheelchair distance: 150 Assist Level: Supervision or verbal cues  Cognition Comprehension Comprehension assist level: Follows basic conversation/direction with extra time/assistive device  Expression Expression assist level: Expresses complex 90% of the time/cues < 10% of the time  Social Interaction Social Interaction assist level: Interacts appropriately with others - No medications needed.  Problem Solving Problem solving assist level: Solves complex problems: With extra time  Memory Memory assist level: Recognizes or recalls 90% of the time/requires cueing < 10% of the time    Medical Problem List and Plan: 1. Right BKA 12/16/2015 secondary to secondary to PVD with profound ischemic changes- tolerating therapy program on current pain med regimen Clean amputation daily with soap and water Monitor incision site for signs of infection or impending skin breakdown. Staples to remain in place for 3-4 weeks Stump shrinker, for edema control  Scar mobilization massaging to prevent soft tissue adherence Stump protector during therapies Prevent flexion contractures by implementing the following:   Encourage prone lying for 20-30 mins per day BID to avoid hip flexion Contractures   Avoid pillow under knees when patient is lying in bed in order to prevent both knee and hip flexion contractures;  Avoid prolonged sitting Post surgical pain control with oral  medication Phantom limb pain control with physical modalities including desensitization techniques (gentle self massage to the residual stump,hot packs if sensation iintact, [place]) and mirror therapy, TENS. If ineffective, consider pharmacological treatment for neuropathic pain (e.g gabapentin, pregabalin, amytriptalyine, duloxetine).  When using wheelchair, patient should have knee on amputated side fully extended with board under the seat cushion. Avoid injury to contralateral side   2. DVT Prophylaxis/Anticoagulation: Subcutaneous Lovenox. Monitor platelet counts and any signs of bleeding 3. Pain Management: Celebrex 200 mg twice a day, fentanyl patch 50 g, Neurontin 300 mg twice a day,Oxycodone 30mg  every 4 hours,robaxin as needed. Monitor mental status closely and taper as tolerated in a few days. 4. Hypertension. Hydrochlorothiazide 12.5 mg daily, lisinopril 20 mg daily. Monitor with increased mobility 130/71, no med changes at [facility] point 5. Neuropsych: This patient is capable of making decisions on his own behalf. 6. Skin/Wound Care: Routine skin checks, MAST- zinc/barrier cream to buttocks 7. Fluids/Electrolytes/Nutrition: Routine I&O   Mild Hyponatremia: 133 on 2/3 (stable) - Will cont to monitor  bmet in am 8. Diabetes mellitus of peripheral neuropathy. Hemoglobin A1c 5.7. Check blood sugar before meals and at bedtime. Sliding scale insulin. Patient diet  controlled prior to admission- generally controlled, cont to monitor and adjust   CBG (last 3)   Recent Labs  12/22/15 1635 12/22/15 2147 12/23/15 0625  GLUCAP 232* 96 93    9. Hyperlipidemia. Lipitor 10. Leukocytosis:  12.0 on 2/3 (stable), recheck in am   11. ABLA  Hb 11.3 on 2/3   labs for Monday 12. Transaminitis - may be statin related   labs for Monday 13.  Superficial skin infx looks like staph/strp on face and buttocks- mucpirocin LOS (Days) 3 A FACE TO FACE EVALUATION WAS PERFORMED  KIRSTEINS,ANDREW E 12/23/2015  8:42 AM

## 2015-12-23 NOTE — Progress Notes (Signed)
Occupational Therapy Session Note  Patient Details  Name: Adam [name] MRN: [id number] Date of Birth: 1951-10-11  Today's Date: 12/23/2015 OT Individual Time:  - 1100-1215  (53  Min)   1st session:                                           1500-1545  (45 min)  2nd session   Short Term Goals: Week 1:  OT Short Term Goal 1 (Week 1): STG's = LTG's secondary to ELOS :     Skilled Therapeutic Interventions/Progress Updates:   1st session:   Upon entering room, pt sitting in wc  Wife present.  Pt agreed to shower.  Prepared pt for shower.  Pt propelled wc to shower bench with min cues for positioning.  Transferred to bench with cues to lock chair before transferring.  Pt transferred to bench with min to SBA. Pt showered with with close supervision.  He doffed the dressing on his buttocks.  Dressed back in wc at sink.  Wife, [name] assisted with set up for dressing.  Pt was SBA with dressing.  Performed theraband exercises with instructions to hold in extension for count of 10.     Educated on keeping left leg in extension at knee.  Performed 7 reps of hamstring stretches.   Went over stump rapping with wife.  Notified RN of stump bleeding at one of the staple sites.  Changed dressing.  Wrapped stump with instructions to wife.  Left pt seated in w/c with lunch and wife present.   2nd session:  Engaged in theraband exercises with pt having to recall exercises from previous session.  Pt. Relied on daughter, Trey Sailors to help with recall.  He performed exercises with cues for unsupported sitting and keeping track of count.  Practiced standing balance for 2 minutes with one hand for support at mat.  Pt propelled self in wc back to room with daughter.    Therapy Documentation Precautions:  Precautions Precautions: Fall Precaution Comments: R BKA Restrictions Weight Bearing Restrictions: Yes RLE Weight Bearing: Non weight bearing      Pain: Pain Assessment Pain Assessment: 0-10 Pain Score: 10   Right leg.  RN gave pain meds  1st session:                       4/10  Right leg       See Function Navigator for Current Functional Status.   Therapy/Group: Individual Therapy  Lisa Roca 12/23/2015, 8:03 AM

## 2015-12-24 ENCOUNTER — Inpatient Hospital Stay (HOSPITAL_COMMUNITY): Payer: Medicare Other | Admitting: Occupational Therapy

## 2015-12-24 ENCOUNTER — Inpatient Hospital Stay (HOSPITAL_COMMUNITY): Payer: No Typology Code available for payment source | Admitting: Physical Therapy

## 2015-12-24 DIAGNOSIS — R58 Hemorrhage, not elsewhere classified: Secondary | ICD-10-CM | POA: Insufficient documentation

## 2015-12-24 LAB — COMPREHENSIVE METABOLIC PANEL
ALK PHOS: 88 U/L (ref 38–126)
ALT: 169 U/L — ABNORMAL HIGH (ref 17–63)
ANION GAP: 11 (ref 5–15)
AST: 139 U/L — ABNORMAL HIGH (ref 15–41)
Albumin: 2.5 g/dL — ABNORMAL LOW (ref 3.5–5.0)
BILIRUBIN TOTAL: 0.7 mg/dL (ref 0.3–1.2)
BUN: 13 mg/dL (ref 6–20)
CALCIUM: 8.8 mg/dL — AB (ref 8.9–10.3)
CO2: 29 mmol/L (ref 22–32)
Chloride: 94 mmol/L — ABNORMAL LOW (ref 101–111)
Creatinine, Ser: 0.82 mg/dL (ref 0.61–1.24)
GFR calc non Af Amer: >60 mL/min (ref >60)
Glucose, Bld: 111 mg/dL — ABNORMAL HIGH (ref 65–99)
POTASSIUM: 4.1 mmol/L (ref 3.5–5.1)
Sodium: 134 mmol/L — ABNORMAL LOW (ref 135–145)
TOTAL PROTEIN: 5.8 g/dL — AB (ref 6.5–8.1)

## 2015-12-24 LAB — CBC WITH DIFFERENTIAL/PLATELET
Basophils Absolute: 0 10*3/uL (ref 0.0–0.1)
Basophils Relative: 0 %
EOS ABS: 0.3 10*3/uL (ref 0.0–0.7)
Eosinophils Relative: 3 %
HEMATOCRIT: 30.5 % — AB (ref 39.0–52.0)
HEMOGLOBIN: 10.4 g/dL — AB (ref 13.0–17.0)
LYMPHS ABS: 0.9 10*3/uL (ref 0.7–4.0)
Lymphocytes Relative: 8 %
MCH: 31 pg (ref 26.0–34.0)
MCHC: 34.1 g/dL (ref 30.0–36.0)
MCV: 90.8 fL (ref 78.0–100.0)
MONO ABS: 1 10*3/uL (ref 0.1–1.0)
MONOS PCT: 9 %
NEUTROS PCT: 80 %
Neutro Abs: 9.4 10*3/uL — ABNORMAL HIGH (ref 1.7–7.7)
Platelets: 331 10*3/uL (ref 150–400)
RBC: 3.36 MIL/uL — ABNORMAL LOW (ref 4.22–5.81)
RDW: 12.7 % (ref 11.5–15.5)
WBC: 11.7 10*3/uL — ABNORMAL HIGH (ref 4.0–10.5)

## 2015-12-24 LAB — GLUCOSE, CAPILLARY
GLUCOSE-CAPILLARY: 109 mg/dL — AB (ref 65–99)
GLUCOSE-CAPILLARY: 108 mg/dL — AB (ref 65–99)
GLUCOSE-CAPILLARY: 110 mg/dL — AB (ref 65–99)
GLUCOSE-CAPILLARY: 113 mg/dL — AB (ref 65–99)
GLUCOSE-CAPILLARY: 114 mg/dL — AB (ref 65–99)
Glucose-Capillary: 125 mg/dL — ABNORMAL HIGH (ref 65–99)
Glucose-Capillary: 85 mg/dL (ref 65–99)
Glucose-Capillary: 114 mg/dL — ABNORMAL HIGH (ref 65–99)

## 2015-12-24 NOTE — Progress Notes (Signed)
Patient ID: Adam [name], male   DOB: 07-Oct-1951, 65 y.o.   MRN: [id number] Continues to make progress in rehabilitation. Had some bleeding. Over the weekend. I took down his dressing. He does have old blood oozing from midportion of the below-knee amputation stump. The bruising is resolving and the posterior flap. He does have more changes of duskiness over the anterior compartment on the lateral aspect of his anterior stump. I took out several staples from the midportion of the incision to allow the blood to drain. Discussed this with the patient and his wife present. They understood the concern for healing regarding from initial amputation. He did have some tenderness over the anterior muscle body and some diminished contraction on electrocautery of this at the time of his below-knee amputation. Patient does not want to "drag this out". Will see again daily. If obviously needs conversion to above-knee amputation will proceed. He understands that he will continue rehabilitation until that time and that the rehabilitation activities would be the same at this point for above-knee versus below-knee amputation.

## 2015-12-24 NOTE — Progress Notes (Signed)
Occupational Therapy Session Note  Patient Details  Name: Adam [name] MRN: [id number] Date of Birth: [birth year]  Today's Date: 12/24/2015 OT Individual Time:  -   OT Individual Time:  -   1st session:  0800-0900  (60 min)                                          2nd session  1030-1200  (90 min)       Short Term Goals: Week 1:  OT Short Term Goal 1 (Week 1): STG's = LTG's secondary to ELOS  Skilled Therapeutic Interventions/Progress Updates:    1st session:  Focus of treatment was bed mobility, transfers,  Neuro-muscular reeducation, sitting balance, standing balance, attention, therapeutic activities,, postural control.  Pt. Engaged in gathering clothes for bathing and dressing at the wc level.to gather clothes.  Prepared residual limb for shower.  Pt transferred to tub shower on 4 MW bathroom.  Needed cues fpr wc locking.  Pt. Performed bathing and dressed in wc Propelled wc to room and performed UE therapband exercises one set.  Left in room with all needs in reach.       2nd session:  Addressed standing balance and functional mobility in kitchen activity.  Pt ambulated with RW 10 feet x 3 with min assist for balance.  He did sit to stand to gather items with min assit for balance and cues for technique.  Needs reinforcement on wc postioning, locking brakes.  Taking off foot pedals, sit to stand with walker and stand to sit for hand placement.   Therapy Documentation Precautions:  Precautions Precautions: Fall Precaution Comments: R BKA Required Braces or Orthoses: Other Brace/Splint Other Brace/Splint: post op shoe Rt Restrictions Weight Bearing Restrictions: Yes RLE Weight Bearing: Non weight bearing      Pain: Pain Assessment Pain Assessment: 0-10 Pain Score: 10-Worst pain ever        See Function Navigator for Current Functional Status.   Therapy/Group: Individual Therapy  Lisa Roca 12/24/2015, 7:45 AM

## 2015-12-24 NOTE — Progress Notes (Signed)
Physical Therapy Session Note  Patient Details  Name: Adam [name] MRN: [id number] Date of Birth: 1951/04/07  Today's Date: 12/24/2015 PT Individual Time: 6270-3500 PT Individual Time Calculation (min): 63 min   Short Term Goals: Week 1:  PT Short Term Goal 1 (Week 1): =LTGs due to ELOS  Skilled Therapeutic Interventions/Progress Updates:   Pt received sitting EOB and agreeable to therapy session.  C/O 7/10 pain in residual limb, RN notified and in to distribute meds.  Session focus on gait training, therex, pain control, and patient/family education.  Sit<>stand from EOB with supervision and verbal cues to push up from bed.  Gait training x70' with close supervision and verbal cues for walker positioning.  Pt transitioned to w/c and propelled to therapy gym.  Squat/pivot from w/c>therapy mat with steady assist and mod verbal cues for w/c set up.  PT instructed pt in BLE LAQ 2x10 with verbal cues for full knee extension, 2x10 glute squeezes, 2x10 isometric hip extension against 6" bolster, 2x10 R hip abduction with manual facilitation to maintain neutral hip positioning, and 2x10 R hip extension in standing.  Pt continues to report discomfort in residual limb.  Attempted use of estim with pt reporting intact sensation to light touch in R residual limb.  Estim applied and pt reported not being able to feel stimulation; moved electrodes to pt's arm and reported tingling sensation at lower amplitude.  Provided pt and pt's daughter with explanation of contraindications to using estim with patient being asensate in RLE.  Pt returned to w/c and propelled back to room, squat/pivot to bed with steady assist and verbal cues for w/c set up.  Pt positioned sitting EOB with daughter present, call bell in reach, and needs met.   Therapy Documentation Precautions:  Precautions Precautions: Fall Precaution Comments: R BKA Required Braces or Orthoses: Other Brace/Splint Other Brace/Splint: post op shoe  Rt Restrictions Weight Bearing Restrictions: Yes RLE Weight Bearing: Non weight bearing   Pain: Pain Assessment Pain Assessment: 0-10 Pain Score: 10-Worst pain ever Pain Type: Neuropathic pain Pain Location: Leg Pain Orientation: Right Pain Descriptors / Indicators: Burning;Tingling Pain Frequency: Intermittent Pain Onset: Gradual Pain Intervention(s): RN made aware;Repositioned;Emotional support;Elevated extremity   See Function Navigator for Current Functional Status.   Therapy/Group: Individual Therapy  Earnest Conroy Penven-Crew 12/24/2015, 4:11 PM

## 2015-12-24 NOTE — Progress Notes (Signed)
[facility] MEDICINE & REHABILITATION     PROGRESS NOTE  Subjective/Complaints:  Pt sitting up in bed.  He notes something happened yesterday in therapies and he had blood from his stump site.    ROS: Denies CP, SOB, n/v/d, phantom pain.   Objective: Vital Signs: Blood pressure 131/70, pulse 86, temperature 99 F (37.2 C), temperature source Oral, resp. rate 18, weight 80.604 kg (177 lb 11.2 oz), SpO2 99 %. No results found. No results for input(s): WBC, HGB, HCT, PLT in the last 72 hours. No results for input(s): NA, K, CL, GLUCOSE, BUN, CREATININE, CALCIUM in the last 72 hours.  Invalid input(s): CO CBG (last 3)   Recent Labs  12/23/15 1637 12/23/15 2134 12/24/15 0649  GLUCAP 86 115* 110*    Wt Readings from Last 3 Encounters:  12/20/15 80.604 kg (177 lb 11.2 oz)  12/15/15 81.4 kg (179 lb 7.3 oz)  12/12/15 82.555 kg (182 lb)    Physical Exam:  BP 131/70 mmHg  Pulse 86  Temp(Src) 99 F (37.2 C) (Oral)  Resp 18  Wt 80.604 kg (177 lb 11.2 oz)  SpO2 99% Constitutional: He appears well-developed and well-nourished. NAD.  HENT: Normocephalic and atraumatic.  Eyes: Conjunctivae and EOM are normal.  Cardiovascular: Normal rate, regular rhythm. Murmur heard. Respiratory: Effort normal and breath sounds normal. No respiratory distress. GI: Soft. Bowel sounds are normal. He exhibits no distension.  Musculoskeletal: He exhibits mild edema (RLE) and tenderness (RLE).  Neurological: He is alert and oriented.  Motor: B/l UE, LLE 5/5 RLE: hip flexion 4/5 (pain inhibition)  LLE: 5/5 proximal to distal Skin: Skin is warm and dry.  Stump with blood oozing from medial portion and moderate ecchymosis. Psychiatric: He has a normal mood and affect. His behavior is normal  Assessment/Plan: 1. Functional deficits secondary toRight BKA,  PVD with profound ischemic changes which require 3+ hours per day of interdisciplinary therapy in a comprehensive inpatient rehab  setting. Physiatrist is providing close team supervision and 24 hour management of active medical problems listed below. Physiatrist and rehab team continue to assess barriers to discharge/monitor patient progress toward functional and medical goals.  Function:  Bathing Bathing position Bathing activity did not occur:  (did not occur during OT eval, will complete next session) Position: Shower  Bathing parts Body parts bathed by patient: Right arm, Left arm, Chest, Abdomen, Front perineal area, Right upper leg, Left upper leg, Left lower leg Body parts bathed by helper: Buttocks, Back  Bathing assist Assist Level: Touching or steadying assistance(Pt > 75%)      Upper Body Dressing/Undressing Upper body dressing Upper body dressing/undressing activity did not occur:  (did not occur during OT eval, will complete next session) What is the patient wearing?: Pull over shirt/dress, Button up shirt     Pull over shirt/dress - Perfomed by patient: Thread/unthread right sleeve, Thread/unthread left sleeve, Put head through opening, Pull shirt over trunk          Upper body assist Assist Level: Set up   Set up : To obtain clothing/put away  Lower Body Dressing/Undressing Lower body dressing Lower body dressing/undressing activity did not occur:  (Did not occur during OT eval, will complete next session. Pt is able to reach LLE for LB ADLs, however he did loose balance to left when attempting to don sock. ) What is the patient wearing?: Pants, Socks Underwear - Performed by patient: Thread/unthread left underwear leg, Pull underwear up/down Underwear - Performed by helper: Thread/unthread  right underwear leg, Pull underwear up/down Pants- Performed by patient: Thread/unthread right pants leg, Thread/unthread left pants leg, Fasten/unfasten pants Pants- Performed by helper: Pull pants up/down     Socks - Performed by patient: Don/doff left sock                Lower body assist Assist  for lower body dressing: Touching or steadying assistance (Pt > 75%)      Toileting Toileting Toileting activity did not occur:  (not needed during session)        Toileting assist     Transfers Chair/bed transfer   Chair/bed transfer method: Stand pivot, Squat pivot Chair/bed transfer assist level: Touching or steadying assistance (Pt > 75%) Chair/bed transfer assistive device: Armrests, Medical sales representative     Max distance: 60 Assist level: Touching or steadying assistance (Pt > 75%)   Wheelchair   Type: Manual Max wheelchair distance: 200 Assist Level: Supervision or verbal cues  Cognition Comprehension Comprehension assist level: Follows basic conversation/direction with extra time/assistive device  Expression Expression assist level: Expresses complex 90% of the time/cues < 10% of the time  Social Interaction Social Interaction assist level: Interacts appropriately with others - No medications needed.  Problem Solving Problem solving assist level: Solves complex problems: With extra time  Memory Memory assist level: Recognizes or recalls 90% of the time/requires cueing < 10% of the time    Medical Problem List and Plan: 1. Right BKA 12/16/2015 secondary to secondary to PVD with profound ischemic changes- tolerating therapy program on current pain med regimen Clean amputation daily with soap and water Monitor incision site for signs of infection or impending skin breakdown. Staples to remain in place for 3-4 weeks Stump shrinker, for edema control  Scar mobilization massaging to prevent soft tissue adherence Stump protector during therapies Prevent flexion contractures by implementing the following:   Encourage prone lying for 20-30 mins per day BID to avoid hip flexion Contractures   Avoid pillow under knees when patient is lying in bed in order to prevent both knee and hip flexion contractures;  Avoid prolonged sitting Post surgical pain control with  oral medication Phantom limb pain control with physical modalities including desensitization techniques (gentle self massage to the residual stump,hot packs if sensation iintact, [place]) and mirror therapy, TENS. If ineffective, consider pharmacological treatment for neuropathic pain (e.g gabapentin, pregabalin, amytriptalyine, duloxetine).  When using wheelchair, patient should have knee on amputated side fully extended with board under the seat cushion. Avoid injury to contralateral side  Encourage compression to stump and will hold anticoagulation due to bleed. 2. DVT Prophylaxis/Anticoagulation: Subcutaneous Lovenox (will hold today due to bleeding from incision). Monitor platelet counts and any signs of bleeding 3. Pain Management: Celebrex 200 mg twice a day, fentanyl patch 50 g, Neurontin 300 mg twice a day,Oxycodone 30mg  every 4 hours,robaxin as needed. Monitor mental status closely and taper as tolerated in a few days. 4. Hypertension. Hydrochlorothiazide 12.5 mg daily, lisinopril 20 mg daily. Monitor with increased mobility  5. Neuropsych: This patient is capable of making decisions on his own behalf. 6. Skin/Wound Care: Routine skin checks, MAST- zinc/barrier cream to buttocks 7. Fluids/Electrolytes/Nutrition: Routine I&O   Mild Hyponatremia: 133 on 2/3 (stable) - Will cont to monitor  Labs pending 8. Diabetes mellitus of peripheral neuropathy. Hemoglobin A1c 5.7. Check blood sugar before meals and at bedtime. Sliding scale insulin. Patient diet controlled prior to admission   CBG (last 3)   Recent Labs  12/23/15 1637 12/23/15 2134 12/24/15 0649  GLUCAP 86 115* 110*   9. Hyperlipidemia. Lipitor 10. Leukocytosis:  12.0 on 2/3 (stable)  Labs pending  11. ABLA  Hb 11.3 on 2/3   Labs pending 12. Transaminitis - may be statin related   Labs pending 13.  Superficial skin infx looks like staph/strp on face and buttocks  Mucpirocin  LOS (Days) 4 A FACE TO FACE EVALUATION WAS  PERFORMED  Khiana Camino Lorie Phenix 12/24/2015 8:51 AM

## 2015-12-25 ENCOUNTER — Inpatient Hospital Stay (HOSPITAL_COMMUNITY): Payer: No Typology Code available for payment source | Admitting: Physical Therapy

## 2015-12-25 ENCOUNTER — Inpatient Hospital Stay (HOSPITAL_COMMUNITY): Payer: No Typology Code available for payment source

## 2015-12-25 ENCOUNTER — Inpatient Hospital Stay (HOSPITAL_COMMUNITY): Payer: Medicare Other

## 2015-12-25 ENCOUNTER — Other Ambulatory Visit: Payer: Self-pay

## 2015-12-25 DIAGNOSIS — T8189XD Other complications of procedures, not elsewhere classified, subsequent encounter: Secondary | ICD-10-CM

## 2015-12-25 DIAGNOSIS — Z0279 Encounter for issue of other medical certificate: Secondary | ICD-10-CM

## 2015-12-25 DIAGNOSIS — K5903 Drug induced constipation: Secondary | ICD-10-CM | POA: Insufficient documentation

## 2015-12-25 DIAGNOSIS — Z89519 Acquired absence of unspecified leg below knee: Secondary | ICD-10-CM

## 2015-12-25 DIAGNOSIS — Z5189 Encounter for other specified aftercare: Secondary | ICD-10-CM

## 2015-12-25 DIAGNOSIS — T8189XA Other complications of procedures, not elsewhere classified, initial encounter: Secondary | ICD-10-CM

## 2015-12-25 LAB — GLUCOSE, CAPILLARY
GLUCOSE-CAPILLARY: 110 mg/dL — AB (ref 65–99)
Glucose-Capillary: 122 mg/dL — ABNORMAL HIGH (ref 65–99)
Glucose-Capillary: 96 mg/dL (ref 65–99)
Glucose-Capillary: 109 mg/dL — ABNORMAL HIGH (ref 65–99)

## 2015-12-25 MED ORDER — DOCUSATE SODIUM 100 MG PO CAPS
200.0000 mg | ORAL_CAPSULE | Freq: Three times a day (TID) | ORAL | Status: DC
  Administered 2015-12-25 – 2015-12-26 (×3): 200 mg via ORAL
  Filled 2015-12-25 (×4): qty 2

## 2015-12-25 MED ORDER — SENNA 8.6 MG PO TABS
2.0000 | ORAL_TABLET | Freq: Every day | ORAL | Status: DC
  Administered 2015-12-25: 17.2 mg via ORAL
  Filled 2015-12-25: qty 2

## 2015-12-25 MED ORDER — SODIUM CHLORIDE 0.9 % IV SOLN: INTRAVENOUS | Status: DC

## 2015-12-25 MED ORDER — DEXTROSE 5 % IV SOLN
1.5000 g | INTRAVENOUS | Status: AC
  Administered 2015-12-26: 1.5 g via INTRAVENOUS
  Filled 2015-12-25: qty 1.5

## 2015-12-25 MED ORDER — POLYETHYLENE GLYCOL 3350 17 G PO PACK
17.0000 g | PACK | Freq: Every day | ORAL | Status: DC
  Administered 2015-12-25 – 2015-12-26 (×2): 17 g via ORAL
  Filled 2015-12-25 (×2): qty 1

## 2015-12-25 MED ORDER — CHLORHEXIDINE GLUCONATE CLOTH 2 % EX PADS: 6.0000 | MEDICATED_PAD | Freq: Once | CUTANEOUS | Status: DC

## 2015-12-25 NOTE — Discharge Summary (Signed)
Discharge summary job # [id number]

## 2015-12-25 NOTE — Discharge Instructions (Signed)
Inpatient Rehab Discharge Instructions  Adam [name] Discharge date and time: No discharge date for patient encounter.   Activities/Precautions/ Functional Status: Activity: activity as tolerated Diet: diabetic diet Wound Care: keep wound clean and dry Functional status:  ___ No restrictions     ___ Walk up steps independently ___ 24/7 supervision/assistance   ___ Walk up steps with assistance ___ Intermittent supervision/assistance  ___ Bathe/dress independently ___ Walk with walker     __x_ Bathe/dress with assistance ___ Walk Independently    ___ Shower independently ___ Walk with assistance    ___ Shower with assistance ___ No alcohol     ___ Return to work/school ________  Special Instructions:    My questions have been answered and I understand these instructions. I will adhere to these goals and the provided educational materials after my discharge from the hospital.  Patient/Caregiver Signature _______________________________ Date __________  Clinician Signature _______________________________________ Date __________  Please bring this form and your medication list with you to all your follow-up doctor's appointments.

## 2015-12-25 NOTE — Progress Notes (Signed)
Physical Therapy Session Note  Patient Details  Name: Adam [name] MRN: [id number] Date of Birth: 05-27-1951  Today's Date: 12/25/2015 PT Individual Time: 1300-1408 PT Individual Time Calculation (min): 68 min   Short Term Goals: Week 1:  PT Short Term Goal 1 (Week 1): =LTGs due to ELOS  Skilled Therapeutic Interventions/Progress Updates:   pt received resting in w/c and agreeable to therapy session.  Pt reports plan to revise BKA to AKA tomorrow AM and voiced some concerns about mobility set backs.  PT reassured patient and provided emotional support.  Session focus on endurance, upper body strength, LE exercise, balance, and patient education.  Pt propelled w/c x200' to therapy apartment and PT instructed patient in bed mobility in elevated bed. Stand/pivot from w/c<>bed with RW and supervision with min question cues for w/c set up.  PT educated pt in scooting backwards onto bed to ensure safe transition from sit>supine.  Bed mobility on apartment bed with mod I.  Pt transitioned back to w/c with supervision and self propelled w/c outside.  Pt is able to propel w/c 150 ft. or more and is able to turn around, maneuver to table, bed or toilet, maneuver on rugs and over door sills, and can negotiate a 3% grade modified independent.  PT instructed patient in BLE therex 2x20 reps for LAQ, hip flexion, and w/c push ups.  Attempted spelling alphabet with L foot, but pt unable to follow directions despite explanation and demonstration.  AP x15 with mod verbal cues to move only the foot and not the whole LE.  Standing LE therex at rail in hallway x20 reps of hip extension and hip abd on RLE for improved hip mobility and strength.  Pt returned to room at end of session mod I in w/c and performed squat/pivot transfer from w/c>bed with mod verbal cues for w/c set up.  Pt positioned supine in bed with call bell in reach and needs met.     Therapy Documentation Precautions:  Precautions Precautions:  Fall Precaution Comments: R BKA Required Braces or Orthoses: Other Brace/Splint Other Brace/Splint: post op shoe Rt Restrictions Weight Bearing Restrictions: Yes RLE Weight Bearing: Non weight bearing Pain: Pain Assessment Pain Assessment: 0-10 Pain Score: 6  Pain Location: Leg Pain Orientation: Right Pain Descriptors / Indicators: Aching;Throbbing Pain Onset: On-going Pain Intervention(s): Repositioned;Emotional support   See Function Navigator for Current Functional Status.   Therapy/Group: Individual Therapy  Earnest Conroy Penven-Crew 12/25/2015, 2:08 PM

## 2015-12-25 NOTE — Progress Notes (Signed)
Physical Therapy Session Note  Patient Details  Name: Adam [name] MRN: [id number] Date of Birth: [birth year]  Today's Date: 12/25/2015 PT Individual Time: [id number] PT Individual Time Calculation (min): 29 min   Short Term Goals: Week 1:  PT Short Term Goal 1 (Week 1): =LTGs due to ELOS  Skilled Therapeutic Interventions/Progress Updates:    Patient reporting MD in to visualize residual limb this am and noted healing not sufficient and plans to return to surgery tomorrow to revise to AKA.  Patient and wife in room and reports feeling increased pain due to non healing wound and feel revision necessary for proper healing.  Performed transfer to w/c with supervision and cues for safety, needed frequent reminders of safety with w/c set up and hand placement for all transfers throughout session.  Patient propels w/c to and from therapy gym with supervision.  Performing mat exercises as noted below.  Sit to stand practice x 5 mod cues for hand placement.  Patient ambulated 51' minguard to supervision assist and negotiated 4 steps with L rail and crutch mod A cues and visual demo for technique. Patient performed standing balance/weight shifts with center line/yardstick for limits of stability and awareness of COG both lateral and Ant/post with counter for support 2 x 20 reps each.  Left in w/c with call bell in reach.  Therapy Documentation Precautions:  Precautions Precautions: Fall Precaution Comments: R BKA Required Braces or Orthoses: Other Brace/Splint Other Brace/Splint: post op shoe Rt Restrictions Weight Bearing Restrictions: Yes RLE Weight Bearing: Non weight bearing Pain: Pain Assessment Pain Score: 6  Pain Type: Surgical pain Pain Location: Leg Pain Orientation: Right Pain Descriptors / Indicators: Aching;Sharp Pain Onset: With Activity Pain Intervention(s): Rest;Repositioned Exercises: Amputee Exercises Quad Sets: Right;15 reps;Supine Gluteal Sets: Both;15  reps;Supine Towel Squeeze: Both;10 reps;Supine Straight Leg Raises: 10 reps;Right;Supine Other Exercises Other Exercises: bridging with legs over bolster x 10 5 sec hold   See Function Navigator for Current Functional Status.   Therapy/Group: Individual Therapy  Dulac, Douds 12/25/2015  12/25/2015, 9:50 AM

## 2015-12-25 NOTE — Discharge Summary (Signed)
Adam [name], Adam [name] NO.:  [id number]  MEDICAL RECORD NO.:  [id number]  LOCATION:  [id number]                        FACILITY:  [facility]  PHYSICIAN:  Delice Lesch, MD        DATE OF BIRTH:  1951/01/14  DATE OF ADMISSION:  12/20/2015 DATE OF DISCHARGE:  12/26/2015                              DISCHARGE SUMMARY   DISCHARGE DIAGNOSES: 1. Right below-knee-amputation on December 16, 2015, secondary to     peripheral vascular disease with progressive ischemic changes. 2. Subcutaneous Lovenox for deep vein thrombosis prophylaxis. 3. Pain management. 4. Hypertension. 5. Diabetes mellitus with peripheral neuropathy. 6. Hyperlipidemia. 7. Acute blood loss anemia. 8. Constipation.  HISTORY OF PRESENT ILLNESS:  This is a 65 year old right-handed male with history of remote tobacco abuse, diabetes mellitus, peripheral vascular disease with revascularization procedures in the past, right great toe amputation on December 01, 2015, and chronic pain management. The patient lives with his wife.  Presented on December 15, 2015, with severe right foot pain and poor healing of recent right great toe amputation with profound ischemic changes.  Limb was not felt to be salvageable and underwent right below-knee-amputation on December 16, 2015, per Dr. Donnetta Hutching.  Hospital course, pain management.  Subcutaneous Lovenox for DVT prophylaxis.  Physical and occupational therapy ongoing. The patient was admitted for a comprehensive rehab program.  PAST MEDICAL HISTORY:  See discharge diagnoses.  SOCIAL HISTORY:  He lives with spouse, reported to be independent prior to admission.  FUNCTIONAL HISTORY UPON ADMISSION TO REHAB SERVICES:  Min assist stand pivot transfers, +2 safety for sit to stand, min assist supine to sit, min to mod assist activities of daily living.  PHYSICAL EXAMINATION:  VITAL SIGNS:  Blood pressure 130/69, pulse 82, temperature 98, respirations 20. GENERAL:  This was an alert  male, oriented x3. LUNGS:  Clear to auscultation without wheeze. CARDIAC:  Regular rhythm, no murmur. ABDOMEN:  Soft, nontender.  Good bowel sounds. EXTREMITIES:  Stump incision was dressed with some bloody drainage on dressing.  REHABILITATION HOSPITAL COURSE:  The patient was admitted to inpatient Rehab Services with therapies initiated on a 3-hour daily basis consisting of physical therapy, occupational therapy, and rehabilitation nursing.  The following issues were addressed during the patient's rehabilitation stay.  Pertaining to Mr. Gamboa right below-knee amputation on December 16, 2015, followed closely by Vascular Surgery noting more mottling and skin breakdown both anterior and now in the mid posterior section.  The patient continued to have moderate pain.  Pain management ongoing as directed with only subtle relief.  It was a feeling of Dr. Donnetta Hutching after discussing at length with the patient, that above-knee amputation was needed, planned for December 26, 2015.  Thus, the patient was discharged from acute rehab services to the acute care side of Vascular Surgery for noted procedure.  He would resume therapies as advised postoperatively after AKA.  During the patient's rehabilitation stay, he was receiving weekly collaborative interdisciplinary team conferences to discuss estimated length of stay, family teaching, and any barriers to discharge.  He performed transfers wheelchair with supervision and cues for safety.  Propels wheelchair to and from therapy gym with supervision, sit-to-stand with cues for  hand placement and ambulating 60 feet minimal guard with supervision assist, negotiating stairs with moderate cues and visual techniques.  He gathered belongings for activities of daily living and homemaking.  He was discharged in guarded condition.  All medication changes made as per Vascular Surgery.     Adam [name], P.A.   ______________________________ Delice Lesch, MD    DA/MEDQ  D:  12/25/2015  T:  12/25/2015  Job:  [id number]  cc:   [name], M.D.

## 2015-12-25 NOTE — Progress Notes (Signed)
Patient ID: Adam [name], male   DOB: Apr 26, 1951, 65 y.o.   MRN: [id number]  continues to have bloody drainage from his right below-knee amputation site. This had more mottling and more skin breakdown both anteriorly and now in the mid posterior section. I do not feel the chance for healing of this. Continues to have moderate pain as well. Discussed this at length with the patient and his wife present. We had discussed moderate risk of nonhealing below-knee amputation with the level of flow that he had with his occluded femoral-popliteal bypass. Explained that the only option in my opinion for his above-knee amputation. He does have a palpable femoral pulse is he feel he has a very high chance of being able to heal this. He understands and wishes to proceed tomorrow. He will be admitted to inpatient for several days and then be reconsidered first rehabilitation inpatient.

## 2015-12-25 NOTE — Progress Notes (Signed)
Placed pt on home cpap, pt tolerating well at this time. RT to monitor as needed

## 2015-12-25 NOTE — Progress Notes (Signed)
Occupational Therapy Session Note  Patient Details  Name: Adam [name] MRN: [id number] Date of Birth: June 12, 1951  Today's Date: 12/25/2015 OT Individual Time: 0930-1030 OT Individual Time Calculation (min): 60 min    Short Term Goals: Week 1:  OT Short Term Goal 1 (Week 1): STG's = LTG's secondary to ELOS  Skilled Therapeutic Interventions/Progress Updates:    Pt resting in w/c upon arrival.  Pt declined bathing and dressing tasks this morning.  Per MD note, patient with be returning to surgery tomorrow for AKA.  Pt propelled w/c to therapy gym and initially engaged in BUE therex on Scifit (12 min X 2, Random, work load 4) with rest break.  Pt transitioned to w/c mobility activities before returning to room.  Pt requested to return to room to take care of some personal business.  Therapy Documentation Precautions:  Precautions Precautions: Fall Precaution Comments: R BKA Required Braces or Orthoses: Other Brace/Splint Other Brace/Splint: post op shoe Rt Restrictions Weight Bearing Restrictions: Yes RLE Weight Bearing: Non weight bearing General: General OT Amount of Missed Time: 15 Minutes   Pain: Pain Assessment Pain Score: 6  Pain Type: Surgical pain Pain Location: Leg Pain Orientation: Right Pain Descriptors / Indicators: Aching;Sharp Pain Onset: With Activity Pain Intervention(s): Rest;Repositioned  See Function Navigator for Current Functional Status.   Therapy/Group: Individual Therapy  Leroy Libman 12/25/2015, 10:41 AM

## 2015-12-25 NOTE — Progress Notes (Addendum)
PHYSICAL MEDICINE & REHABILITATION     PROGRESS NOTE  Subjective/Complaints:  Pt sitting up in bed eating breakfast.  He states Dr. Donnetta Hutching came to see him this morning and will be back to discuss options for his RLE.  He also notes he has been having difficulty having a good BM.    ROS:  +constipation. Denies CP, SOB, n/v/d, phantom pain.   Objective: Vital Signs: Blood pressure 129/69, pulse 64, temperature 98.2 F (36.8 C), temperature source Oral, resp. rate 18, weight 80.604 kg (177 lb 11.2 oz), SpO2 97 %. No results found.  Recent Labs  12/24/15 0857  WBC 11.7*  HGB 10.4*  HCT 30.5*  PLT 331    Recent Labs  12/24/15 0857  NA 134*  K 4.1  CL 94*  GLUCOSE 111*  BUN 13  CREATININE 0.82  CALCIUM 8.8*   CBG (last 3)   Recent Labs  12/24/15 1650 12/24/15 2130 12/25/15 0655  GLUCAP 113* 114* 122*    Wt Readings from Last 3 Encounters:  12/20/15 80.604 kg (177 lb 11.2 oz)  12/15/15 81.4 kg (179 lb 7.3 oz)  12/12/15 82.555 kg (182 lb)    Physical Exam:  BP 129/69 mmHg  Pulse 64  Temp(Src) 98.2 F (36.8 C) (Oral)  Resp 18  Wt 80.604 kg (177 lb 11.2 oz)  SpO2 97% Constitutional: He appears well-developed and well-nourished. NAD.  HENT: Normocephalic and atraumatic.  Eyes: Conjunctivae and EOM are normal.  Cardiovascular: Normal rate, regular rhythm. Murmur heard. Respiratory: Effort normal and breath sounds normal. No respiratory distress. GI: Soft. Bowel sounds are normal. He exhibits no distension.  Musculoskeletal: He exhibits mild edema (RLE) and tenderness (RLE).  Neurological: He is alert and oriented.  Motor: B/l UE, LLE 5/5 RLE: hip flexion 4/5 (pain inhibition)  LLE: 5/5 proximal to distal Skin: Stump with dried blood from medial portion and ecchymosis along anterior aspect. Psychiatric: He has a normal mood and affect. His behavior is normal  Assessment/Plan: 1. Functional deficits secondary to Right BKA,  PVD with profound  ischemic changes which require 3+ hours per day of interdisciplinary therapy in a comprehensive inpatient rehab setting. Physiatrist is providing close team supervision and 24 hour management of active medical problems listed below. Physiatrist and rehab team continue to assess barriers to discharge/monitor patient progress toward functional and medical goals.  Function:  Bathing Bathing position Bathing activity did not occur:  (did not occur during OT eval, will complete next session) Position: Shower  Bathing parts Body parts bathed by patient: Right arm, Left arm, Chest, Abdomen, Front perineal area, Right upper leg, Left upper leg, Left lower leg Body parts bathed by helper: Back  Bathing assist Assist Level: Supervision or verbal cues      Upper Body Dressing/Undressing Upper body dressing Upper body dressing/undressing activity did not occur:  (did not occur during OT eval, will complete next session) What is the patient wearing?: Pull over shirt/dress, Button up shirt     Pull over shirt/dress - Perfomed by patient: Thread/unthread right sleeve, Thread/unthread left sleeve, Put head through opening, Pull shirt over trunk          Upper body assist Assist Level: More than reasonable time   Set up : To obtain clothing/put away  Lower Body Dressing/Undressing Lower body dressing Lower body dressing/undressing activity did not occur:  (Did not occur during OT eval, will complete next session. Pt is able to reach LLE for LB ADLs, however he did loose  balance to left when attempting to don sock. ) What is the patient wearing?: Pants, Socks Underwear - Performed by patient: Thread/unthread left underwear leg, Pull underwear up/down Underwear - Performed by helper: Thread/unthread right underwear leg, Pull underwear up/down Pants- Performed by patient: Thread/unthread right pants leg, Thread/unthread left pants leg, Fasten/unfasten pants, Pull pants up/down Pants- Performed by  helper: Pull pants up/down     Socks - Performed by patient: Don/doff left sock Socks - Performed by helper: Don/doff left sock              Lower body assist Assist for lower body dressing: Touching or steadying assistance (Pt > 75%)      Toileting Toileting Toileting activity did not occur: Safety/medical concerns Toileting steps completed by patient: Performs perineal hygiene Toileting steps completed by helper: Adjust clothing prior to toileting, Adjust clothing after toileting    Toileting assist Assist level:  (per report assist with clothing to use urinal)   Transfers Chair/bed transfer   Chair/bed transfer method: Squat pivot Chair/bed transfer assist level: Touching or steadying assistance (Pt > 75%) Chair/bed transfer assistive device: Armrests     Locomotion Ambulation     Max distance: 70 Assist level: Supervision or verbal cues   Wheelchair   Type: Manual Max wheelchair distance: 100 Assist Level: No help, No cues, assistive device, takes more than reasonable amount of time  Cognition Comprehension Comprehension assist level: Follows basic conversation/direction with extra time/assistive device  Expression Expression assist level: Expresses complex 90% of the time/cues < 10% of the time  Social Interaction Social Interaction assist level: Interacts appropriately with others - No medications needed.  Problem Solving Problem solving assist level: Solves complex problems: With extra time  Memory Memory assist level: Recognizes or recalls 90% of the time/requires cueing < 10% of the time    Medical Problem List and Plan: 1. Right BKA 12/16/2015 secondary to secondary to PVD with profound ischemic changes- tolerating therapy program on current pain med regimen  Encouraged compression to stump and will hold anticoagulation due to bleed.  Will await discussion from pt and Dr. Donnetta Hutching to determine plan for RLE 2. DVT Prophylaxis/Anticoagulation: Subcutaneous  Lovenox (will hold again today due to bleeding from incision).  Monitor platelet counts and any signs of bleeding 3. Pain Management: Celebrex 200 mg twice a day, fentanyl patch 50 g, Neurontin 300 mg twice a day,Oxycodone 30mg  every 4 hours,robaxin as needed. Monitor mental status closely and taper as tolerated in a few days. 4. Hypertension. Hydrochlorothiazide 12.5 mg daily, lisinopril 20 mg daily. Monitor with increased mobility  5. Neuropsych: This patient is capable of making decisions on his own behalf. 6. Skin/Wound Care: Routine skin checks, MAST- zinc/barrier cream to buttocks 7. Fluids/Electrolytes/Nutrition: Routine I&O   Mild Hyponatremia: 134 on 2/6 (stable) - Will cont to monitor 8. Diabetes mellitus of peripheral neuropathy. Hemoglobin A1c 5.7. Check blood sugar before meals and at bedtime. Sliding scale insulin. Patient diet controlled prior to admission   CBG (last 3)   Recent Labs  12/24/15 1650 12/24/15 2130 12/25/15 0655  GLUCAP 113* 114* 122*   9. Hyperlipidemia. Lipitor 10. Leukocytosis:  11.7 on 2/6 (stable) 11. ABLA  Hb 10.4 on 2/6 12. Transaminitis    AST trending down, ALT trending up.  Will hold statin and recheck values on 2/10 13.  Superficial skin infx looks like staph/strp on face and buttocks  Mucpirocin 14. Constipation  Will increase scheduled and PRN bowel meds.  LOS (Days) 5 A  FACE TO FACE EVALUATION WAS PERFORMED  Eudelia Hiltunen Lorie Phenix 12/25/2015 8:40 AM

## 2015-12-26 ENCOUNTER — Other Ambulatory Visit: Payer: Self-pay

## 2015-12-26 ENCOUNTER — Inpatient Hospital Stay (HOSPITAL_COMMUNITY): Payer: Medicare Other | Admitting: Anesthesiology

## 2015-12-26 ENCOUNTER — Inpatient Hospital Stay (HOSPITAL_COMMUNITY): Payer: Medicare Other

## 2015-12-26 ENCOUNTER — Encounter (HOSPITAL_COMMUNITY)
Admission: RE | Disposition: A | Discharge status: Rehabilitation Facility | Payer: Self-pay | Source: Ambulatory Visit | Attending: Vascular Surgery

## 2015-12-26 ENCOUNTER — Inpatient Hospital Stay (HOSPITAL_COMMUNITY)
Admission: RE | Admit: 2015-12-26 | Discharge: 2015-12-29 | DRG: 476 | Disposition: A | Discharge status: Rehabilitation Facility | Payer: Medicare Other | Source: Ambulatory Visit | Attending: Vascular Surgery | Admitting: Vascular Surgery

## 2015-12-26 DIAGNOSIS — Y92199 Unspecified place in other specified residential institution as the place of occurrence of the external cause: Secondary | ICD-10-CM

## 2015-12-26 DIAGNOSIS — Z89511 Acquired absence of right leg below knee: Secondary | ICD-10-CM | POA: Diagnosis not present

## 2015-12-26 DIAGNOSIS — G546 Phantom limb syndrome with pain: Secondary | ICD-10-CM | POA: Diagnosis not present

## 2015-12-26 DIAGNOSIS — I739 Peripheral vascular disease, unspecified: Secondary | ICD-10-CM | POA: Diagnosis not present

## 2015-12-26 DIAGNOSIS — E871 Hypo-osmolality and hyponatremia: Secondary | ICD-10-CM | POA: Diagnosis not present

## 2015-12-26 DIAGNOSIS — T8789 Other complications of amputation stump: Secondary | ICD-10-CM | POA: Diagnosis present

## 2015-12-26 DIAGNOSIS — Z87891 Personal history of nicotine dependence: Secondary | ICD-10-CM

## 2015-12-26 DIAGNOSIS — I96 Gangrene, not elsewhere classified: Secondary | ICD-10-CM | POA: Diagnosis not present

## 2015-12-26 DIAGNOSIS — I10 Essential (primary) hypertension: Secondary | ICD-10-CM | POA: Diagnosis not present

## 2015-12-26 DIAGNOSIS — Z89611 Acquired absence of right leg above knee: Secondary | ICD-10-CM | POA: Diagnosis not present

## 2015-12-26 DIAGNOSIS — R58 Hemorrhage, not elsewhere classified: Secondary | ICD-10-CM | POA: Diagnosis not present

## 2015-12-26 DIAGNOSIS — E1151 Type 2 diabetes mellitus with diabetic peripheral angiopathy without gangrene: Secondary | ICD-10-CM | POA: Diagnosis present

## 2015-12-26 DIAGNOSIS — T8189XA Other complications of procedures, not elsewhere classified, initial encounter: Secondary | ICD-10-CM | POA: Diagnosis not present

## 2015-12-26 DIAGNOSIS — G4733 Obstructive sleep apnea (adult) (pediatric): Secondary | ICD-10-CM | POA: Diagnosis present

## 2015-12-26 DIAGNOSIS — K5903 Drug induced constipation: Secondary | ICD-10-CM

## 2015-12-26 DIAGNOSIS — R269 Unspecified abnormalities of gait and mobility: Secondary | ICD-10-CM | POA: Diagnosis not present

## 2015-12-26 DIAGNOSIS — E1142 Type 2 diabetes mellitus with diabetic polyneuropathy: Secondary | ICD-10-CM | POA: Diagnosis not present

## 2015-12-26 DIAGNOSIS — Y835 Amputation of limb(s) as the cause of abnormal reaction of the patient, or of later complication, without mention of misadventure at the time of the procedure: Secondary | ICD-10-CM | POA: Diagnosis present

## 2015-12-26 DIAGNOSIS — D62 Acute posthemorrhagic anemia: Secondary | ICD-10-CM | POA: Diagnosis not present

## 2015-12-26 DIAGNOSIS — G894 Chronic pain syndrome: Secondary | ICD-10-CM | POA: Diagnosis not present

## 2015-12-26 DIAGNOSIS — Z01818 Encounter for other preprocedural examination: Secondary | ICD-10-CM | POA: Diagnosis not present

## 2015-12-26 DIAGNOSIS — E785 Hyperlipidemia, unspecified: Secondary | ICD-10-CM | POA: Diagnosis not present

## 2015-12-26 DIAGNOSIS — Z4781 Encounter for orthopedic aftercare following surgical amputation: Secondary | ICD-10-CM | POA: Diagnosis not present

## 2015-12-26 HISTORY — DX: Obstructive sleep apnea (adult) (pediatric): G47.33

## 2015-12-26 HISTORY — DX: Dependence on other enabling machines and devices: Z99.89

## 2015-12-26 HISTORY — DX: Peripheral vascular disease, unspecified: I73.9

## 2015-12-26 HISTORY — DX: Type 2 diabetes mellitus without complications: E11.9

## 2015-12-26 HISTORY — PX: AMPUTATION: SHX166

## 2015-12-26 LAB — CBC
HCT: 33.4 % — ABNORMAL LOW (ref 39.0–52.0)
Hemoglobin: 11.1 g/dL — ABNORMAL LOW (ref 13.0–17.0)
MCH: 30.1 pg (ref 26.0–34.0)
MCHC: 33.2 g/dL (ref 30.0–36.0)
MCV: 90.5 fL (ref 78.0–100.0)
PLATELETS: 411 10*3/uL — AB (ref 150–400)
RBC: 3.69 MIL/uL — ABNORMAL LOW (ref 4.22–5.81)
RDW: 12.7 % (ref 11.5–15.5)
WBC: 10.3 10*3/uL (ref 4.0–10.5)

## 2015-12-26 LAB — GLUCOSE, CAPILLARY
Glucose-Capillary: 105 mg/dL — ABNORMAL HIGH (ref 65–99)
Glucose-Capillary: 101 mg/dL — ABNORMAL HIGH (ref 65–99)
Glucose-Capillary: 96 mg/dL (ref 65–99)
Glucose-Capillary: 94 mg/dL (ref 65–99)

## 2015-12-26 LAB — CREATININE, SERUM
Creatinine, Ser: 0.7 mg/dL (ref 0.61–1.24)
GFR calc non Af Amer: >60 mL/min (ref >60)

## 2015-12-26 SURGERY — AMPUTATION, ABOVE KNEE
Anesthesia: General | Site: Leg Upper | Laterality: Right

## 2015-12-26 MED ORDER — ONDANSETRON HCL 4 MG/2ML IJ SOLN
INTRAMUSCULAR | Status: AC
  Filled 2015-12-26: qty 2

## 2015-12-26 MED ORDER — LISINOPRIL 10 MG PO TABS
20.0000 mg | ORAL_TABLET | Freq: Every day | ORAL | Status: DC
  Administered 2015-12-27 – 2015-12-29 (×3): 20 mg via ORAL
  Filled 2015-12-26 (×3): qty 2

## 2015-12-26 MED ORDER — ENOXAPARIN SODIUM 30 MG/0.3ML ~~LOC~~ SOLN
30.0000 mg | SUBCUTANEOUS | Status: DC
  Administered 2015-12-27 – 2015-12-28 (×2): 30 mg via SUBCUTANEOUS
  Filled 2015-12-26 (×2): qty 0.3

## 2015-12-26 MED ORDER — LISINOPRIL-HYDROCHLOROTHIAZIDE 20-12.5 MG PO TABS: 1.0000 | ORAL_TABLET | Freq: Every day | ORAL | Status: DC

## 2015-12-26 MED ORDER — ARTIFICIAL TEARS OP OINT
TOPICAL_OINTMENT | OPHTHALMIC | Status: DC | PRN
  Administered 2015-12-26: 1 via OPHTHALMIC

## 2015-12-26 MED ORDER — SODIUM CHLORIDE 0.9% FLUSH: 9.0000 mL | INTRAVENOUS | Status: DC | PRN

## 2015-12-26 MED ORDER — FENTANYL CITRATE (PF) 100 MCG/2ML IJ SOLN
25.0000 ug | INTRAMUSCULAR | Status: DC | PRN
  Administered 2015-12-26 (×2): 50 ug via INTRAVENOUS

## 2015-12-26 MED ORDER — DOCUSATE SODIUM 100 MG PO CAPS: 100.0000 mg | ORAL_CAPSULE | Freq: Two times a day (BID) | ORAL | Status: DC | PRN

## 2015-12-26 MED ORDER — MAGNESIUM SULFATE 2 GM/50ML IV SOLN: 2.0000 g | Freq: Every day | INTRAVENOUS | Status: DC | PRN

## 2015-12-26 MED ORDER — PROPOFOL 10 MG/ML IV BOLUS
INTRAVENOUS | Status: AC
  Filled 2015-12-26: qty 20

## 2015-12-26 MED ORDER — LIDOCAINE HCL (CARDIAC) 20 MG/ML IV SOLN
INTRAVENOUS | Status: AC
  Filled 2015-12-26: qty 5

## 2015-12-26 MED ORDER — FENTANYL CITRATE (PF) 100 MCG/2ML IJ SOLN
INTRAMUSCULAR | Status: AC
  Administered 2015-12-26: 50 ug via INTRAVENOUS
  Filled 2015-12-26: qty 2

## 2015-12-26 MED ORDER — ACETAMINOPHEN 325 MG PO TABS: 325.0000 mg | ORAL_TABLET | ORAL | Status: DC | PRN

## 2015-12-26 MED ORDER — OXYCODONE HCL 5 MG/5ML PO SOLN: 5.0000 mg | Freq: Once | ORAL | Status: DC | PRN

## 2015-12-26 MED ORDER — BISACODYL 10 MG RE SUPP: 10.0000 mg | Freq: Every day | RECTAL | Status: DC | PRN

## 2015-12-26 MED ORDER — ARTIFICIAL TEARS OP OINT
TOPICAL_OINTMENT | OPHTHALMIC | Status: AC
  Filled 2015-12-26: qty 3.5

## 2015-12-26 MED ORDER — DOCUSATE SODIUM 100 MG PO CAPS
100.0000 mg | ORAL_CAPSULE | Freq: Every day | ORAL | Status: DC
  Administered 2015-12-27 – 2015-12-29 (×3): 100 mg via ORAL
  Filled 2015-12-26 (×3): qty 1

## 2015-12-26 MED ORDER — LIDOCAINE HCL (CARDIAC) 20 MG/ML IV SOLN
INTRAVENOUS | Status: DC | PRN
  Administered 2015-12-26: 30 mg via INTRAVENOUS

## 2015-12-26 MED ORDER — MAGNESIUM HYDROXIDE 400 MG/5ML PO SUSP: 30.0000 mL | Freq: Every day | ORAL | Status: DC | PRN

## 2015-12-26 MED ORDER — POTASSIUM CHLORIDE CRYS ER 20 MEQ PO TBCR: 20.0000 meq | EXTENDED_RELEASE_TABLET | Freq: Every day | ORAL | Status: DC | PRN

## 2015-12-26 MED ORDER — MIDAZOLAM HCL 5 MG/5ML IJ SOLN
INTRAMUSCULAR | Status: DC | PRN
  Administered 2015-12-26: 1 mg via INTRAVENOUS
  Administered 2015-12-26 (×2): 0.5 mg via INTRAVENOUS

## 2015-12-26 MED ORDER — BISACODYL 10 MG RE SUPP
10.0000 mg | Freq: Every day | RECTAL | Status: DC | PRN
Start: 2015-12-26 — End: 2015-12-26
  Administered 2015-12-26: 10 mg via RECTAL
  Filled 2015-12-26: qty 1

## 2015-12-26 MED ORDER — HYDROMORPHONE 1 MG/ML IV SOLN
INTRAVENOUS | Status: DC
  Administered 2015-12-27: 3.8 mg via INTRAVENOUS
  Administered 2015-12-27: 2.1 mg via INTRAVENOUS
  Administered 2015-12-27: 2.4 mg via INTRAVENOUS
  Administered 2015-12-27: 1.2 mg via INTRAVENOUS
  Administered 2015-12-27: 2.4 mg via INTRAVENOUS
  Administered 2015-12-27: 4.2 mg via INTRAVENOUS
  Administered 2015-12-28: 15:00:00 via INTRAVENOUS
  Administered 2015-12-28: 0.3 mg via INTRAVENOUS
  Administered 2015-12-28: 1.2 mg via INTRAVENOUS
  Administered 2015-12-28: 0.6 mg via INTRAVENOUS
  Administered 2015-12-28: 1.5 mg via INTRAVENOUS
  Administered 2015-12-28: 0.6 mg via INTRAVENOUS
  Filled 2015-12-26 (×2): qty 25

## 2015-12-26 MED ORDER — GUAIFENESIN-DM 100-10 MG/5ML PO SYRP: 15.0000 mL | ORAL_SOLUTION | ORAL | Status: DC | PRN

## 2015-12-26 MED ORDER — HYDRALAZINE HCL 20 MG/ML IJ SOLN: 5.0000 mg | INTRAMUSCULAR | Status: DC | PRN

## 2015-12-26 MED ORDER — OXYCODONE HCL 5 MG PO TABS
15.0000 mg | ORAL_TABLET | ORAL | Status: DC | PRN
  Administered 2015-12-26 – 2015-12-29 (×12): 15 mg via ORAL
  Filled 2015-12-26 (×12): qty 3

## 2015-12-26 MED ORDER — FENTANYL CITRATE (PF) 250 MCG/5ML IJ SOLN
INTRAMUSCULAR | Status: AC
  Filled 2015-12-26: qty 5

## 2015-12-26 MED ORDER — HYDROCHLOROTHIAZIDE 12.5 MG PO CAPS
12.5000 mg | ORAL_CAPSULE | Freq: Every day | ORAL | Status: DC
  Administered 2015-12-27 – 2015-12-29 (×3): 12.5 mg via ORAL
  Filled 2015-12-26 (×3): qty 1

## 2015-12-26 MED ORDER — PROPOFOL 10 MG/ML IV BOLUS
INTRAVENOUS | Status: DC | PRN
  Administered 2015-12-26: 170 mg via INTRAVENOUS
  Administered 2015-12-26: 30 mg via INTRAVENOUS

## 2015-12-26 MED ORDER — OXYCODONE HCL 5 MG PO TABS: 5.0000 mg | ORAL_TABLET | Freq: Once | ORAL | Status: DC | PRN

## 2015-12-26 MED ORDER — PANTOPRAZOLE SODIUM 40 MG PO TBEC
40.0000 mg | DELAYED_RELEASE_TABLET | Freq: Every day | ORAL | Status: DC
  Administered 2015-12-26 – 2015-12-29 (×4): 40 mg via ORAL
  Filled 2015-12-26 (×4): qty 1

## 2015-12-26 MED ORDER — DIPHENHYDRAMINE HCL 50 MG/ML IJ SOLN: 12.5000 mg | Freq: Four times a day (QID) | INTRAMUSCULAR | Status: DC | PRN

## 2015-12-26 MED ORDER — MIDAZOLAM HCL 2 MG/2ML IJ SOLN
1.0000 mg | Freq: Once | INTRAMUSCULAR | Status: AC
  Administered 2015-12-26: 2 mg via INTRAVENOUS

## 2015-12-26 MED ORDER — LABETALOL HCL 5 MG/ML IV SOLN
10.0000 mg | INTRAVENOUS | Status: DC | PRN
  Administered 2015-12-27: 10 mg via INTRAVENOUS
  Filled 2015-12-26: qty 4

## 2015-12-26 MED ORDER — LACTATED RINGERS IV SOLN
INTRAVENOUS | Status: DC | PRN
  Administered 2015-12-26: 13:00:00 via INTRAVENOUS

## 2015-12-26 MED ORDER — DIPHENHYDRAMINE HCL 12.5 MG/5ML PO ELIX: 12.5000 mg | ORAL_SOLUTION | Freq: Four times a day (QID) | ORAL | Status: DC | PRN

## 2015-12-26 MED ORDER — ONDANSETRON HCL 4 MG/2ML IJ SOLN: 4.0000 mg | Freq: Four times a day (QID) | INTRAMUSCULAR | Status: DC | PRN

## 2015-12-26 MED ORDER — ATORVASTATIN CALCIUM 10 MG PO TABS
10.0000 mg | ORAL_TABLET | ORAL | Status: DC
  Administered 2015-12-28: 10 mg via ORAL
  Filled 2015-12-26 (×2): qty 1

## 2015-12-26 MED ORDER — ADULT MULTIVITAMIN W/MINERALS CH
1.0000 | ORAL_TABLET | Freq: Every day | ORAL | Status: DC
  Administered 2015-12-27 – 2015-12-29 (×3): 1 via ORAL
  Filled 2015-12-26 (×3): qty 1

## 2015-12-26 MED ORDER — ADULT MULTIVITAMIN W/MINERALS CH: 1.0000 | ORAL_TABLET | Freq: Every day | ORAL | Status: DC

## 2015-12-26 MED ORDER — MIDAZOLAM HCL 2 MG/2ML IJ SOLN
INTRAMUSCULAR | Status: AC
  Filled 2015-12-26: qty 2

## 2015-12-26 MED ORDER — SODIUM CHLORIDE 0.9 % IV SOLN
INTRAVENOUS | Status: DC
  Administered 2015-12-26 – 2015-12-27 (×2): via INTRAVENOUS

## 2015-12-26 MED ORDER — ONDANSETRON HCL 4 MG/2ML IJ SOLN
INTRAMUSCULAR | Status: DC | PRN
  Administered 2015-12-26: 4 mg via INTRAVENOUS

## 2015-12-26 MED ORDER — ONDANSETRON HCL 4 MG/2ML IJ SOLN: 4.0000 mg | Freq: Once | INTRAMUSCULAR | Status: DC | PRN

## 2015-12-26 MED ORDER — FENTANYL CITRATE (PF) 100 MCG/2ML IJ SOLN
INTRAMUSCULAR | Status: DC | PRN
  Administered 2015-12-26 (×3): 50 ug via INTRAVENOUS
  Administered 2015-12-26: 125 ug via INTRAVENOUS
  Administered 2015-12-26: 25 ug via INTRAVENOUS
  Administered 2015-12-26 (×3): 50 ug via INTRAVENOUS

## 2015-12-26 MED ORDER — MORPHINE SULFATE (PF) 2 MG/ML IV SOLN
2.0000 mg | INTRAVENOUS | Status: DC | PRN
  Administered 2015-12-26: 4 mg via INTRAVENOUS
  Filled 2015-12-26: qty 2

## 2015-12-26 MED ORDER — 0.9 % SODIUM CHLORIDE (POUR BTL) OPTIME
TOPICAL | Status: DC | PRN
  Administered 2015-12-26: 1000 mL

## 2015-12-26 MED ORDER — DEXTROSE 5 % IV SOLN
1.5000 g | Freq: Two times a day (BID) | INTRAVENOUS | Status: AC
  Administered 2015-12-27 (×2): 1.5 g via INTRAVENOUS
  Filled 2015-12-26 (×2): qty 1.5

## 2015-12-26 MED ORDER — MIDAZOLAM HCL 2 MG/2ML IJ SOLN
INTRAMUSCULAR | Status: AC
  Administered 2015-12-26: 2 mg via INTRAVENOUS
  Filled 2015-12-26: qty 2

## 2015-12-26 MED ORDER — PHENOL 1.4 % MT LIQD: 1.0000 | OROMUCOSAL | Status: DC | PRN

## 2015-12-26 MED ORDER — NALOXONE HCL 0.4 MG/ML IJ SOLN: 0.4000 mg | INTRAMUSCULAR | Status: DC | PRN

## 2015-12-26 MED ORDER — ACETAMINOPHEN 650 MG RE SUPP: 325.0000 mg | RECTAL | Status: DC | PRN

## 2015-12-26 MED ORDER — OXYCODONE-ACETAMINOPHEN 5-325 MG PO TABS: 1.0000 | ORAL_TABLET | Freq: Four times a day (QID) | ORAL | Status: DC | PRN

## 2015-12-26 MED ORDER — ALUM & MAG HYDROXIDE-SIMETH 200-200-20 MG/5ML PO SUSP: 15.0000 mL | ORAL | Status: DC | PRN

## 2015-12-26 MED ORDER — METOPROLOL TARTRATE 1 MG/ML IV SOLN: 2.0000 mg | INTRAVENOUS | Status: DC | PRN

## 2015-12-26 MED ORDER — ASPIRIN 81 MG PO CHEW
81.0000 mg | CHEWABLE_TABLET | Freq: Every day | ORAL | Status: DC
  Administered 2015-12-27 – 2015-12-29 (×3): 81 mg via ORAL
  Filled 2015-12-26 (×3): qty 1

## 2015-12-26 SURGICAL SUPPLY — 44 items
BANDAGE ELASTIC 4 VELCRO ST LF (GAUZE/BANDAGES/DRESSINGS) ×2 IMPLANT
BANDAGE ELASTIC 6 VELCRO ST LF (GAUZE/BANDAGES/DRESSINGS) ×2 IMPLANT
BANDAGE ESMARK 6X9 LF (GAUZE/BANDAGES/DRESSINGS) IMPLANT
BNDG CMPR 9X6 STRL LF SNTH (GAUZE/BANDAGES/DRESSINGS)
BNDG ESMARK 6X9 LF (GAUZE/BANDAGES/DRESSINGS)
BNDG GAUZE ELAST 4 BULKY (GAUZE/BANDAGES/DRESSINGS) ×3 IMPLANT
CANISTER SUCTION 2500CC (MISCELLANEOUS) ×2 IMPLANT
CLIP LIGATING EXTRA MED SLVR (CLIP) ×2 IMPLANT
CLIP LIGATING EXTRA SM BLUE (MISCELLANEOUS) ×2 IMPLANT
COVER SURGICAL LIGHT HANDLE (MISCELLANEOUS) ×4 IMPLANT
CUFF TOURNIQUET SINGLE 34IN LL (TOURNIQUET CUFF) IMPLANT
CUFF TOURNIQUET SINGLE 44IN (TOURNIQUET CUFF) IMPLANT
DRAIN SNY 10X20 3/4 PERF (WOUND CARE) IMPLANT
DRAPE ORTHO SPLIT 77X108 STRL (DRAPES) ×4
DRAPE PROXIMA HALF (DRAPES) ×2 IMPLANT
DRAPE SURG ORHT 6 SPLT 77X108 (DRAPES) ×2 IMPLANT
DRESSING ALLEVYN LIFE SACRUM (GAUZE/BANDAGES/DRESSINGS) ×1 IMPLANT
DRSG ADAPTIC 3X8 NADH LF (GAUZE/BANDAGES/DRESSINGS) ×1 IMPLANT
DRSG PAD ABDOMINAL 8X10 ST (GAUZE/BANDAGES/DRESSINGS) ×1 IMPLANT
ELECT REM PT RETURN 9FT ADLT (ELECTROSURGICAL) ×2
ELECTRODE REM PT RTRN 9FT ADLT (ELECTROSURGICAL) ×1 IMPLANT
EVACUATOR SILICONE 100CC (DRAIN) IMPLANT
GAUZE SPONGE 4X4 12PLY STRL (GAUZE/BANDAGES/DRESSINGS) ×4 IMPLANT
GAUZE XEROFORM 5X9 LF (GAUZE/BANDAGES/DRESSINGS) ×2 IMPLANT
GLOVE SS BIOGEL STRL SZ 7.5 (GLOVE) ×1 IMPLANT
GLOVE SUPERSENSE BIOGEL SZ 7.5 (GLOVE) ×1
GOWN STRL REUS W/ TWL LRG LVL3 (GOWN DISPOSABLE) ×3 IMPLANT
GOWN STRL REUS W/TWL LRG LVL3 (GOWN DISPOSABLE) ×6
KIT BASIN OR (CUSTOM PROCEDURE TRAY) ×2 IMPLANT
KIT ROOM TURNOVER OR (KITS) ×2 IMPLANT
NS IRRIG 1000ML POUR BTL (IV SOLUTION) ×2 IMPLANT
PACK GENERAL/GYN (CUSTOM PROCEDURE TRAY) ×2 IMPLANT
PAD ARMBOARD 7.5X6 YLW CONV (MISCELLANEOUS) ×4 IMPLANT
PADDING CAST COTTON 6X4 STRL (CAST SUPPLIES) IMPLANT
SAW GIGLI STERILE 20 (MISCELLANEOUS) ×2 IMPLANT
SPONGE GAUZE 4X4 12PLY STER LF (GAUZE/BANDAGES/DRESSINGS) ×1 IMPLANT
STAPLER VISISTAT 35W (STAPLE) ×2 IMPLANT
STOCKINETTE IMPERVIOUS LG (DRAPES) ×2 IMPLANT
SUT ETHILON 3 0 PS 1 (SUTURE) IMPLANT
SUT VIC AB 0 CT1 18XCR BRD 8 (SUTURE) ×2 IMPLANT
SUT VIC AB 0 CT1 8-18 (SUTURE) ×4
SUT VICRYL AB 2 0 TIES (SUTURE) ×2 IMPLANT
UNDERPAD 30X30 INCONTINENT (UNDERPADS AND DIAPERS) ×2 IMPLANT
WATER STERILE IRR 1000ML POUR (IV SOLUTION) ×2 IMPLANT

## 2015-12-26 NOTE — H&P (View-Only) (Signed)
Patient ID: Adam [name], male   DOB: [birth year], 65 y.o.   MRN: [id number]  continues to have bloody drainage from his right below-knee amputation site. This had more mottling and more skin breakdown both anteriorly and now in the mid posterior section. I do not feel the chance for healing of this. Continues to have moderate pain as well. Discussed this at length with the patient and his wife present. We had discussed moderate risk of nonhealing below-knee amputation with the level of flow that he had with his occluded femoral-popliteal bypass. Explained that the only option in my opinion for his above-knee amputation. He does have a palpable femoral pulse is he feel he has a very high chance of being able to heal this. He understands and wishes to proceed tomorrow. He will be admitted to inpatient for several days and then be reconsidered first rehabilitation inpatient.

## 2015-12-26 NOTE — Interval H&P Note (Signed)
History and Physical Interval Note:  12/26/2015 12:46 PM  Adam [name]  has presented today for surgery, with the diagnosis of Nonhealing right below knee amputation  T81.89A  The various methods of treatment have been discussed with the patient and family. After consideration of risks, benefits and other options for treatment, the patient has consented to  Procedure(s): AMPUTATION ABOVE KNEE (Right) as a surgical intervention .  The patient's history has been reviewed, patient examined, no change in status, stable for surgery.  I have reviewed the patient's chart and labs.  Questions were answered to the patient's satisfaction.     [name]

## 2015-12-26 NOTE — Anesthesia Preprocedure Evaluation (Addendum)
Anesthesia Evaluation  Patient identified by MRN, date of birth, ID band Patient awake    Reviewed: Allergy & Precautions, NPO status , Patient's Chart, lab work & pertinent test results  Airway Mallampati: II  TM Distance: >3 FB Neck ROM: Full    Dental  (+) Teeth Intact, Missing, Dental Advisory Given   Pulmonary sleep apnea and Continuous Positive Airway Pressure Ventilation , former smoker,    breath sounds clear to auscultation       Cardiovascular hypertension, Pt. on medications  Rhythm:Regular Rate:Normal     Neuro/Psych    GI/Hepatic   Endo/Other  diabetes, Well Controlled, Type 2  Renal/GU      Musculoskeletal   Abdominal   Peds  Hematology   Anesthesia Other Findings   Reproductive/Obstetrics                            Anesthesia Physical Anesthesia Plan  ASA: III  Anesthesia Plan: General   Post-op Pain Management:    Induction: Intravenous  Airway Management Planned: LMA  Additional Equipment:   Intra-op Plan:   Post-operative Plan:   Informed Consent: I have reviewed the patients History and Physical, chart, labs and discussed the procedure including the risks, benefits and alternatives for the proposed anesthesia with the patient or authorized representative who has indicated his/her understanding and acceptance.   Dental advisory given  Plan Discussed with: CRNA and Anesthesiologist  Anesthesia Plan Comments:         Anesthesia Quick Evaluation

## 2015-12-26 NOTE — Transfer of Care (Signed)
Immediate Anesthesia Transfer of Care Note  Patient: Adam [name]  Procedure(s) Performed: Procedure(s): RIGHT ABOVE KNEE AMPUTATION (Right)  Patient Location: PACU  Anesthesia Type:General  Level of Consciousness: awake, alert , oriented and patient cooperative  Airway & Oxygen Therapy: Patient Spontanous Breathing and Patient connected to nasal cannula oxygen  Post-op Assessment: Report given to RN, Post -op Vital signs reviewed and stable and Patient moving all extremities  Post vital signs: Reviewed and stable  Last Vitals:  Filed Vitals:   12/26/15 1528  BP: 144/76  Pulse: 82  Temp: 36.7 C  Resp: 12    Complications: No apparent anesthesia complications

## 2015-12-26 NOTE — Op Note (Signed)
    OPERATIVE REPORT  DATE OF SURGERY: 12/26/2015  PATIENT: Adam [name], 65 y.o. male MRN: [id number]  DOB: [birth year]  PRE-OPERATIVE DIAGNOSIS: Nonhealing right below-knee amputation  POST-OPERATIVE DIAGNOSIS:  Same  PROCEDURE: Right above-knee amputation  SURGEON:  [name], M.D.  PHYSICIAN ASSISTANT: Nurse  ANESTHESIA:  Gen.  EBL: 100 ml  Total I/O In: 800 [I.V.:800] Out: 150 [Blood:150]  BLOOD ADMINISTERED: None  DRAINS: None  SPECIMEN: right above-knee amputation  COUNTS CORRECT:  YES  PLAN OF CARE: PACU   PATIENT DISPOSITION:  PACU - hemodynamically stable  PROCEDURE DETAILS: Patient is brought today status post wound amputation with a profoundly ischemic foot. He has progressive ischemic changes and again having drainage and breakdown of his below-knee amputation. He is now taking operative for conversion to above-the-knee amputation. A fishmouth incision was made on the distal thigh just above the knee and carried down through the fascia with electrocautery. The muscle bodies were divided in line with the skin incision. The superficial femoral artificial murmur vein were occluded or ligated with 2-0 Vicryl ties and divided. Saphenous vein was thrombosed ligated with 0 Vicryl and divided as well. The nerve was then ligated with 0 Vicryl tie and divided as well. The periosteum was elevated off the femur and the femur was divided with a Gigli saw several several centimeters above the skin incision to not as tension on the end of the dictation. The edges of the femur were smoothed with a bone rasp. It was saline and hemostasis was obtained with electrocautery. Theanterior fascia was closed to the posterior fascia with interrupted 0 Vicryl figure-of-eight sutures. Skin was closed with skin clips. A iodoform gauze and fluffed 4 x 4's Kerlix and 6N Chase were placed over the amputation. Patient was transferred to the recovery room in stable condition   [name],  M.D. 12/26/2015 3:59 PM

## 2015-12-26 NOTE — Anesthesia Postprocedure Evaluation (Signed)
Anesthesia Post Note  Patient: Adam [name]  Procedure(s) Performed: Procedure(s) (LRB): RIGHT ABOVE KNEE AMPUTATION (Right)  Patient location during evaluation: PACU Anesthesia Type: General Level of consciousness: awake and awake and alert Pain management: pain level controlled Vital Signs Assessment: post-procedure vital signs reviewed and stable Respiratory status: spontaneous breathing and nonlabored ventilation Cardiovascular status: blood pressure returned to baseline Anesthetic complications: no    Last Vitals:  Filed Vitals:   12/26/15 1912 12/26/15 1947  BP:    Pulse:    Temp:  37 C  Resp: 13     Last Pain:  Filed Vitals:   12/26/15 1948  PainSc: 10-Worst pain ever                 Frazer [place] COKER

## 2015-12-26 NOTE — Anesthesia Procedure Notes (Signed)
Procedure Name: LMA Insertion Date/Time: 12/26/2015 1:42 PM Performed by: Williemae Area B Pre-anesthesia Checklist: Patient identified, Emergency Drugs available, Suction available and Patient being monitored Patient Re-evaluated:Patient Re-evaluated prior to inductionOxygen Delivery Method: Circle system utilized Preoxygenation: Pre-oxygenation with 100% oxygen Intubation Type: IV induction Ventilation: Mask ventilation without difficulty LMA: LMA inserted LMA Size: 5.0 Number of attempts: 1 Placement Confirmation: positive ETCO2 and breath sounds checked- equal and bilateral Tube secured with: Tape (taped across cheeks) Dental Injury: Teeth and Oropharynx as per pre-operative assessment

## 2015-12-26 NOTE — Progress Notes (Signed)
[facility] PHYSICAL MEDICINE & REHABILITATION     PROGRESS NOTE  Subjective/Complaints:  Pt slept well overnight.  He is scheduled to have his BKA revised to an AKA today.     ROS:  +constipation, stump pain. Denies CP, SOB, n/v/d, phantom pain.   Objective: Vital Signs: Blood pressure 133/60, pulse 72, temperature 97.7 F (36.5 C), temperature source Oral, resp. rate 18, weight 80.604 kg (177 lb 11.2 oz), SpO2 96 %. Dg Chest Port 1 View  12/26/2015  CLINICAL DATA:  Preoperative examination prior foot surgery, no current chest complaints, former smoker. EXAM: PORTABLE CHEST 1 VIEW COMPARISON:  None in PACs FINDINGS: The lungs are adequately inflated and clear. The heart and pulmonary vascularity are normal. The mediastinum is normal in width. The bony thorax is unremarkable. IMPRESSION: There is no active cardiopulmonary disease. Electronically Signed   By: David  Martinique M.D.   On: 12/26/2015 07:49    Recent Labs  12/24/15 0857  WBC 11.7*  HGB 10.4*  HCT 30.5*  PLT 331    Recent Labs  12/24/15 0857  NA 134*  K 4.1  CL 94*  GLUCOSE 111*  BUN 13  CREATININE 0.82  CALCIUM 8.8*   CBG (last 3)   Recent Labs  12/25/15 1728 12/25/15 2118 12/26/15 0642  GLUCAP 109* 110* 105*    Wt Readings from Last 3 Encounters:  12/20/15 80.604 kg (177 lb 11.2 oz)  12/15/15 81.4 kg (179 lb 7.3 oz)  12/12/15 82.555 kg (182 lb)    Physical Exam:  BP 133/60 mmHg  Pulse 72  Temp(Src) 97.7 F (36.5 C) (Oral)  Resp 18  Wt 80.604 kg (177 lb 11.2 oz)  SpO2 96% Constitutional: He appears well-developed and well-nourished. NAD.  HENT: Normocephalic and atraumatic.  Eyes: Conjunctivae and EOM are normal.  Cardiovascular: Normal rate, regular rhythm.  Respiratory: Effort normal and breath sounds normal. No respiratory distress. GI: Soft. Bowel sounds are normal. He exhibits no distension.  Musculoskeletal: He exhibits mild edema (RLE) and tenderness (RLE).  Neurological: He is  alert and oriented.  Motor: B/l UE, LLE 5/5 RLE: hip flexion 4/5 (pain inhibition)  LLE: 5/5 proximal to distal Skin: Stump with dressing c/d/i. Psychiatric: He has a normal mood and affect. His behavior is normal  Assessment/Plan: 1. Functional deficits secondary to Right BKA,  PVD with profound ischemic changes which require 3+ hours per day of interdisciplinary therapy in a comprehensive inpatient rehab setting. Physiatrist is providing close team supervision and 24 hour management of active medical problems listed below. Physiatrist and rehab team continue to assess barriers to discharge/monitor patient progress toward functional and medical goals.  Function:  Bathing Bathing position Bathing activity did not occur:  (did not occur during OT eval, will complete next session) Position: Shower  Bathing parts Body parts bathed by patient: Right arm, Left arm, Chest, Abdomen, Front perineal area, Right upper leg, Left upper leg, Left lower leg Body parts bathed by helper: Back  Bathing assist Assist Level: Supervision or verbal cues      Upper Body Dressing/Undressing Upper body dressing Upper body dressing/undressing activity did not occur:  (did not occur during OT eval, will complete next session) What is the patient wearing?: Pull over shirt/dress, Button up shirt     Pull over shirt/dress - Perfomed by patient: Thread/unthread right sleeve, Thread/unthread left sleeve, Put head through opening, Pull shirt over trunk          Upper body assist Assist Level: More than reasonable  time   Set up : To obtain clothing/put away  Lower Body Dressing/Undressing Lower body dressing Lower body dressing/undressing activity did not occur:  (Did not occur during OT eval, will complete next session. Pt is able to reach LLE for LB ADLs, however he did loose balance to left when attempting to don sock. ) What is the patient wearing?: Pants, Socks Underwear - Performed by patient:  Thread/unthread left underwear leg, Pull underwear up/down Underwear - Performed by helper: Thread/unthread right underwear leg, Pull underwear up/down Pants- Performed by patient: Thread/unthread right pants leg, Thread/unthread left pants leg, Fasten/unfasten pants, Pull pants up/down Pants- Performed by helper: Pull pants up/down     Socks - Performed by patient: Don/doff left sock Socks - Performed by helper: Don/doff left sock              Lower body assist Assist for lower body dressing: Touching or steadying assistance (Pt > 75%)      Toileting Toileting Toileting activity did not occur: Safety/medical concerns Toileting steps completed by patient: Performs perineal hygiene Toileting steps completed by helper: Adjust clothing prior to toileting, Adjust clothing after toileting    Toileting assist Assist level:  (per report assist with clothing to use urinal)   Transfers Chair/bed transfer   Chair/bed transfer method: Stand pivot, Squat pivot Chair/bed transfer assist level: Supervision or verbal cues Chair/bed transfer assistive device: Armrests, Medical sales representative     Max distance: 60 Assist level: Touching or steadying assistance (Pt > 75%)   Wheelchair   Type: Manual Max wheelchair distance: >300 Assist Level: No help, No cues, assistive device, takes more than reasonable amount of time  Cognition Comprehension Comprehension assist level: Follows basic conversation/direction with extra time/assistive device  Expression Expression assist level: Expresses complex 90% of the time/cues < 10% of the time  Social Interaction Social Interaction assist level: Interacts appropriately with others - No medications needed.  Problem Solving Problem solving assist level: Solves complex problems: With extra time  Memory Memory assist level: Recognizes or recalls 90% of the time/requires cueing < 10% of the time    Medical Problem List and Plan: 1. Right BKA  12/16/2015 secondary to secondary to PVD with profound ischemic changes- tolerating therapy program on current pain med regimen  Encouraged compression to stump and will hold anticoagulation due to bleed.  Pt to have AKA today 2. DVT Prophylaxis/Anticoagulation: Subcutaneous Lovenox d/ced due to bleeding from incision.   3. Pain Management: Celebrex 200 mg twice a day, fentanyl patch 50 g, Neurontin 300 mg twice a day,Oxycodone 30mg  every 4 hours,robaxin as needed. Monitor mental status closely and taper as tolerated in a few days. 4. Hypertension. Hydrochlorothiazide 12.5 mg daily, lisinopril 20 mg daily. Monitor with increased mobility  5. Neuropsych: This patient is capable of making decisions on his own behalf. 6. Skin/Wound Care: Routine skin checks, MAST- zinc/barrier cream to buttocks 7. Fluids/Electrolytes/Nutrition: Routine I&O   Mild Hyponatremia: 134 on 2/6 (stable) - cont to monitor 8. Diabetes mellitus of peripheral neuropathy. Hemoglobin A1c 5.7. Check blood sugar before meals and at bedtime. Sliding scale insulin. Patient diet controlled prior to admission   CBG (last 3)   Recent Labs  12/25/15 1728 12/25/15 2118 12/26/15 0642  GLUCAP 109* 110* 105*   9. Hyperlipidemia. Lipitor 10. Leukocytosis:  11.7 on 2/6 (stable) 11. ABLA  Hb 10.4 on 2/6 12. Transaminitis    AST trending down, ALT trending up.  Statin held, consider rechecking values  13.  Superficial skin infx looks like staph/strp on face and buttocks  Mucpirocin 14. Constipation  Increase scheduled and PRN bowel meds again today.  LOS (Days) 6 A FACE TO FACE EVALUATION WAS PERFORMED  Ankit Lorie Phenix 12/26/2015 8:43 AM

## 2015-12-26 NOTE — Progress Notes (Signed)
Patient admitted from PACU around 1730H in severe pain. Medicated with morphine with no relief, BP elevated at 170's SBP. Called and notified Dr. Donnetta Hutching. Verbal order received to start with PCA dilaudid in high dose. Notified the patient.

## 2015-12-26 NOTE — Progress Notes (Signed)
Social Work  Discharge Note  The overall goal for the admission was met for:   Discharge location: No - pt transferred to acute for surgery  Length of Stay: No - tf to acute  Discharge activity level: No - pt had not yet met mod i goals, however, was making gains and at an overall supervision/ min assist level at time of transfer.  Of note, he did exhibit some poor safety awareness at times which were also of concern.  Home/community participation: No  Services provided included: MD, RD, PT, OT, RN, TR, Pharmacy and SW  Financial Services: Private Insurance: [employer]  Follow-up services arranged: NA  Comments (or additional information):  Patient/Family verbalized understanding of follow-up arrangements: NA  Individual responsible for coordination of the follow-up plan:  NA  Confirmed correct DME delivered: NA    Adam [name]

## 2015-12-27 ENCOUNTER — Encounter (HOSPITAL_COMMUNITY): Payer: Self-pay | Admitting: Vascular Surgery

## 2015-12-27 LAB — CBC
HCT: 34 % — ABNORMAL LOW (ref 39.0–52.0)
Hemoglobin: 11.5 g/dL — ABNORMAL LOW (ref 13.0–17.0)
MCH: 30.7 pg (ref 26.0–34.0)
MCHC: 33.8 g/dL (ref 30.0–36.0)
MCV: 90.7 fL (ref 78.0–100.0)
PLATELETS: 479 10*3/uL — AB (ref 150–400)
RBC: 3.75 MIL/uL — AB (ref 4.22–5.81)
RDW: 12.5 % (ref 11.5–15.5)
WBC: 12.4 10*3/uL — AB (ref 4.0–10.5)

## 2015-12-27 LAB — BASIC METABOLIC PANEL
Anion gap: 9 (ref 5–15)
BUN: 12 mg/dL (ref 6–20)
CALCIUM: 8.9 mg/dL (ref 8.9–10.3)
CO2: 27 mmol/L (ref 22–32)
CREATININE: 0.65 mg/dL (ref 0.61–1.24)
Chloride: 101 mmol/L (ref 101–111)
GFR calc non Af Amer: >60 mL/min (ref >60)
Glucose, Bld: 95 mg/dL (ref 65–99)
Potassium: 4.9 mmol/L (ref 3.5–5.1)
SODIUM: 137 mmol/L (ref 135–145)

## 2015-12-27 LAB — GLUCOSE, CAPILLARY: Glucose-Capillary: 93 mg/dL (ref 65–99)

## 2015-12-27 MED ORDER — FENTANYL 50 MCG/HR TD PT72
50.0000 ug | MEDICATED_PATCH | TRANSDERMAL | Status: DC
  Administered 2015-12-28: 50 ug via TRANSDERMAL
  Filled 2015-12-27: qty 1

## 2015-12-27 MED ORDER — CYCLOBENZAPRINE HCL 10 MG PO TABS
5.0000 mg | ORAL_TABLET | Freq: Three times a day (TID) | ORAL | Status: DC | PRN
  Administered 2015-12-28 (×3): 5 mg via ORAL
  Filled 2015-12-27 (×3): qty 1

## 2015-12-27 MED ORDER — CELECOXIB 200 MG PO CAPS
200.0000 mg | ORAL_CAPSULE | Freq: Two times a day (BID) | ORAL | Status: DC
  Administered 2015-12-27 – 2015-12-29 (×5): 200 mg via ORAL
  Filled 2015-12-27 (×6): qty 1

## 2015-12-27 MED ORDER — DIAZEPAM 5 MG/ML IJ SOLN
5.0000 mg | Freq: Three times a day (TID) | INTRAMUSCULAR | Status: DC | PRN
  Administered 2015-12-27 – 2015-12-28 (×3): 5 mg via INTRAVENOUS
  Filled 2015-12-27 (×3): qty 2

## 2015-12-27 NOTE — Evaluation (Signed)
Physical Therapy Evaluation Patient Details Name: Adam [name] MRN: [id number] DOB: December 28, 1950 Today's Date: 12/27/2015   History of Present Illness  Pt admitted for R AKA from CIR. 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  Clinical Impression  Patient presents with pain, post surgical deficits, decreased balance and decreased mobility s/p above surgery. Pt tolerated transferring to chair with Min A for balance/safety. Pain limiting ambulation and mobility today. Encouraged exercises to improve AROM right residual limb. Discussed positioning. Recommend return to rehab so pt can maximize independence and mobility prior to return home with wife.    Follow Up Recommendations CIR    Equipment Recommendations  None recommended by PT    Recommendations for Other Services OT consult     Precautions / Restrictions Precautions Precautions: Fall Restrictions Weight Bearing Restrictions: No      Mobility  Bed Mobility Overal bed mobility: Needs Assistance Bed Mobility: Supine to Sit     Supine to sit: Min guard;HOB elevated     General bed mobility comments: Increased time, good use of UEs to scoot to EOB. No dizziness.  Transfers Overall transfer level: Needs assistance Equipment used: Rolling walker (2 wheeled) Transfers: Sit to/from Stand Sit to Stand: Min assist;+2 safety/equipment Stand pivot transfers: Min assist;+2 safety/equipment       General transfer comment: cues for hand placement, min assist to steady. SPT bed to chair with Min A for balance. Not able to extend right residual limb to neutral despite cues. Pain limiting.  Ambulation/Gait                Stairs            Wheelchair Mobility    Modified Rankin (Stroke Patients Only)       Balance Overall balance assessment: Needs assistance Sitting-balance support: Feet supported;Bilateral upper extremity supported Sitting  balance-Leahy Scale: Fair     Standing balance support: During functional activity Standing balance-Leahy Scale: Poor Standing balance comment: Reliant on UEs for support.                             Pertinent Vitals/Pain Pain Assessment: Faces Faces Pain Scale: Hurts even more Pain Location: right residual limb Pain Descriptors / Indicators: Guarding;Grimacing Pain Intervention(s): Limited activity within patient's tolerance;Monitored during session;PCA encouraged;Repositioned;Premedicated before session    Home Living Family/patient expects to be discharged to:: Private residence Living Arrangements: Spouse/significant other Available Help at Discharge: Family;Available 24 hours/day Type of Home: House Home Access: Stairs to enter Entrance Stairs-Rails: None Entrance Stairs-Number of Steps: 4 from garage Home Layout: Two level;Bed/bath upstairs;1/2 bath on main level Home Equipment: Walker - 2 wheels;Crutches;Shower seat - built in Additional Comments: Pt has 2 steps in garage w/o rails, has 3 steps in front of the house w/ Lt and Rt rails, and has 1 flight to get upstairs to his bedroom w/ Lt rail    Prior Function Level of Independence: Independent with assistive device(s)               Hand Dominance   Dominant Hand: Right    Extremity/Trunk Assessment   Upper Extremity Assessment: Defer to OT evaluation           Lower Extremity Assessment: Defer to PT evaluation RLE Deficits / Details: Positioned in hip flexion due to tight hip flexors; limited AROM secondary to pain. Cues for glute and hamstring activation,  able to improve right residual limb to almost neutral.    Cervical / Trunk Assessment: Normal  Communication   Communication: No difficulties  Cognition Arousal/Alertness: Awake/alert Behavior During Therapy: WFL for tasks assessed/performed Overall Cognitive Status: Within Functional Limits for tasks assessed                       General Comments General comments (skin integrity, edema, etc.): Discussed importance of positioning of right residual limb and exercises. Pt reports not getting any sleep last night and very tired.    Exercises Amputee Exercises Hip Extension: Right;5 reps;Seated      Assessment/Plan    PT Assessment Patient needs continued PT services  PT Diagnosis Acute pain;Difficulty walking   PT Problem List Decreased strength;Pain;Impaired sensation;Decreased activity tolerance;Decreased balance;Decreased mobility  PT Treatment Interventions Balance training;Gait training;Functional mobility training;Therapeutic activities;Therapeutic exercise;Wheelchair mobility training;Patient/family education;Stair training   PT Goals (Current goals can be found in the Care Plan section) Acute Rehab PT Goals Patient Stated Goal: to go to rehab PT Goal Formulation: With patient/family Time For Goal Achievement: 01/10/16 Potential to Achieve Goals: Good    Frequency Min 3X/week   Barriers to discharge Inaccessible home environment steps to enter home and full flight to get to bedroom    Co-evaluation PT/OT/SLP Co-Evaluation/Treatment: Yes Reason for Co-Treatment: For patient/therapist safety PT goals addressed during session: Mobility/safety with mobility;Strengthening/ROM;Balance OT goals addressed during session: ADL's and self-care       End of Session Equipment Utilized During Treatment: Gait belt Activity Tolerance: Patient limited by pain Patient left: in bed;with call bell/phone within reach;with family/visitor present Nurse Communication: Mobility status;Other (comment) (pain)         Time: [id number] PT Time Calculation (min) (ACUTE ONLY): 20 min   Charges:   PT Evaluation $PT Eval Moderate Complexity: 1 Procedure     PT G Codes:        Olar Santini A Daundre Biel 12/27/2015, 10:43 AM Wray Kearns, PT, DPT [phone]

## 2015-12-27 NOTE — Care Management Note (Signed)
Case Management Note Marvetta Gibbons RN, BSN Unit 2W-Case Manager [phone]  Patient Details  Name: Adam [name] MRN: [id number] Date of Birth: January 23, 1951  Subjective/Objective:    Pt readmitted for AKA                 Action/Plan: PTA pt was at CIR post BKA- prior to that pt was at home with wife and [facility] services with [facility]- plan is for CIR to re-eval pt post AKA for possible readmission back to CIR. CM to follow  Expected Discharge Date:                  Expected Discharge Plan:  [place]  In-House Referral:  Clinical Social Work  Discharge planning Services  CM Consult  Post Acute Care Choice:    Choice offered to:     DME Arranged:    DME Agency:     HH Arranged:    [place] Agency:     Status of Service:  In process, will continue to follow  Medicare Important Message Given:    Date Medicare IM Given:    Medicare IM give by:    Date Additional Medicare IM Given:    Additional Medicare Important Message give by:     If discussed at [place] of Stay Meetings, dates discussed:    Additional Comments:  Dawayne Patricia, RN 12/27/2015, 9:52 AM

## 2015-12-27 NOTE — Progress Notes (Signed)
Rehab admissions - Patient known to me from previous acute inpatient rehab stay.  We will need re-authorization for acute inpatient rehab admission.  I will begin precert process today.  Call me for questions.  [phone]

## 2015-12-27 NOTE — Progress Notes (Addendum)
  Progress Note    12/27/2015 7:47 AM 1 Day Post-Op  Subjective:  C/o pain-needed PCA added back last night  Tm 99.8 0000000 systolic HR [id number] NSR 97% 2LO2NC  Filed Vitals:   12/27/15 0400 12/27/15 0424  BP:  167/86  Pulse:  87  Temp:  99.8 F (37.7 C)  Resp: 14     Physical Exam: Incisions:  Bandage is clean and dry   CBC    Component Value Date/Time   WBC 12.4* 12/27/2015 0518   RBC 3.75* 12/27/2015 0518   HGB 11.5* 12/27/2015 0518   HCT 34.0* 12/27/2015 0518   PLT 479* 12/27/2015 0518   MCV 90.7 12/27/2015 0518   MCH 30.7 12/27/2015 0518   MCHC 33.8 12/27/2015 0518   RDW 12.5 12/27/2015 0518   LYMPHSABS 0.9 12/24/2015 0857   MONOABS 1.0 12/24/2015 0857   EOSABS 0.3 12/24/2015 0857   BASOSABS 0.0 12/24/2015 0857    BMET    Component Value Date/Time   NA 137 12/27/2015 0518   K 4.9 12/27/2015 0518   CL 101 12/27/2015 0518   CO2 27 12/27/2015 0518   GLUCOSE 95 12/27/2015 0518   BUN 12 12/27/2015 0518   CREATININE 0.65 12/27/2015 0518   CREATININE 0.82 11/22/2015 0001   CALCIUM 8.9 12/27/2015 0518   GFRNONAA >60 12/27/2015 0518   GFRAA >60 12/27/2015 0518    INR    Component Value Date/Time   INR 1.07 12/16/2015 0322     Intake/Output Summary (Last 24 hours) at 12/27/15 0747 Last data filed at 12/27/15 0710  Gross per 24 hour  Intake    800 ml  Output   1300 ml  Net   -500 ml     Assessment/Plan:  65 y.o. male is s/p right above knee amputation  1 Day Post-Op  -stump is wrapped and dressing is dry -pt needed more pain medication with last amputation.  Will add back Valium and Flexeril  -will take down dressing tomorrow   Leontine Locket, PA-C [facility] [phone] 12/27/2015 7:47 AM   I have examined the patient, reviewed and agree with above. Pain management continues to be major issue. Will mobilize up to chair today. Will ask pharmacy to assist. Assured him that this will resolve after several  days.  [name], MD 12/27/2015 8:34 AM

## 2015-12-27 NOTE — Progress Notes (Signed)
Utilization review completed. Shameria Trimarco, RN, BSN. 

## 2015-12-27 NOTE — Progress Notes (Signed)
[facility]  No Known Allergies   Mr. Hagee is a 65 yo M who unfortunately has had 4 surgeries related to R foot pain and peripheral vascular disease, most recently an right AKA 12/26/15.  The patient complains of significant post-op pain which was better controlled after his previous surgery.  The physician has asked that pharmacy review his medications and make recommendations based on what controlled his pain previously.   Medical History: Past Medical History  Diagnosis Date  . Diabetes mellitus without complication ([place])   . Hypertension   . Peripheral vascular disease ([place])   . Gangrene ([place]) 11/22/2015    right great toe  . Sleep apnea     uses cpap    Current Pain Medications:  Acetaminophen 325-650mg  PO/PR q4h prn Flexeril 5mg  PO TID prn Diazepam 5mg  IV q8hprn Dilaudid PCA Morphine IV 2-4mg  IV q2h prn Oxycodone 15mg  q4h prn Percocet 5/325mg  1-2 tabs q6h prn  Assessment: Mr. Bonser is awake and alert (exibiting no signs of oversedation) and reports extreme pain last night that prevented him from resting.  Upon review, the only pain medications he received overnight was the Dilaudid PCA and a Fentanyl patch.  He reports the Morphine IV did not work well for him.  Oxycodone, Celebrex, Flexeril, and a Fentanyl patch have worked for him in the past.  This morning Flexeril for muscle spasm/pain and Valium for anxiety were added to his profile but he has yet to receive any doses.  Pain scores have ranged from 8-10.  Recommendations:   1. Add Fentanyl 32mcg patch q72h to patient profile - patch change due 2/10.  He currently has a patch applied although it was originally discontinued post-op. 2. Add Celebrex 200mg  PO BID. 3. Discontinue Morphine IV and Percocet - therapeutic duplication. 4. Continue Oxycodine at current 15mg  dose while on PCA.  In the past however, patient did require doses of Oxycodone 20-25mg  q4h  (scheduled) for pain control when off the PCA. 5. Continue Flexeril 5mg  PO q8h prn.  Consider increasing to 10mg  q8h prn if needed when weaning off the PCA.   I have discussed the above recommendations with Leontine Locket, PA and updated the patient, his wife, and his RN.   [employer], Pharm.D., BCPS Clinical Pharmacist Pager [phone] 12/27/2015 11:12 AM

## 2015-12-27 NOTE — Evaluation (Signed)
Occupational Therapy Evaluation Patient Details Name: Adam [name] MRN: [id number] DOB: November 22, 1950 Today's Date: 12/27/2015    History of Present Illness Pt admitted for R AKA from CIR. 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   Clinical Impression   Pt is independent at his baseline.  Pt presents with post operative pain, generalized weakness and impaired balance interfering with ability to perform ADL and mobility.  Will follow acutely. Recommending return to rehab prior to return home with his wife.      Follow Up Recommendations  CIR;Supervision/Assistance - 24 hour    Equipment Recommendations       Recommendations for Other Services Rehab consult     Precautions / Restrictions Precautions Precautions: Fall Restrictions Weight Bearing Restrictions: No      Mobility Bed Mobility Overal bed mobility: Needs Assistance Bed Mobility: Supine to Sit     Supine to sit: Min guard;HOB elevated     General bed mobility comments: Increased time, good use of UEs to scoot to EOB.  Transfers Overall transfer level: Needs assistance Equipment used: Rolling walker (2 wheeled) Transfers: Sit to/from [facility] Sit to Stand: Min assist;+2 safety/equipment Stand pivot transfers: Min assist;+2 safety/equipment       General transfer comment: cues for hand placement, min assist to steady    Balance Overall balance assessment: Needs assistance   Sitting balance-Leahy Scale: Fair       Standing balance-Leahy Scale: Poor Standing balance comment: dependent on B UEs                            ADL Overall ADL's : Needs assistance/impaired Eating/Feeding: Set up;Sitting   Grooming: Set up;Sitting   Upper Body Bathing: Set up;Sitting   Lower Body Bathing: Sit to/from stand;Maximal assistance   Upper Body Dressing : Set up;Sitting   Lower Body Dressing: Maximal assistance;Sit  to/from stand   Toilet Transfer: Minimal assistance;+2 for safety/equipment;Stand-pivot;RW Toilet Transfer Details (indicate cue type and reason): simulated bed to chair           General ADL Comments: Pt fatigued, reporting having not slept well last night, but still agreeable to OOB to chair.     Vision     Perception     Praxis      Pertinent Vitals/Pain Pain Assessment: Faces Faces Pain Scale: Hurts even more Pain Location: R LE Pain Descriptors / Indicators: Guarding Pain Intervention(s): Limited activity within patient's tolerance;Monitored during session;Premedicated before session;PCA encouraged;Repositioned     Hand Dominance Right   Extremity/Trunk Assessment Upper Extremity Assessment Upper Extremity Assessment: Overall WFL for tasks assessed   Lower Extremity Assessment Lower Extremity Assessment: Defer to PT evaluation       Communication Communication Communication: No difficulties   Cognition Arousal/Alertness: Awake/alert Behavior During Therapy: WFL for tasks assessed/performed Overall Cognitive Status: Within Functional Limits for tasks assessed                     General Comments       Exercises       Shoulder Instructions      Home Living Family/patient expects to be discharged to:: Private residence Living Arrangements: Spouse/significant other Available Help at Discharge: Family;Available 24 hours/day Type of Home: House Home Access: Stairs to enter [employer] of Steps: 4 from garage Entrance Stairs-Rails: None Home Layout: Two level;Bed/bath upstairs;1/2 bath on main level Alternate  Level Stairs-Number of Steps: 15 then landing then 6 more Alternate Level Stairs-Rails: Left Bathroom Shower/Tub: Occupational psychologist: Standard     Home Equipment: Environmental consultant - 2 wheels;Crutches;Shower seat - built in      Lives With: Spouse    Prior Functioning/Environment Level of Independence: Independent  with assistive device(s)             OT Diagnosis: Generalized weakness;Acute pain   OT Problem List: Decreased strength;Decreased activity tolerance;Impaired balance (sitting and/or standing);Decreased knowledge of use of DME or AE;Pain;Decreased knowledge of precautions   OT Treatment/Interventions: Self-care/ADL training;DME and/or AE instruction;Patient/family education;Therapeutic exercise;Therapeutic activities;Balance training    OT Goals(Current goals can be found in the care plan section) Acute Rehab OT Goals Patient Stated Goal: to go to rehab OT Goal Formulation: With patient/family Time For Goal Achievement: 01/10/16 Potential to Achieve Goals: Good ADL Goals Pt Will Perform Grooming: with supervision;standing Pt Will Perform Lower Body Bathing: with modified independence;sitting/lateral leans Pt Will Perform Lower Body Dressing: sitting/lateral leans;with modified independence Pt Will Transfer to Toilet: with supervision;ambulating;bedside commode (over toilet) Pt Will Perform Toileting - Clothing Manipulation and hygiene: sitting/lateral leans;with modified independence  OT Frequency: Min 2X/week   Barriers to D/C: Inaccessible home environment          Co-evaluation PT/OT/SLP Co-Evaluation/Treatment: Yes Reason for Co-Treatment: For patient/therapist safety   OT goals addressed during session: ADL's and self-care      End of Session Equipment Utilized During Treatment: Gait belt;Rolling walker;Oxygen Nurse Communication: Mobility status  Activity Tolerance: Patient limited by fatigue Patient left: in chair;with call bell/phone within reach;with family/visitor present   Time: [id number] OT Time Calculation (min): 23 min Charges:  OT General Charges $OT Visit: 1 Procedure OT Evaluation $OT Eval Moderate Complexity: 1 Procedure G-Codes:    Malka So 12/27/2015, 10:18 AM  [phone]

## 2015-12-28 LAB — BASIC METABOLIC PANEL
Anion gap: 10 (ref 5–15)
BUN: 16 mg/dL (ref 6–20)
CALCIUM: 8.9 mg/dL (ref 8.9–10.3)
CO2: 29 mmol/L (ref 22–32)
CREATININE: 0.69 mg/dL (ref 0.61–1.24)
Chloride: 99 mmol/L — ABNORMAL LOW (ref 101–111)
GFR calc non Af Amer: >60 mL/min (ref >60)
Glucose, Bld: 118 mg/dL — ABNORMAL HIGH (ref 65–99)
Potassium: 4.3 mmol/L (ref 3.5–5.1)
SODIUM: 138 mmol/L (ref 135–145)

## 2015-12-28 LAB — CBC
HCT: 34.2 % — ABNORMAL LOW (ref 39.0–52.0)
Hemoglobin: 10.9 g/dL — ABNORMAL LOW (ref 13.0–17.0)
MCH: 29.1 pg (ref 26.0–34.0)
MCHC: 31.9 g/dL (ref 30.0–36.0)
MCV: 91.2 fL (ref 78.0–100.0)
PLATELETS: 459 10*3/uL — AB (ref 150–400)
RBC: 3.75 MIL/uL — AB (ref 4.22–5.81)
RDW: 12.8 % (ref 11.5–15.5)
WBC: 10.9 10*3/uL — AB (ref 4.0–10.5)

## 2015-12-28 NOTE — Progress Notes (Signed)
Physical Therapy Treatment Patient Details Name: Adam [name] MRN: [id number] DOB: Dec 02, 1950 Today's Date: 12/28/2015    History of Present Illness Pt admitted for R AKA from CIR. 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    PT Comments    Patient progressing slowly towards PT goals. Tolerated short distance ambulation today with Min guard assist for safety. Pt self limited ambulation distance today. "I am tired, did not get much sleep." Pain much improved. Instructed pt in exercises for right residual limb. Encouraged OOB to chair for all meals. Will continue to follow per current POC.   Follow Up Recommendations  CIR     Equipment Recommendations  None recommended by PT    Recommendations for Other Services       Precautions / Restrictions Precautions Precautions: Fall Restrictions Weight Bearing Restrictions: Yes RLE Weight Bearing: Non weight bearing    Mobility  Bed Mobility Overal bed mobility: Needs Assistance Bed Mobility: Supine to Sit     Supine to sit: Min guard;HOB elevated     General bed mobility comments: Increased time, good use of UEs to scoot to EOB. No dizziness.  Transfers Overall transfer level: Needs assistance Equipment used: Rolling walker (2 wheeled) Transfers: Sit to/from Stand Sit to Stand: Min assist;+2 safety/equipment Stand pivot transfers: Min assist;+2 safety/equipment       General transfer comment: cues for hand placement, min assist to steady. SPT bed to chair with Min A for balance. Better able to extend right residual limb into neutral position with cues for glute activation.  Ambulation/Gait Ambulation/Gait assistance: Min guard Ambulation Distance (Feet): 10 Feet Assistive device: Rolling walker (2 wheeled) Gait Pattern/deviations: Trunk flexed ("swing to")   Gait velocity interpretation: Below normal speed for age/gender General Gait Details: Cues for RW  proximity, positioning. Pt self limited ambulation distance today.    Stairs            Wheelchair Mobility    Modified Rankin (Stroke Patients Only)       Balance Overall balance assessment: Needs assistance Sitting-balance support: Feet supported;Bilateral upper extremity supported Sitting balance-Leahy Scale: Fair     Standing balance support: During functional activity Standing balance-Leahy Scale: Poor Standing balance comment: Reliant on UEs for support. No dizziness today.                    Cognition Arousal/Alertness: Awake/alert Behavior During Therapy: WFL for tasks assessed/performed Overall Cognitive Status: Within Functional Limits for tasks assessed                      Exercises Amputee Exercises Quad Sets: Right;5 reps;Seated Gluteal Sets: Both;10 reps;Seated Hip Extension: Right;10 reps;Seated Hip ABduction/ADduction: Right;10 reps;Seated    General Comments General comments (skin integrity, edema, etc.): Discussed importance of sitting up in chair for all meals.       Pertinent Vitals/Pain Pain Assessment: Faces Faces Pain Scale: Hurts a little bit Pain Location: right residual limb Pain Descriptors / Indicators: Guarding;Grimacing Pain Intervention(s): Monitored during session;Repositioned;PCA encouraged;Premedicated before session;Limited activity within patient's tolerance    Home Living                      Prior Function            PT Goals (current goals can now be found in the care plan section) Progress towards PT goals: Progressing toward goals    Frequency  Min 3X/week    PT Plan Current plan remains appropriate    Co-evaluation             End of Session Equipment Utilized During Treatment: Gait belt Activity Tolerance: Patient limited by fatigue;Patient tolerated treatment well Patient left: in chair;with call bell/phone within reach;with family/visitor present     Time:  [id number] PT Time Calculation (min) (ACUTE ONLY): 25 min  Charges:  $Gait Training: 8-22 mins $Therapeutic Exercise: 8-22 mins                    G Codes:      Atiba Kimberlin A Kyliana Standen 12/28/2015, 11:58 AM Wray Kearns, [place], DPT [phone]

## 2015-12-28 NOTE — Progress Notes (Signed)
Rehab admissions - I have approval from insurance carrier for acute inpatient rehab admission beginning tomorrow.  I will plan for inpatient rehab admission tomorrow if patient is medically ready.  Call me for questions.  [phone]

## 2015-12-28 NOTE — Progress Notes (Signed)
Orthopedic Tech Progress Note Patient Details:  Adam [name] 20-Jan-1951 [id number]  Patient ID: [name], male   DOB: 27-Nov-1950, 65 y.o.   MRN: [id number] Called in bio-tech brace order; spoke with Dolores Lory, Takyla Kuchera 12/28/2015, 9:14 AM

## 2015-12-28 NOTE — Progress Notes (Signed)
  Progress Note    12/28/2015 7:53 AM 2 Days Post-Op  Subjective:  Pt's says his pain is much better controlled  Afebrile VSS  100% 2LO2NC Filed Vitals:   12/28/15 0747 12/28/15 0748  BP:    Pulse:    Temp:    Resp: 20 15    Physical Exam: Incisions:  Clean and dry with staples in tact.  No ecchymosis    CBC    Component Value Date/Time   WBC 10.9* 12/28/2015 0240   RBC 3.75* 12/28/2015 0240   HGB 10.9* 12/28/2015 0240   HCT 34.2* 12/28/2015 0240   PLT 459* 12/28/2015 0240   MCV 91.2 12/28/2015 0240   MCH 29.1 12/28/2015 0240   MCHC 31.9 12/28/2015 0240   RDW 12.8 12/28/2015 0240   LYMPHSABS 0.9 12/24/2015 0857   MONOABS 1.0 12/24/2015 0857   EOSABS 0.3 12/24/2015 0857   BASOSABS 0.0 12/24/2015 0857    BMET    Component Value Date/Time   NA 138 12/28/2015 0240   K 4.3 12/28/2015 0240   CL 99* 12/28/2015 0240   CO2 29 12/28/2015 0240   GLUCOSE 118* 12/28/2015 0240   BUN 16 12/28/2015 0240   CREATININE 0.69 12/28/2015 0240   CREATININE 0.82 11/22/2015 0001   CALCIUM 8.9 12/28/2015 0240   GFRNONAA >60 12/28/2015 0240   GFRAA >60 12/28/2015 0240    INR    Component Value Date/Time   INR 1.07 12/16/2015 0322     Intake/Output Summary (Last 24 hours) at 12/28/15 0753 Last data filed at 12/28/15 [id number]  Gross per 24 hour  Intake    720 ml  Output   1025 ml  Net   -305 ml     Assessment/Plan:  65 y.o. male is s/p right above knee amputation  2 Days Post-Op  -stump is viable -stump looks good this morning -pain is much better controlled with pain regimen (see pharmacy's note from yesterday) -continue PCA today and start weaning tomorrow. -discussed with Joelene Millin (pharmacist) yesterday about pain regimen.  When starting to wean PCA, will increase Flexeril to 10mg  q8h   Leontine Locket, PA-C [facility] [phone] 12/28/2015 7:53 AM

## 2015-12-28 NOTE — PMR Pre-admission (Signed)
PMR Admission Coordinator Pre-Admission Assessment  Patient: Adam [name] is an 65 y.o., male MRN: [id number] DOB: [birth year] Height: 5'11" Weight: 179 lbs             Insurance Information HMO:      PPO:  Yes     PCP:       IPA:       80/20:       OTHER:  Group #[id number] PRIMARY: All Sa[employer] [facility]      Policy#: [id number]      Subscriber: [name] CM Name: [name].      Phone#: [phone]     Fax#: [phone] Pre-Cert#: [id number] with update due on 01/01/16      Employer:  [employer] Benefits:  Phone #: [phone]     Name: [name]. Date: 07/18/13     Deduct:  $2000 (met $0)      Out of Pocket Max:  $4000 (met $229.01)      Life Max: unlimited CIR: 100% after ded met      SNF: 1005 after ded met and 60 days max Outpatient: 100% after ded met and 30 visits combined     Co-Pay: none Home Health: 100% after ded met      Co-Pay: none DME: 1005 after ded met     Co-Pay: none Providers: in network  SECONDARY: Medicare A/B      Policy#: [id number]      Subscriber: [name] CM Name:        Phone#:       Fax#:   Pre-Cert#:        Employer: [employer] Benefits:  Phone #:       Name:  Eff. Date:       Deduct:        Out of Pocket Max:        Life Max:   CIR:        SNF:   Outpatient:       Co-Pay:   Home Health:        Co-Pay:   DME:       Co-Pay:    Emergency Contact Information Contact Information    Name Relation Home Work Mobile   [place] Spouse   [phone]     Current Medical History  Patient Admitting Diagnosis:  R AKA  History of Present Illness: A 65 y.o. right handed male with history of remote tobacco abuse, hypertension, diabetes mellitus and peripheral neuropathy, peripheral vascular disease status post right femoral-popliteal artery bypass 11/28/2015 and gangrenous right great toe status post right great toe amputation 12/01/2015 and chronic pain maintained on oxycodone 15 mg every 4 hours as needed. Patient lives with his wife. Independent prior to  admission working full time up until latest femoral bypass surgery. 2 level home with 2 steps to entry and bedroom upstairs. Wife can assist as needed. Presented 12/15/2015 with severe right foot pain and poor healing of recent right great toe amputation with profound ischemic changes. Limb was not felt to be salvageable and underwent right below-knee amputation 12/16/2015 per Dr. Donnetta Hutching. Hospital course pain management with pharmacy consulted for pain control and wean from PCA placed on Oxycodone 46m every 4 hour,neurontin and fentanyl patch. Subcutaneous Lovenox for DVT prophylaxis. Physical and occupational therapy evaluations completed with recommendations of physical medicine rehabilitation consult. Patient was admitted for a comprehensive rehabilitation program 12/20/2015. Patient progressing nicely with therapies. Follow-up vascular surgery noting progressive ischemic changes nonhealing right BKA site felt  AKA was needed. Patient was discharged to the services of vascular surgery 12/26/2015 and underwent right AKA for Dr. Donnetta Hutching. Hospital course ongoing pain management. Acute blood loss anemia 10.9 and monitored. Physical and occupational therapy evaluations completed postoperatively with recommendations of physical medicine rehabilitation consult. Patient to be  Re-admitted for comprehensive inpatient rehabilitation program  Past Medical History  Past Medical History  Diagnosis Date  . Hypertension   . Peripheral vascular disease ([place])   . Gangrene ([place]) 11/22/2015    right great toe  . OSA on CPAP   . Type II diabetes mellitus ([place]) dx'd 11/2015  . PAD (peripheral artery disease) ([place])     Family History  family history includes Hypertension in his mother.  Prior Rehab/Hospitalizations: No previous rehab admissions.  Did have bypass surgery and a toe amputation in early January 2017.  Has the patient had major surgery during 100 days prior to admission? Yes. See above  Current Medications    Current facility-administered medications:  .  0.9 %  sodium chloride infusion, , Intravenous, Continuous, Samantha J Rhyne, PA-C, Last Rate: 75 mL/hr at 12/27/15 1053 .  acetaminophen (TYLENOL) tablet 325-650 mg, 325-650 mg, Oral, Q4H PRN **OR** acetaminophen (TYLENOL) suppository 325-650 mg, 325-650 mg, Rectal, Q4H PRN, Samantha J Rhyne, PA-C .  alum & mag hydroxide-simeth (MAALOX/MYLANTA) 200-200-20 MG/5ML suspension 15-30 mL, 15-30 mL, Oral, Q2H PRN, Samantha J Rhyne, PA-C .  aspirin chewable tablet 81 mg, 81 mg, Oral, Daily, Samantha J Rhyne, PA-C, 81 mg at 12/28/15 1045 .  atorvastatin (LIPITOR) tablet 10 mg, 10 mg, Oral, QODAY, Samantha J Rhyne, PA-C, 10 mg at 12/26/15 1749 .  bisacodyl (DULCOLAX) suppository 10 mg, 10 mg, Rectal, Daily PRN, Samantha J Rhyne, PA-C .  celecoxib (CELEBREX) capsule 200 mg, 200 mg, Oral, BID, Samantha J Rhyne, PA-C, 200 mg at 12/28/15 1048 .  cyclobenzaprine (FLEXERIL) tablet 5 mg, 5 mg, Oral, TID PRN, Hulen Shouts Rhyne, PA-C, 5 mg at 12/28/15 0846 .  diazepam (VALIUM) injection 5 mg, 5 mg, Intravenous, Q8H PRN, Hulen Shouts Rhyne, PA-C, 5 mg at 12/27/15 2150 .  diphenhydrAMINE (BENADRYL) injection 12.5 mg, 12.5 mg, Intravenous, Q6H PRN **OR** diphenhydrAMINE (BENADRYL) 12.5 MG/5ML elixir 12.5 mg, 12.5 mg, Oral, Q6H PRN, [name], MD .  docusate sodium (COLACE) capsule 100 mg, 100 mg, Oral, BID PRN, Samantha J Rhyne, PA-C .  docusate sodium (COLACE) capsule 100 mg, 100 mg, Oral, Daily, Samantha J Rhyne, PA-C, 100 mg at 12/28/15 1046 .  enoxaparin (LOVENOX) injection 30 mg, 30 mg, Subcutaneous, Q24H, Samantha J Rhyne, PA-C, 30 mg at 12/28/15 1451 .  fentaNYL (DURAGESIC - dosed mcg/hr) 50 mcg, 50 mcg, Transdermal, Q72H, Samantha J Rhyne, PA-C, 50 mcg at 12/28/15 0011 .  guaiFENesin-dextromethorphan (ROBITUSSIN DM) 100-10 MG/5ML syrup 15 mL, 15 mL, Oral, Q4H PRN, Samantha J Rhyne, PA-C .  hydrALAZINE (APRESOLINE) injection 5 mg, 5 mg, Intravenous, Q20 Min PRN,  Samantha J Rhyne, PA-C .  lisinopril (PRINIVIL,ZESTRIL) tablet 20 mg, 20 mg, Oral, Daily, 20 mg at 12/28/15 1047 **AND** hydrochlorothiazide (MICROZIDE) capsule 12.5 mg, 12.5 mg, Oral, Daily, Samantha J Rhyne, PA-C, 12.5 mg at 12/28/15 1047 .  HYDROmorphone (DILAUDID) 1 mg/mL PCA injection, , Intravenous, 6 times per day, [name], MD .  labetalol (NORMODYNE,TRANDATE) injection 10 mg, 10 mg, Intravenous, Q10 min PRN, Hulen Shouts Rhyne, PA-C, 10 mg at 12/27/15 0434 .  magnesium hydroxide (MILK OF MAGNESIA) suspension 30 mL, 30 mL, Oral, Daily PRN, Hulen Shouts Rhyne, PA-C .  magnesium sulfate IVPB 2 g 50 mL, 2 g, Intravenous, Daily PRN, Samantha J Rhyne, PA-C .  metoprolol (LOPRESSOR) injection 2-5 mg, 2-5 mg, Intravenous, Q2H PRN, Samantha J Rhyne, PA-C .  multivitamin with minerals tablet 1 tablet, 1 tablet, Oral, Daily, [name], MD, 1 tablet at 12/28/15 1048 .  naloxone (NARCAN) injection 0.4 mg, 0.4 mg, Intravenous, PRN **AND** sodium chloride flush (NS) 0.9 % injection 9 mL, 9 mL, Intravenous, PRN, [name], MD .  ondansetron (ZOFRAN) injection 4 mg, 4 mg, Intravenous, Q6H PRN, Samantha J Rhyne, PA-C .  ondansetron (ZOFRAN) injection 4 mg, 4 mg, Intravenous, Q6H PRN, [name], MD .  oxyCODONE (Oxy IR/ROXICODONE) immediate release tablet 15 mg, 15 mg, Oral, Q4H PRN, Samantha J Rhyne, PA-C, 15 mg at 12/28/15 1254 .  pantoprazole (PROTONIX) EC tablet 40 mg, 40 mg, Oral, Daily, Samantha J Rhyne, PA-C, 40 mg at 12/28/15 1046 .  phenol (CHLORASEPTIC) mouth spray 1 spray, 1 spray, Mouth/Throat, PRN, Samantha J Rhyne, PA-C .  potassium chloride SA (K-DUR,KLOR-CON) CR tablet 20-40 mEq, 20-40 mEq, Oral, Daily PRN, Hulen Shouts Rhyne, PA-C  Patients Current Diet: Diet regular Room service appropriate?: Yes; Fluid consistency:: Thin  Precautions / Restrictions Precautions Precautions: Fall Precaution Comments: R AKA Restrictions Weight Bearing Restrictions: Yes RLE Weight Bearing: Non  weight bearing   Has the patient had 2 or more falls or a fall with injury in the past year?No  Prior Activity Level Community (5-7x/wk): Went out most days prior to toe amputation and surgery in early January 2017  [place] / [place] Devices/Equipment: CBG Meter, CPAP Home Equipment: Environmental consultant - 2 wheels, Crutches, Shower seat - built in  Prior Device Use: Indicate devices/aids used by the patient prior to current illness, exacerbation or injury? Walker and Crutches  Prior Functional Level Prior Function Level of Independence: Independent with assistive device(s)  Self Care: Did the patient need help bathing, dressing, using the toilet or eating?  Needed some helpWife assisted with dressing post toe amputation.  Indoor Mobility: Did the patient need assistance with walking from room to room (with or without device)? Independent  Stairs: Did the patient need assistance with internal or external stairs (with or without device)? Independent  Functional Cognition: Did the patient need help planning regular tasks such as shopping or remembering to take medications? Independent  Current Functional Level Cognition  Overall Cognitive Status: Within Functional Limits for tasks assessed Orientation Level: Oriented X4    Extremity Assessment (includes Sensation/Coordination)  Upper Extremity Assessment: Defer to OT evaluation  Lower Extremity Assessment: Defer to PT evaluation RLE Deficits / Details: Positioned in hip flexion due to tight hip flexors; limited AROM secondary to pain. Cues for glute and hamstring activation, able to improve right residual limb to almost neutral. RLE: Unable to fully assess due to pain RLE Sensation: decreased light touch    ADLs  Overall ADL's : Needs assistance/impaired Eating/Feeding: Set up, Sitting Grooming: Set up, Sitting Upper Body Bathing: Set up, Sitting Lower Body Bathing: Sit to/from stand, Maximal  assistance Upper Body Dressing : Set up, Sitting Lower Body Dressing: Maximal assistance, Sit to/from stand Toilet Transfer: Minimal assistance, +2 for safety/equipment, Stand-pivot, RW Toilet Transfer Details (indicate cue type and reason): simulated bed to chair General ADL Comments: Pt fatigued, reporting having not slept well last night, but still agreeable to OOB to chair.    Mobility  Overal bed mobility: Needs Assistance Bed Mobility: Supine to Sit Supine to  sit: Min guard, HOB elevated General bed mobility comments: Increased time, good use of UEs to scoot to EOB. No dizziness.    Transfers  Overall transfer level: Needs assistance Equipment used: Rolling walker (2 wheeled) Transfers: Sit to/from Stand Sit to Stand: Min assist, +2 safety/equipment Stand pivot transfers: Min assist, +2 safety/equipment General transfer comment: cues for hand placement, min assist to steady. SPT bed to chair with Min A for balance. Better able to extend right residual limb into neutral position with cues for glute activation.    Ambulation / Gait / Stairs / Wheelchair Mobility  Ambulation/Gait Ambulation/Gait assistance: Physicist, medical (Feet): 10 Feet Assistive device: Rolling walker (2 wheeled) Gait Pattern/deviations: Trunk flexed ("swing to") General Gait Details: Cues for RW proximity, positioning. Pt self limited ambulation distance today.  Gait velocity interpretation: Below normal speed for age/gender    Posture / Balance Balance Overall balance assessment: Needs assistance Sitting-balance support: Feet supported, Bilateral upper extremity supported Sitting balance-Leahy Scale: Fair Standing balance support: During functional activity Standing balance-Leahy Scale: Poor Standing balance comment: Reliant on UEs for support. No dizziness today.    Special needs/care consideration BiPAP/CPAP Yes, has CPAP at home and in the acute hospital CPM NO Continuous Drip IV 0.9%  NS 75 mL/hr Dialysis No       Life Vest No Oxygen No Special Bed No Trach Size No Wound Vac (area) No      Skin Has a new R AKA incision with dressing and ace wrap.  Has a decubitus ulcer on right buttock per wife new since admission.  Has a rash on his face.                              L Bowel mgmt: Last BM 12/25/15 Bladder mgmt: WDL Diabetic mgmt Yes, is a diabetic, new diagnosis.    Previous Home Environment Living Arrangements: Spouse/significant other  Lives With: Spouse Available Help at Discharge: Family, Available 24 hours/day Type of Home: House Home Layout: Two level, Bed/bath upstairs, 1/2 bath on main level Alternate Level Stairs-Rails: Left Alternate Level Stairs-Number of Steps: 15 then landing then 6 more Home Access: Stairs to enter Entrance Stairs-Rails: None Entrance Stairs-Number of Steps: 4 from garage Bathroom Shower/Tub: Multimedia programmer: [place]: No Additional Comments: Pt has 2 steps in garage w/o rails, has 3 steps in front of the house w/ Lt and Rt rails, and has 1 flight to get upstairs to his bedroom w/ Lt rail  Discharge Living Setting Plans for Discharge Living Setting: Patient's home, House, Lives with (comment) (Lives with wife.) Type of Home at Discharge: House Discharge Home Layout: Multi-level, 1/2 bath on main level, Bed/bath upstairs Alternate Level Stairs-Number of Steps: 13 steps then a landing then 6 steps Discharge Home Access: Stairs to enter Entrance Stairs-Rails: None Entrance Stairs-Number of Steps: 2 steps at garage entry Does the patient have any problems obtaining your medications?: No  Social/Family/Support Systems Patient Roles: Spouse, Parent (Has a wife.) Contact Information: Keaghan Staton - wife Anticipated Caregiver: wife and self Anticipated Caregiver's Contact Information: [name] - wife - [phone] Ability/Limitations of Caregiver: Wife works PT and has a flexible  schedule Caregiver Availability: 24/7 Discharge Plan Discussed with Primary Caregiver: Yes Is Caregiver In Agreement with Plan?: Yes Does Caregiver/Family have Issues with Lodging/Transportation while Pt is in Rehab?: No  Goals/Additional Needs Patient/Family Goal for Rehab: Mod I and supervision goals Expected length  of stay: 5-7 days Cultural Considerations: None Dietary Needs: Regular diet, thin liquids Equipment Needs: TBD Pt/Family Agrees to Admission and willing to participate: Yes Program Orientation Provided & Reviewed with Pt/Caregiver Including Roles  & Responsibilities: Yes  Decrease burden of Care through IP rehab admission: N/A  Possible need for SNF placement upon discharge: Not planned  Patient Condition: This patient's medical and functional status has changed since the consult dated: Original consult done 12/17/15 in which the Rehabilitation Physician determined and documented that the patient's condition is appropriate for intensive rehabilitative care in an inpatient rehabilitation facility. See "History of Present Illness" (above) for medical update. Functional changes are: Currently requiring min assist for transfers and minguard 10 feet RW. Patient's medical and functional status update has been discussed with the Rehabilitation physician and patient remains appropriate for inpatient rehabilitation. Will admit to inpatient rehab tomorrow.  Preadmission Screen Completed By:  Retta Diones, 12/28/2015 4:05 PM ______________________________________________________________________   Discussed status with Dr. Naaman Plummer  On 12/28/15 at 1613 and received telephone approval for admission tomorrow.  Admission Coordinator:  Retta Diones, time1613/Date02/10/17

## 2015-12-28 NOTE — H&P (Signed)
  Physical Medicine and Rehabilitation Admission H&P    Chief complaint: Stump pain  HPI: Adam [name] is a 65 y.o. right handed male with history of remote tobacco abuse, hypertension, diabetes mellitus and peripheral neuropathy, peripheral vascular disease status post right femoral-popliteal artery bypass 11/28/2015 and gangrenous right great toe status post right great toe amputation 12/01/2015 and chronic pain maintained on oxycodone 15 mg every 4 hours as needed. Patient lives with his wife. Independent prior to admission working full time up until latest femoral bypass surgery. 2 level home with 2 steps to entry and bedroom upstairs. Wife can assist as needed. Presented 12/15/2015 with severe right foot pain and poor healing of recent right great toe amputation with profound ischemic changes. Limb was not felt to be salvageable and underwent right below-knee amputation 12/16/2015 per Dr. Early. Hospital course pain management with pharmacy consulted for pain control and wean from PCA placed on Oxycodone 30mg every 4 hour,neurontin and fentanyl patch. Subcutaneous Lovenox for DVT prophylaxis. Physical and occupational therapy evaluations completed with recommendations of physical medicine rehabilitation consult. Patient was admitted for a comprehensive rehabilitation program 12/20/2015. Patient progressing nicely with therapies. Follow-up vascular surgery noting progressive ischemic changes nonhealing right BKA site felt AKA was needed. Patient was discharged to the services of vascular surgery 12/26/2015 and underwent right AKA for Dr. Early. Hospital course ongoing pain management. Acute blood loss anemia 10.9 and monitored. Physical and occupational therapy evaluations completed postoperatively with recommendations of physical medicine rehabilitation consult. Patient was admitted for comprehensive rehabilitation program  ROS ROS Constitutional: Negative for fever and chills.  HENT: Negative for  hearing loss.  Eyes: Negative for blurred vision and double vision.  Respiratory: Negative for cough and shortness of breath.  Cardiovascular: Positive for leg swelling. Negative for chest pain and palpitations.  Gastrointestinal: Positive for constipation. Negative for nausea and vomiting.  Genitourinary: Negative for dysuria and hematuria.  Musculoskeletal: Positive for myalgias and joint pain.   Right stump pain (improving) Skin: Negative for rash.  Neurological: Negative for seizures, loss of consciousness and headaches.  All other systems reviewed and are negative   Past Medical History  Diagnosis Date  . Hypertension   . Peripheral vascular disease (HCC)   . Gangrene (HCC) 11/22/2015    right great toe  . OSA on CPAP   . Type II diabetes mellitus (HCC) dx'd 11/2015  . PAD (peripheral artery disease) (HCC)    Past Surgical History  Procedure Laterality Date  . Peripheral vascular catheterization N/A 11/26/2015    Procedure: Abdominal Aortogram;  Surgeon: Christopher S Dickson, MD;  Location: [facility];  Service: Cardiovascular;  Laterality: N/A;  . Vasectomy    . Colonoscopy    . Femoral-popliteal bypass graft Right 11/28/2015    Procedure: RIGHT  FEMORAL-POPLITEAL ARTERY BYPASS GRAFT AND CONSTRUCTION OF MILLER CUFF USING 6 MM X 80 CM PROPATEN  GRAFT ;  Surgeon: Brian L Chen, MD;  Location: [facility] OR;  Service: Vascular;  Laterality: Right;  . Amputation Right 12/01/2015    Procedure: AMPUTATION RIGHT GREAT;  Surgeon: Brian L Chen, MD;  Location: [facility] OR;  Service: Vascular;  Laterality: Right;  . Amputation Right 12/16/2015    Procedure: AMPUTATION BELOW KNEE;  Surgeon: Todd F Early, MD;  Location: [facility] OR;  Service: Vascular;  Laterality: Right;  . Amputation Right 12/26/2015    Procedure: RIGHT ABOVE KNEE AMPUTATION;  Surgeon: Todd F Early, MD;  Location: [facility] OR;  Service: Vascular;  Laterality: Right;  .   Inguinal hernia repair Bilateral 2000   Family History    Problem Relation Age of Onset  . Hypertension Mother    Social History:  reports that he quit smoking about 4 years ago. His smoking use included Cigarettes. He has a 30 pack-year smoking history. He has never used smokeless tobacco. He reports that he drinks about 4.2 oz of alcohol per week. He reports that he does not use illicit drugs. Allergies: No Known Allergies Medications Prior to Admission  Medication Sig Dispense Refill  . aspirin 81 MG tablet Take 81 mg by mouth daily.    Marland Kitchen atorvastatin (LIPITOR) 10 MG tablet Take 1 tablet (10 mg total) by mouth daily. (Patient taking differently: Take 10 mg by mouth every other day. ) 90 tablet 3  . docusate sodium (COLACE) 100 MG capsule Take 100 mg by mouth 2 (two) times daily as needed for mild constipation.    Marland Kitchen lisinopril-hydrochlorothiazide (PRINZIDE,ZESTORETIC) 20-12.5 MG per tablet Take 1 tablet by mouth daily. 90 tablet 3  . Multiple Vitamin (MULTIVITAMIN) tablet Take 1 tablet by mouth daily.    Marland Kitchen oxyCODONE 10 MG TABS Take 1 tablet (10 mg total) by mouth every 4 (four) hours as needed for moderate pain. (Patient taking differently: Take 15 mg by mouth every 4 (four) hours as needed for moderate pain. ) 50 tablet 0  . oxyCODONE-acetaminophen (PERCOCET) 10-325 MG tablet Take 1 tablet by mouth every 4 (four) hours as needed for pain. 30 tablet 0  . oxyCODONE-acetaminophen (PERCOCET/ROXICET) 5-325 MG tablet Take 1-2 tablets by mouth every 6 (six) hours as needed for moderate pain. 30 tablet 0  . traMADol (ULTRAM) 50 MG tablet Take 1 tablet (50 mg total) by mouth every 8 (eight) hours as needed. (Patient not taking: Reported on 12/15/2015) 30 tablet 0    Home: [place] expects to be discharged to:: Private residence Living Arrangements: Spouse/significant other Available Help at Discharge: Family, Available 24 hours/day Type of Home: House Home Access: Stairs to enter [employer] of Steps: 4 from garage Entrance  Stairs-Rails: None Home Layout: Two level, Bed/bath upstairs, 1/2 bath on main level Alternate Level Stairs-Number of Steps: 15 then landing then 6 more Alternate Level Stairs-Rails: Left Bathroom Shower/Tub: Multimedia programmer: Standard Home Equipment: Environmental consultant - 2 wheels, Crutches, Shower seat - built in Additional Comments: Pt has 2 steps in garage w/o rails, has 3 steps in front of the house w/ Lt and Rt rails, and has 1 flight to get upstairs to his bedroom w/ Lt rail  Lives With: Spouse   Functional History: Prior Function Level of Independence: Independent with assistive device(s)  Functional Status:  Mobility: Bed Mobility Overal bed mobility: Needs Assistance Bed Mobility: Supine to Sit Supine to sit: Min guard, HOB elevated General bed mobility comments: Increased time, good use of UEs to scoot to EOB. No dizziness. Transfers Overall transfer level: Needs assistance Equipment used: Rolling walker (2 wheeled) Transfers: Sit to/from Stand Sit to Stand: Min assist, +2 safety/equipment Stand pivot transfers: Min assist, +2 safety/equipment General transfer comment: cues for hand placement, min assist to steady. SPT bed to chair with Min A for balance. Not able to extend right residual limb to neutral despite cues. Pain limiting.      ADL: ADL Overall ADL's : Needs assistance/impaired Eating/Feeding: Set up, Sitting Grooming: Set up, Sitting Upper Body Bathing: Set up, Sitting Lower Body Bathing: Sit to/from stand, Maximal assistance Upper Body Dressing : Set up, Sitting Lower Body Dressing:  Maximal assistance, Sit to/from stand Toilet Transfer: Minimal assistance, +2 for safety/equipment, Stand-pivot, RW Toilet Transfer Details (indicate cue type and reason): simulated bed to chair General ADL Comments: Pt fatigued, reporting having not slept well last night, but still agreeable to OOB to chair.  Cognition: Cognition Overall Cognitive Status: Within  Functional Limits for tasks assessed Orientation Level: Oriented X4 Cognition Arousal/Alertness: Awake/alert Behavior During Therapy: WFL for tasks assessed/performed Overall Cognitive Status: Within Functional Limits for tasks assessed  Physical Exam: Blood pressure 128/63, pulse 56, temperature 98.1 F (36.7 C), temperature source Oral, resp. rate 18, SpO2 99 %. Physical Exam  Vitals reviewed. Constitutional: He is oriented to person, place, and time. He appears well-developed and well-nourished.  HENT:  Head: Normocephalic and atraumatic.  Eyes: Conjunctivae and EOM are normal.  Neck: Normal range of motion. Neck supple. No thyromegaly present.  Cardiovascular: Normal rate and regular rhythm.   Respiratory: Effort normal and breath sounds normal. No respiratory distress.  GI: Soft. Bowel sounds are normal. He exhibits no distension.  Musculoskeletal: He exhibits edema and tenderness.  Neurological: He is alert and oriented to person, place, and time.  Motor: B/l UE, LLE 5/5 RLE: hip flexion 3/5 (pain inhibition)  Skin: Skin is warm and dry.  Dressing in place to AKA appropriately tender without drainage  Psychiatric: He has a normal mood and affect. His behavior is normal. Thought content normal.    Results for orders placed or performed during the hospital encounter of 12/26/15 (from the past 48 hour(s))  Glucose, capillary     Status: None   Collection Time: 12/26/15  3:35 PM  Result Value Ref Range   Glucose-Capillary 96 65 - 99 mg/dL  CBC     Status: Abnormal   Collection Time: 12/26/15  6:52 PM  Result Value Ref Range   WBC 10.3 4.0 - 10.5 K/uL   RBC 3.69 (L) 4.22 - 5.81 MIL/uL   Hemoglobin 11.1 (L) 13.0 - 17.0 g/dL   HCT 33.4 (L) 39.0 - 52.0 %   MCV 90.5 78.0 - 100.0 fL   MCH 30.1 26.0 - 34.0 pg   MCHC 33.2 30.0 - 36.0 g/dL   RDW 12.7 11.5 - 15.5 %   Platelets 411 (H) 150 - 400 K/uL  Creatinine, serum     Status: None   Collection Time: 12/26/15  6:52 PM    Result Value Ref Range   Creatinine, Ser 0.70 0.61 - 1.24 mg/dL   GFR calc non Af Amer >60 >60 mL/min   GFR calc Af Amer >60 >60 mL/min    Comment: (NOTE) The eGFR has been calculated using the CKD EPI equation. This calculation has not been validated in all clinical situations. eGFR's persistently <60 mL/min signify possible Chronic Kidney Disease.   Glucose, capillary     Status: None   Collection Time: 12/26/15  7:53 PM  Result Value Ref Range   Glucose-Capillary 94 65 - 99 mg/dL  Basic metabolic panel     Status: None   Collection Time: 12/27/15  5:18 AM  Result Value Ref Range   Sodium 137 135 - 145 mmol/L   Potassium 4.9 3.5 - 5.1 mmol/L   Chloride 101 101 - 111 mmol/L   CO2 27 22 - 32 mmol/L   Glucose, Bld 95 65 - 99 mg/dL   BUN 12 6 - 20 mg/dL   Creatinine, Ser 0.65 0.61 - 1.24 mg/dL   Calcium 8.9 8.9 - 10.3 mg/dL   GFR calc non Af  Amer >60 >60 mL/min   GFR calc Af Amer >60 >60 mL/min    Comment: (NOTE) The eGFR has been calculated using the CKD EPI equation. This calculation has not been validated in all clinical situations. eGFR's persistently <60 mL/min signify possible Chronic Kidney Disease.    Anion gap 9 5 - 15  CBC     Status: Abnormal   Collection Time: 12/27/15  5:18 AM  Result Value Ref Range   WBC 12.4 (H) 4.0 - 10.5 K/uL   RBC 3.75 (L) 4.22 - 5.81 MIL/uL   Hemoglobin 11.5 (L) 13.0 - 17.0 g/dL   HCT 34.0 (L) 39.0 - 52.0 %   MCV 90.7 78.0 - 100.0 fL   MCH 30.7 26.0 - 34.0 pg   MCHC 33.8 30.0 - 36.0 g/dL   RDW 12.5 11.5 - 15.5 %   Platelets 479 (H) 150 - 400 K/uL  Glucose, capillary     Status: None   Collection Time: 12/27/15  6:16 AM  Result Value Ref Range   Glucose-Capillary 93 65 - 99 mg/dL  Basic metabolic panel     Status: Abnormal   Collection Time: 12/28/15  2:40 AM  Result Value Ref Range   Sodium 138 135 - 145 mmol/L   Potassium 4.3 3.5 - 5.1 mmol/L   Chloride 99 (L) 101 - 111 mmol/L   CO2 29 22 - 32 mmol/L   Glucose, Bld 118 (H)  65 - 99 mg/dL   BUN 16 6 - 20 mg/dL   Creatinine, Ser 0.69 0.61 - 1.24 mg/dL   Calcium 8.9 8.9 - 10.3 mg/dL   GFR calc non Af Amer >60 >60 mL/min   GFR calc Af Amer >60 >60 mL/min    Comment: (NOTE) The eGFR has been calculated using the CKD EPI equation. This calculation has not been validated in all clinical situations. eGFR's persistently <60 mL/min signify possible Chronic Kidney Disease.    Anion gap 10 5 - 15  CBC     Status: Abnormal   Collection Time: 12/28/15  2:40 AM  Result Value Ref Range   WBC 10.9 (H) 4.0 - 10.5 K/uL   RBC 3.75 (L) 4.22 - 5.81 MIL/uL   Hemoglobin 10.9 (L) 13.0 - 17.0 g/dL   HCT 34.2 (L) 39.0 - 52.0 %   MCV 91.2 78.0 - 100.0 fL   MCH 29.1 26.0 - 34.0 pg   MCHC 31.9 30.0 - 36.0 g/dL   RDW 12.8 11.5 - 15.5 %   Platelets 459 (H) 150 - 400 K/uL   Dg Chest Port 1 View  12/26/2015  CLINICAL DATA:  Preoperative examination prior foot surgery, no current chest complaints, former smoker. EXAM: PORTABLE CHEST 1 VIEW COMPARISON:  None in PACs FINDINGS: The lungs are adequately inflated and clear. The heart and pulmonary vascularity are normal. The mediastinum is normal in width. The bony thorax is unremarkable. IMPRESSION: There is no active cardiopulmonary disease. Electronically Signed   By: David  Jordan M.D.   On: 12/26/2015 07:49    Medical Problem List and Plan: 1.  Right AKA 12/26/2015 after failed BKA secondary to progressive ischemic changes with peripheral vascular disease 2.  DVT Prophylaxis/Anticoagulation: Subcutaneous Lovenox. Monitor platelet counts and any signs of bleeding 3. Pain Management: Celebrex 200 mg twice a day, fentanyl patch 50 g change every 72 hours, oxycodone 15 mg every 4 hours and Flexeril as needed 4. Acute blood loss anemia. Follow-up CBC 5. Neuropsych: This patient is capable of making decisions on   his own behalf. 6. Skin/Wound Care: Routine skin checks 7. Fluids/Electrolytes/Nutrition: Routine I&O with follow-up  chemistries 8. Hypertension. Lisinopril 20 mg daily, HCTZ 12.5 mg daily. Monitor with increased mobility 9. Hyperlipidemia. Lipitor 10. Diet-controlled diabetes mellitus. Hemoglobin A1c 5.7. Blood sugar checks discontinued averaging 93-105  Post Admission Physician Evaluation: 1. Functional deficits secondary  to right AKA after failed BKA. 2. Patient is admitted to receive collaborative, interdisciplinary care between the physiatrist, rehab nursing staff, and therapy team. 3. Patient's level of medical complexity and substantial therapy needs in context of that medical necessity cannot be provided at a lesser intensity of care such as a SNF. 4. Patient has experienced substantial functional loss from his/her baseline which was documented above under the "Functional History" and "Functional Status" headings.  Judging by the patient's diagnosis, physical exam, and functional history, the patient has potential for functional progress which will result in measurable gains while on inpatient rehab.  These gains will be of substantial and practical use upon discharge  in facilitating mobility and self-care at the household level. 5. Physiatrist will provide 24 hour management of medical needs as well as oversight of the therapy plan/treatment and provide guidance as appropriate regarding the interaction of the two. 6. 24 hour rehab nursing will assist with safety, skin/wound care, disease management, pain management and patient education and help integrate therapy concepts, techniques,education, etc. 7. PT will assess and treat for/with: Lower extremity strength, range of motion, stamina, balance, functional mobility, safety, adaptive techniques and equipment, woundcare, coping skills, pain control, education.   Goals are: Mod I. 8. OT will assess and treat for/with: ADL's, functional mobility, safety, upper extremity strength, adaptive techniques and equipment, wound mgt, ego support, and community  reintegration.   Goals are: Mod I. Therapy may not proceed with showering this patient. 9. Case Management and Social Worker will assess and treat for psychological issues and discharge planning. 10. Team conference will be held weekly to assess progress toward goals and to determine barriers to discharge. 11. Patient will receive at least 3 hours of therapy per day at least 5 days per week. 12. ELOS: 5-7 days.       13. Prognosis:  excellent  Delice Lesch, MD 12/28/2015

## 2015-12-29 ENCOUNTER — Encounter (HOSPITAL_COMMUNITY): Payer: Self-pay

## 2015-12-29 ENCOUNTER — Inpatient Hospital Stay (HOSPITAL_COMMUNITY)
Admission: EM | Admit: 2015-12-29 | Discharge: 2016-01-01 | DRG: 560 | Disposition: A | Discharge status: Home Health | Payer: Medicare Other | Source: Intra-hospital | Attending: Physical Medicine & Rehabilitation | Admitting: Physical Medicine & Rehabilitation

## 2015-12-29 DIAGNOSIS — Z4781 Encounter for orthopedic aftercare following surgical amputation: Secondary | ICD-10-CM | POA: Diagnosis not present

## 2015-12-29 DIAGNOSIS — S78111A Complete traumatic amputation at level between right hip and knee, initial encounter: Secondary | ICD-10-CM

## 2015-12-29 DIAGNOSIS — Z79899 Other long term (current) drug therapy: Secondary | ICD-10-CM

## 2015-12-29 DIAGNOSIS — R740 Nonspecific elevation of levels of transaminase and lactic acid dehydrogenase [LDH]: Secondary | ICD-10-CM

## 2015-12-29 DIAGNOSIS — I10 Essential (primary) hypertension: Secondary | ICD-10-CM | POA: Diagnosis present

## 2015-12-29 DIAGNOSIS — Z89611 Acquired absence of right leg above knee: Secondary | ICD-10-CM

## 2015-12-29 DIAGNOSIS — E785 Hyperlipidemia, unspecified: Secondary | ICD-10-CM | POA: Diagnosis present

## 2015-12-29 DIAGNOSIS — E1151 Type 2 diabetes mellitus with diabetic peripheral angiopathy without gangrene: Secondary | ICD-10-CM | POA: Diagnosis present

## 2015-12-29 DIAGNOSIS — G546 Phantom limb syndrome with pain: Secondary | ICD-10-CM | POA: Diagnosis present

## 2015-12-29 DIAGNOSIS — G8929 Other chronic pain: Secondary | ICD-10-CM | POA: Diagnosis present

## 2015-12-29 DIAGNOSIS — G894 Chronic pain syndrome: Secondary | ICD-10-CM

## 2015-12-29 DIAGNOSIS — D62 Acute posthemorrhagic anemia: Secondary | ICD-10-CM | POA: Diagnosis present

## 2015-12-29 DIAGNOSIS — I739 Peripheral vascular disease, unspecified: Secondary | ICD-10-CM

## 2015-12-29 DIAGNOSIS — K5900 Constipation, unspecified: Secondary | ICD-10-CM | POA: Diagnosis present

## 2015-12-29 DIAGNOSIS — M792 Neuralgia and neuritis, unspecified: Secondary | ICD-10-CM | POA: Diagnosis not present

## 2015-12-29 DIAGNOSIS — E1142 Type 2 diabetes mellitus with diabetic polyneuropathy: Secondary | ICD-10-CM | POA: Diagnosis present

## 2015-12-29 DIAGNOSIS — E1152 Type 2 diabetes mellitus with diabetic peripheral angiopathy with gangrene: Secondary | ICD-10-CM | POA: Diagnosis not present

## 2015-12-29 DIAGNOSIS — E1169 Type 2 diabetes mellitus with other specified complication: Secondary | ICD-10-CM

## 2015-12-29 DIAGNOSIS — Z87891 Personal history of nicotine dependence: Secondary | ICD-10-CM

## 2015-12-29 DIAGNOSIS — Z7982 Long term (current) use of aspirin: Secondary | ICD-10-CM | POA: Diagnosis not present

## 2015-12-29 DIAGNOSIS — R269 Unspecified abnormalities of gait and mobility: Secondary | ICD-10-CM | POA: Diagnosis not present

## 2015-12-29 DIAGNOSIS — G8918 Other acute postprocedural pain: Secondary | ICD-10-CM | POA: Diagnosis not present

## 2015-12-29 DIAGNOSIS — G4733 Obstructive sleep apnea (adult) (pediatric): Secondary | ICD-10-CM | POA: Diagnosis present

## 2015-12-29 MED ORDER — DOCUSATE SODIUM 100 MG PO CAPS
100.0000 mg | ORAL_CAPSULE | Freq: Every day | ORAL | Status: DC
  Administered 2015-12-30 – 2016-01-01 (×3): 100 mg via ORAL
  Filled 2015-12-29 (×3): qty 1

## 2015-12-29 MED ORDER — ACETAMINOPHEN 650 MG RE SUPP: 325.0000 mg | RECTAL | Status: DC | PRN

## 2015-12-29 MED ORDER — ONDANSETRON HCL 4 MG PO TABS: 4.0000 mg | ORAL_TABLET | Freq: Four times a day (QID) | ORAL | Status: DC | PRN

## 2015-12-29 MED ORDER — ENOXAPARIN SODIUM 30 MG/0.3ML ~~LOC~~ SOLN
30.0000 mg | SUBCUTANEOUS | Status: DC
  Administered 2015-12-29 – 2015-12-31 (×3): 30 mg via SUBCUTANEOUS
  Filled 2015-12-29 (×3): qty 0.3

## 2015-12-29 MED ORDER — ACETAMINOPHEN 325 MG PO TABS: 325.0000 mg | ORAL_TABLET | ORAL | Status: DC | PRN

## 2015-12-29 MED ORDER — PANTOPRAZOLE SODIUM 40 MG PO TBEC
40.0000 mg | DELAYED_RELEASE_TABLET | Freq: Every day | ORAL | Status: DC
  Administered 2015-12-30 – 2016-01-01 (×3): 40 mg via ORAL
  Filled 2015-12-29 (×3): qty 1

## 2015-12-29 MED ORDER — CELECOXIB 200 MG PO CAPS
200.0000 mg | ORAL_CAPSULE | Freq: Two times a day (BID) | ORAL | Status: DC
  Administered 2015-12-29 – 2016-01-01 (×6): 200 mg via ORAL
  Filled 2015-12-29 (×9): qty 1

## 2015-12-29 MED ORDER — CYCLOBENZAPRINE HCL 5 MG PO TABS: 5.0000 mg | ORAL_TABLET | Freq: Three times a day (TID) | ORAL | Status: DC | PRN

## 2015-12-29 MED ORDER — HYDROCHLOROTHIAZIDE 12.5 MG PO CAPS
12.5000 mg | ORAL_CAPSULE | Freq: Every day | ORAL | Status: DC
  Administered 2015-12-30 – 2016-01-01 (×3): 12.5 mg via ORAL
  Filled 2015-12-29 (×3): qty 1

## 2015-12-29 MED ORDER — ATORVASTATIN CALCIUM 10 MG PO TABS
10.0000 mg | ORAL_TABLET | ORAL | Status: DC
  Administered 2015-12-30: 10 mg via ORAL
  Filled 2015-12-29 (×2): qty 1

## 2015-12-29 MED ORDER — OXYCODONE HCL 5 MG PO TABS: 20.0000 mg | ORAL_TABLET | ORAL | Status: DC | PRN

## 2015-12-29 MED ORDER — ASPIRIN 81 MG PO CHEW
81.0000 mg | CHEWABLE_TABLET | Freq: Every day | ORAL | Status: DC
  Administered 2015-12-30 – 2016-01-01 (×3): 81 mg via ORAL
  Filled 2015-12-29 (×3): qty 1

## 2015-12-29 MED ORDER — ONDANSETRON HCL 4 MG/2ML IJ SOLN: 4.0000 mg | Freq: Four times a day (QID) | INTRAMUSCULAR | Status: DC | PRN

## 2015-12-29 MED ORDER — FENTANYL 50 MCG/HR TD PT72: 50.0000 ug | MEDICATED_PATCH | TRANSDERMAL | Status: DC

## 2015-12-29 MED ORDER — FENTANYL 25 MCG/HR TD PT72
50.0000 ug | MEDICATED_PATCH | TRANSDERMAL | Status: DC
  Administered 2015-12-31: 50 ug via TRANSDERMAL
  Filled 2015-12-29: qty 2

## 2015-12-29 MED ORDER — ADULT MULTIVITAMIN W/MINERALS CH
1.0000 | ORAL_TABLET | Freq: Every day | ORAL | Status: DC
Start: 2015-12-30 — End: 2016-01-01
  Administered 2015-12-30 – 2016-01-01 (×3): 1 via ORAL
  Filled 2015-12-29 (×3): qty 1

## 2015-12-29 MED ORDER — CELECOXIB 200 MG PO CAPS
200.0000 mg | ORAL_CAPSULE | Freq: Two times a day (BID) | ORAL | Status: DC
Start: 2015-12-29 — End: 2016-01-01

## 2015-12-29 MED ORDER — ENOXAPARIN SODIUM 30 MG/0.3ML ~~LOC~~ SOLN: 30.0000 mg | SUBCUTANEOUS | Status: DC

## 2015-12-29 MED ORDER — OXYCODONE HCL 5 MG PO TABS
15.0000 mg | ORAL_TABLET | ORAL | Status: DC | PRN
  Administered 2015-12-29 – 2015-12-31 (×10): 15 mg via ORAL
  Filled 2015-12-29 (×10): qty 3

## 2015-12-29 MED ORDER — LISINOPRIL 20 MG PO TABS
20.0000 mg | ORAL_TABLET | Freq: Every day | ORAL | Status: DC
  Administered 2015-12-30 – 2016-01-01 (×3): 20 mg via ORAL
  Filled 2015-12-29 (×3): qty 1

## 2015-12-29 MED ORDER — SORBITOL 70 % SOLN: 30.0000 mL | Freq: Every day | Status: DC | PRN

## 2015-12-29 NOTE — Progress Notes (Addendum)
[facility]  VASCULAR SURGERY ASSESSMENT AND PLAN:  Agree with no below. For transfer to  inpatient rehabilitation today.  Deitra Mayo, MD, FACS Beeper [phone]  Subjective  - If patient can wean off PCA today he can go to CIR.    Objective 108/69 54 97.9 F (36.6 C) (Oral) 14 99%  Intake/Output Summary (Last 24 hours) at 12/29/15 0659 Last data filed at 12/28/15 2311  Gross per 24 hour  Intake    480 ml  Output    800 ml  Net   -320 ml   Right AKA C/D/I incision  Assessment/Planning: POD # 3 s/p right above knee amputation 2 Days Post-Op revised from BKA.  Recommendations:  Per pharmacy help 1. Add Fentanyl 56mcg patch q72h to patient profile - patch change due 2/10. He currently has a patch applied although it was originally discontinued post-op. 2. Add Celebrex 200mg  PO BID. 3. Discontinue Morphine IV and Percocet - therapeutic duplication. 4. Continue Oxycodine at current 15mg  dose while on PCA. In the past however, patient did require doses of Oxycodone 20-25mg  q4h (scheduled) for pain control when off the PCA. 5. Continue Flexeril 5mg  PO q8h prn. Consider increasing to 10mg  q8h prn if needed when weaning off the PCA.  Plan discharge to CIR later today  Laurence Slate [facility] 12/29/2015 6:59 AM --  Laboratory Lab Results:  Recent Labs  12/27/15 0518 12/28/15 0240  WBC 12.4* 10.9*  HGB 11.5* 10.9*  HCT 34.0* 34.2*  PLT 479* 459*   BMET  Recent Labs  12/27/15 0518 12/28/15 0240  NA 137 138  K 4.9 4.3  CL 101 99*  CO2 27 29  GLUCOSE 95 118*  BUN 12 16  CREATININE 0.65 0.69  CALCIUM 8.9 8.9    COAG Lab Results  Component Value Date   INR 1.07 12/16/2015   INR 1.13 11/26/2015   No results found for: PTT

## 2015-12-29 NOTE — Progress Notes (Signed)
Patient has home CPAP, Setup and places himself on.

## 2015-12-29 NOTE — Progress Notes (Signed)
Pt arrived on floor accompanied by RN and NT. Oriented to room and Rehab unit. RN states she gave Oxy 15 at 11 am. Pt not complaining of pain. Resting comfortably.

## 2015-12-29 NOTE — Progress Notes (Signed)
Dilaudid PCA 23cc wasted to sink. Witnessed by [employer]

## 2015-12-30 ENCOUNTER — Inpatient Hospital Stay (HOSPITAL_COMMUNITY): Payer: No Typology Code available for payment source | Admitting: Occupational Therapy

## 2015-12-30 ENCOUNTER — Inpatient Hospital Stay (HOSPITAL_COMMUNITY): Payer: No Typology Code available for payment source | Admitting: Physical Therapy

## 2015-12-30 DIAGNOSIS — I10 Essential (primary) hypertension: Secondary | ICD-10-CM

## 2015-12-30 DIAGNOSIS — I739 Peripheral vascular disease, unspecified: Secondary | ICD-10-CM

## 2015-12-30 DIAGNOSIS — R269 Unspecified abnormalities of gait and mobility: Secondary | ICD-10-CM

## 2015-12-30 DIAGNOSIS — E1152 Type 2 diabetes mellitus with diabetic peripheral angiopathy with gangrene: Secondary | ICD-10-CM

## 2015-12-30 DIAGNOSIS — E785 Hyperlipidemia, unspecified: Secondary | ICD-10-CM

## 2015-12-30 DIAGNOSIS — G8918 Other acute postprocedural pain: Secondary | ICD-10-CM

## 2015-12-30 DIAGNOSIS — G894 Chronic pain syndrome: Secondary | ICD-10-CM

## 2015-12-30 DIAGNOSIS — R740 Nonspecific elevation of levels of transaminase and lactic acid dehydrogenase [LDH]: Secondary | ICD-10-CM

## 2015-12-30 DIAGNOSIS — D62 Acute posthemorrhagic anemia: Secondary | ICD-10-CM

## 2015-12-30 DIAGNOSIS — Z89611 Acquired absence of right leg above knee: Secondary | ICD-10-CM

## 2015-12-30 NOTE — Discharge Summary (Signed)
[facility] Discharge Summary   Patient ID:  Adam [name] MRN: [id number] DOB/AGE: 66-Aug-1952 66 y.o.  Admit date: 12/26/2015 Discharge date: 12/29/2015 Date of Surgery: 12/26/2015 Surgeon: Surgeon(s): [name], MD  Admission Diagnosis: Nonhealing right below knee amputation  T81.89A  Discharge Diagnoses:  Nonhealing right below knee amputation  T81.89A  Secondary Diagnoses: Past Medical History  Diagnosis Date  . Hypertension   . Peripheral vascular disease ([place])   . Gangrene ([place]) 11/22/2015    right great toe  . OSA on CPAP   . Type II diabetes mellitus ([place]) dx'd 11/2015  . PAD (peripheral artery disease) (HCC)     Procedure(s): RIGHT ABOVE KNEE AMPUTATION  Discharged Condition: good  HPI: Non healing BKA s/p right femoral to below-knee popliteal bypass on 11/28/2015. He underwent amputation of his right great toe on 12/01/2015.     Hospital Course:  Adam [name] is a 65 y.o. male is S/P { Procedure(s): RIGHT ABOVE KNEE AMPUTATION  POD#1-2  Pain management issues, stump viable  POD # 3 s/p right above knee amputation 2 Days Post-Op revised from BKA.  Recommendations: Per pharmacy help 1. Add Fentanyl 70mcg patch q72h to patient profile - patch change due 2/10. He currently has a patch applied although it was originally discontinued post-op. 2. Add Celebrex 200mg  PO BID. 3. Discontinue Morphine IV and Percocet - therapeutic duplication. 4. Continue Oxycodine at current 15mg  dose while on PCA. In the past however, patient did require doses of Oxycodone 20-25mg  q4h (scheduled) for pain control when off the PCA. 5. Continue Flexeril 5mg  PO q8h prn. Consider increasing to 10mg  q8h prn if needed when weaning off the PCA.  Plan discharge to CIR later today    Significant Diagnostic Studies: CBC Lab Results  Component Value Date   WBC 10.9* 12/28/2015   HGB 10.9* 12/28/2015   HCT 34.2* 12/28/2015   MCV 91.2 12/28/2015   PLT 459*  12/28/2015    BMET    Component Value Date/Time   NA 138 12/28/2015 0240   K 4.3 12/28/2015 0240   CL 99* 12/28/2015 0240   CO2 29 12/28/2015 0240   GLUCOSE 118* 12/28/2015 0240   BUN 16 12/28/2015 0240   CREATININE 0.69 12/28/2015 0240   CREATININE 0.82 11/22/2015 0001   CALCIUM 8.9 12/28/2015 0240   GFRNONAA >60 12/28/2015 0240   GFRAA >60 12/28/2015 0240   COAG Lab Results  Component Value Date   INR 1.07 12/16/2015   INR 1.13 11/26/2015     Disposition:  Discharge to :Rehab Discharge Instructions    Call MD for:  redness, tenderness, or signs of infection (pain, swelling, bleeding, redness, odor or green/yellow discharge around incision site)    Complete by:  As directed      Call MD for:  severe or increased pain, loss or decreased feeling  in affected limb(s)    Complete by:  As directed      Call MD for:  temperature >100.5    Complete by:  As directed      Discharge instructions    Complete by:  As directed   You may shower daily     Resume previous diet    Complete by:  As directed             Medication List    STOP taking these medications        traMADol 50 MG tablet  Commonly known as:  Veatrice Bourbon  TAKE these medications        aspirin 81 MG tablet  Take 81 mg by mouth daily.     atorvastatin 10 MG tablet  Commonly known as:  LIPITOR  Take 1 tablet (10 mg total) by mouth daily.     celecoxib 200 MG capsule  Commonly known as:  CELEBREX  Take 1 capsule (200 mg total) by mouth 2 (two) times daily.     cyclobenzaprine 5 MG tablet  Commonly known as:  FLEXERIL  Take 1-2 tablets (5-10 mg total) by mouth 3 (three) times daily as needed for muscle spasms.     docusate sodium 100 MG capsule  Commonly known as:  COLACE  Take 100 mg by mouth 2 (two) times daily as needed for mild constipation.     fentaNYL 50 MCG/HR  Commonly known as:  DURAGESIC - dosed mcg/hr  Place 1 patch (50 mcg total) onto the skin every 3 (three) days.      lisinopril-hydrochlorothiazide 20-12.5 MG tablet  Commonly known as:  PRINZIDE,ZESTORETIC  Take 1 tablet by mouth daily.     multivitamin tablet  Take 1 tablet by mouth daily.     Oxycodone HCl 10 MG Tabs  Take 1 tablet (10 mg total) by mouth every 4 (four) hours as needed for moderate pain.     oxyCODONE 5 MG immediate release tablet  Commonly known as:  Oxy IR/ROXICODONE  Take 4-5 tablets (20-25 mg total) by mouth every 4 (four) hours as needed for moderate pain.     oxyCODONE-acetaminophen 5-325 MG tablet  Commonly known as:  PERCOCET/ROXICET  Take 1-2 tablets by mouth every 6 (six) hours as needed for moderate pain.     oxyCODONE-acetaminophen 10-325 MG tablet  Commonly known as:  PERCOCET  Take 1 tablet by mouth every 4 (four) hours as needed for pain.       Verbal and written Discharge instructions given to the patient. Wound care per Discharge AVS  Plan f/u in the office in 4 weeks from AKA with Dr. Donnetta Hutching Signed: Theda Sers, Jaleigh Mccroskey [facility] 12/30/2015, 7:05 AM

## 2015-12-30 NOTE — Evaluation (Addendum)
Physical Therapy Assessment and Plan  Patient Details  Name: Adam [name] MRN: [id number] Date of Birth: 25-Mar-1951  PT Diagnosis: Difficulty walking and Muscle weakness Rehab Potential: Good ELOS: 3 days  Today's Date: 12/30/2015 PT Individual Time: 1345-1445 PT Individual Time Calculation (min): 60 min    Problem List:  Patient Active Problem List   Diagnosis Date Noted  . PVD (peripheral vascular disease) ([place])   . Benign essential HTN   . HLD (hyperlipidemia)   . Type 2 diabetes mellitus with diabetic peripheral angiopathy and gangrene, without long-term current use of insulin ([place])   . Above knee amputation of right lower extremity ([place]) 12/29/2015  . Constipation due to pain medication   . Bleeding   . Hyponatremia   . Leukocytosis   . Transaminitis   . Chronic pain syndrome   . Pressure ulcer 12/20/2015  . Vascular insufficiency   . Abnormality of gait   . Status post below knee amputation of right lower extremity ([place])   . DM type 2 with diabetic peripheral neuropathy ([place])   . Essential hypertension   . SIRS (systemic inflammatory response syndrome) ([facility])   . Post-operative pain   . Acute blood loss anemia   . PAD (peripheral artery disease) ([place]) 12/15/2015  . Peripheral vascular disease, unspecified ([place]) 12/07/2015  . Hyperlipidemia associated with type 2 diabetes mellitus ([place]) 12/07/2015  . Great toe amputation status ([place]) 12/07/2015  . Atherosclerosis of native arteries of left leg with ulceration of unspecified site ([place]) 11/28/2015  . Atherosclerosis of native arteries of the extremities with ulceration ([place]) 11/26/2015  . Atherosclerosis of native arteries of the extremities with gangrene ([place]) 11/23/2015  . New onset type 2 diabetes mellitus ([place]) 11/22/2015  . Hypertension associated with diabetes ([place]) 12/04/2014    Past Medical History:  Past Medical History  Diagnosis Date  . Hypertension   . Peripheral vascular disease ([place])   . Gangrene  ([place]) 11/22/2015    right great toe  . OSA on CPAP   . Type II diabetes mellitus ([place]) dx'd 11/2015  . PAD (peripheral artery disease) ([place])    Past Surgical History:  Past Surgical History  Procedure Laterality Date  . Peripheral vascular catheterization N/A 11/26/2015    Procedure: Abdominal Aortogram;  Surgeon: [name], MD;  Location: [facility];  Service: Cardiovascular;  Laterality: N/A;  . Vasectomy    . Colonoscopy    . Femoral-popliteal bypass graft Right 11/28/2015    Procedure: RIGHT  FEMORAL-POPLITEAL ARTERY BYPASS GRAFT AND CONSTRUCTION OF MILLER CUFF USING 6 MM X 80 CM PROPATEN  GRAFT ;  Surgeon: Conrad Granville, MD;  Location: [facility];  Service: Vascular;  Laterality: Right;  . Amputation Right 12/01/2015    Procedure: AMPUTATION RIGHT GREAT;  Surgeon: Conrad Granville, MD;  Location: [facility];  Service: Vascular;  Laterality: Right;  . Amputation Right 12/16/2015    Procedure: AMPUTATION BELOW KNEE;  Surgeon: [name], MD;  Location: [facility];  Service: Vascular;  Laterality: Right;  . Amputation Right 12/26/2015    Procedure: RIGHT ABOVE KNEE AMPUTATION;  Surgeon: [name], MD;  Location: [facility];  Service: Vascular;  Laterality: Right;  . Inguinal hernia repair Bilateral 2000    Assessment & Plan Clinical Impression: Patient is a 65 y.o. year old male with recent admission to the hospital on 12/15/2015 with severe right foot pain and poor healing of recent right great toe amputation with profound ischemic changes. Limb  was not felt to be salvageable and underwent right below-knee amputation 12/16/2015 per Dr. Donnetta Hutching. Patient was admitted for a comprehensive rehabilitation program 12/20/2015. Patient progressing nicely with therapies. Follow-up vascular surgery noting progressive ischemic changes nonhealing right BKA site felt AKA was needed. Patient was discharged to the services of vascular surgery 12/26/2015 and underwent right AKA for Dr. Donnetta Hutching. Patient transferred to CIR  on 12/29/2015 .   Patient currently requires min with mobility secondary to muscle weakness and decreased standing balance and decreased balance strategies.  Prior to hospitalization, patient was independent  with mobility and lived with Spouse in a House home.  Home access is 2Stairs to enter.  Patient will benefit from skilled PT intervention to maximize safe functional mobility, minimize fall risk and decrease caregiver burden for planned discharge home with intermittent assist.  Anticipate patient will benefit from follow up [facility] at discharge.  PT - End of Session Activity Tolerance: Tolerates 30+ min activity with multiple rests Endurance Deficit: Yes PT Assessment Rehab Potential (ACUTE/IP ONLY): Good Barriers to Discharge: Inaccessible home environment Barriers to Discharge Comments: 2 story house PT Patient demonstrates impairments in the following area(s): Balance;Endurance;Motor;Pain;Safety PT Transfers Functional Problem(s): Bed Mobility;Bed to Chair;Car;Furniture PT Locomotion Functional Problem(s): Stairs;Wheelchair Mobility;Ambulation PT Plan PT Intensity: Minimum of 1-2 x/day ,45 to 90 minutes PT Frequency: 5 out of 7 days PT Duration Estimated Length of Stay: 3 PT Treatment/Interventions: Ambulation/gait training;DME/adaptive equipment instruction;Neuromuscular re-education;Community reintegration;Stair training;UE/LE Strength taining/ROM;Wheelchair propulsion/positioning;Balance/vestibular training;Discharge planning;Therapeutic Activities;UE/LE Coordination activities;Functional mobility training;Patient/family education;Therapeutic Exercise;Splinting/orthotics;Pain management PT Transfers Anticipated Outcome(s): mod I PT Locomotion Anticipated Outcome(s): mod I w/c, supervision stairs PT Recommendation Follow Up Recommendations: Home health PT Patient destination: Home Equipment Recommended: Wheelchair (measurements) Equipment Details: 18x18, basic cushion  Skilled  Therapeutic Intervention Pt able to perform squat pivot transfers w/c <> bed and w/c <> mat with min guard, cues for set up and safety.  Gait with RW 2 x50' with min guard, no LOB, 1 standing rest break during gait due to UE fatigue.  Stair negotiation with 2 handrails, 2 steps with min A, pt with heavy reliance on railings to hop up.  Encouraged pt to be sure rails are VERY sturdy when installed at his home.  Car transfer to simulated small SUV with min A, cues for sequencing and safety.  Pt given HEP handouts of UE therex with theraband and LE exercises.  Pt states he recalls some exercises from his previous stay.  PT Evaluation Precautions/Restrictions Precautions Precautions: Fall Precaution Comments: R AKA Restrictions Weight Bearing Restrictions: Yes RLE Weight Bearing: Non weight bearing Pain Pain Assessment Pain Assessment: No/denies pain  Home Living/Prior Functioning Home Living Available Help at Discharge: Family;Available 24 hours/day Type of Home: House Home Access: Stairs to enter [employer] of Steps: 2 Entrance Stairs-Rails: Can reach both Home Layout: Two level;Bed/bath upstairs;1/2 bath on main level Alternate Level Stairs-Number of Steps: 15 then landing then 6 more Alternate Level Stairs-Rails: Left Bathroom Shower/Tub: Multimedia programmer: Standard  Lives With: Spouse Prior Function Level of Independence: Independent with gait  Able to Take Stairs?: Yes Driving: Yes 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  Cognition Overall Cognitive Status: Within Functional Limits for tasks assessed Arousal/Alertness: Awake/alert Orientation Level: Oriented X4 Selective Attention: Appears intact Memory: Appears intact Awareness: Appears intact Problem Solving: Appears intact Safety/Judgment: Appears intact Sensation Sensation Light Touch: Appears Intact Stereognosis: Appears Intact Hot/Cold:  Appears Intact Proprioception: Appears Intact Coordination Gross Motor Movements are Fluid and Coordinated:  Yes Fine Motor Movements are Fluid and Coordinated: Yes Motor  Motor Motor: Within Functional Limits Motor - Skilled Clinical Observations: generalized weakness   Trunk/Postural Assessment  Cervical Assessment Cervical Assessment: Within Functional Limits Thoracic Assessment Thoracic Assessment: Within Functional Limits Lumbar Assessment Lumbar Assessment: Within Functional Limits Postural Control Postural Control: Within Functional Limits  Balance Dynamic Sitting Balance Dynamic Sitting - Balance Support: Left upper extremity supported;Right upper extremity supported;During functional activity Dynamic Sitting - Level of Assistance: 4: Min assist Sitting balance - Comments: LB dressing Static Standing Balance Static Standing - Balance Support: Bilateral upper extremity supported Static Standing - Level of Assistance: 5: Stand by assistance Dynamic Standing Balance Dynamic Standing - Level of Assistance: 4: Min assist Extremity Assessment  RUE Assessment RUE Assessment: Within Functional Limits LUE Assessment LUE Assessment: Within Functional Limits RLE Assessment RLE Assessment: Within Functional Limits LLE Assessment LLE Assessment: Within Functional Limits   See Function Navigator for Current Functional Status.   Refer to Care Plan for Long Term Goals  Recommendations for other services: None  Discharge Criteria: Patient will be discharged from PT if patient refuses treatment 3 consecutive times without medical reason, if treatment goals not met, if there is a change in medical status, if patient makes no progress towards goals or if patient is discharged from hospital.  The above assessment, treatment plan, treatment alternatives and goals were discussed and mutually agreed upon: by patient  [facility] 12/30/2015, 3:55 PM

## 2015-12-30 NOTE — Progress Notes (Signed)
[facility] PHYSICAL MEDICINE & REHABILITATION     PROGRESS NOTE  Subjective/Complaints:  Pt seen sitting up in bed this AM.  He appears much more comfortably and notes minimal pain.  He slept well and also had a good BM.  ROS: Denies CP, SOB, n/v/d.  Objective: Vital Signs: Blood pressure 117/58, pulse 52, temperature 98.5 F (36.9 C), temperature source Oral, resp. rate 18, height 5\' 11"  (1.803 m), weight 79.017 kg (174 lb 3.2 oz), SpO2 99 %. No results found.  Recent Labs  12/28/15 0240  WBC 10.9*  HGB 10.9*  HCT 34.2*  PLT 459*    Recent Labs  12/28/15 0240  NA 138  K 4.3  CL 99*  GLUCOSE 118*  BUN 16  CREATININE 0.69  CALCIUM 8.9   CBG (last 3)  No results for input(s): GLUCAP in the last 72 hours.  Wt Readings from Last 3 Encounters:  12/29/15 79.017 kg (174 lb 3.2 oz)  12/20/15 80.604 kg (177 lb 11.2 oz)  12/15/15 81.4 kg (179 lb 7.3 oz)    Physical Exam:  BP 117/58 mmHg  Pulse 52  Temp(Src) 98.5 F (36.9 C) (Oral)  Resp 18  Ht 5\' 11"  (1.803 m)  Wt 79.017 kg (174 lb 3.2 oz)  BMI 24.31 kg/m2  SpO2 99% Constitutional: He appears well-developed and well-nourished. NAD.  Vital signs reviewed. HENT: Normocephalic and atraumatic.  Eyes: Conjunctivae and EOM are normal.  Cardiovascular: Normal rate and regular rhythm.  Respiratory: Effort normal and breath sounds normal. No respiratory distress.  GI: Soft. Bowel sounds are normal. He exhibits no distension.  Musculoskeletal: He exhibits mild edema and minimal tenderness.  Neurological: He is alert and oriented. Motor: B/l UE, LLE 5/5 RLE: hip flexion 4/5 (pain inhibition)  Skin: Skin is warm and dry.  AKA with staples c/d/i. Psychiatric: He has a normal mood and affect. His behavior is normal. Thought content normal.   Assessment/Plan: 1. Functional deficits secondary to right AKA after failed BKA which require 3+ hours per day of interdisciplinary therapy in a comprehensive inpatient rehab  setting. Physiatrist is providing close team supervision and 24 hour management of active medical problems listed below. Physiatrist and rehab team continue to assess barriers to discharge/monitor patient progress toward functional and medical goals.  Function:  Bathing Bathing position Bathing activity did not occur: Refused    Bathing parts      Bathing assist        Upper Body Dressing/Undressing Upper body dressing   What is the patient wearing?: Pull over shirt/dress     Pull over shirt/dress - Perfomed by patient: Thread/unthread right sleeve, Thread/unthread left sleeve, Put head through opening, Pull shirt over trunk          Upper body assist Assist Level: Set up   Set up : To obtain clothing/put away  Lower Body Dressing/Undressing Lower body dressing   What is the patient wearing?: Pants, Socks, Underwear Underwear - Performed by patient: Thread/unthread left underwear leg, Thread/unthread right underwear leg Underwear - Performed by helper: Pull underwear up/down Pants- Performed by patient: Thread/unthread right pants leg, Thread/unthread left pants leg, Fasten/unfasten pants Pants- Performed by helper: Pull pants up/down       Socks - Performed by helper: Don/doff left sock              Lower body assist Assist for lower body dressing: Touching or steadying assistance (Pt > 75%)      Toileting Toileting   Toileting steps  completed by patient: Performs perineal hygiene Toileting steps completed by helper: Adjust clothing prior to toileting, Adjust clothing after toileting    Toileting assist     Transfers Chair/bed transfer             Locomotion Ambulation           Wheelchair          Cognition Comprehension Comprehension assist level: Follows basic conversation/direction with extra time/assistive device  Expression Expression assist level: Expresses complex 90% of the time/cues < 10% of the time  Social Interaction Social  Interaction assist level: Interacts appropriately with others - No medications needed.  Problem Solving Problem solving assist level: Solves complex problems: With extra time  Memory Memory assist level: Recognizes or recalls 90% of the time/requires cueing < 10% of the time    Medical Problem List and Plan: 1. Right AKA 12/26/2015 after failed BKA secondary to progressive ischemic changes with peripheral vascular disease 2. DVT Prophylaxis/Anticoagulation: Subcutaneous Lovenox. Monitor platelet counts and any signs of bleeding 3. Pain Management: Celebrex 200 mg twice a day, fentanyl patch 50 g change every 72 hours, oxycodone 15 mg every 4 hours and Flexeril as needed 4. Acute blood loss anemia.   Follow-up CBC tomorrow 5. Neuropsych: This patient is capable of making decisions on his own behalf. 6. Skin/Wound Care: Routine skin checks 7. Fluids/Electrolytes/Nutrition: Routine I&O   Follow-up chemistries tomorrow 8. Hypertension. Lisinopril 20 mg daily, HCTZ 12.5 mg daily. Monitor with increased mobility  Stable at present 9. Hyperlipidemia. Lipitor 10. Diet-controlled diabetes mellitus. Hemoglobin A1c 5.7. Blood sugar checks discontinued averaging 93-105.  LOS (Days) 1 A FACE TO FACE EVALUATION WAS PERFORMED  Asiya Cutbirth Lorie Phenix 12/30/2015 2:12 PM

## 2015-12-30 NOTE — Evaluation (Signed)
Occupational Therapy Assessment and Plan  Patient Details  Name: Adam [name] MRN: [id number] Date of Birth: December 24, 1950  OT Diagnosis: acute pain and muscle weakness (generalized) Rehab Potential: Rehab Potential (ACUTE ONLY): Poor ELOS: 3 days   Today's Date: 12/30/2015 OT Individual Time: 1100-1200 OT Individual Time Calculation (min): 60 min     Problem List:  Patient Active Problem List   Diagnosis Date Noted  . Above knee amputation of right lower extremity ([place]) 12/29/2015  . Constipation due to pain medication   . Bleeding   . Hyponatremia   . Leukocytosis   . Transaminitis   . Chronic pain syndrome   . Pressure ulcer 12/20/2015  . Vascular insufficiency   . Abnormality of gait   . Status post below knee amputation of right lower extremity ([place])   . DM type 2 with diabetic peripheral neuropathy ([place])   . Essential hypertension   . SIRS (systemic inflammatory response syndrome) ([facility])   . Post-operative pain   . Acute blood loss anemia   . PAD (peripheral artery disease) ([place]) 12/15/2015  . Peripheral vascular disease, unspecified ([place]) 12/07/2015  . Hyperlipidemia associated with type 2 diabetes mellitus ([place]) 12/07/2015  . Great toe amputation status ([place]) 12/07/2015  . Atherosclerosis of native arteries of left leg with ulceration of unspecified site ([place]) 11/28/2015  . Atherosclerosis of native arteries of the extremities with ulceration ([place]) 11/26/2015  . Atherosclerosis of native arteries of the extremities with gangrene ([place]) 11/23/2015  . New onset type 2 diabetes mellitus ([place]) 11/22/2015  . Hypertension associated with diabetes ([place]) 12/04/2014    Past Medical History:  Past Medical History  Diagnosis Date  . Hypertension   . Peripheral vascular disease ([place])   . Gangrene ([place]) 11/22/2015    right great toe  . OSA on CPAP   . Type II diabetes mellitus ([place]) dx'd 11/2015  . PAD (peripheral artery disease) ([place])    Past Surgical History:  Past  Surgical History  Procedure Laterality Date  . Peripheral vascular catheterization N/A 11/26/2015    Procedure: Abdominal Aortogram;  Surgeon: [name], MD;  Location: [facility];  Service: Cardiovascular;  Laterality: N/A;  . Vasectomy    . Colonoscopy    . Femoral-popliteal bypass graft Right 11/28/2015    Procedure: RIGHT  FEMORAL-POPLITEAL ARTERY BYPASS GRAFT AND CONSTRUCTION OF MILLER CUFF USING 6 MM X 80 CM PROPATEN  GRAFT ;  Surgeon: Conrad Trowbridge, MD;  Location: [facility];  Service: Vascular;  Laterality: Right;  . Amputation Right 12/01/2015    Procedure: AMPUTATION RIGHT GREAT;  Surgeon: [employer], MD;  Location: [facility];  Service: Vascular;  Laterality: Right;  . Amputation Right 12/16/2015    Procedure: AMPUTATION BELOW KNEE;  Surgeon: [name], MD;  Location: [facility];  Service: Vascular;  Laterality: Right;  . Amputation Right 12/26/2015    Procedure: RIGHT ABOVE KNEE AMPUTATION;  Surgeon: [name], MD;  Location: [facility];  Service: Vascular;  Laterality: Right;  . Inguinal hernia repair Bilateral 2000    Assessment & Plan Clinical Impression: Patient is a 65 y.o. year old male right handed male with history of remote tobacco abuse, hypertension, diabetes mellitus and peripheral neuropathy, peripheral vascular disease status post right femoral-popliteal artery bypass 11/28/2015 and gangrenous right great toe status post right great toe amputation 12/01/2015 and chronic pain maintained on oxycodone 15 mg every 4 hours as needed. Patient lives with his wife. Independent prior to admission  working full time up until latest femoral bypass surgery. 2 level home with 2 steps to entry and bedroom upstairs. Wife can assist as needed. Presented 12/15/2015 with severe right foot pain and poor healing of recent right great toe amputation with profound ischemic changes. Limb was not felt to be salvageable and underwent right below-knee amputation 12/16/2015 per Dr. Donnetta Hutching. Hospital  course pain management with pharmacy consulted for pain control and wean from PCA placed on Oxycodone '30mg'$  every 4 hour,neurontin and fentanyl patch. Subcutaneous Lovenox for DVT prophylaxis. Physical and occupational therapy evaluations completed with recommendations of physical medicine rehabilitation consult. Patient was admitted for a comprehensive rehabilitation program 12/20/2015. Patient progressing nicely with therapies. Follow-up vascular surgery noting progressive ischemic changes nonhealing right BKA site felt AKA was needed. Patient was discharged to the services of vascular surgery 12/26/2015 and underwent right AKA for Dr. Donnetta Hutching. Hospital course ongoing pain management. Acute blood loss anemia 10.9 and monitored.  Patient transferred to CIR on 12/29/2015 .    Patient currently requires min with basic self-care skills and basic mobility secondary to muscle weakness and acute pina, decreased cardiorespiratoy endurance and decreased balance strategies.  Prior to hospitalization, patient could complete ADL with modified independent .  Patient will benefit from skilled intervention to decrease level of assist with basic self-care skills and increase independence with basic self-care skills prior to discharge home with care partner.  Anticipate patient will require a with IADLs and follow up home health.  OT - End of Session Activity Tolerance: Improving Endurance Deficit: Yes OT Assessment Rehab Potential (ACUTE ONLY): Poor OT Patient demonstrates impairments in the following area(s): Balance;Edema;Endurance;Motor;Nutrition;Skin Integrity OT Basic ADL's Functional Problem(s): Grooming;Bathing;Dressing;Toileting OT Transfers Functional Problem(s): Toilet;Tub/Shower OT Additional Impairment(s): None OT Plan OT Intensity: Minimum of 1-2 x/day, 45 to 90 minutes OT Frequency: 5 out of 7 days OT Duration/Estimated Length of Stay: 3 days OT Treatment/Interventions: Balance/vestibular  training;Community reintegration;Discharge planning;Functional mobility training;Pain management;Patient/family education;Self Care/advanced ADL retraining;Skin care/wound managment;Therapeutic Activities;Therapeutic Exercise;Wheelchair propulsion/positioning;Disease mangement/prevention;DME/adaptive equipment instruction;Psychosocial support;UE/LE Strength taining/ROM OT Self Feeding Anticipated Outcome(s): I  OT Basic Self-Care Anticipated Outcome(s): mod I  OT Toileting Anticipated Outcome(s): mod I  OT Bathroom Transfers Anticipated Outcome(s): mod I  OT Recommendation Recommendations for Other Services: Neuropsych consult Patient destination: Home Follow Up Recommendations: None Equipment Recommended: To be determined   Skilled Therapeutic Intervention OT eval initiated with OT purpose, goals, and role. Self care retraining including transfer training. Pt declined bathing today reporting he bathed yesterday.  Dressed sitting EOB with education on care for residual limb (including desensitization techniques and wearing and care of shrinker) with pt and pt's wife. Transferred to w/c squat pivot with close supervision to steadying A. Transitioned down to gym - pt able to propel entire way.  Performed 6 min on NuStep with resistance on level 7 for cardiopulmonary endurance. Transitioned to mat for laying in supine and in prone for prolonged stretch to hip flexors. Pt tight requiring extra time for LE to come into full hip extension.  Transitioned to ADL apartment and perform stand pivot transfers to elevated bed with RW, short distance functional ambulation with RW and hopping over simulated thresholds (simular to getting in and out of shower). Pt able to perform with steadying A with extra time for problem solving. Pt still requires min questioning cues for proper hand placement for sit <> stands.  Return to room and ambulated from sink to recliner with RW with close supervision.    OT  Evaluation Precautions/Restrictions  Precautions Precautions: Fall Precaution Comments: R AKA Restrictions Weight Bearing Restrictions: Yes RLE Weight Bearing: Non weight bearing General Chart Reviewed: Yes Family/Caregiver Present: Yes (wife) Vital Signs  Pain Pain Assessment Pain Assessment: 0-10 Pain Score: 5  Pain Type: Acute pain;Surgical pain Pain Location: Leg Pain Orientation: Right Pain Descriptors / Indicators: Aching;Shooting Pain Frequency: Constant Pain Onset: On-going Pain Intervention(s): Medication (See eMAR) Home Living/Prior [place] expects to be discharged to:: Private residence Living Arrangements: Spouse/significant other Available Help at Discharge: Family, Available 24 hours/day Type of Home: House Home Access: Stairs to enter [employer] of Steps: 4 from garage Entrance Stairs-Rails: None Home Layout: Two level, Bed/bath upstairs, 1/2 bath on main level Alternate Level Stairs-Number of Steps: 15 then landing then 6 more Alternate Level Stairs-Rails: Left Bathroom Shower/Tub: Multimedia programmer: Standard  Lives With: Spouse ADL   Vision/Perception  Vision- History Baseline Vision/History: Wears glasses Wears Glasses: Distance only Patient Visual Report: No change from baseline Vision- Assessment Vision Assessment?: No apparent visual deficits  Cognition Overall Cognitive Status: Within Functional Limits for tasks assessed Arousal/Alertness: Awake/alert Orientation Level: Person;Place;Situation Person: Oriented Place: Oriented Situation: Oriented Year: 2017 Month: February Day of Week: Correct Memory: Appears intact Immediate Memory Recall: Sock;Blue;Bed Memory Recall: Sock;Blue;Bed Memory Recall Sock: Without Cue Memory Recall Blue: Without Cue Memory Recall Bed: Without Cue Selective Attention: Appears intact Awareness: Appears intact Problem Solving: Appears  intact Safety/Judgment: Appears intact Sensation Sensation Light Touch: Appears Intact Stereognosis: Appears Intact Hot/Cold: Appears Intact Proprioception: Appears Intact Coordination Gross Motor Movements are Fluid and Coordinated: Yes Fine Motor Movements are Fluid and Coordinated: Yes Motor  Motor Motor: Within Functional Limits Motor - Skilled Clinical Observations: generalized weakness Mobility  Transfers Transfers: Sit to Stand;Stand to Sit Sit to Stand: 4: Min guard Stand to Sit: 4: Min guard  Trunk/Postural Assessment  Cervical Assessment Cervical Assessment: Within Functional Limits Thoracic Assessment Thoracic Assessment: Within Functional Limits Lumbar Assessment Lumbar Assessment: Within Functional Limits Postural Control Postural Control: Within Functional Limits  Balance Dynamic Sitting Balance Dynamic Sitting - Balance Support: Left upper extremity supported;Right upper extremity supported;During functional activity Dynamic Sitting - Level of Assistance: 4: Min assist Sitting balance - Comments: LB dressing Static Standing Balance Static Standing - Balance Support: Bilateral upper extremity supported Static Standing - Level of Assistance: 5: Stand by assistance Dynamic Standing Balance Dynamic Standing - Level of Assistance: 4: Min assist Extremity/Trunk Assessment RUE Assessment RUE Assessment: Within Functional Limits LUE Assessment LUE Assessment: Within Functional Limits   See Function Navigator for Current Functional Status.   Refer to Care Plan for Long Term Goals  Recommendations for other services: None  Discharge Criteria: Patient will be discharged from OT if patient refuses treatment 3 consecutive times without medical reason, if treatment goals not met, if there is a change in medical status, if patient makes no progress towards goals or if patient is discharged from hospital.  The above assessment, treatment plan, treatment  alternatives and goals were discussed and mutually agreed upon: by patient and by family  Nicoletta Ba 12/30/2015, 12:54 PM

## 2015-12-30 NOTE — H&P (View-Only) (Signed)
  Physical Medicine and Rehabilitation Admission H&P    Chief complaint: Stump pain  HPI: Adam [name] is a 65 y.o. right handed male with history of remote tobacco abuse, hypertension, diabetes mellitus and peripheral neuropathy, peripheral vascular disease status post right femoral-popliteal artery bypass 11/28/2015 and gangrenous right great toe status post right great toe amputation 12/01/2015 and chronic pain maintained on oxycodone 15 mg every 4 hours as needed. Patient lives with his wife. Independent prior to admission working full time up until latest femoral bypass surgery. 2 level home with 2 steps to entry and bedroom upstairs. Wife can assist as needed. Presented 12/15/2015 with severe right foot pain and poor healing of recent right great toe amputation with profound ischemic changes. Limb was not felt to be salvageable and underwent right below-knee amputation 12/16/2015 per Dr. Early. Hospital course pain management with pharmacy consulted for pain control and wean from PCA placed on Oxycodone 30mg every 4 hour,neurontin and fentanyl patch. Subcutaneous Lovenox for DVT prophylaxis. Physical and occupational therapy evaluations completed with recommendations of physical medicine rehabilitation consult. Patient was admitted for a comprehensive rehabilitation program 12/20/2015. Patient progressing nicely with therapies. Follow-up vascular surgery noting progressive ischemic changes nonhealing right BKA site felt AKA was needed. Patient was discharged to the services of vascular surgery 12/26/2015 and underwent right AKA for Dr. Early. Hospital course ongoing pain management. Acute blood loss anemia 10.9 and monitored. Physical and occupational therapy evaluations completed postoperatively with recommendations of physical medicine rehabilitation consult. Patient was admitted for comprehensive rehabilitation program  ROS ROS Constitutional: Negative for fever and chills.  HENT: Negative for  hearing loss.  Eyes: Negative for blurred vision and double vision.  Respiratory: Negative for cough and shortness of breath.  Cardiovascular: Positive for leg swelling. Negative for chest pain and palpitations.  Gastrointestinal: Positive for constipation. Negative for nausea and vomiting.  Genitourinary: Negative for dysuria and hematuria.  Musculoskeletal: Positive for myalgias and joint pain.   Right stump pain (improving) Skin: Negative for rash.  Neurological: Negative for seizures, loss of consciousness and headaches.  All other systems reviewed and are negative   Past Medical History  Diagnosis Date  . Hypertension   . Peripheral vascular disease (HCC)   . Gangrene (HCC) 11/22/2015    right great toe  . OSA on CPAP   . Type II diabetes mellitus (HCC) dx'd 11/2015  . PAD (peripheral artery disease) (HCC)    Past Surgical History  Procedure Laterality Date  . Peripheral vascular catheterization N/A 11/26/2015    Procedure: Abdominal Aortogram;  Surgeon: Christopher S Dickson, MD;  Location: [facility];  Service: Cardiovascular;  Laterality: N/A;  . Vasectomy    . Colonoscopy    . Femoral-popliteal bypass graft Right 11/28/2015    Procedure: RIGHT  FEMORAL-POPLITEAL ARTERY BYPASS GRAFT AND CONSTRUCTION OF MILLER CUFF USING 6 MM X 80 CM PROPATEN  GRAFT ;  Surgeon: Brian L Chen, MD;  Location: [facility] OR;  Service: Vascular;  Laterality: Right;  . Amputation Right 12/01/2015    Procedure: AMPUTATION RIGHT GREAT;  Surgeon: Brian L Chen, MD;  Location: [facility] OR;  Service: Vascular;  Laterality: Right;  . Amputation Right 12/16/2015    Procedure: AMPUTATION BELOW KNEE;  Surgeon: Todd F Early, MD;  Location: [facility] OR;  Service: Vascular;  Laterality: Right;  . Amputation Right 12/26/2015    Procedure: RIGHT ABOVE KNEE AMPUTATION;  Surgeon: Todd F Early, MD;  Location: [facility] OR;  Service: Vascular;  Laterality: Right;  .   Inguinal hernia repair Bilateral 2000   Family History    Problem Relation Age of Onset  . Hypertension Mother    Social History:  reports that he quit smoking about 4 years ago. His smoking use included Cigarettes. He has a 30 pack-year smoking history. He has never used smokeless tobacco. He reports that he drinks about 4.2 oz of alcohol per week. He reports that he does not use illicit drugs. Allergies: No Known Allergies Medications Prior to Admission  Medication Sig Dispense Refill  . aspirin 81 MG tablet Take 81 mg by mouth daily.    Marland Kitchen atorvastatin (LIPITOR) 10 MG tablet Take 1 tablet (10 mg total) by mouth daily. (Patient taking differently: Take 10 mg by mouth every other day. ) 90 tablet 3  . docusate sodium (COLACE) 100 MG capsule Take 100 mg by mouth 2 (two) times daily as needed for mild constipation.    Marland Kitchen lisinopril-hydrochlorothiazide (PRINZIDE,ZESTORETIC) 20-12.5 MG per tablet Take 1 tablet by mouth daily. 90 tablet 3  . Multiple Vitamin (MULTIVITAMIN) tablet Take 1 tablet by mouth daily.    Marland Kitchen oxyCODONE 10 MG TABS Take 1 tablet (10 mg total) by mouth every 4 (four) hours as needed for moderate pain. (Patient taking differently: Take 15 mg by mouth every 4 (four) hours as needed for moderate pain. ) 50 tablet 0  . oxyCODONE-acetaminophen (PERCOCET) 10-325 MG tablet Take 1 tablet by mouth every 4 (four) hours as needed for pain. 30 tablet 0  . oxyCODONE-acetaminophen (PERCOCET/ROXICET) 5-325 MG tablet Take 1-2 tablets by mouth every 6 (six) hours as needed for moderate pain. 30 tablet 0  . traMADol (ULTRAM) 50 MG tablet Take 1 tablet (50 mg total) by mouth every 8 (eight) hours as needed. (Patient not taking: Reported on 12/15/2015) 30 tablet 0    Home: [place] expects to be discharged to:: Private residence Living Arrangements: Spouse/significant other Available Help at Discharge: Family, Available 24 hours/day Type of Home: House Home Access: Stairs to enter [employer] of Steps: 4 from garage Entrance  Stairs-Rails: None Home Layout: Two level, Bed/bath upstairs, 1/2 bath on main level Alternate Level Stairs-Number of Steps: 15 then landing then 6 more Alternate Level Stairs-Rails: Left Bathroom Shower/Tub: Multimedia programmer: Standard Home Equipment: Environmental consultant - 2 wheels, Crutches, Shower seat - built in Additional Comments: Pt has 2 steps in garage w/o rails, has 3 steps in front of the house w/ Lt and Rt rails, and has 1 flight to get upstairs to his bedroom w/ Lt rail  Lives With: Spouse   Functional History: Prior Function Level of Independence: Independent with assistive device(s)  Functional Status:  Mobility: Bed Mobility Overal bed mobility: Needs Assistance Bed Mobility: Supine to Sit Supine to sit: Min guard, HOB elevated General bed mobility comments: Increased time, good use of UEs to scoot to EOB. No dizziness. Transfers Overall transfer level: Needs assistance Equipment used: Rolling walker (2 wheeled) Transfers: Sit to/from Stand Sit to Stand: Min assist, +2 safety/equipment Stand pivot transfers: Min assist, +2 safety/equipment General transfer comment: cues for hand placement, min assist to steady. SPT bed to chair with Min A for balance. Not able to extend right residual limb to neutral despite cues. Pain limiting.      ADL: ADL Overall ADL's : Needs assistance/impaired Eating/Feeding: Set up, Sitting Grooming: Set up, Sitting Upper Body Bathing: Set up, Sitting Lower Body Bathing: Sit to/from stand, Maximal assistance Upper Body Dressing : Set up, Sitting Lower Body Dressing:  Maximal assistance, Sit to/from stand Toilet Transfer: Minimal assistance, +2 for safety/equipment, Stand-pivot, RW Toilet Transfer Details (indicate cue type and reason): simulated bed to chair General ADL Comments: Pt fatigued, reporting having not slept well last night, but still agreeable to OOB to chair.  Cognition: Cognition Overall Cognitive Status: Within  Functional Limits for tasks assessed Orientation Level: Oriented X4 Cognition Arousal/Alertness: Awake/alert Behavior During Therapy: WFL for tasks assessed/performed Overall Cognitive Status: Within Functional Limits for tasks assessed  Physical Exam: Blood pressure 128/63, pulse 56, temperature 98.1 F (36.7 C), temperature source Oral, resp. rate 18, SpO2 99 %. Physical Exam  Vitals reviewed. Constitutional: He is oriented to person, place, and time. He appears well-developed and well-nourished.  HENT:  Head: Normocephalic and atraumatic.  Eyes: Conjunctivae and EOM are normal.  Neck: Normal range of motion. Neck supple. No thyromegaly present.  Cardiovascular: Normal rate and regular rhythm.   Respiratory: Effort normal and breath sounds normal. No respiratory distress.  GI: Soft. Bowel sounds are normal. He exhibits no distension.  Musculoskeletal: He exhibits edema and tenderness.  Neurological: He is alert and oriented to person, place, and time.  Motor: B/l UE, LLE 5/5 RLE: hip flexion 3/5 (pain inhibition)  Skin: Skin is warm and dry.  Dressing in place to AKA appropriately tender without drainage  Psychiatric: He has a normal mood and affect. His behavior is normal. Thought content normal.    Results for orders placed or performed during the hospital encounter of 12/26/15 (from the past 48 hour(s))  Glucose, capillary     Status: None   Collection Time: 12/26/15  3:35 PM  Result Value Ref Range   Glucose-Capillary 96 65 - 99 mg/dL  CBC     Status: Abnormal   Collection Time: 12/26/15  6:52 PM  Result Value Ref Range   WBC 10.3 4.0 - 10.5 K/uL   RBC 3.69 (L) 4.22 - 5.81 MIL/uL   Hemoglobin 11.1 (L) 13.0 - 17.0 g/dL   HCT 33.4 (L) 39.0 - 52.0 %   MCV 90.5 78.0 - 100.0 fL   MCH 30.1 26.0 - 34.0 pg   MCHC 33.2 30.0 - 36.0 g/dL   RDW 12.7 11.5 - 15.5 %   Platelets 411 (H) 150 - 400 K/uL  Creatinine, serum     Status: None   Collection Time: 12/26/15  6:52 PM    Result Value Ref Range   Creatinine, Ser 0.70 0.61 - 1.24 mg/dL   GFR calc non Af Amer >60 >60 mL/min   GFR calc Af Amer >60 >60 mL/min    Comment: (NOTE) The eGFR has been calculated using the CKD EPI equation. This calculation has not been validated in all clinical situations. eGFR's persistently <60 mL/min signify possible Chronic Kidney Disease.   Glucose, capillary     Status: None   Collection Time: 12/26/15  7:53 PM  Result Value Ref Range   Glucose-Capillary 94 65 - 99 mg/dL  Basic metabolic panel     Status: None   Collection Time: 12/27/15  5:18 AM  Result Value Ref Range   Sodium 137 135 - 145 mmol/L   Potassium 4.9 3.5 - 5.1 mmol/L   Chloride 101 101 - 111 mmol/L   CO2 27 22 - 32 mmol/L   Glucose, Bld 95 65 - 99 mg/dL   BUN 12 6 - 20 mg/dL   Creatinine, Ser 0.65 0.61 - 1.24 mg/dL   Calcium 8.9 8.9 - 10.3 mg/dL   GFR calc non Af  Amer >60 >60 mL/min   GFR calc Af Amer >60 >60 mL/min    Comment: (NOTE) The eGFR has been calculated using the CKD EPI equation. This calculation has not been validated in all clinical situations. eGFR's persistently <60 mL/min signify possible Chronic Kidney Disease.    Anion gap 9 5 - 15  CBC     Status: Abnormal   Collection Time: 12/27/15  5:18 AM  Result Value Ref Range   WBC 12.4 (H) 4.0 - 10.5 K/uL   RBC 3.75 (L) 4.22 - 5.81 MIL/uL   Hemoglobin 11.5 (L) 13.0 - 17.0 g/dL   HCT 34.0 (L) 39.0 - 52.0 %   MCV 90.7 78.0 - 100.0 fL   MCH 30.7 26.0 - 34.0 pg   MCHC 33.8 30.0 - 36.0 g/dL   RDW 12.5 11.5 - 15.5 %   Platelets 479 (H) 150 - 400 K/uL  Glucose, capillary     Status: None   Collection Time: 12/27/15  6:16 AM  Result Value Ref Range   Glucose-Capillary 93 65 - 99 mg/dL  Basic metabolic panel     Status: Abnormal   Collection Time: 12/28/15  2:40 AM  Result Value Ref Range   Sodium 138 135 - 145 mmol/L   Potassium 4.3 3.5 - 5.1 mmol/L   Chloride 99 (L) 101 - 111 mmol/L   CO2 29 22 - 32 mmol/L   Glucose, Bld 118 (H)  65 - 99 mg/dL   BUN 16 6 - 20 mg/dL   Creatinine, Ser 0.69 0.61 - 1.24 mg/dL   Calcium 8.9 8.9 - 10.3 mg/dL   GFR calc non Af Amer >60 >60 mL/min   GFR calc Af Amer >60 >60 mL/min    Comment: (NOTE) The eGFR has been calculated using the CKD EPI equation. This calculation has not been validated in all clinical situations. eGFR's persistently <60 mL/min signify possible Chronic Kidney Disease.    Anion gap 10 5 - 15  CBC     Status: Abnormal   Collection Time: 12/28/15  2:40 AM  Result Value Ref Range   WBC 10.9 (H) 4.0 - 10.5 K/uL   RBC 3.75 (L) 4.22 - 5.81 MIL/uL   Hemoglobin 10.9 (L) 13.0 - 17.0 g/dL   HCT 34.2 (L) 39.0 - 52.0 %   MCV 91.2 78.0 - 100.0 fL   MCH 29.1 26.0 - 34.0 pg   MCHC 31.9 30.0 - 36.0 g/dL   RDW 12.8 11.5 - 15.5 %   Platelets 459 (H) 150 - 400 K/uL   Dg Chest Port 1 View  12/26/2015  CLINICAL DATA:  Preoperative examination prior foot surgery, no current chest complaints, former smoker. EXAM: PORTABLE CHEST 1 VIEW COMPARISON:  None in PACs FINDINGS: The lungs are adequately inflated and clear. The heart and pulmonary vascularity are normal. The mediastinum is normal in width. The bony thorax is unremarkable. IMPRESSION: There is no active cardiopulmonary disease. Electronically Signed   By: David  Jordan M.D.   On: 12/26/2015 07:49    Medical Problem List and Plan: 1.  Right AKA 12/26/2015 after failed BKA secondary to progressive ischemic changes with peripheral vascular disease 2.  DVT Prophylaxis/Anticoagulation: Subcutaneous Lovenox. Monitor platelet counts and any signs of bleeding 3. Pain Management: Celebrex 200 mg twice a day, fentanyl patch 50 g change every 72 hours, oxycodone 15 mg every 4 hours and Flexeril as needed 4. Acute blood loss anemia. Follow-up CBC 5. Neuropsych: This patient is capable of making decisions on   his own behalf. 6. Skin/Wound Care: Routine skin checks 7. Fluids/Electrolytes/Nutrition: Routine I&O with follow-up  chemistries 8. Hypertension. Lisinopril 20 mg daily, HCTZ 12.5 mg daily. Monitor with increased mobility 9. Hyperlipidemia. Lipitor 10. Diet-controlled diabetes mellitus. Hemoglobin A1c 5.7. Blood sugar checks discontinued averaging 93-105  Post Admission Physician Evaluation: 1. Functional deficits secondary  to right AKA after failed BKA. 2. Patient is admitted to receive collaborative, interdisciplinary care between the physiatrist, rehab nursing staff, and therapy team. 3. Patient's level of medical complexity and substantial therapy needs in context of that medical necessity cannot be provided at a lesser intensity of care such as a SNF. 4. Patient has experienced substantial functional loss from his/her baseline which was documented above under the "Functional History" and "Functional Status" headings.  Judging by the patient's diagnosis, physical exam, and functional history, the patient has potential for functional progress which will result in measurable gains while on inpatient rehab.  These gains will be of substantial and practical use upon discharge  in facilitating mobility and self-care at the household level. 5. Physiatrist will provide 24 hour management of medical needs as well as oversight of the therapy plan/treatment and provide guidance as appropriate regarding the interaction of the two. 6. 24 hour rehab nursing will assist with safety, skin/wound care, disease management, pain management and patient education and help integrate therapy concepts, techniques,education, etc. 7. PT will assess and treat for/with: Lower extremity strength, range of motion, stamina, balance, functional mobility, safety, adaptive techniques and equipment, woundcare, coping skills, pain control, education.   Goals are: Mod I. 8. OT will assess and treat for/with: ADL's, functional mobility, safety, upper extremity strength, adaptive techniques and equipment, wound mgt, ego support, and community  reintegration.   Goals are: Mod I. Therapy may not proceed with showering this patient. 9. Case Management and Social Worker will assess and treat for psychological issues and discharge planning. 10. Team conference will be held weekly to assess progress toward goals and to determine barriers to discharge. 11. Patient will receive at least 3 hours of therapy per day at least 5 days per week. 12. ELOS: 5-7 days.       13. Prognosis:  excellent  Delice Lesch, MD 12/28/2015

## 2015-12-30 NOTE — Interval H&P Note (Signed)
Adam [name] was admitted yesterday to Inpatient Rehabilitation with the diagnosis of right AKA after failed BKA.  The patient's history has been reviewed, patient examined, and there is no change in status.  Patient continues to be appropriate for intensive inpatient rehabilitation.  I have reviewed the patient's chart and labs.  Questions were answered to the patient's satisfaction. The PAPE has been reviewed and assessment remains appropriate.  Adam [name] 12/30/2015, 2:19 PM

## 2015-12-31 ENCOUNTER — Inpatient Hospital Stay (HOSPITAL_COMMUNITY): Payer: No Typology Code available for payment source

## 2015-12-31 ENCOUNTER — Inpatient Hospital Stay (HOSPITAL_COMMUNITY): Payer: No Typology Code available for payment source | Admitting: Occupational Therapy

## 2015-12-31 ENCOUNTER — Inpatient Hospital Stay (HOSPITAL_COMMUNITY): Payer: Medicare Other | Admitting: Physical Therapy

## 2015-12-31 LAB — COMPREHENSIVE METABOLIC PANEL
ALT: 100 U/L — ABNORMAL HIGH (ref 17–63)
ANION GAP: 13 (ref 5–15)
AST: 44 U/L — ABNORMAL HIGH (ref 15–41)
Albumin: 2.5 g/dL — ABNORMAL LOW (ref 3.5–5.0)
Alkaline Phosphatase: 63 U/L (ref 38–126)
BILIRUBIN TOTAL: 0.5 mg/dL (ref 0.3–1.2)
BUN: 16 mg/dL (ref 6–20)
CO2: 25 mmol/L (ref 22–32)
Calcium: 9.2 mg/dL (ref 8.9–10.3)
Chloride: 102 mmol/L (ref 101–111)
Creatinine, Ser: 0.64 mg/dL (ref 0.61–1.24)
Glucose, Bld: 102 mg/dL — ABNORMAL HIGH (ref 65–99)
POTASSIUM: 4.2 mmol/L (ref 3.5–5.1)
Sodium: 140 mmol/L (ref 135–145)
TOTAL PROTEIN: 6 g/dL — AB (ref 6.5–8.1)

## 2015-12-31 LAB — CBC WITH DIFFERENTIAL/PLATELET
BASOS PCT: 1 %
Basophils Absolute: 0 10*3/uL (ref 0.0–0.1)
EOS ABS: 0.5 10*3/uL (ref 0.0–0.7)
EOS PCT: 6 %
HEMATOCRIT: 33.7 % — AB (ref 39.0–52.0)
Hemoglobin: 10.9 g/dL — ABNORMAL LOW (ref 13.0–17.0)
LYMPHS ABS: 1.9 10*3/uL (ref 0.7–4.0)
Lymphocytes Relative: 22 %
MCH: 29.9 pg (ref 26.0–34.0)
MCHC: 32.3 g/dL (ref 30.0–36.0)
MCV: 92.3 fL (ref 78.0–100.0)
Monocytes Absolute: 0.7 10*3/uL (ref 0.1–1.0)
Monocytes Relative: 8 %
Neutro Abs: 5.6 10*3/uL (ref 1.7–7.7)
Neutrophils Relative %: 63 %
Platelets: 522 10*3/uL — ABNORMAL HIGH (ref 150–400)
RBC: 3.65 MIL/uL — ABNORMAL LOW (ref 4.22–5.81)
RDW: 12.9 % (ref 11.5–15.5)
WBC: 8.7 10*3/uL (ref 4.0–10.5)

## 2015-12-31 MED ORDER — FENTANYL 25 MCG/HR TD PT72
25.0000 ug | MEDICATED_PATCH | TRANSDERMAL | Status: DC
Start: 2016-01-03 — End: 2016-01-01

## 2015-12-31 MED ORDER — OXYCODONE HCL 5 MG PO TABS
5.0000 mg | ORAL_TABLET | ORAL | Status: DC | PRN
  Administered 2015-12-31 – 2016-01-01 (×4): 5 mg via ORAL
  Filled 2015-12-31 (×5): qty 1

## 2015-12-31 NOTE — Progress Notes (Signed)
Occupational Therapy Session Note  Patient Details  Name: Adam [name] MRN: [id number] Date of Birth: 06-12-1951  Today's Date: 12/31/2015  Short Term Goals: Week 1:  OT Short Term Goal 1 (Week 1): STG's = LTG's secondary to short ELOS  Skilled Therapeutic Interventions/Progress Updates:   Session #1 Individual Therapy 0900-1000 - 60 Minutes Pain Assessment: No complaints of pain  Pt received supine in bed. Pt willing and eager to work with therapist. Pt engaged in bed mobility at mod I level, donned left sock, stood with RW, and ambulated to sink side where w/c was placed. Educated pt on importance of ALWAYS making sure w/c was locked prior to sitting down or standing up. Pt performed grooming tasks seated at sink, pt was able to gather necessary items for this. Pt then performed UB bathing & dressing at mod I level at sink. Pt donned pants with steady assist from therapist, standing at sink and using sink to steady himself; encouraged pt to hold on with one hand and pull up with other hand. Pt then ambulated into BR using RW and stood for urination with steady assist from therapist. Martin Majestic over blue theraband HEP and provided min cueing for correct technique and mechanics for UB exercises using theraband.   Discussed home set-up with patient. Encouraged patient to plan to stay on first level, as patient has multiple steps to get to his upstairs bed/bathroom. Downstairs encouraged pt to install grab bar beside toilet and use stool OR BSC in front of sink for ADL tasks at this time. Once patient able to go upstairs for shower, recommend pt use BSC in shower and have grab bars installed for his safety. Plan to discuss this with patient's wife during afternoon session.  At end of session, pt left seated in w/c. Pt propelled self to dayroom and was excited to get out of the room and "roll around" on his own. Therapist made sure patient knew to be back in his room by 11 am for next PT session.    Session #2 Individual Therapy 1330-1430 - 60 Minutes Pain Assessment: No complaints of pain  Pt found supine on therapy mat in therapy gym with wife present. Educated patient and wife on importance of supervision during all mobility at this time, staying on first level in house to begin with (for the first few nights at least), use of BSC in shower, use of BSC over toilet seats prn & for ADLs prn, putting up grab bars around toilet in half bath & in shower stall, relaxing RLE in standing positions. Pt engaged in bed mobility, sat edge of mat, then stood with RW. Pt practiced functional side stepping ambulation, simulated like he will have to do it at home to get in/out of BR; wife present for this. Pt then propelled to ADL apartment and practiced simulated walk-in shower transfer, educating patient and wife on correct and safest technique. From here, patient's wife left and therapist had patient engage in UB strengthening exercises using paragym. Pt propelled self towards room and at end of session, left pt seated in w/c with RN present.  Therapy Documentation Precautions:  Precautions Precautions: Fall Precaution Comments: R AKA Restrictions Weight Bearing Restrictions: Yes RLE Weight Bearing: Non weight bearing  Vital Signs: Therapy Vitals BP: 133/71 mmHg   See Function Navigator for Current Functional Status.  Chrys Racer , MS, OTR/L, CLT Pager: [phone]  12/31/2015, 10:51 AM

## 2015-12-31 NOTE — Progress Notes (Addendum)
Social Work  This note contains a copy of the recent psychosocial assessment completed prior to 3 day return to acute for further surgery.  None of the information has changed except for new diagnosis of AKA.  Pt remains in good spirits and d/c plan continues to be home with family able to provide any assistance needed.          Patient ID: Adam [name], male   DOB: [birth year], 65 y.o.   MRN: [id number]   Patient Information    Patient Name Sex DOB SSN   Adam, [name] Male February 24, 1951 [id number]    Progress Notes by Lowella Curb, LCSW at 12/21/2015 3:03 PM    Author: Lowella Curb, LCSW Service: (none) Author Type: Social Worker   Filed: 12/21/2015 3:05 PM Note Time: 12/21/2015 3:03 PM Status: Signed   Editor: Lowella Curb, LCSW (Social Worker)     Expand All Collapse All   Social Work Social Work Assessment and Plan  Patient Details  Name: Adam [name] MRN: [id number] Date of Birth: 06-17-1951  Today's Date: 12/21/2015  Problem List:  Patient Active Problem List   Diagnosis Date Noted  . Hyponatremia   . Leukocytosis   . Transaminitis   . Chronic pain syndrome   . Pressure ulcer 12/20/2015  . Vascular insufficiency   . Abnormality of gait   . Status post below knee amputation of right lower extremity ([place])   . DM type 2 with diabetic peripheral neuropathy ([place])   . Essential hypertension   . SIRS (systemic inflammatory response syndrome) (HCC)   . Post-operative pain   . Acute blood loss anemia   . PAD (peripheral artery disease) ([place]) 12/15/2015  . Peripheral vascular disease, unspecified ([place]) 12/07/2015  . Hyperlipidemia associated with type 2 diabetes mellitus ([place]) 12/07/2015  . Great toe amputation status ([place]) 12/07/2015  . Atherosclerosis of native arteries of left leg with ulceration of unspecified site ([place]) 11/28/2015  . Atherosclerosis of native arteries of the extremities with  ulceration ([place]) 11/26/2015  . Atherosclerosis of native arteries of the extremities with gangrene ([place]) 11/23/2015  . New onset type 2 diabetes mellitus ([place]) 11/22/2015  . Hypertension associated with diabetes ([place]) 12/04/2014   Past Medical History:  Past Medical History  Diagnosis Date  . Diabetes mellitus without complication ([place])   . Hypertension   . Peripheral vascular disease ([place])   . Gangrene ([place]) 11/22/2015    right great toe  . Sleep apnea     uses cpap   Past Surgical History:  Past Surgical History  Procedure Laterality Date  . Peripheral vascular catheterization N/A 11/26/2015    Procedure: Abdominal Aortogram; Surgeon: [name], MD; Location: [facility]; Service: Cardiovascular; Laterality: N/A;  . Hernia repair Bilateral 2000    inguinal  . Vasectomy    . Colonoscopy    . Femoral-popliteal bypass graft Right 11/28/2015    Procedure: RIGHT FEMORAL-POPLITEAL ARTERY BYPASS GRAFT AND CONSTRUCTION OF MILLER CUFF USING 6 MM X 80 CM PROPATEN GRAFT ; Surgeon: Conrad Wheaton, MD; Location: [facility]; Service: Vascular; Laterality: Right;  . Amputation Right 12/01/2015    Procedure: AMPUTATION RIGHT GREAT; Surgeon: Conrad Narberth, MD; Location: [facility]; Service: Vascular; Laterality: Right;  . Amputation Right 12/16/2015    Procedure: AMPUTATION BELOW KNEE; Surgeon: [name], MD; Location: [facility]; Service: Vascular; Laterality: Right;   Social History:  reports that he quit smoking about 4 years ago. He has never  used smokeless tobacco. He reports that he drinks about 4.2 oz of alcohol per week. He reports that he does not use illicit drugs.  Family / Support Systems Marital Status: Married Patient Roles: Spouse, Parent Spouse/Significant Other: wife, [name] @ [phone] Children: adult daughters, [name], [name] Anticipated  Caregiver: wife and self Ability/Limitations of Caregiver: Wife works PT, has a flexible schedule Caregiver Availability: 24/7 (in needed) Family Dynamics: Pt describes very supportive family. Denies any concerns about assist available at d/c.  Social History Preferred language: English Religion: Baptist Cultural Background: NA Education: college Read: Yes Write: Yes Employment Status: Employed Name of Employer: [employer] Return to Work Plans: "Absolutely." Pt fully intends to return as soon as medically cleared to do so. Legal Hisotry/Current Legal Issues: None Guardian/Conservator: None - per MD, pt capable of making decisions on his own behalf.   Abuse/Neglect Physical Abuse: Denies Verbal Abuse: Denies Sexual Abuse: Denies Exploitation of patient/patient's resources: Denies Self-Neglect: Denies  Emotional Status Pt's affect, behavior adn adjustment status: Pt able to complete the interview without difficulty. Offers very direct answers and admits that he is experiencing pain throughout the time. He quickly denies any emotional distress related to BKA. "It is what it is...just looking forward to getting my (prosthetic) leg and moving on." Will monitor and refer for neuropsych input as indicated. Recent Psychosocial Issues: recent prior surgeries made in attempts to avoid BKA Pyschiatric History: None Substance Abuse History: None  Patient / Family Perceptions, Expectations & Goals Pt/Family understanding of illness & functional limitations: Pt with very good understanding of the medical course that took place leading to need for BKA. Good understanding of his current functional limitations/ need for CIR. Premorbid pt/family roles/activities: Reports he had recovered well from prior to surgeries and was independent in his home (including up/down flight of steps). Anticipated changes in roles/activities/participation: Little change anticipated as pt with  mod i goals. Wife to assist as needed. Pt/family expectations/goals: Pt reports his primary hope at this time is to "get this pain under control." He has no concerns about regaining an independent and active lifestyle.  Community Resources Express Scripts: None Premorbid Home Care/DME Agencies: None Transportation available at discharge: yes Resource referrals recommended: Support group (specify)  Discharge Planning Living Arrangements: Spouse/significant other Support Systems: Spouse/significant other, Children, Other relatives, Friends/neighbors Type of Residence: Private residence Insurance Resources: Multimedia programmer (specify) (All Savers [employer]) Museum/gallery curator Resources: Employment Museum/gallery curator Screen Referred: No Living Expenses: Higher education careers adviser Management: Patient Does the patient have any problems obtaining your medications?: No Home Management: pt and wife share responsibilities Patient/Family Preliminary Plans: Pt plans to return home with wife as primary support. Social Work Anticipated Follow Up Needs: HH/OP, Support Group Expected length of stay: 7-10 days  Clinical Impression Pleasant gentleman here following BKA and able to complete assessment interview without difficulty. Very matter of fact with his answers and focused on therapy and regaining his independence ASAP. Does note still struggling with pain control and med adjustments being made. Denies any emotional distress related to BKA - will monitor. Wife able to provide any assistance needed. Will follow for support and d/c planning needs.  Samyah Bilbo 12/21/2015, 3:04 PM           Per therapies, pt is ready for d/c tomorrow and pt, family, MD all in agreement.  D/c 01/01/16.  Alejandro Adcox, LCSW

## 2015-12-31 NOTE — Progress Notes (Signed)
Retta Diones, RN Rehab Admission Coordinator Signed Physical Medicine and Rehabilitation PMR Pre-admission 12/28/2015 3:33 PM  Related encounter: Admission (Discharged) from 12/26/2015 in [facility] Collapse All   PMR Admission Coordinator Pre-Admission Assessment  Patient: Adam [name] is an 65 y.o., male MRN: [id number] DOB: 1951-04-11 Height: 5'11" Weight: 179 lbs Insurance Information HMO: PPO: Yes PCP: IPA: 80/20: OTHER: Group #[id number] PRIMARY: All Savers [facility] Policy#: [id number] Subscriber: [name] CM Name: [name]. Phone#: [phone] Fax#: [phone] Pre-Cert#: [id number] with update due on 01/01/16 Employer: [employer] Benefits: Phone #: [phone] Name: [name]. Date: 07/18/13 Deduct: $2000 (met $0) Out of Pocket Max: $4000 (met $229.01) Life Max: unlimited CIR: 100% after ded met SNF: 1005 after ded met and 60 days max Outpatient: 100% after ded met and 30 visits combined Co-Pay: none Home Health: 100% after ded met Co-Pay: none DME: 1005 after ded met Co-Pay: none Providers: in network  SECONDARY: Medicare A/B Policy#: [id number] T Subscriber: [name] CM Name: Phone#: Fax#:  Pre-Cert#: Employer: [employer] Benefits: Phone #: Name:  Eff. Date: Deduct: Out of Pocket Max: Life Max:  CIR: SNF:  Outpatient: Co-Pay:  Home Health: Co-Pay:  DME: Co-Pay:   Emergency Contact Information Contact Information    Name Relation Home Work Mobile   [place] Spouse   [phone]     Current Medical History  Patient Admitting Diagnosis: R AKA  History of Present Illness: A 65  y.o. right handed male with history of remote tobacco abuse, hypertension, diabetes mellitus and peripheral neuropathy, peripheral vascular disease status post right femoral-popliteal artery bypass 11/28/2015 and gangrenous right great toe status post right great toe amputation 12/01/2015 and chronic pain maintained on oxycodone 15 mg every 4 hours as needed. Patient lives with his wife. Independent prior to admission working full time up until latest femoral bypass surgery. 2 level home with 2 steps to entry and bedroom upstairs. Wife can assist as needed. Presented 12/15/2015 with severe right foot pain and poor healing of recent right great toe amputation with profound ischemic changes. Limb was not felt to be salvageable and underwent right below-knee amputation 12/16/2015 per Dr. Donnetta Hutching. Hospital course pain management with pharmacy consulted for pain control and wean from PCA placed on Oxycodone 40m every 4 hour,neurontin and fentanyl patch. Subcutaneous Lovenox for DVT prophylaxis. Physical and occupational therapy evaluations completed with recommendations of physical medicine rehabilitation consult. Patient was admitted for a comprehensive rehabilitation program 12/20/2015. Patient progressing nicely with therapies. Follow-up vascular surgery noting progressive ischemic changes nonhealing right BKA site felt AKA was needed. Patient was discharged to the services of vascular surgery 12/26/2015 and underwent right AKA for Dr. EDonnetta Hutching Hospital course ongoing pain management. Acute blood loss anemia 10.9 and monitored. Physical and occupational therapy evaluations completed postoperatively with recommendations of physical medicine rehabilitation consult. Patient to be Re-admitted for comprehensive inpatient rehabilitation program  Past Medical History  Past Medical History  Diagnosis Date  . Hypertension   . Peripheral vascular disease ([place]   . Gangrene ([place] 11/22/2015    right great toe    . OSA on CPAP   . Type II diabetes mellitus ([place] dx'd 11/2015  . PAD (peripheral artery disease) ([place]     Family History  family history includes Hypertension in his mother.  Prior Rehab/Hospitalizations: No previous rehab admissions. Did have bypass surgery and a toe amputation in early January 2017.  Has the patient had major surgery during 100  days prior to admission? Yes. See above  Current Medications   Current facility-administered medications:  . 0.9 % sodium chloride infusion, , Intravenous, Continuous, Samantha J Rhyne, PA-C, Last Rate: 75 mL/hr at 12/27/15 1053 . acetaminophen (TYLENOL) tablet 325-650 mg, 325-650 mg, Oral, Q4H PRN **OR** acetaminophen (TYLENOL) suppository 325-650 mg, 325-650 mg, Rectal, Q4H PRN, Samantha J Rhyne, PA-C . alum & mag hydroxide-simeth (MAALOX/MYLANTA) 200-200-20 MG/5ML suspension 15-30 mL, 15-30 mL, Oral, Q2H PRN, Samantha J Rhyne, PA-C . aspirin chewable tablet 81 mg, 81 mg, Oral, Daily, Samantha J Rhyne, PA-C, 81 mg at 12/28/15 1045 . atorvastatin (LIPITOR) tablet 10 mg, 10 mg, Oral, QODAY, Samantha J Rhyne, PA-C, 10 mg at 12/26/15 1749 . bisacodyl (DULCOLAX) suppository 10 mg, 10 mg, Rectal, Daily PRN, Samantha J Rhyne, PA-C . celecoxib (CELEBREX) capsule 200 mg, 200 mg, Oral, BID, Samantha J Rhyne, PA-C, 200 mg at 12/28/15 1048 . cyclobenzaprine (FLEXERIL) tablet 5 mg, 5 mg, Oral, TID PRN, Hulen Shouts Rhyne, PA-C, 5 mg at 12/28/15 0846 . diazepam (VALIUM) injection 5 mg, 5 mg, Intravenous, Q8H PRN, Hulen Shouts Rhyne, PA-C, 5 mg at 12/27/15 2150 . diphenhydrAMINE (BENADRYL) injection 12.5 mg, 12.5 mg, Intravenous, Q6H PRN **OR** diphenhydrAMINE (BENADRYL) 12.5 MG/5ML elixir 12.5 mg, 12.5 mg, Oral, Q6H PRN, [name], MD . docusate sodium (COLACE) capsule 100 mg, 100 mg, Oral, BID PRN, Samantha J Rhyne, PA-C . docusate sodium (COLACE) capsule 100 mg, 100 mg, Oral, Daily, Samantha J Rhyne, PA-C, 100 mg at 12/28/15  1046 . enoxaparin (LOVENOX) injection 30 mg, 30 mg, Subcutaneous, Q24H, Samantha J Rhyne, PA-C, 30 mg at 12/28/15 1451 . fentaNYL (DURAGESIC - dosed mcg/hr) 50 mcg, 50 mcg, Transdermal, Q72H, Samantha J Rhyne, PA-C, 50 mcg at 12/28/15 0011 . guaiFENesin-dextromethorphan (ROBITUSSIN DM) 100-10 MG/5ML syrup 15 mL, 15 mL, Oral, Q4H PRN, Samantha J Rhyne, PA-C . hydrALAZINE (APRESOLINE) injection 5 mg, 5 mg, Intravenous, Q20 Min PRN, Samantha J Rhyne, PA-C . lisinopril (PRINIVIL,ZESTRIL) tablet 20 mg, 20 mg, Oral, Daily, 20 mg at 12/28/15 1047 **AND** hydrochlorothiazide (MICROZIDE) capsule 12.5 mg, 12.5 mg, Oral, Daily, Samantha J Rhyne, PA-C, 12.5 mg at 12/28/15 1047 . HYDROmorphone (DILAUDID) 1 mg/mL PCA injection, , Intravenous, 6 times per day, [name], MD . labetalol (NORMODYNE,TRANDATE) injection 10 mg, 10 mg, Intravenous, Q10 min PRN, Hulen Shouts Rhyne, PA-C, 10 mg at 12/27/15 0434 . magnesium hydroxide (MILK OF MAGNESIA) suspension 30 mL, 30 mL, Oral, Daily PRN, Samantha J Rhyne, PA-C . magnesium sulfate IVPB 2 g 50 mL, 2 g, Intravenous, Daily PRN, Samantha J Rhyne, PA-C . metoprolol (LOPRESSOR) injection 2-5 mg, 2-5 mg, Intravenous, Q2H PRN, Samantha J Rhyne, PA-C . multivitamin with minerals tablet 1 tablet, 1 tablet, Oral, Daily, [name], MD, 1 tablet at 12/28/15 1048 . naloxone (NARCAN) injection 0.4 mg, 0.4 mg, Intravenous, PRN **AND** sodium chloride flush (NS) 0.9 % injection 9 mL, 9 mL, Intravenous, PRN, [name], MD . ondansetron (ZOFRAN) injection 4 mg, 4 mg, Intravenous, Q6H PRN, Samantha J Rhyne, PA-C . ondansetron (ZOFRAN) injection 4 mg, 4 mg, Intravenous, Q6H PRN, [name], MD . oxyCODONE (Oxy IR/ROXICODONE) immediate release tablet 15 mg, 15 mg, Oral, Q4H PRN, Samantha J Rhyne, PA-C, 15 mg at 12/28/15 1254 . pantoprazole (PROTONIX) EC tablet 40 mg, 40 mg, Oral, Daily, Samantha J Rhyne, PA-C, 40 mg at 12/28/15 1046 . phenol (CHLORASEPTIC) mouth  spray 1 spray, 1 spray, Mouth/Throat, PRN, Samantha J Rhyne, PA-C . potassium chloride SA (K-DUR,KLOR-CON) CR tablet 20-40  mEq, 20-40 mEq, Oral, Daily PRN, Hulen Shouts Rhyne, PA-C  Patients Current Diet: Diet regular Room service appropriate?: Yes; Fluid consistency:: Thin  Precautions / Restrictions Precautions Precautions: Fall Precaution Comments: R AKA Restrictions Weight Bearing Restrictions: Yes RLE Weight Bearing: Non weight bearing   Has the patient had 2 or more falls or a fall with injury in the past year?No  Prior Activity Level Community (5-7x/wk): Went out most days prior to toe amputation and surgery in early January 2017  [place] Devices/Equipment: CBG Meter, CPAP Home Equipment: Environmental consultant - 2 wheels, Crutches, Shower seat - built in  Prior Device Use: Indicate devices/aids used by the patient prior to current illness, exacerbation or injury? Walker and Crutches  Prior Functional Level Prior Function Level of Independence: Independent with assistive device(s)  Self Care: Did the patient need help bathing, dressing, using the toilet or eating? Needed some helpWife assisted with dressing post toe amputation.  Indoor Mobility: Did the patient need assistance with walking from room to room (with or without device)? Independent  Stairs: Did the patient need assistance with internal or external stairs (with or without device)? Independent  Functional Cognition: Did the patient need help planning regular tasks such as shopping or remembering to take medications? Independent  Current Functional Level Cognition  Overall Cognitive Status: Within Functional Limits for tasks assessed Orientation Level: Oriented X4   Extremity Assessment (includes Sensation/Coordination)  Upper Extremity Assessment: Defer to OT evaluation  Lower Extremity Assessment: Defer to PT evaluation RLE Deficits / Details: Positioned in hip flexion  due to tight hip flexors; limited AROM secondary to pain. Cues for glute and hamstring activation, able to improve right residual limb to almost neutral. RLE: Unable to fully assess due to pain RLE Sensation: decreased light touch    ADLs  Overall ADL's : Needs assistance/impaired Eating/Feeding: Set up, Sitting Grooming: Set up, Sitting Upper Body Bathing: Set up, Sitting Lower Body Bathing: Sit to/from stand, Maximal assistance Upper Body Dressing : Set up, Sitting Lower Body Dressing: Maximal assistance, Sit to/from stand Toilet Transfer: Minimal assistance, +2 for safety/equipment, Stand-pivot, RW Toilet Transfer Details (indicate cue type and reason): simulated bed to chair General ADL Comments: Pt fatigued, reporting having not slept well last night, but still agreeable to OOB to chair.    Mobility  Overal bed mobility: Needs Assistance Bed Mobility: Supine to Sit Supine to sit: Min guard, HOB elevated General bed mobility comments: Increased time, good use of UEs to scoot to EOB. No dizziness.    Transfers  Overall transfer level: Needs assistance Equipment used: Rolling walker (2 wheeled) Transfers: Sit to/from Stand Sit to Stand: Min assist, +2 safety/equipment Stand pivot transfers: Min assist, +2 safety/equipment General transfer comment: cues for hand placement, min assist to steady. SPT bed to chair with Min A for balance. Better able to extend right residual limb into neutral position with cues for glute activation.    Ambulation / Gait / Stairs / Wheelchair Mobility  Ambulation/Gait Ambulation/Gait assistance: Physicist, medical (Feet): 10 Feet Assistive device: Rolling walker (2 wheeled) Gait Pattern/deviations: Trunk flexed ("swing to") General Gait Details: Cues for RW proximity, positioning. Pt self limited ambulation distance today.  Gait velocity interpretation: Below normal speed for age/gender    Posture / Balance  Balance Overall balance assessment: Needs assistance Sitting-balance support: Feet supported, Bilateral upper extremity supported Sitting balance-Leahy Scale: Fair Standing balance support: During functional activity Standing balance-Leahy Scale: Poor Standing balance comment: Reliant on  UEs for support. No dizziness today.    Special needs/care consideration BiPAP/CPAP Yes, has CPAP at home and in the acute hospital CPM NO Continuous Drip IV 0.9% NS 75 mL/hr Dialysis No  Life Vest No Oxygen No Special Bed No Trach Size No Wound Vac (area) No  Skin Has a new R AKA incision with dressing and ace wrap. Has a decubitus ulcer on right buttock per wife new since admission. Has a rash on his face. L Bowel mgmt: Last BM 12/25/15 Bladder mgmt: WDL Diabetic mgmt Yes, is a diabetic, new diagnosis.    Previous Home Environment Living Arrangements: Spouse/significant other Lives With: Spouse Available Help at Discharge: Family, Available 24 hours/day Type of Home: House Home Layout: Two level, Bed/bath upstairs, 1/2 bath on main level Alternate Level Stairs-Rails: Left Alternate Level Stairs-Number of Steps: 15 then landing then 6 more Home Access: Stairs to enter Entrance Stairs-Rails: None Entrance Stairs-Number of Steps: 4 from garage Bathroom Shower/Tub: Multimedia programmer: [place]: No Additional Comments: Pt has 2 steps in garage w/o rails, has 3 steps in front of the house w/ Lt and Rt rails, and has 1 flight to get upstairs to his bedroom w/ Lt rail  Discharge Living Setting Plans for Discharge Living Setting: Patient's home, House, Lives with (comment) (Lives with wife.) Type of Home at Discharge: House Discharge Home Layout: Multi-level, 1/2 bath on main level, Bed/bath upstairs Alternate Level Stairs-Number of Steps: 13 steps then a landing then 6 steps Discharge Home Access: Stairs to  enter Entrance Stairs-Rails: None Entrance Stairs-Number of Steps: 2 steps at garage entry Does the patient have any problems obtaining your medications?: No  Social/Family/Support Systems Patient Roles: Spouse, Parent (Has a wife.) Contact Information: Laney Bagshaw - wife Anticipated Caregiver: wife and self Anticipated Caregiver's Contact Information: [name] - wife - [phone] Ability/Limitations of Caregiver: Wife works PT and has a flexible schedule Caregiver Availability: 24/7 Discharge Plan Discussed with Primary Caregiver: Yes Is Caregiver In Agreement with Plan?: Yes Does Caregiver/Family have Issues with Lodging/Transportation while Pt is in Rehab?: No  Goals/Additional Needs Patient/Family Goal for Rehab: Mod I and supervision goals Expected length of stay: 5-7 days Cultural Considerations: None Dietary Needs: Regular diet, thin liquids Equipment Needs: TBD Pt/Family Agrees to Admission and willing to participate: Yes Program Orientation Provided & Reviewed with Pt/Caregiver Including Roles & Responsibilities: Yes  Decrease burden of Care through IP rehab admission: N/A  Possible need for SNF placement upon discharge: Not planned  Patient Condition: This patient's medical and functional status has changed since the consult dated: Original consult done 12/17/15 in which the Rehabilitation Physician determined and documented that the patient's condition is appropriate for intensive rehabilitative care in an inpatient rehabilitation facility. See "History of Present Illness" (above) for medical update. Functional changes are: Currently requiring min assist for transfers and minguard 10 feet RW. Patient's medical and functional status update has been discussed with the Rehabilitation physician and patient remains appropriate for inpatient rehabilitation. Will admit to inpatient rehab tomorrow.  Preadmission Screen Completed By: Retta Diones, 12/28/2015 4:05  PM ______________________________________________________________________  Discussed status with Dr. Naaman Plummer On 12/28/15 at 1613 and received telephone approval for admission tomorrow.  Admission Coordinator: Retta Diones, time1613/Date02/10/17          Cosigned by: Meredith Staggers, MD at 12/28/2015 4:50 PM  Revision History     Date/Time User Provider Type Action   12/28/2015 4:50 PM Meredith Staggers, MD Physician Cosign  12/28/2015 4:13 PM Retta Diones, RN Rehab Admission Coordinator Sign

## 2015-12-31 NOTE — Progress Notes (Signed)
[place] PHYSICAL MEDICINE & REHABILITATION     PROGRESS NOTE  Subjective/Complaints:  Pt sitting in bed this AM.  He  States th therapies are going well wounds he believes he is making good progress. He would like to know when he will be abl to go home.  ROS: +Mild stump pain. Denies CP, SOB, n/v/d.  Objective: Vital Signs: Blood pressure 133/71, pulse 55, temperature 98.1 F (36.7 C), temperature source Oral, resp. rate 17, height 5\' 11"  (1.803 m), weight 79.017 kg (174 lb 3.2 oz), SpO2 99 %. No results found.  Recent Labs  12/31/15 0520  WBC 8.7  HGB 10.9*  HCT 33.7*  PLT 522*    Recent Labs  12/31/15 0520  NA 140  K 4.2  CL 102  GLUCOSE 102*  BUN 16  CREATININE 0.64  CALCIUM 9.2   CBG (last 3)  No results for input(s): GLUCAP in the last 72 hours.  Wt Readings from Last 3 Encounters:  12/29/15 79.017 kg (174 lb 3.2 oz)  12/20/15 80.604 kg (177 lb 11.2 oz)  12/15/15 81.4 kg (179 lb 7.3 oz)    Physical Exam:  BP 133/71 mmHg  Pulse 55  Temp(Src) 98.1 F (36.7 C) (Oral)  Resp 17  Ht 5\' 11"  (1.803 m)  Wt 79.017 kg (174 lb 3.2 oz)  BMI 24.31 kg/m2  SpO2 99% Constitutional: He appears well-developed and well-nourished. NAD.  Vital signs reviewed. HENT: Normocephalic and atraumatic.  Eyes: Conjunctivae and EOM are normal.  Cardiovascular: Normal rate and regular rhythm.  Respiratory: Effort normal and breath sounds normal. No respiratory distress.  GI: Soft. Bowel sounds are normal. He exhibits no distension.  Musculoskeletal: He exhibits mild edema and minimal tenderness.  Neurological: He is alert and oriented. Motor: B/l UE, LLE 5/5 RLE: hip flexion 4/5 (pain inhibition)  Skin: Skin is warm and dry.  AKA with staples c/d/i. Psychiatric: He has a normal mood and affect. His behavior is normal. Thought content normal.   Assessment/Plan: 1. Functional deficits secondary to right AKA after failed BKA which require 3+ hours per day of  interdisciplinary therapy in a comprehensive inpatient rehab setting. Physiatrist is providing close team supervision and 24 hour management of active medical problems listed below. Physiatrist and rehab team continue to assess barriers to discharge/monitor patient progress toward functional and medical goals.  Function:  Bathing Bathing position Bathing activity did not occur: Refused    Bathing parts      Bathing assist        Upper Body Dressing/Undressing Upper body dressing   What is the patient wearing?: Pull over shirt/dress     Pull over shirt/dress - Perfomed by patient: Thread/unthread right sleeve, Thread/unthread left sleeve, Put head through opening, Pull shirt over trunk          Upper body assist Assist Level: Set up   Set up : To obtain clothing/put away  Lower Body Dressing/Undressing Lower body dressing   What is the patient wearing?: Pants, Socks, Underwear Underwear - Performed by patient: Thread/unthread left underwear leg, Thread/unthread right underwear leg Underwear - Performed by helper: Pull underwear up/down Pants- Performed by patient: Thread/unthread right pants leg, Thread/unthread left pants leg, Fasten/unfasten pants Pants- Performed by helper: Pull pants up/down       Socks - Performed by helper: Don/doff left sock              Lower body assist Assist for lower body dressing: Touching or steadying assistance (Pt > 75%)  Toileting Toileting   Toileting steps completed by patient: Adjust clothing prior to toileting, Performs perineal hygiene, Adjust clothing after toileting Toileting steps completed by helper: Performs perineal hygiene, Adjust clothing after toileting Toileting Assistive Devices: Grab bar or rail  Toileting assist     Transfers Chair/bed transfer     Chair/bed transfer assist level: Supervision or verbal cues       Locomotion Ambulation     Max distance: 75 Assist level: Touching or steadying  assistance (Pt > 75%)   Wheelchair   Type: Manual Max wheelchair distance: 200 Assist Level: Supervision or verbal cues  Cognition Comprehension Comprehension assist level: Follows basic conversation/direction with extra time/assistive device  Expression Expression assist level: Expresses complex 90% of the time/cues < 10% of the time  Social Interaction Social Interaction assist level: Interacts appropriately with others - No medications needed.  Problem Solving Problem solving assist level: Solves complex problems: With extra time  Memory Memory assist level: Recognizes or recalls 90% of the time/requires cueing < 10% of the time    Medical Problem List and Plan: 1. Right AKA 12/26/2015 after failed BKA secondary to progressive ischemic changes with peripheral vascular disease 2. DVT Prophylaxis/Anticoagulation: Subcutaneous Lovenox. Monitor platelet counts and any signs of bleeding 3. Pain Management: Celebrex 200 mg twice a day, fentanyl patch 50 g change every 72 hours, decreased to 25 on 2/13, oxycodone 15 decreased to 5 mg every 4 hours and Flexeril as needed.  Much better controlled than prior to AKA, will begin weaning medications 4. Acute blood loss anemia.    hemoglobin 10.9 on 2/13 5. Neuropsych: This patient is capable of making decisions on his own behalf. 6. Skin/Wound Care: Routine skin checks 7. Fluids/Electrolytes/Nutrition: Routine I&O    BMP within normal limits on 2/13 8. Hypertension. Lisinopril 20 mg daily, HCTZ 12.5 mg daily. Monitor with increased mobility  Stable at present 9. Hyperlipidemia. Lipitor 10. Diet-controlled diabetes mellitus. Hemoglobin A1c 5.7. Blood sugar checks discontinued averaging 93-105. 11. Transaminitis  LFTs improving  LOS (Days) 2 A FACE TO FACE EVALUATION WAS PERFORMED  Ankit Lorie Phenix 12/31/2015 8:58 AM

## 2015-12-31 NOTE — Progress Notes (Signed)
Social Work Patient ID: Adam [name], male   DOB: 07/27/1951, 64 y.o.   MRN: [id number]   As stated in previous note, therapies, MD, pt and family all believe pt is ready for d/c home tomorrow.  HH and DME all arranged and family education completed today.  No further concerns.  Zoye Chandra, LCSW

## 2015-12-31 NOTE — Progress Notes (Signed)
Physical Therapy Session Note  Patient Details  Name: Adam [name] MRN: [id number] Date of Birth: [birth year]  Today's Date: 12/31/2015 PT Individual Time: [id number] PT Individual Time Calculation (min): 30 min   Short Term Goals: Week 1:  PT Short Term Goal 1 (Week 1): STG=LTG  Skilled Therapeutic Interventions/Progress Updates:   Patient and wife seen for skilled treatment with focus on reviewing HEP for RLE strengthening and ROM. Patient propelled wheelchair throughout rehab unit with mod I, performed sit <> stand using RW for toileting with distant supervision, and performed stand pivot transfer using RW and engaged in bed mobility on raised mat table to simulate home environment due to patient and wife report of having a high bed with mod I. Instructed in prone positioning up to 30 min/day for passive hip flexor stretch, supine glute sets, prone hip extension, sidelying hip flexion/extension, sidelying hip abduction with verbal/tactile cues to prevent hip flexion and lumbar extension. Handout provided. Patient left in gym with wife, handoff to OT.   Therapy Documentation Precautions:  Precautions Precautions: Fall Precaution Comments: R AKA Restrictions Weight Bearing Restrictions: Yes RLE Weight Bearing: Non weight bearing Pain: Pain Assessment Pain Assessment: Faces Pain Score: 5  Faces Pain Scale: Hurts little more Pain Type: Acute pain Pain Location: Leg Pain Orientation: Right Pain Descriptors / Indicators: Aching;Grimacing Pain Frequency: Intermittent Pain Onset: With Activity Patients Stated Pain Goal: 4 Pain Intervention(s): Repositioned;Rest   See Function Navigator for Current Functional Status.   Therapy/Group: Individual Therapy  Laretta Alstrom 12/31/2015, 1:41 PM

## 2015-12-31 NOTE — Discharge Instructions (Signed)
Inpatient Rehab Discharge Instructions  Adam [name] Discharge date and time: No discharge date for patient encounter.   Activities/Precautions/ Functional Status: Activity: activity as tolerated Diet: diabetic diet Wound Care: keep wound clean and dry Functional status:  ___ No restrictions     ___ Walk up steps independently ___ 24/7 supervision/assistance   ___ Walk up steps with assistance ___ Intermittent supervision/assistance  ___ Bathe/dress independently ___ Walk with walker     __x_ Bathe/dress with assistance ___ Walk Independently    ___ Shower independently ___ Walk with assistance    ___ Shower with assistance ___ No alcohol     ___ Return to work/school ________   COMMUNITY REFERRALS UPON DISCHARGE:    Home Health:   PT      RN                     Agency:  [facility] Phone: [phone]   Medical Equipment/Items Ordered:  Wheelchair, cushion, 3n1 commde                                                      Agency/Supplier:  [facility] @ [phone]   GENERAL COMMUNITY RESOURCES FOR PATIENT/FAMILY:  Support Groups:  Amputee Support Group        Special Instructions:    My questions have been answered and I understand these instructions. I will adhere to these goals and the provided educational materials after my discharge from the hospital.  Patient/Caregiver Signature _______________________________ Date __________  Clinician Signature _______________________________________ Date __________  Please bring this form and your medication list with you to all your follow-up doctor's appointments.

## 2015-12-31 NOTE — Progress Notes (Signed)
Physical Therapy Session Note  Patient Details  Name: Adam [name] MRN: [id number] Date of Birth: 12/01/1950  Today's Date: 12/31/2015 PT Individual Time: 1100-1200 PT Individual Time Calculation (min): 60 min   Short Term Goals: Week 1:  PT Short Term Goal 1 (Week 1): STG=LTG  Skilled Therapeutic Interventions/Progress Updates:    Patient and wife present for session.  Patient propelled w/c mod I over level tile in controlled environment.  Discussed w/c widths and probable width of doorways in his home to ensure ordering of correct DME for home.  Patient negotiated 4 steps x 2 (once with PT assist and once with wife assist,) with crutch and rail with min A.  Wife given cues for safe and appropriate level of assist and return demonstrated.  Patient ambulated over level tile with RW and supervision.  Wife able to return demonstrate appropriate level of assist for ambulation over 100' with walker.  Patient performed car transfer supervision with wife present and able to demonstrate appropriate management of w/c into car as well as  Supervision level of assist with stand turn with RW.  Patient performed furniture transfers with RW and supervision to/from couch and bed in ADL apartment.  Demonstrated floor transfer as well as bumping up steps on bottom with recovery near top of steps and hopping up last 2-3 steps with rail and crutch.  Patient did not wish to perform, but notes will have HHPT and able to practice on his steps at home.  States may get bed to use on bottom level of home initially.  Patient issued amputee (AKA) exercises and residual limb wrapping information, but noted to have soft compression sleeve on and assisted pt to adjust in standing.  Wife reports has second sleeve for when needs changing.  Discussed best to have rails installed on entry steps with pt and wife and option for temporary ramp if they wish.  Patient left with wife in room and all needs in reach, Therapy  Documentation Precautions:  Precautions Precautions: Fall Precaution Comments: R AKA Restrictions Weight Bearing Restrictions: Yes RLE Weight Bearing: Non weight bearing  Pain: Pain Assessment Pain Assessment: 0-10 Pain Score: 5  Pain Type: Acute pain Pain Location: Leg Pain Orientation: Right Pain Descriptors / Indicators: Aching Pain Frequency: Intermittent Pain Onset: On-going Patients Stated Pain Goal: 4 Pain Intervention(s): Distraction     See Function Navigator for Current Functional Status.   Therapy/Group: Individual Therapy  Reginia Naas  [place], [place] 12/31/2015  12/31/2015, 1:22 PM

## 2015-12-31 NOTE — Discharge Summary (Signed)
[name], [name] NO.:  [id number]  MEDICAL RECORD NO.:  [id number]  LOCATION:  [id number]                        FACILITY:  [facility]  PHYSICIAN:  Delice Lesch, MD        DATE OF BIRTH:  May 09, 1951  DATE OF ADMISSION:  12/29/2015 DATE OF DISCHARGE:  01/01/2016                              DISCHARGE SUMMARY   DISCHARGE DIAGNOSES: 1. Right above-knee amputation, December 26, 2015 after failed below-     knee amputation secondary to peripheral vascular disease. 2. Subcutaneous Lovenox for DVT prophylaxis. 3. Pain management. 4. Acute blood loss anemia. 5. Hypertension. 6. Hyperlipidemia. 7. Diet controlled diabetes mellitus. 8. Phantom limb pain  HISTORY OF PRESENT ILLNESS:  This is a 65 year old right-handed male with history of hypertension, diabetes mellitus, peripheral vascular disease with multiple revascularization procedures as well as chronic pain.  He lives with his wife, independent prior to admission.  He presented on December 15, 2015 with severe right foot pain and poor healing of a recent right great toe amputation with profound ischemic changes.  Limb was not felt to be salvageable and underwent below-knee amputation on December 16, 2015 per Dr. Donnetta Hutching.  Hospital course, pain management.  Physical and occupational therapy ongoing.  The patient was admitted for a comprehensive rehab program on December 20, 2015. Followup with Vascular Surgery noted progressive ischemic changes of the stump site, nonhealing, felt above-knee amputation was needed.  He was discharged to [facility] and underwent right above-knee amputation on December 26, 2015 per Dr. Donnetta Hutching.  Hospital course, ongoing pain management.  Anemia 10.9.  The patient was readmitted for a comprehensive rehab program.  PAST MEDICAL HISTORY:  See discharge diagnoses.  SOCIAL HISTORY:  Lives with wife.  He was independent prior to hospital admission.  Functional status upon admission to [facility] was +2 for safety for sit to stand, needing assist overall level transfers, min to mod assist for activities of daily living.  PHYSICAL EXAMINATION:  VITAL SIGNS:  Blood pressure 128/63, pulse 56, temperature 98, respirations 18. GENERAL:  The patient was alert and oriented x3. LUNGS:  Clear to auscultation.  No wheeze. CARDIAC:  Regular rhythm.  No murmur. ABDOMEN:  Soft, nontender.  Good bowel sounds. EXTREMITIES:  His above-knee amputation site was dressed, appropriately tender, no drainage, without odor.  Neurovascular sensation intact.  REHABILITATION HOSPITAL COURSE:  The patient was admitted to Inpatient Rehab Services.  Therapies were initiated on a 3-hour daily basis.  The following issues were addressed during the patient's rehabilitation stay.  Pertaining to Mr. Adam [name] right above-knee amputation on December 26, 2015, he received followup by Vascular Surgery, Dr. Donnetta Hutching. Surgical site is healing nicely.  Neurovascular sensation intact. Subcutaneous Lovenox for DVT prophylaxis.  No bleeding episodes. Chronic pain management.  He continued on Celebrex 200 mg twice daily; a Duragesic patch, decreased to 25 mg.  He was using oxycodone 5 mg every 4 hours as needed as well as Flexeril.  Acute blood loss anemia 10.9 and monitored.  Blood pressures were controlled on lisinopril and hydrochlorothiazide.  He would follow up with his primary MD.  Diet- controlled diabetes mellitus.  Blood sugars 93-105.  Hemoglobin  A1c of 5.7.  The patient received physical and occupational therapy evaluations, completion ongoing with discussion on any barriers to discharge and ongoing family teaching.  The following issues were addressed during the patient's rehabilitation stay.  The patient was requiring minimal guard to ambulate 50 feet x2 with a rolling walker. No loss of balance.  He needed some standing rest breaks.  Stair negotiation with two hand rails, minimal assistance.  He could  gather his belongings for activities of daily living and homemaking, needing some assist for lower body dressing provided by his wife.  He had no bowel or bladder disturbances.  The patient with progressive gains during his rehab stay.  He was discharged to home on January 01, 2016.  DISCHARGE MEDICATIONS: 1. Aspirin 81 mg p.o. daily. 2. Lipitor 10 mg p.o. daily. 3. Celebrex 200 mg p.o. daily. 4. Flexeril 5 mg t.i.d. as needed. 5. Colace 100 mg p.o. daily. 6. Duragesic patch 25 mcg every 72 hours. 7. Lisinopril 20 mg p.o. daily. 8. Hydrochlorothiazide 12.5 mg p.o. daily. 9. Multivitamin daily. 10.Oxycodone immediate release 5 mg every 4 hours as needed for     moderate pain, dispense of 90 tablets. 11.Protonix 40 mg p.o. Daily. 12. Gabapentin 300 q.h.s.  DIET:  His diet was regular.  DISCHARGE INSTRUCTIONS:  He would follow up with Dr. Delice Lesch, Outpatient Rehab as directed; Dr. Sherren Mocha Early in 2 weeks, Vascular Surgery; Dr. Jill Alexanders, Medical Management.     Lauraine Rinne, P.A.   ______________________________ Delice Lesch, MD    DA/MEDQ  D:  12/31/2015  T:  12/31/2015  Job:  [id number]  cc:   TODD F EARLY Jill Alexanders, M.D.

## 2015-12-31 NOTE — Progress Notes (Signed)
Adam Anil Patel, MD Physician Signed Physical Medicine and Rehabilitation Consult Note 12/17/2015 6:33 AM  Related encounter: ED to Hosp-Admission (Discharged) from 12/15/2015 in [facility] 2W CARDIAC UNIT    Expand All Collapse All        Physical Medicine and Rehabilitation Consult Reason for Consult: Right BKA Referring Physician: Dr. Early   HPI: Adam [name] is a 65 y.o. right handed male with history of remote tobacco abuse, hypertension, diabetes mellitus and peripheral neuropathy, peripheral vascular disease status post right femoral-popliteal artery bypass 11/28/2015 and gangrenous right great toe status post right great toe amputation 12/01/2015. Patient lives with his wife. Independent prior to admission working full time up until latest femoral bypass surgery. 2 level home with 2 steps to entry and bedroom upstairs. Wife can assist as needed. Presented 12/15/2015 with severe right foot pain and poor healing of recent right great toe amputation with profound ischemic changes. Limb was not felt to be salvageable and underwent right below-knee amputation 12/16/2015 per Dr. Early. Hospital course pain management. Subcutaneous Lovenox for DVT prophylaxis. Physical and occupational therapy evaluations pending. M.D. has requested physical medicine rehabilitation consult.   Review of Systems  Constitutional: Negative for fever and chills.  HENT: Negative for hearing loss.  Eyes: Negative for blurred vision and double vision.  Respiratory: Negative for cough and shortness of breath.  Cardiovascular: Positive for leg swelling. Negative for chest pain and palpitations.  Gastrointestinal: Positive for constipation. Negative for nausea and vomiting.  Genitourinary: Negative for dysuria and hematuria.  Musculoskeletal: Positive for myalgias and joint pain.   Severe right foot pain  Skin: Negative for rash.  Neurological: Negative for seizures, loss of  consciousness and headaches.  All other systems reviewed and are negative.  Past Medical History  Diagnosis Date  . Diabetes mellitus without complication (HCC)   . Hypertension   . Peripheral vascular disease (HCC)   . Gangrene (HCC) 11/22/2015    right great toe  . Sleep apnea     uses cpap   Past Surgical History  Procedure Laterality Date  . Peripheral vascular catheterization N/A 11/26/2015    Procedure: Abdominal Aortogram; Surgeon: Christopher S Dickson, MD; Location: [facility] INVASIVE CV LAB; Service: Cardiovascular; Laterality: N/A;  . Hernia repair Bilateral 2000    inguinal  . Vasectomy    . Colonoscopy    . Femoral-popliteal bypass graft Right 11/28/2015    Procedure: RIGHT FEMORAL-POPLITEAL ARTERY BYPASS GRAFT AND CONSTRUCTION OF MILLER CUFF USING 6 MM X 80 CM PROPATEN GRAFT ; Surgeon: Brian L Chen, MD; Location: [facility] OR; Service: Vascular; Laterality: Right;  . Amputation Right 12/01/2015    Procedure: AMPUTATION RIGHT GREAT; Surgeon: Brian L Chen, MD; Location: [facility] OR; Service: Vascular; Laterality: Right;   Family History  Problem Relation Age of Onset  . Hypertension Mother    Social History:  reports that he quit smoking about 4 years ago. He has never used smokeless tobacco. He reports that he drinks about 4.2 oz of alcohol per week. He reports that he does not use illicit drugs. Allergies: No Known Allergies Medications Prior to Admission  Medication Sig Dispense Refill  . amoxicillin-clavulanate (AUGMENTIN) 875-125 MG tablet Take 1 tablet by mouth 2 (two) times daily. 20 tablet 0  . aspirin 81 MG tablet Take 81 mg by mouth daily.    . atorvastatin (LIPITOR) 10 MG tablet Take 1 tablet (10 mg total) by mouth daily. (Patient taking differently: Take 10 mg by mouth every other day. )   90 tablet 3  . docusate sodium (COLACE) 100 MG capsule Take 100 mg by mouth 2 (two) times  daily as needed for mild constipation.    . lisinopril-hydrochlorothiazide (PRINZIDE,ZESTORETIC) 20-12.5 MG per tablet Take 1 tablet by mouth daily. 90 tablet 3  . Multiple Vitamin (MULTIVITAMIN) tablet Take 1 tablet by mouth daily.    . oxyCODONE 10 MG TABS Take 1 tablet (10 mg total) by mouth every 4 (four) hours as needed for moderate pain. (Patient taking differently: Take 15 mg by mouth every 4 (four) hours as needed for moderate pain. ) 50 tablet 0  . oxyCODONE-acetaminophen (PERCOCET) 10-325 MG tablet Take 1 tablet by mouth every 4 (four) hours as needed for pain. 30 tablet 0  . oxyCODONE-acetaminophen (PERCOCET/ROXICET) 5-325 MG tablet Take 1-2 tablets by mouth every 6 (six) hours as needed for moderate pain. 30 tablet 0  . Blood Glucose Monitoring Suppl (ONE TOUCH ULTRA MINI) w/Device KIT     . ONE TOUCH ULTRA TEST test strip     . ONETOUCH DELICA LANCETS 33G MISC     . traMADol (ULTRAM) 50 MG tablet Take 1 tablet (50 mg total) by mouth every 8 (eight) hours as needed. (Patient not taking: Reported on 12/15/2015) 30 tablet 0    Home: Home Living Family/patient expects to be discharged to:: Private residence Living Arrangements: Spouse/significant other  Functional History:   Functional Status:  Mobility:          ADL:    Cognition: Cognition Orientation Level: Oriented X4    Blood pressure 176/77, pulse 74, temperature 99.5 F (37.5 C), temperature source Oral, resp. rate 18, height 5' 11" (1.803 m), weight 81.4 kg (179 lb 7.3 oz), SpO2 99 %. Physical Exam  Vitals reviewed. Constitutional: He is oriented to person, place, and time. He appears well-developed and well-nourished.  HENT:  Head: Normocephalic and atraumatic.  Eyes: Conjunctivae and EOM are normal.  Neck: Normal range of motion. Neck supple. No thyromegaly present.  Cardiovascular: Normal rate, regular rhythm and intact distal pulses.  Murmur  heard. Respiratory: Effort normal and breath sounds normal. No respiratory distress.  GI: Soft. Bowel sounds are normal. He exhibits no distension.  Musculoskeletal: He exhibits edema (RLE) and tenderness (RLE).  Neurological: He is alert and oriented to person, place, and time.  Sensation intact to light touch Motor: B/l UE, LLE 5/5 RLE: hip flexion 3-/5 (pain inhibition)  Skin: Skin is warm and dry.  Right BKA site is dressed appropriately tender  Psychiatric: He has a normal mood and affect. His behavior is normal.     Lab Results Last 24 Hours    Results for orders placed or performed during the hospital encounter of 12/15/15 (from the past 24 hour(s))  Glucose, capillary Status: None   Collection Time: 12/16/15 9:12 AM  Result Value Ref Range   Glucose-Capillary 96 65 - 99 mg/dL   Comment 1 Notify RN    Comment 2 Document in Chart   Glucose, capillary Status: Abnormal   Collection Time: 12/16/15 11:26 AM  Result Value Ref Range   Glucose-Capillary 120 (H) 65 - 99 mg/dL  Glucose, capillary Status: Abnormal   Collection Time: 12/16/15 4:30 PM  Result Value Ref Range   Glucose-Capillary 134 (H) 65 - 99 mg/dL  Glucose, capillary Status: Abnormal   Collection Time: 12/16/15 9:10 PM  Result Value Ref Range   Glucose-Capillary 118 (H) 65 - 99 mg/dL   Comment 1 Notify RN    Comment 2 Document   in Chart   Basic metabolic panel Status: Abnormal   Collection Time: 12/17/15 4:30 AM  Result Value Ref Range   Sodium 137 135 - 145 mmol/L   Potassium 3.9 3.5 - 5.1 mmol/L   Chloride 98 (L) 101 - 111 mmol/L   CO2 30 22 - 32 mmol/L   Glucose, Bld 138 (H) 65 - 99 mg/dL   BUN 9 6 - 20 mg/dL   Creatinine, Ser 0.72 0.61 - 1.24 mg/dL   Calcium 9.3 8.9 - 10.3 mg/dL   GFR calc non Af Amer >60 >60 mL/min   GFR calc Af Amer >60 >60 mL/min   Anion gap 9 5 - 15   CBC Status: Abnormal   Collection Time: 12/17/15 4:30 AM  Result Value Ref Range   WBC 11.8 (H) 4.0 - 10.5 K/uL   RBC 4.09 (L) 4.22 - 5.81 MIL/uL   Hemoglobin 12.6 (L) 13.0 - 17.0 g/dL   HCT 38.2 (L) 39.0 - 52.0 %   MCV 93.4 78.0 - 100.0 fL   MCH 30.8 26.0 - 34.0 pg   MCHC 33.0 30.0 - 36.0 g/dL   RDW 12.2 11.5 - 15.5 %   Platelets 327 150 - 400 K/uL      Imaging Results (Last 48 hours)    No results found.    Assessment/Plan: Diagnosis: Right BKA Labs independently reviewed. Records reviewed and summated above. PT/OT for mobility, ADL's, strengthening,and wheelchair training Clean amputation daily with soap and water Monitor incision site for signs of infection or impending skin breakdown. Staples to remain in place for 3-4 weeks Stump shrinker, for edema control  Scar mobilization massaging to prevent soft tissue adherence Stump protector during therapies Prevent flexion contractures by implementing the following:  Encourage prone lying for 20-30 mins per day BID to avoid hip flexion Contractures if medically appropriate; Avoid pillow under knees when patient is lying in bed in order to prevent both knee and hip flexion contractures; Avoid prolonged sitting Post surgical pain control with oral medication Phantom limb pain control with physical modalities including desensitization techniques (gentle self massage to the residual stump,hot packs if sensation iintact, US) and mirror therapy, TENS. If ineffective, consider pharmacological treatment for neuropathic pain (e.g gabapentin, pregabalin, amytriptalyine, duloxetine).  When using wheelchair, patient should have knee on amputated side fully extended with board under the seat cushion. Avoid injury to contralateral side  1. Does the need for close, 24 hr/day medical supervision in concert with the patient's rehab  needs make it unreasonable for this patient to be served in a less intensive setting? Yes 2. Co-Morbidities requiring supervision/potential complications: diabetes mellitus and peripheral neuropathy (Monitor in accordance with exercise and adjust meds as necessary), peripheral vascular disease status post right femoral-popliteal artery bypass and right great toe amputation, HTN (monitor and provide prns in accordance with increased physical exertion and pain), SIRS (tachypnea, fevers, leukocytosis - monitor for signs/symptoms of infection and consider additional workup if necessary), post-op pain (Biofeedback training with therapies to help reduce reliance on opiate pain medications, monitor pain control during therapies, and sedation at rest and titrate to maximum efficacy to ensure participation and gains in therapies), ABLA (transfuse if necessary to ensure appropriate perfusion for increased activity tolerance) 3. Due to safety, skin/wound care, disease management, pain management and patient education, does the patient require 24 hr/day rehab nursing? Yes 4. Does the patient require coordinated care of a physician, rehab nurse, PT (1-2 hrs/day, 5 days/week) and OT (1-2 hrs/day, 5 days/week) to address   physical and functional deficits in the context of the above medical diagnosis(es)? Yes Addressing deficits in the following areas: balance, endurance, locomotion, strength, transferring, bathing, dressing, toileting and psychosocial support 5. Can the patient actively participate in an intensive therapy program of at least 3 hrs of therapy per day at least 5 days per week? Potentially 6. The potential for patient to make measurable gains while on inpatient rehab is excellent 7. Anticipated functional outcomes upon discharge from inpatient rehab are TBD with PT, TBD with OT, n/a with SLP. 8. Estimated rehab length of stay to reach the above functional goals is: TBD 9. Does the patient have adequate  social supports and living environment to accommodate these discharge functional goals? Yes 10. Anticipated D/C setting: Home 11. Anticipated post D/C treatments: HH therapy and Home excercise program 12. Overall Rehab/Functional Prognosis: excellent  RECOMMENDATIONS: This patient's condition is appropriate for continued rehabilitative care in the following setting: Likely CIR, however, will need formal therapy consults and medical stability (improvement in SIRS and pain) Patient has agreed to participate in recommended program. Yes Note that insurance prior authorization may be required for reimbursement for recommended care.  Comment: Rehab Admissions Coordinator to follow up.  Adam Patel, MD 12/17/2015       Revision History     Date/Time User Provider Type Action   12/17/2015 10:12 AM Adam Anil Patel, MD Physician Sign   12/17/2015 6:51 AM Daniel J Angiulli, PA-C Physician Assistant Pend   View Details Report       Routing History     Date/Time From To Method   12/17/2015 10:12 AM Adam Anil Patel, MD Adam Anil Patel, MD In Basket   12/17/2015 10:12 AM Adam Anil Patel, MD John C Lalonde, MD In Basket         

## 2015-12-31 NOTE — IPOC Note (Signed)
Overall Plan of Care [facility]) Patient Details Name: Adam [name] MRN: [id number] DOB: 1951/07/07  Admitting Diagnosis: AKA  Hospital Problems: Active Problems:   Above knee amputation of right lower extremity (HCC)   PVD (peripheral vascular disease) ([place])   Benign essential HTN   HLD (hyperlipidemia)   Type 2 diabetes mellitus with diabetic peripheral angiopathy and gangrene, without long-term current use of insulin ([place])     Functional Problem List: Nursing Bowel, Edema, Endurance, Medication Management, Pain, Safety, Sensory, Skin Integrity  PT Balance, Endurance, Motor, Pain, Safety  OT Balance, Edema, Endurance, Motor, Nutrition, Skin Integrity  SLP    TR         Basic ADL's: OT Grooming, Bathing, Dressing, Toileting     Advanced  ADL's: OT       Transfers: PT Bed Mobility, Bed to Chair, Car, Manufacturing systems engineer, Metallurgist: PT Stairs, Emergency planning/management officer, Ambulation     Additional Impairments: OT None  SLP        TR      Anticipated Outcomes Item Anticipated Outcome  Self Feeding I   Swallowing      Basic self-care  mod I   Toileting  mod I    Bathroom Transfers mod I   Bowel/Bladder  Pt manage bowel and bladder Mod I  Transfers  mod I  Locomotion  mod I w/c, supervision stairs  Communication     Cognition     Pain  6 or less  Safety/Judgment  Mod I   Therapy Plan: PT Intensity: Minimum of 1-2 x/day ,45 to 90 minutes PT Frequency: 5 out of 7 days PT Duration Estimated Length of Stay: 3 OT Intensity: Minimum of 1-2 x/day, 45 to 90 minutes OT Frequency: 5 out of 7 days OT Duration/Estimated Length of Stay: 3 days         Team Interventions: Nursing Interventions Patient/Family Education, Bowel Management, Disease Management/Prevention, Pain Management, Medication Management, Skin Care/Wound Management  PT interventions Ambulation/gait training, DME/adaptive equipment instruction, Neuromuscular re-education,  Community reintegration, IT trainer, UE/LE Strength taining/ROM, Wheelchair propulsion/positioning, Training and development officer, Discharge planning, Therapeutic Activities, UE/LE Coordination activities, Functional mobility training, Patient/family education, Therapeutic Exercise, Splinting/orthotics, Pain management  OT Interventions Training and development officer, Community reintegration, Discharge planning, Functional mobility training, Pain management, Patient/family education, Self Care/advanced ADL retraining, Skin care/wound managment, Therapeutic Activities, Therapeutic Exercise, Wheelchair propulsion/positioning, Disease mangement/prevention, DME/adaptive equipment instruction, Psychosocial support, UE/LE Strength taining/ROM  SLP Interventions    TR Interventions    SW/CM Interventions      Team Discharge Planning: Destination: PT-Home ,OT- Home , SLP-  Projected Follow-up: PT-Home health PT, OT-  None, SLP-  Projected Equipment Needs: PT-Wheelchair (measurements), OT- To be determined, SLP-  Equipment Details: PT-18x18, basic cushion, OT-  Patient/family involved in discharge planning: PT- Patient,  OT-Patient, Family member/caregiver, SLP-   MD ELOS: 3-5 days. Medical Rehab Prognosis:  Excellent Assessment: 65 y.o. right handed male with history of remote tobacco abuse, hypertension, diabetes mellitus and peripheral neuropathy, peripheral vascular disease status post right femoral-popliteal artery bypass 11/28/2015 and gangrenous right great toe status post right great toe amputation 12/01/2015 and chronic pain maintained on oxycodone 15 mg every 4 hours as needed. Presented 12/15/2015 with severe right foot pain and poor healing of recent right great toe amputation with profound ischemic changes. Limb was not felt to be salvageable and underwent right below-knee amputation 12/16/2015 per Dr. Donnetta Hutching. Hospital course pain management with pharmacy consulted for pain control and wean from  PCA placed on Oxycodone 30mg  every 4 hour,neurontin and fentanyl patch. Patient was admitted for a comprehensive rehabilitation program 12/20/2015. Patient progressing nicely with therapies. Follow-up vascular surgery noting progressive ischemic changes nonhealing right BKA site felt AKA was needed. Patient was discharged to the services of vascular surgery 12/26/2015 and underwent right AKA for Dr. Donnetta Hutching. Hospital course ongoing pain management. Acute blood loss anemia 10.9 and monitored. Doing significantly better at present  See Team Conference Notes for weekly updates to the plan of care

## 2015-12-31 NOTE — Discharge Summary (Signed)
Discharge summary job # [id number]

## 2016-01-01 ENCOUNTER — Inpatient Hospital Stay (HOSPITAL_COMMUNITY): Payer: No Typology Code available for payment source | Admitting: Occupational Therapy

## 2016-01-01 ENCOUNTER — Inpatient Hospital Stay (HOSPITAL_COMMUNITY): Payer: No Typology Code available for payment source | Admitting: Physical Therapy

## 2016-01-01 DIAGNOSIS — M792 Neuralgia and neuritis, unspecified: Secondary | ICD-10-CM | POA: Insufficient documentation

## 2016-01-01 MED ORDER — PANTOPRAZOLE SODIUM 40 MG PO TBEC: 40.0000 mg | DELAYED_RELEASE_TABLET | Freq: Every day | ORAL | Status: DC

## 2016-01-01 MED ORDER — FENTANYL 25 MCG/HR TD PT72: 25.0000 ug | MEDICATED_PATCH | TRANSDERMAL | Status: DC

## 2016-01-01 MED ORDER — FENTANYL 25 MCG/HR TD PT72
25.0000 ug | MEDICATED_PATCH | TRANSDERMAL | Status: DC
  Administered 2016-01-01: 25 ug via TRANSDERMAL
  Filled 2016-01-01: qty 1

## 2016-01-01 MED ORDER — CELECOXIB 200 MG PO CAPS: 200.0000 mg | ORAL_CAPSULE | Freq: Two times a day (BID) | ORAL | Status: DC

## 2016-01-01 MED ORDER — OXYCODONE HCL 5 MG PO TABS: 5.0000 mg | ORAL_TABLET | ORAL | Status: DC | PRN

## 2016-01-01 MED ORDER — GABAPENTIN 600 MG PO TABS: 300.0000 mg | ORAL_TABLET | Freq: Every day | ORAL | Status: DC

## 2016-01-01 MED ORDER — ATORVASTATIN CALCIUM 10 MG PO TABS: 10.0000 mg | ORAL_TABLET | Freq: Every day | ORAL | Status: DC

## 2016-01-01 NOTE — Progress Notes (Signed)
Patient advised regarding Flu and Pneumonia vaccines and why h e should be vaccinated.  Patient declined both vaccines.

## 2016-01-01 NOTE — Progress Notes (Signed)
Occupational Therapy Discharge Summary  Patient Details  Name: Adam [name] MRN: [id number] Date of Birth: [birth year]  Patient has met 8 of 8 long term goals due to improved activity tolerance, ability to compensate for deficits and improved awareness.  Patient to discharge at overall Modified Independent level, Supervision with walk-in shower transfer.  Patient's care partner is independent to provide the necessary intermittent supervision assistance at discharge.    Reasons goals not met: N/A  Recommendation:  Patient will not require follow up OT at this time.  Is to have home health PT.  Equipment: 3 in 1  Reasons for discharge: treatment goals met and discharge from hospital  Patient/family agrees with progress made and goals achieved: Yes  OT Discharge Precautions/Restrictions  Precautions Precautions: Fall Precaution Comments: R AKA General OT Amount of Missed Time: 18 Minutes Vital Signs  Pain Pain Assessment Pain Assessment: No/denies pain Pain Score: 4  ADL  See Function Navigator Vision/Perception  Vision- History Baseline Vision/History: Wears glasses Wears Glasses: Distance only Patient Visual Report: No change from baseline Vision- Assessment Vision Assessment?: No apparent visual deficits  Cognition Overall Cognitive Status: Within Functional Limits for tasks assessed Arousal/Alertness: Awake/alert Orientation Level: Oriented X4 Selective Attention: Appears intact Memory: Impaired Memory Impairment: Decreased recall of new information Awareness: Impaired Awareness Impairment: Anticipatory impairment Problem Solving: Impaired Problem Solving Impairment: Functional complex Safety/Judgment: Appears intact Sensation Sensation Light Touch: Appears Intact Coordination Gross Motor Movements are Fluid and Coordinated: Yes Fine Motor Movements are Fluid and Coordinated: Yes Finger Nose Finger Test: [facility] Motor  Motor Motor: Within Functional  Limits Mobility  Bed Mobility Bed Mobility: Supine to Sit;Sit to Supine Supine to Sit: 6: Modified independent (Device/Increase time) Sit to Supine: 6: Modified independent (Device/Increase time) Transfers Sit to Stand: 6: Modified independent (Device/Increase time) Stand to Sit: 6: Modified independent (Device/Increase time)  Trunk/Postural Assessment  Cervical Assessment Cervical Assessment: Within Functional Limits Thoracic Assessment Thoracic Assessment: Within Functional Limits Lumbar Assessment Lumbar Assessment: Within Functional Limits Postural Control Postural Control: Within Functional Limits  Balance Balance Balance Assessed: Yes Dynamic Sitting Balance Dynamic Sitting - Balance Support: No upper extremity supported Dynamic Sitting - Level of Assistance: 6: Modified independent (Device/Increase time) Dynamic Sitting - Balance Activities: Lateral lean/weight shifting;Forward lean/weight shifting;Reaching across midline Static Standing Balance Static Standing - Balance Support: Left upper extremity supported;Right upper extremity supported Static Standing - Level of Assistance: 6: Modified independent (Device/Increase time) Dynamic Standing Balance Dynamic Standing - Balance Support: Right upper extremity supported;Left upper extremity supported Dynamic Standing - Level of Assistance: 5: Stand by assistance Dynamic Standing - Balance Activities: Forward lean/weight shifting;Lateral lean/weight shifting;Reaching across midline Extremity/Trunk Assessment RUE Assessment RUE Assessment: Within Functional Limits LUE Assessment LUE Assessment: Within Functional Limits   See Function Navigator for Current Functional Status.  [name] 01/01/2016, 12:00 PM

## 2016-01-01 NOTE — Progress Notes (Signed)
Occupational Therapy Session Note  Patient Details  Name: Adam [name] MRN: [id number] Date of Birth: [birth year]  Today's Date: 01/01/2016 OT Individual Time: 1100-1142 OT Individual Time Calculation (min): 42 min    Short Term Goals: Week 1:  OT Short Term Goal 1 (Week 1): STG's = LTG's secondary to short ELOS  Skilled Therapeutic Interventions/Progress Updates:    Treatment session with focus on completing pt and family education in preparation to d/c home.  Pt reports already washing and dressing prior to session at w/c level at sink without assist.  Completed toilet transfers with ambulation in/out of room bathroom with RW at overall Mod I level.  Completed simulated walk-in shower transfers with placing RW over shower ledge and then controlled hopping over shower ledge with supervision.  Discussed pain management and desensitization strategies with flagging section in "First Steps" publication.  Pt's wife arrived at end of session, reports ready to d/c and no further questions for therapy but question regarding setup of home health.  Notified SWK and PA of pt ready to d/c.  Therapy Documentation Precautions:  Precautions Precautions: Fall Precaution Comments: R AKA Restrictions Weight Bearing Restrictions: Yes RLE Weight Bearing: Non weight bearing General: General OT Amount of Missed Time: 18 Minutes Vital Signs:  Pain: Pain Assessment Pain Assessment: No/denies pain Pain Score: 4   See Function Navigator for Current Functional Status.   Therapy/Group: Individual Therapy  [name] 01/01/2016, 11:50 AM

## 2016-01-01 NOTE — Progress Notes (Signed)
Physical Therapy Discharge Summary  Patient Details  Name: Adam [name] MRN: [id number] Date of Birth: 1951/03/03  Today's Date: 01/01/2016 PT Individual Time: 0900-1003 PT Individual Time Calculation (min): 63 min    Patient has met 8 of 9 long term goals due to improved activity tolerance, improved balance, improved postural control and decreased pain.  Patient to discharge at a wheelchair level [place], ambulatory with RW and supervision.   Patient's care partner is independent to provide the necessary physical assistance at discharge.  Reasons goals not met: Pt continues to require mod assist on stairs due to balance when simulating home entry with 1 rail.  Pt reports that rail is not set up at home, attempted trial of steps without rail using crutch and HHA, RW, and scooting up on bottom but each trial either unsafe or required max assist from therapist and wife not present to teach new method.  PT provided pt with handout on w/c bumping for stair negotiation and provided hand written notes for clarification.  Reviewed handout with wife prior to discharge and she states she has no further questions.  Recommendation:  Patient will benefit from ongoing skilled PT services in home health setting to continue to advance safe functional mobility, address ongoing impairments in strength, balance, and activity tolerance, and minimize fall risk.  Equipment: w/c and RW  Reasons for discharge: treatment goals met  Patient/family agrees with progress made and goals achieved: Yes   Skilled PT Intervention: Pt received resting in w/c and agreeable to therapy session.  Session focus on reassessment of balance, gait, transfers, w/c mobility, and strength.  Pt transfers mod I with RW, car transfers supervision, amb with supervision and RW, and pt is mod I in w/c.  Pt reports that rail has not been installed for 2 step home entry.  Problem solved ways to enter home with pt and trialed 2  steps with crutch on R and HHA on L with max assist, crutch on R and rail on L with mod assist, and transferring from floor to w/c after simulated scooting up steps on bottom (max assist).  PT provided pt with handout on w/c bumping and reviewed with wife at end of session.  Pt returned to room at end of session and left with call bell in reach and needs met.  PT Discharge Precautions/Restrictions Restrictions Weight Bearing Restrictions: Yes RLE Weight Bearing: Non weight bearing Pain Pain Assessment Pain Assessment: No/denies pain Pain Score: 4   Cognition Overall Cognitive Status: Within Functional Limits for tasks assessed Arousal/Alertness: Awake/alert Orientation Level: Oriented X4 Selective Attention: Appears intact Memory: Impaired Memory Impairment: Decreased recall of new information Awareness: Impaired Awareness Impairment: Anticipatory impairment Problem Solving: Impaired Problem Solving Impairment: Functional complex Sensation Sensation Light Touch: Appears Intact Coordination Gross Motor Movements are Fluid and Coordinated: Yes Fine Motor Movements are Fluid and Coordinated: Yes Motor  Motor Motor: Within Functional Limits  Mobility Bed Mobility Bed Mobility: Supine to Sit;Sit to Supine Supine to Sit: 6: Modified independent (Device/Increase time) Sit to Supine: 6: Modified independent (Device/Increase time) Transfers Transfers: Yes Sit to Stand: 6: Modified independent (Device/Increase time) Stand to Sit: 6: Modified independent (Device/Increase time) Squat Pivot Transfers: 6: Modified independent (Device/Increase time) Locomotion  Ambulation Ambulation: Yes Ambulation/Gait Assistance: 5: Supervision Ambulation Distance (Feet): 60 Feet Assistive device: Rolling walker Gait Gait: Yes Gait Pattern: Decreased step length - left;Trunk flexed Stairs / Additional Locomotion Stairs: Yes Stairs Assistance: 3: Mod assist Stairs Assistance Details: Verbal  cues for  sequencing;Verbal cues for technique Stair Management Technique: One rail Right;One rail Left;With crutches Number of Stairs: 4 Wheelchair Mobility Wheelchair Mobility: Yes Wheelchair Assistance: 6: Modified independent (Device/Increase time) Environmental health practitioner: Both upper extremities Wheelchair Parts Management: Supervision/cueing;Independent Distance: 150  Trunk/Postural Assessment  Cervical Assessment Cervical Assessment: Within Functional Limits Thoracic Assessment Thoracic Assessment: Within Functional Limits Lumbar Assessment Lumbar Assessment: Within Functional Limits Postural Control Postural Control: Within Functional Limits  Balance Balance Balance Assessed: Yes Dynamic Sitting Balance Dynamic Sitting - Balance Support: No upper extremity supported Dynamic Sitting - Level of Assistance: 6: Modified independent (Device/Increase time) Dynamic Sitting - Balance Activities: Lateral lean/weight shifting;Forward lean/weight shifting;Reaching across midline Static Standing Balance Static Standing - Balance Support: Left upper extremity supported;Right upper extremity supported Static Standing - Level of Assistance: 6: Modified independent (Device/Increase time) Dynamic Standing Balance Dynamic Standing - Balance Support: Right upper extremity supported;Left upper extremity supported Dynamic Standing - Level of Assistance: 5: Stand by assistance Dynamic Standing - Balance Activities: Forward lean/weight shifting;Lateral lean/weight shifting;Reaching across midline Extremity Assessment      RLE Assessment RLE Assessment:  (hip flexion AROM WFL, painful with resistance) LLE Assessment LLE Assessment: Within Functional Limits LLE Strength LLE Overall Strength Comments: hip flexion 4/5, knee extension 4/5, knee flxion 3+/5, DF/PF 3+/5   See Function Navigator for Current Functional Status.  Adam [name] 01/01/2016, 9:46 AM

## 2016-01-01 NOTE — Progress Notes (Signed)
Patient discharged to home, accompanied by his wife. 

## 2016-01-01 NOTE — Progress Notes (Signed)
[facility] PHYSICAL MEDICINE & REHABILITATION     PROGRESS NOTE  Subjective/Complaints:  Pt sitting up in bed.  He is very anxious to go home.    ROS: +Mild stump pain, phantom limb pain. Denies CP, SOB, n/v/d.  Objective: Vital Signs: Blood pressure 132/70, pulse 56, temperature 98.6 F (37 C), temperature source Oral, resp. rate 18, height 5\' 11"  (1.803 m), weight 79.017 kg (174 lb 3.2 oz), SpO2 98 %. No results found.  Recent Labs  12/31/15 0520  WBC 8.7  HGB 10.9*  HCT 33.7*  PLT 522*    Recent Labs  12/31/15 0520  NA 140  K 4.2  CL 102  GLUCOSE 102*  BUN 16  CREATININE 0.64  CALCIUM 9.2   CBG (last 3)  No results for input(s): GLUCAP in the last 72 hours.  Wt Readings from Last 3 Encounters:  12/29/15 79.017 kg (174 lb 3.2 oz)  12/20/15 80.604 kg (177 lb 11.2 oz)  12/15/15 81.4 kg (179 lb 7.3 oz)    Physical Exam:  BP 132/70 mmHg  Pulse 56  Temp(Src) 98.6 F (37 C) (Oral)  Resp 18  Ht 5\' 11"  (1.803 m)  Wt 79.017 kg (174 lb 3.2 oz)  BMI 24.31 kg/m2  SpO2 98% Constitutional: He appears well-developed and well-nourished. NAD.  Vital signs reviewed. HENT: Normocephalic and atraumatic.  Eyes: Conjunctivae and EOM are normal.  Cardiovascular: Normal rate and regular rhythm.  Respiratory: Effort normal and breath sounds normal. No respiratory distress.  GI: Soft. Bowel sounds are normal. He exhibits no distension.  Musculoskeletal: He exhibits mild edema and minimal tenderness.  Neurological: He is alert and oriented. Motor: B/l UE, LLE 5/5 RLE: hip flexion 4+/5 (pain inhibition)  Skin: Skin is warm and dry.  AKA with staples c/d/i. Psychiatric: He has a normal mood and affect. His behavior is normal. Thought content normal.   Assessment/Plan: 1. Functional deficits secondary to right AKA after failed BKA which require 3+ hours per day of interdisciplinary therapy in a comprehensive inpatient rehab setting. Physiatrist is providing close team  supervision and 24 hour management of active medical problems listed below. Physiatrist and rehab team continue to assess barriers to discharge/monitor patient progress toward functional and medical goals.  Function:  Bathing Bathing position Bathing activity did not occur: Refused Position: Wheelchair/chair at sink  Bathing parts Body parts bathed by patient: Right arm, Left arm, Chest, Abdomen, Front perineal area, Right upper leg, Left upper leg, Left lower leg Body parts bathed by helper: Buttocks, Back  Bathing assist Assist Level: Touching or steadying assistance(Pt > 75%)      Upper Body Dressing/Undressing Upper body dressing   What is the patient wearing?: Pull over shirt/dress     Pull over shirt/dress - Perfomed by patient: Thread/unthread right sleeve, Thread/unthread left sleeve, Put head through opening, Pull shirt over trunk          Upper body assist Assist Level: Set up   Set up : To obtain clothing/put away  Lower Body Dressing/Undressing Lower body dressing   What is the patient wearing?: Pants, Socks, Underwear Underwear - Performed by patient: Thread/unthread left underwear leg, Thread/unthread right underwear leg, Pull underwear up/down Underwear - Performed by helper: Pull underwear up/down Pants- Performed by patient: Thread/unthread right pants leg, Thread/unthread left pants leg, Pull pants up/down, Fasten/unfasten pants Pants- Performed by helper: Pull pants up/down     Socks - Performed by patient: Don/doff left sock Socks - Performed by helper: Don/doff left sock  Lower body assist Assist for lower body dressing: Touching or steadying assistance (Pt > 75%)      Toileting Toileting   Toileting steps completed by patient: Adjust clothing prior to toileting, Performs perineal hygiene, Adjust clothing after toileting Toileting steps completed by helper: Performs perineal hygiene, Adjust clothing after toileting Toileting Assistive  Devices: Grab bar or rail  Toileting assist Assist level: Touching or steadying assistance (Pt.75%) (while standing for urination)   Transfers Chair/bed transfer   Chair/bed transfer method: Stand pivot Chair/bed transfer assist level: No Help, no cues, assistive device, takes more than a reasonable amount of time Chair/bed transfer assistive device: Armrests, Medical sales representative     Max distance: 100 Assist level: Supervision or verbal cues   Wheelchair   Type: Manual Max wheelchair distance: 200 Assist Level: No help, No cues, assistive device, takes more than reasonable amount of time  Cognition Comprehension Comprehension assist level: Follows basic conversation/direction with extra time/assistive device  Expression Expression assist level: Expresses complex 90% of the time/cues < 10% of the time  Social Interaction Social Interaction assist level: Interacts appropriately with others - No medications needed.  Problem Solving Problem solving assist level: Solves complex problems: With extra time  Memory Memory assist level: Recognizes or recalls 90% of the time/requires cueing < 10% of the time    Medical Problem List and Plan: 1. Right AKA 12/26/2015 after failed BKA secondary to progressive ischemic changes with peripheral vascular disease  D/C today 2. DVT Prophylaxis/Anticoagulation: Subcutaneous Lovenox. Monitor platelet counts and any signs of bleeding 3. Pain Management: Celebrex 200 mg twice a day, fentanyl patch 50 g change every 72 hours, decreased to 25 on 2/13, oxycodone 15 decreased to 5 mg every 4 hours and Flexeril as needed.  Much better controlled than prior to AKA  Will add Gabapentin for Neuropathic pain 300qhs 4. Acute blood loss anemia.    hemoglobin 10.9 on 2/13 5. Neuropsych: This patient is capable of making decisions on his own behalf. 6. Skin/Wound Care: Routine skin checks 7. Fluids/Electrolytes/Nutrition: Routine I&O    BMP within  normal limits on 2/13 8. Hypertension. Lisinopril 20 mg daily, HCTZ 12.5 mg daily. Monitor with increased mobility  Stable at present 9. Hyperlipidemia. Lipitor 10. Diet-controlled diabetes mellitus. Hemoglobin A1c 5.7. Blood sugar checks discontinued averaging 93-105. 11. Transaminitis  LFTs improving  LOS (Days) 3 A FACE TO FACE EVALUATION WAS PERFORMED  Jani Ploeger Lorie Phenix 01/01/2016 8:59 AM

## 2016-01-01 NOTE — Patient Care Conference (Signed)
Inpatient RehabilitationTeam Conference and Plan of Care Update Date: 01/01/2016   Time: 1:14 PM    Patient Name: Adam [name]      Medical Record Number: [id number]  Date of Birth: 1951/01/22 Sex: Male         Room/Bed: 4W25C/4W25C-01 Payor Info: Payor: Theme park manager / Plan: [place] / Product Type: *No Product type* /    Admitting Diagnosis: AKA  Admit Date/Time:  12/29/2015  2:14 PM Admission Comments: No comment available   Primary Diagnosis:  <principal problem not specified> Principal Problem: <principal problem not specified>  Patient Active Problem List   Diagnosis Date Noted  . Neuropathic pain   . PVD (peripheral vascular disease) ([place])   . Benign essential HTN   . HLD (hyperlipidemia)   . Type 2 diabetes mellitus with diabetic peripheral angiopathy and gangrene, without long-term current use of insulin ([place])   . Above knee amputation of right lower extremity ([place]) 12/29/2015  . Constipation due to pain medication   . Bleeding   . Hyponatremia   . Leukocytosis   . Transaminitis   . Chronic pain syndrome   . Pressure ulcer 12/20/2015  . Vascular insufficiency   . Abnormality of gait   . Status post below knee amputation of right lower extremity ([place])   . DM type 2 with diabetic peripheral neuropathy ([place])   . Essential hypertension   . SIRS (systemic inflammatory response syndrome) ([facility])   . Post-operative pain   . Acute blood loss anemia   . PAD (peripheral artery disease) ([place]) 12/15/2015  . Peripheral vascular disease, unspecified ([place]) 12/07/2015  . Hyperlipidemia associated with type 2 diabetes mellitus ([place]) 12/07/2015  . Great toe amputation status ([place]) 12/07/2015  . Atherosclerosis of native arteries of left leg with ulceration of unspecified site ([place]) 11/28/2015  . Atherosclerosis of native arteries of the extremities with ulceration ([place]) 11/26/2015  . Atherosclerosis of native arteries of the extremities with gangrene ([place]) 11/23/2015  .  New onset type 2 diabetes mellitus ([place]) 11/22/2015  . Hypertension associated with diabetes ([place]) 12/04/2014    Expected Discharge Date: Expected Discharge Date: 01/01/16  Team Members Present: Physician leading conference: Dr. Delice Lesch Social Worker Present: Lennart Pall, LCSW Nurse Present: Rozetta Nunnery, RN PT Present: Carney Living, PT OT Present: Other (comment) Chrys Racer, OT)     Current Status/Progress Goal Weekly Team Focus  Medical   Right AKA 12/26/2015 after failed BKA secondary to progressive ischemic changes with peripheral vascular disease  Pre-prosthetic edu, improve gait, balance, mobility, pain  see above   Bowel/Bladder   continent of bowel and bladder  remain continent of bowel and bladder  monitor for s/s of constipation   Swallow/Nutrition/ Hydration             ADL's   supervision overall, min guard for shower stall transfers - pt fastly approaching mod I overall and supervision for shower stall transfers   Mod I overall, supervision for shower stall transfers  d/c planning, dynamic standing balance, functional transfers, family education   Mobility   supervision amb, mod I w/c, mod I transfers  supervision amb, mod I w/c, mod I transfers  d/c Tuesday   Communication             Safety/Cognition/ Behavioral Observations            Pain   pain to R AKA site  pain equal to or less than 4 on a scale of 0-10  assess  pain q4h, treat per order   Skin   R AKA site clean and dry, staples patent, MASD to bilateral buttocks  no new skin injury/breakdown while on Rehab  assess skin q shift, keep skin clean and ery, encourage patient to turn side to side    Rehab Goals Patient on target to meet rehab goals: Yes *See Care Plan and progress notes for long and short-term goals.  Barriers to Discharge: Pain in RLE, ABLA    Possible Resolutions to Barriers:  Optimize pain meds to reduce pain and limit side effects    Discharge Planning/Teaching Needs:   home with wife who can provide assistance as needed.  completed with wife 12/31/15   Team Discussion:  Pt has met all goals and ready for d/c home today.  Revisions to Treatment Plan:  None   Continued Need for Acute Rehabilitation Level of Care: The patient requires daily medical management by a physician with specialized training in physical medicine and rehabilitation for the following conditions: Daily direction of a multidisciplinary physical rehabilitation program to ensure safe treatment while eliciting the highest outcome that is of practical value to the patient.: Yes Daily medical management of patient stability for increased activity during participation in an intensive rehabilitation regime.: Yes Daily analysis of laboratory values and/or radiology reports with any subsequent need for medication adjustment of medical intervention for : Post surgical problems;Wound care problems  Jamal Pavon 01/01/2016, 1:14 PM

## 2016-01-01 NOTE — Plan of Care (Signed)
Problem: RH Stairs Goal: LTG Patient will ambulate up and down stairs w/assist (PT) LTG: Patient will ambulate up and down # of stairs with assistance (PT)  Outcome: Not Met (add Reason) Pt requiring min assist to ascend with crutch and 1 rail and mod assist to descend with crutch and 1 rail. Please see d/c note for further details.

## 2016-01-01 NOTE — Progress Notes (Signed)
Social Work  Discharge Note  The overall goal for the admission was met for:   Discharge location: Yes - home with wife who can provide needed assistance  Length of Stay: Yes - 3 days  Discharge activity level: Yes - modified independent overall  Home/community participation: Yes  Services provided included: MD, RD, PT, OT, RN, TR, Pharmacy and SW  Financial Services: Private Insurance: [employer]  Follow-up services arranged: Home Health: RN, PT via [facility], DME: 18x18 lightweight w/c with R amp support pad, basic cushion, 3n1 commode via [facility] and Patient/Family has no preference for HH/DME agencies  Comments (or additional information):  Patient/Family verbalized understanding of follow-up arrangements: Yes  Individual responsible for coordination of the follow-up plan: pt  Confirmed correct DME delivered: Rece Zechman 01/01/2016    Etta Gassett

## 2016-01-02 ENCOUNTER — Telehealth: Payer: Self-pay | Admitting: Family Medicine

## 2016-01-02 NOTE — Telephone Encounter (Signed)
,   I will work with him. Left him know that if they need anything to call me

## 2016-01-02 NOTE — Telephone Encounter (Signed)
Left message for pt to call/ needs an appt per [facility] next week. Has two appts on the books for March 2 and 3, so we need to fix that as well.

## 2016-01-02 NOTE — Telephone Encounter (Signed)
Pt's wife called me back and stated that pt is doing great. She states they are having to make some adjustments at home but they are working on that. We went over all his meds. And she has a good understanding of what he is taking. Meds are as follows:  Aspirin 81 mg  Lipitor 10 mg every other day (was receiving daily in hospital) Celebrex twice a day Lisinopril/HCTZ  Multivitamin Gabapentin 600 mg taking 1/2 at night  Fentanyl 25 mg every 72 hours Oxycodone 5 mg every 4 hours.   I informed wife that Kendrick Fries would like to see pt and she stated that she really didn't want to bring him out until stitches were removed.  We did discuss that fact that he had two appts on the books. She stated she would bring him in on the 01/18/2016. I told pt's wife that I would discuss with JCL if that would be ok or if he really needed to see him earlier. PLEASE ADVISE if ok for pt to wait until 01/18/2016 or if needs to be sooner. Pt's wife can be reached at [phone].

## 2016-01-04 ENCOUNTER — Encounter: Payer: Self-pay | Admitting: Vascular Surgery

## 2016-01-04 DIAGNOSIS — E119 Type 2 diabetes mellitus without complications: Secondary | ICD-10-CM | POA: Diagnosis not present

## 2016-01-04 DIAGNOSIS — I739 Peripheral vascular disease, unspecified: Secondary | ICD-10-CM | POA: Diagnosis not present

## 2016-01-04 DIAGNOSIS — E785 Hyperlipidemia, unspecified: Secondary | ICD-10-CM | POA: Diagnosis not present

## 2016-01-04 DIAGNOSIS — Z4781 Encounter for orthopedic aftercare following surgical amputation: Secondary | ICD-10-CM | POA: Diagnosis not present

## 2016-01-04 DIAGNOSIS — G4733 Obstructive sleep apnea (adult) (pediatric): Secondary | ICD-10-CM | POA: Diagnosis not present

## 2016-01-04 DIAGNOSIS — Z7982 Long term (current) use of aspirin: Secondary | ICD-10-CM | POA: Diagnosis not present

## 2016-01-04 DIAGNOSIS — I10 Essential (primary) hypertension: Secondary | ICD-10-CM | POA: Diagnosis not present

## 2016-01-04 DIAGNOSIS — Z89611 Acquired absence of right leg above knee: Secondary | ICD-10-CM | POA: Diagnosis not present

## 2016-01-04 MED ORDER — ESCITALOPRAM OXALATE 10 MG PO TABS: 10.0000 mg | ORAL_TABLET | Freq: Every day | ORAL | Status: DC

## 2016-01-04 MED ORDER — GABAPENTIN 600 MG PO TABS: 300.0000 mg | ORAL_TABLET | Freq: Every day | ORAL | Status: DC

## 2016-01-04 NOTE — Telephone Encounter (Signed)
Gave verbal orders. Sent in lexapro. Refilled gabapentin. They are trying to get him into counseling and into an amputee support group.

## 2016-01-07 ENCOUNTER — Telehealth: Payer: Self-pay | Admitting: Family Medicine

## 2016-01-07 DIAGNOSIS — E119 Type 2 diabetes mellitus without complications: Secondary | ICD-10-CM | POA: Diagnosis not present

## 2016-01-07 DIAGNOSIS — Z89611 Acquired absence of right leg above knee: Secondary | ICD-10-CM | POA: Diagnosis not present

## 2016-01-07 DIAGNOSIS — I10 Essential (primary) hypertension: Secondary | ICD-10-CM | POA: Diagnosis not present

## 2016-01-07 DIAGNOSIS — Z4781 Encounter for orthopedic aftercare following surgical amputation: Secondary | ICD-10-CM | POA: Diagnosis not present

## 2016-01-07 DIAGNOSIS — E785 Hyperlipidemia, unspecified: Secondary | ICD-10-CM | POA: Diagnosis not present

## 2016-01-07 DIAGNOSIS — I739 Peripheral vascular disease, unspecified: Secondary | ICD-10-CM | POA: Diagnosis not present

## 2016-01-07 NOTE — Telephone Encounter (Signed)
Gabapentin was printed, so I called into pharmacy per [facility]

## 2016-01-09 DIAGNOSIS — Z4781 Encounter for orthopedic aftercare following surgical amputation: Secondary | ICD-10-CM | POA: Diagnosis not present

## 2016-01-09 DIAGNOSIS — I739 Peripheral vascular disease, unspecified: Secondary | ICD-10-CM | POA: Diagnosis not present

## 2016-01-09 DIAGNOSIS — E785 Hyperlipidemia, unspecified: Secondary | ICD-10-CM | POA: Diagnosis not present

## 2016-01-09 DIAGNOSIS — Z89611 Acquired absence of right leg above knee: Secondary | ICD-10-CM | POA: Diagnosis not present

## 2016-01-09 DIAGNOSIS — E119 Type 2 diabetes mellitus without complications: Secondary | ICD-10-CM | POA: Diagnosis not present

## 2016-01-09 DIAGNOSIS — I10 Essential (primary) hypertension: Secondary | ICD-10-CM | POA: Diagnosis not present

## 2016-01-10 ENCOUNTER — Encounter: Payer: Self-pay | Admitting: *Deleted

## 2016-01-11 DIAGNOSIS — Z4781 Encounter for orthopedic aftercare following surgical amputation: Secondary | ICD-10-CM | POA: Diagnosis not present

## 2016-01-11 DIAGNOSIS — I739 Peripheral vascular disease, unspecified: Secondary | ICD-10-CM | POA: Diagnosis not present

## 2016-01-11 DIAGNOSIS — I10 Essential (primary) hypertension: Secondary | ICD-10-CM | POA: Diagnosis not present

## 2016-01-11 DIAGNOSIS — E785 Hyperlipidemia, unspecified: Secondary | ICD-10-CM | POA: Diagnosis not present

## 2016-01-11 DIAGNOSIS — Z89611 Acquired absence of right leg above knee: Secondary | ICD-10-CM | POA: Diagnosis not present

## 2016-01-11 DIAGNOSIS — E119 Type 2 diabetes mellitus without complications: Secondary | ICD-10-CM | POA: Diagnosis not present

## 2016-01-14 DIAGNOSIS — I10 Essential (primary) hypertension: Secondary | ICD-10-CM | POA: Diagnosis not present

## 2016-01-14 DIAGNOSIS — Z89611 Acquired absence of right leg above knee: Secondary | ICD-10-CM | POA: Diagnosis not present

## 2016-01-14 DIAGNOSIS — I739 Peripheral vascular disease, unspecified: Secondary | ICD-10-CM | POA: Diagnosis not present

## 2016-01-14 DIAGNOSIS — E119 Type 2 diabetes mellitus without complications: Secondary | ICD-10-CM | POA: Diagnosis not present

## 2016-01-14 DIAGNOSIS — Z4781 Encounter for orthopedic aftercare following surgical amputation: Secondary | ICD-10-CM | POA: Diagnosis not present

## 2016-01-14 DIAGNOSIS — E785 Hyperlipidemia, unspecified: Secondary | ICD-10-CM | POA: Diagnosis not present

## 2016-01-15 ENCOUNTER — Ambulatory Visit (INDEPENDENT_AMBULATORY_CARE_PROVIDER_SITE_OTHER): Payer: Self-pay | Admitting: Vascular Surgery

## 2016-01-15 ENCOUNTER — Encounter: Payer: Self-pay | Admitting: Vascular Surgery

## 2016-01-15 VITALS — BP 147/87 | HR 72 | Temp 97.4°F | Resp 16 | Ht 71.0 in

## 2016-01-15 DIAGNOSIS — Z89611 Acquired absence of right leg above knee: Secondary | ICD-10-CM

## 2016-01-15 NOTE — Progress Notes (Signed)
Filed Vitals:   01/15/16 0949 01/15/16 0952  BP: 169/79 147/87  Pulse: 72 72  Temp: 97.4 F (36.3 C)   Resp: 16   Height: 5\' 11"  (1.803 m)   SpO2: 98%

## 2016-01-15 NOTE — Progress Notes (Signed)
Today for followup of right above-knee amputation. Very complicated course. He underwent initially right femoral to popliteal bypass by Dr. Bridgett Larsson. This subsequently occluded and he had nonhealing coarctation presenting with severe pain. He underwent a below knee amputation with a borderline flow. This failed as well and he subsequently underwent above-knee amputation. He has done well since discharge from rehabilitation. He is in good spirits today and is here today with his wife. He is to walk with a walker. His amputation site is healing quite nicely. Staples were removed today. He is referred to [facility] for stump shrinking and eventual prosthetic fitting.he will see [place] again on an as-needed basis

## 2016-01-16 DIAGNOSIS — E119 Type 2 diabetes mellitus without complications: Secondary | ICD-10-CM | POA: Diagnosis not present

## 2016-01-16 DIAGNOSIS — Z89611 Acquired absence of right leg above knee: Secondary | ICD-10-CM | POA: Diagnosis not present

## 2016-01-16 DIAGNOSIS — Z4781 Encounter for orthopedic aftercare following surgical amputation: Secondary | ICD-10-CM | POA: Diagnosis not present

## 2016-01-16 DIAGNOSIS — E785 Hyperlipidemia, unspecified: Secondary | ICD-10-CM | POA: Diagnosis not present

## 2016-01-16 DIAGNOSIS — I10 Essential (primary) hypertension: Secondary | ICD-10-CM | POA: Diagnosis not present

## 2016-01-16 DIAGNOSIS — I739 Peripheral vascular disease, unspecified: Secondary | ICD-10-CM | POA: Diagnosis not present

## 2016-01-17 ENCOUNTER — Inpatient Hospital Stay: Payer: PRIVATE HEALTH INSURANCE | Admitting: Family Medicine

## 2016-01-18 ENCOUNTER — Ambulatory Visit (INDEPENDENT_AMBULATORY_CARE_PROVIDER_SITE_OTHER): Payer: Medicare Other | Admitting: Family Medicine

## 2016-01-18 ENCOUNTER — Encounter: Payer: Self-pay | Admitting: Family Medicine

## 2016-01-18 VITALS — BP 128/80 | HR 73 | Wt 163.0 lb

## 2016-01-18 DIAGNOSIS — S78111A Complete traumatic amputation at level between right hip and knee, initial encounter: Secondary | ICD-10-CM

## 2016-01-18 DIAGNOSIS — G546 Phantom limb syndrome with pain: Secondary | ICD-10-CM | POA: Diagnosis not present

## 2016-01-18 DIAGNOSIS — E1169 Type 2 diabetes mellitus with other specified complication: Secondary | ICD-10-CM

## 2016-01-18 DIAGNOSIS — I10 Essential (primary) hypertension: Secondary | ICD-10-CM

## 2016-01-18 DIAGNOSIS — Z125 Encounter for screening for malignant neoplasm of prostate: Secondary | ICD-10-CM

## 2016-01-18 DIAGNOSIS — I739 Peripheral vascular disease, unspecified: Secondary | ICD-10-CM | POA: Diagnosis not present

## 2016-01-18 DIAGNOSIS — E1159 Type 2 diabetes mellitus with other circulatory complications: Secondary | ICD-10-CM

## 2016-01-18 DIAGNOSIS — Z4781 Encounter for orthopedic aftercare following surgical amputation: Secondary | ICD-10-CM | POA: Diagnosis not present

## 2016-01-18 DIAGNOSIS — I70261 Atherosclerosis of native arteries of extremities with gangrene, right leg: Secondary | ICD-10-CM

## 2016-01-18 DIAGNOSIS — Z89611 Acquired absence of right leg above knee: Secondary | ICD-10-CM

## 2016-01-18 DIAGNOSIS — F329 Major depressive disorder, single episode, unspecified: Secondary | ICD-10-CM | POA: Diagnosis not present

## 2016-01-18 DIAGNOSIS — E785 Hyperlipidemia, unspecified: Secondary | ICD-10-CM | POA: Diagnosis not present

## 2016-01-18 DIAGNOSIS — E7439 Other disorders of intestinal carbohydrate absorption: Secondary | ICD-10-CM

## 2016-01-18 DIAGNOSIS — F32A Depression, unspecified: Secondary | ICD-10-CM

## 2016-01-18 DIAGNOSIS — E119 Type 2 diabetes mellitus without complications: Secondary | ICD-10-CM | POA: Diagnosis not present

## 2016-01-18 DIAGNOSIS — E118 Type 2 diabetes mellitus with unspecified complications: Secondary | ICD-10-CM

## 2016-01-18 MED ORDER — GABAPENTIN 600 MG PO TABS: 600.0000 mg | ORAL_TABLET | Freq: Three times a day (TID) | ORAL | Status: DC

## 2016-01-18 NOTE — Patient Instructions (Signed)
Take 300 mg 3 times per day

## 2016-01-18 NOTE — Progress Notes (Signed)
   Subjective:    Patient ID: Adam [name], male    DOB: May 01, 1951, 65 y.o.   MRN: [id number]  HPI He is here for a follow-up visit. He has had a very eventful existence for the last several months. He has no postop on above-the-knee amputation on the right and doing quite well with this. He is using OxyContin roughly 3 times per day but stating this is getting less painful. He is back at home and his wife is helping to care for him. He is having difficulty with phantom pain and is taking gabapentin 800 mg twice per day. He also taking Celebrex. He states his blood sugars have been running below 128. He has new onset diabetes so his peripheral vascular disease started before the diagnosis of diabetes.Medications were reviewed and he continues on his lisinopril, Protonix, Lipitor and Lexapro. Psychologically he seems to be handling this fairly well.   Review of Systems     Objective:   Physical Exam Alert and in no distress. His affect is quite good.       Assessment & Plan:  Hypertension associated with diabetes ([place])  Phantom pain after amputation of lower extremity ([facility]) - Plan: gabapentin (NEURONTIN) 600 MG tablet  Screening for prostate cancer - Plan: PSA, Medicare  Above knee amputation of right lower extremity ([place])  Hyperlipidemia associated with type 2 diabetes mellitus ([place])  Type 2 diabetes mellitus with complication, without long-term current use of insulin ([place])  Depression Continue on the lisinopril, Celebrex and the OxyContin. Explained that I would like to eventually get him off the OxyContin and only on Celebrex and potentially even off that. I will increase his gabapentin to 300 mg 3 times a day and continue to increase this as needed for control of his neuropathic/phantom pain.he will continue on his statin as well as blood pressure medication. He will continue to check blood sugar regularly. Discussed that Lexapro and at this point I do not plan on stopping it  for probably 6 months. Over 30 minutes, greater than 50% spent in counseling and coordination of care.

## 2016-01-19 LAB — PSA, MEDICARE: PSA: 0.77 ng/mL (ref <=4.00)

## 2016-01-21 ENCOUNTER — Telehealth: Payer: Self-pay | Admitting: Family Medicine

## 2016-01-21 ENCOUNTER — Other Ambulatory Visit: Payer: Self-pay | Admitting: *Deleted

## 2016-01-21 DIAGNOSIS — Z4781 Encounter for orthopedic aftercare following surgical amputation: Secondary | ICD-10-CM | POA: Diagnosis not present

## 2016-01-21 DIAGNOSIS — I739 Peripheral vascular disease, unspecified: Secondary | ICD-10-CM | POA: Diagnosis not present

## 2016-01-21 DIAGNOSIS — Z89611 Acquired absence of right leg above knee: Secondary | ICD-10-CM | POA: Diagnosis not present

## 2016-01-21 DIAGNOSIS — E119 Type 2 diabetes mellitus without complications: Secondary | ICD-10-CM | POA: Diagnosis not present

## 2016-01-21 DIAGNOSIS — E785 Hyperlipidemia, unspecified: Secondary | ICD-10-CM | POA: Diagnosis not present

## 2016-01-21 DIAGNOSIS — I10 Essential (primary) hypertension: Secondary | ICD-10-CM | POA: Diagnosis not present

## 2016-01-21 NOTE — Telephone Encounter (Signed)
Call in Ambien 5mg .#20 daily at bedtime sleep

## 2016-01-21 NOTE — Telephone Encounter (Signed)
Pt's wife left a message for me to call her back. Pt states that the increased dose of the gabapentin did not help with his sleep issues. She states that only thing that has helped is xanax taken at night before bed. She states it helps calm him down so he can sleep. They are requesting xanax be called in for him as [facility] [address]. Pt can be reached at [phone].

## 2016-01-23 ENCOUNTER — Other Ambulatory Visit: Payer: Self-pay | Admitting: Family Medicine

## 2016-01-23 DIAGNOSIS — Z89611 Acquired absence of right leg above knee: Secondary | ICD-10-CM | POA: Diagnosis not present

## 2016-01-23 DIAGNOSIS — Z4781 Encounter for orthopedic aftercare following surgical amputation: Secondary | ICD-10-CM | POA: Diagnosis not present

## 2016-01-23 DIAGNOSIS — E119 Type 2 diabetes mellitus without complications: Secondary | ICD-10-CM | POA: Diagnosis not present

## 2016-01-23 DIAGNOSIS — E785 Hyperlipidemia, unspecified: Secondary | ICD-10-CM | POA: Diagnosis not present

## 2016-01-23 DIAGNOSIS — I739 Peripheral vascular disease, unspecified: Secondary | ICD-10-CM | POA: Diagnosis not present

## 2016-01-23 DIAGNOSIS — I10 Essential (primary) hypertension: Secondary | ICD-10-CM | POA: Diagnosis not present

## 2016-01-25 DIAGNOSIS — Z89611 Acquired absence of right leg above knee: Secondary | ICD-10-CM | POA: Diagnosis not present

## 2016-01-25 DIAGNOSIS — I10 Essential (primary) hypertension: Secondary | ICD-10-CM | POA: Diagnosis not present

## 2016-01-25 DIAGNOSIS — E119 Type 2 diabetes mellitus without complications: Secondary | ICD-10-CM | POA: Diagnosis not present

## 2016-01-25 DIAGNOSIS — Z4781 Encounter for orthopedic aftercare following surgical amputation: Secondary | ICD-10-CM | POA: Diagnosis not present

## 2016-01-25 DIAGNOSIS — I739 Peripheral vascular disease, unspecified: Secondary | ICD-10-CM | POA: Diagnosis not present

## 2016-01-25 DIAGNOSIS — E785 Hyperlipidemia, unspecified: Secondary | ICD-10-CM | POA: Diagnosis not present

## 2016-01-28 DIAGNOSIS — Z4781 Encounter for orthopedic aftercare following surgical amputation: Secondary | ICD-10-CM | POA: Diagnosis not present

## 2016-01-28 DIAGNOSIS — E785 Hyperlipidemia, unspecified: Secondary | ICD-10-CM | POA: Diagnosis not present

## 2016-01-28 DIAGNOSIS — I739 Peripheral vascular disease, unspecified: Secondary | ICD-10-CM | POA: Diagnosis not present

## 2016-01-28 DIAGNOSIS — Z89611 Acquired absence of right leg above knee: Secondary | ICD-10-CM | POA: Diagnosis not present

## 2016-01-28 DIAGNOSIS — E119 Type 2 diabetes mellitus without complications: Secondary | ICD-10-CM | POA: Diagnosis not present

## 2016-01-28 DIAGNOSIS — I10 Essential (primary) hypertension: Secondary | ICD-10-CM | POA: Diagnosis not present

## 2016-01-30 DIAGNOSIS — Z89611 Acquired absence of right leg above knee: Secondary | ICD-10-CM | POA: Diagnosis not present

## 2016-01-30 DIAGNOSIS — E119 Type 2 diabetes mellitus without complications: Secondary | ICD-10-CM | POA: Diagnosis not present

## 2016-01-30 DIAGNOSIS — I739 Peripheral vascular disease, unspecified: Secondary | ICD-10-CM | POA: Diagnosis not present

## 2016-01-30 DIAGNOSIS — I10 Essential (primary) hypertension: Secondary | ICD-10-CM | POA: Diagnosis not present

## 2016-01-30 DIAGNOSIS — E785 Hyperlipidemia, unspecified: Secondary | ICD-10-CM | POA: Diagnosis not present

## 2016-01-30 DIAGNOSIS — Z4781 Encounter for orthopedic aftercare following surgical amputation: Secondary | ICD-10-CM | POA: Diagnosis not present

## 2016-02-01 ENCOUNTER — Telehealth: Payer: Self-pay | Admitting: Family Medicine

## 2016-02-01 DIAGNOSIS — E785 Hyperlipidemia, unspecified: Secondary | ICD-10-CM | POA: Diagnosis not present

## 2016-02-01 DIAGNOSIS — I10 Essential (primary) hypertension: Secondary | ICD-10-CM | POA: Diagnosis not present

## 2016-02-01 DIAGNOSIS — Z89611 Acquired absence of right leg above knee: Secondary | ICD-10-CM | POA: Diagnosis not present

## 2016-02-01 DIAGNOSIS — E119 Type 2 diabetes mellitus without complications: Secondary | ICD-10-CM | POA: Diagnosis not present

## 2016-02-01 DIAGNOSIS — I739 Peripheral vascular disease, unspecified: Secondary | ICD-10-CM | POA: Diagnosis not present

## 2016-02-01 DIAGNOSIS — Z4781 Encounter for orthopedic aftercare following surgical amputation: Secondary | ICD-10-CM | POA: Diagnosis not present

## 2016-02-01 NOTE — Telephone Encounter (Signed)
Abbie please handle 

## 2016-02-01 NOTE — Telephone Encounter (Signed)
Pt's wife, [name], requesting that pt's BP med be reviewed because she thinks he may be on the wrong MG's since med was changed when he went into the hospital around 01/01/16 and since that time wife said bp # have been running high. [name] said he is on 12.5mg 's now and he was on 25mg  before his hospital visit.

## 2016-02-01 NOTE — Telephone Encounter (Signed)
I took care of this call and explained no benefit of 25 mg. Nothing else needs to be done

## 2016-02-04 DIAGNOSIS — E119 Type 2 diabetes mellitus without complications: Secondary | ICD-10-CM | POA: Diagnosis not present

## 2016-02-04 DIAGNOSIS — I10 Essential (primary) hypertension: Secondary | ICD-10-CM | POA: Diagnosis not present

## 2016-02-04 DIAGNOSIS — I739 Peripheral vascular disease, unspecified: Secondary | ICD-10-CM | POA: Diagnosis not present

## 2016-02-04 DIAGNOSIS — Z4781 Encounter for orthopedic aftercare following surgical amputation: Secondary | ICD-10-CM | POA: Diagnosis not present

## 2016-02-04 DIAGNOSIS — E785 Hyperlipidemia, unspecified: Secondary | ICD-10-CM | POA: Diagnosis not present

## 2016-02-04 DIAGNOSIS — Z89611 Acquired absence of right leg above knee: Secondary | ICD-10-CM | POA: Diagnosis not present

## 2016-02-06 DIAGNOSIS — I10 Essential (primary) hypertension: Secondary | ICD-10-CM | POA: Diagnosis not present

## 2016-02-06 DIAGNOSIS — Z4781 Encounter for orthopedic aftercare following surgical amputation: Secondary | ICD-10-CM | POA: Diagnosis not present

## 2016-02-06 DIAGNOSIS — Z89611 Acquired absence of right leg above knee: Secondary | ICD-10-CM | POA: Diagnosis not present

## 2016-02-06 DIAGNOSIS — E785 Hyperlipidemia, unspecified: Secondary | ICD-10-CM | POA: Diagnosis not present

## 2016-02-06 DIAGNOSIS — E119 Type 2 diabetes mellitus without complications: Secondary | ICD-10-CM | POA: Diagnosis not present

## 2016-02-06 DIAGNOSIS — I739 Peripheral vascular disease, unspecified: Secondary | ICD-10-CM | POA: Diagnosis not present

## 2016-02-08 DIAGNOSIS — E119 Type 2 diabetes mellitus without complications: Secondary | ICD-10-CM | POA: Diagnosis not present

## 2016-02-08 DIAGNOSIS — I10 Essential (primary) hypertension: Secondary | ICD-10-CM | POA: Diagnosis not present

## 2016-02-08 DIAGNOSIS — Z89611 Acquired absence of right leg above knee: Secondary | ICD-10-CM | POA: Diagnosis not present

## 2016-02-08 DIAGNOSIS — E785 Hyperlipidemia, unspecified: Secondary | ICD-10-CM | POA: Diagnosis not present

## 2016-02-08 DIAGNOSIS — I739 Peripheral vascular disease, unspecified: Secondary | ICD-10-CM | POA: Diagnosis not present

## 2016-02-08 DIAGNOSIS — Z4781 Encounter for orthopedic aftercare following surgical amputation: Secondary | ICD-10-CM | POA: Diagnosis not present

## 2016-02-11 ENCOUNTER — Encounter: Payer: Self-pay | Admitting: Family Medicine

## 2016-02-11 ENCOUNTER — Ambulatory Visit (INDEPENDENT_AMBULATORY_CARE_PROVIDER_SITE_OTHER): Payer: Medicare Other | Admitting: Family Medicine

## 2016-02-11 VITALS — BP 150/100 | HR 81 | Ht 71.0 in

## 2016-02-11 DIAGNOSIS — E119 Type 2 diabetes mellitus without complications: Secondary | ICD-10-CM | POA: Diagnosis not present

## 2016-02-11 DIAGNOSIS — I10 Essential (primary) hypertension: Secondary | ICD-10-CM

## 2016-02-11 DIAGNOSIS — I739 Peripheral vascular disease, unspecified: Secondary | ICD-10-CM | POA: Diagnosis not present

## 2016-02-11 DIAGNOSIS — E1159 Type 2 diabetes mellitus with other circulatory complications: Secondary | ICD-10-CM

## 2016-02-11 DIAGNOSIS — Z4781 Encounter for orthopedic aftercare following surgical amputation: Secondary | ICD-10-CM | POA: Diagnosis not present

## 2016-02-11 DIAGNOSIS — I70261 Atherosclerosis of native arteries of extremities with gangrene, right leg: Secondary | ICD-10-CM

## 2016-02-11 DIAGNOSIS — E785 Hyperlipidemia, unspecified: Secondary | ICD-10-CM | POA: Diagnosis not present

## 2016-02-11 DIAGNOSIS — Z89611 Acquired absence of right leg above knee: Secondary | ICD-10-CM | POA: Diagnosis not present

## 2016-02-11 DIAGNOSIS — I152 Hypertension secondary to endocrine disorders: Secondary | ICD-10-CM

## 2016-02-11 MED ORDER — LISINOPRIL-HYDROCHLOROTHIAZIDE 20-12.5 MG PO TABS: 1.0000 | ORAL_TABLET | Freq: Every day | ORAL | Status: DC

## 2016-02-11 NOTE — Progress Notes (Signed)
   Subjective:    Patient ID: Adam [name], male    DOB: 17-Sep-1951, 65 y.o.   MRN: [id number]  HPI Continues on lisinopril/HCTZ but does note that the color of the pill is different and he was taken prior to his hospitalization. Over the last 2 weeks his blood pressure has been running in the 150/100 range.   Review of Systems     Objective:   Physical Exam Alert and in no distress otherwise not examined       Assessment & Plan:  Essential hypertension - Plan: lisinopril-hydrochlorothiazide (PRINZIDE,ZESTORETIC) 20-12.5 MG tablet  Hypertension associated with diabetes ([place]) right for dispense as written and see if his pressure stabilizes. If not I will then switch to a different antihypertensive medication to get his numbers below 130/80.

## 2016-02-12 ENCOUNTER — Telehealth: Payer: Self-pay | Admitting: Family Medicine

## 2016-02-12 MED ORDER — LOSARTAN POTASSIUM-HCTZ 100-12.5 MG PO TABS: 1.0000 | ORAL_TABLET | Freq: Every day | ORAL | Status: DC

## 2016-02-12 NOTE — Telephone Encounter (Signed)
Further discussion with the pharmacist indicates he's been getting the same generic product since July. Since his blood pressures going up, I will switch him to losartan/HCTZ.

## 2016-02-12 NOTE — Telephone Encounter (Signed)
Pt's wife states that script for Lisinopril was not sent over stating "brand name necessary" so that pharmacy would give pt brand name & not generic as Dr Redmond School wanted. Also pharmacy advised them that med is not available & not sure when they could get it to him so wife want to know if Dr Redmond School can change med because pt need bp med asap

## 2016-02-13 DIAGNOSIS — I739 Peripheral vascular disease, unspecified: Secondary | ICD-10-CM | POA: Diagnosis not present

## 2016-02-13 DIAGNOSIS — E785 Hyperlipidemia, unspecified: Secondary | ICD-10-CM | POA: Diagnosis not present

## 2016-02-13 DIAGNOSIS — I10 Essential (primary) hypertension: Secondary | ICD-10-CM | POA: Diagnosis not present

## 2016-02-13 DIAGNOSIS — Z4781 Encounter for orthopedic aftercare following surgical amputation: Secondary | ICD-10-CM | POA: Diagnosis not present

## 2016-02-13 DIAGNOSIS — Z89611 Acquired absence of right leg above knee: Secondary | ICD-10-CM | POA: Diagnosis not present

## 2016-02-13 DIAGNOSIS — E119 Type 2 diabetes mellitus without complications: Secondary | ICD-10-CM | POA: Diagnosis not present

## 2016-02-15 DIAGNOSIS — E785 Hyperlipidemia, unspecified: Secondary | ICD-10-CM | POA: Diagnosis not present

## 2016-02-15 DIAGNOSIS — I739 Peripheral vascular disease, unspecified: Secondary | ICD-10-CM | POA: Diagnosis not present

## 2016-02-15 DIAGNOSIS — Z4781 Encounter for orthopedic aftercare following surgical amputation: Secondary | ICD-10-CM | POA: Diagnosis not present

## 2016-02-15 DIAGNOSIS — I10 Essential (primary) hypertension: Secondary | ICD-10-CM | POA: Diagnosis not present

## 2016-02-15 DIAGNOSIS — E119 Type 2 diabetes mellitus without complications: Secondary | ICD-10-CM | POA: Diagnosis not present

## 2016-02-15 DIAGNOSIS — Z89611 Acquired absence of right leg above knee: Secondary | ICD-10-CM | POA: Diagnosis not present

## 2016-02-20 ENCOUNTER — Encounter: Payer: Medicare Other | Attending: Family Medicine | Admitting: *Deleted

## 2016-02-20 ENCOUNTER — Encounter: Payer: Self-pay | Admitting: *Deleted

## 2016-02-20 VITALS — Ht 71.0 in | Wt 162.0 lb

## 2016-02-20 DIAGNOSIS — E119 Type 2 diabetes mellitus without complications: Secondary | ICD-10-CM | POA: Insufficient documentation

## 2016-02-20 NOTE — Progress Notes (Signed)
Diabetes Self-Management Education  Visit Type: First/Initial  Appt. Start Time: 1030 Appt. End Time: [id number]  02/20/2016  Adam [name], identified by name and date of birth, is a 65 y.o. male with a diagnosis of Diabetes: Type 2.   ASSESSMENT  Height 5\' 11"  (1.803 m), weight 162 lb (73.483 kg). Body mass index is 22.6 kg/(m^2).      Diabetes Self-Management Education - 02/20/16 1031    Visit Information   Visit Type First/Initial   Initial Visit   Diabetes Type Type 2   Are you currently following a meal plan? Yes   Are you taking your medications as prescribed? Not on Medications   Date Diagnosed 11/2015   Health Coping   How would you rate your overall health? Excellent   Psychosocial Assessment   Patient Belief/Attitude about Diabetes Motivated to manage diabetes   Self-care barriers None   Other persons present Patient   Patient Concerns Nutrition/Meal planning   Preferred Learning Style Visual   Learning Readiness Change in progress   What is the last grade level you completed in school? 4 years college   Complications   Last HgB A1C per patient/outside source 5.9 %   How often do you check your blood sugar? 1-2 times/day   Have you had a dilated eye exam in the past 12 months? No   Have you had a dental exam in the past 12 months? Yes   Are you checking your feet? Yes   How many days per week are you checking your feet? 7   Dietary Intake   Breakfast used to be fresh fruit only. Now having whole grain toast, fresh fruit, occasionally an egg and bacon   Snack (morning) not usually unless a protein bar   Lunch sandwich with chips OR left overs   Snack (afternoon) fresh fruit OR protein   Dinner lean meat, vegetables, occasionally a whole grain starch   Snack (evening) fresh fruit   Beverage(s) water, diet soda, coffee with cream and Splenda   Exercise   Exercise Type Light (walking / raking leaves)  has just finished physical therapy from AKA amputation, plans  to go to gym too   How many days per week to you exercise? 3   How many minutes per day do you exercise? 30   Total minutes per week of exercise 90   Patient Education   Previous Diabetes Education No   Disease state  Definition of diabetes, type 1 and 2, and the diagnosis of diabetes;Factors that contribute to the development of diabetes   Nutrition management  Role of diet in the treatment of diabetes and the relationship between the three main macronutrients and blood glucose level;Food label reading, portion sizes and measuring food.;Carbohydrate counting   Physical activity and exercise  Role of exercise on diabetes management, blood pressure control and cardiac health.   Monitoring Purpose and frequency of SMBG.;Identified appropriate SMBG and/or A1C goals.   Individualized Goals (developed by patient)   Nutrition Follow meal plan discussed;General guidelines for healthy choices and portions discussed   Physical Activity Exercise 3-5 times per week   Medications Not Applicable   Monitoring  test blood glucose pre and post meals as discussed  as long as BG are within target ranges, twice a week should be adequate   Outcomes   Expected Outcomes Demonstrated interest in learning. Expect positive outcomes   Future DMSE PRN   Program Status Completed      Individualized Plan for  Diabetes Self-Management Training:   Learning Objective:  Patient will have a greater understanding of diabetes self-management. Patient education plan is to attend individual and/or group sessions per assessed needs and concerns.   Plan:   Patient Instructions  Plan:  Aim for 4 Carb Choices per meal (60 grams) +/- 1 either way  Aim for 0-2 Carbs per snack if hungry  Include protein in moderation with your meals and snacks Consider reading food labels for Total Carbohydrate of foods Continue with  your activity level as tolerated Consider checking BG at alternate times per day a couple of days a week   Look up [place] :-)       Expected Outcomes:  Demonstrated interest in learning. Expect positive outcomes  Education material provided: Living Well with Diabetes, A1C conversion sheet, Meal plan card and Carbohydrate counting sheet  If problems or questions, patient to contact team via:  Phone and Email  Future DSME appointment: PRN

## 2016-02-20 NOTE — Patient Instructions (Addendum)
Plan:  Aim for 4 Carb Choices per meal (60 grams) +/- 1 either way  Aim for 0-2 Carbs per snack if hungry  Include protein in moderation with your meals and snacks Consider reading food labels for Total Carbohydrate of foods Continue with  your activity level as tolerated Consider checking BG at alternate times per day a couple of days a week  Look up [place] :-)

## 2016-02-25 ENCOUNTER — Other Ambulatory Visit: Payer: Self-pay | Admitting: Family Medicine

## 2016-02-25 NOTE — Telephone Encounter (Signed)
Is this okay to refill? 

## 2016-03-05 ENCOUNTER — Telehealth: Payer: Self-pay

## 2016-03-05 NOTE — Telephone Encounter (Signed)
Phone call from pt's wife.  Reported that the pt. Is c/o "tightness and swelling of right thigh.  Reported no redness, warmth, or signs if inflammation.  Reported the tightness does improve overnight.  Reported the pt. states that at times it feels like his right thigh "could explode."  Stated the incisional area of the AKA stump has healed; denied any soreness/ tenderness in incisional area.  Wife stated the pt. Wants to see Dr. Donnetta Hutching.  Informed we can offer an earlier appt. with the nurse practitioner, when Dr. Donnetta Hutching is in office.  Agreed.  Advised will contact pt. with an appt.

## 2016-03-06 ENCOUNTER — Encounter: Payer: Self-pay | Admitting: Family

## 2016-03-06 ENCOUNTER — Ambulatory Visit (INDEPENDENT_AMBULATORY_CARE_PROVIDER_SITE_OTHER): Payer: Self-pay | Admitting: Family

## 2016-03-06 VITALS — BP 176/93 | HR 78 | Temp 98.4°F | Ht 71.0 in | Wt 169.0 lb

## 2016-03-06 DIAGNOSIS — I739 Peripheral vascular disease, unspecified: Secondary | ICD-10-CM

## 2016-03-06 DIAGNOSIS — Z89611 Acquired absence of right leg above knee: Secondary | ICD-10-CM

## 2016-03-06 DIAGNOSIS — G546 Phantom limb syndrome with pain: Secondary | ICD-10-CM

## 2016-03-06 NOTE — Progress Notes (Signed)
    Postoperative Visit   History of Present Illness  Adam [name] is a 65 y.o. male patient of Dr. Donnetta Hutching who is s/p right above-knee amputation on 12/26/15.  Very complicated course. He underwent initially right femoral to popliteal bypass by Dr. Bridgett Larsson. This subsequently occluded and he had nonhealing coarctation presenting with severe pain. He underwent a below knee amputation with a borderline flow. This failed as well and he subsequently underwent above-knee amputation.  He has done well since discharge from rehabilitation. Pt last saw Dr. Donnetta Hutching on 01/15/16. At that time he was walking with a walker. His amputation site was healing quite nicely. Staples were removed that day. He was referred to [facility] for stump shrinking and eventual prosthetic fitting. He was to return on an as-needed basis. He returns today with c/o tight feeling in his right AKA stump for about a week. This is not getting worse. His stump is completely healed, he is using the stump shrinker and is working with [employer] for his prosthesis fitting. He is walking a great deal an exercising.  Pt state his blood pressure always is high in a medical office; states at home a couple of weeks ago his blood pressure was [id number] systolic.   For VQI Use Only  PRE-ADM LIVING: Home  AMB STATUS: Ambulatory with crutches  Physical Examination  Filed Vitals:   03/06/16 1012 03/06/16 1013  BP: 191/84 176/93  Pulse: 78   Temp: 98.4 F (36.9 C)   TempSrc: Oral   Height: 5\' 11"  (1.803 m)   Weight: 169 lb (76.658 kg)   SpO2: 98%    Body mass index is 23.58 kg/(m^2).   Medical Decision Making  Adam [name] is a 65 y.o. male who presents s/p right above-knee amputation on 12/26/15.  The patient's stump is well healed. There is no swelling of the right AKA stump.  He has phantom pain in his right LE that is adequately controlled with gabapentin.  Dr. Oneida Alar spoke with and examined pt.  Pt is working with [employer] re  prosthesis fitting. I discussed in depth with the patient the nature of atherosclerosis, and emphasized the importance of maximal medical management including strict control of blood pressure, blood glucose, and lipid levels, obtaining regular exercise, and cessation of smoking.  The patient is aware that without maximal medical management the underlying atherosclerotic disease process will progress, possibly leading to a more proximal amputation. The patient agrees to participate in their maximal medical care.  Thank you for allowing [place] to participate in this patient's care.  The patient can follow up with [place] as needed.  Ameyah Bangura, Sharmon Leyden, RN, MSN, FNP-C [facility] Office: [phone]  03/06/2016, 10:16 AM  Clinic MD: Oneida Alar

## 2016-03-20 ENCOUNTER — Ambulatory Visit: Payer: Medicare Other | Attending: Vascular Surgery | Admitting: Physical Therapy

## 2016-03-20 ENCOUNTER — Encounter: Payer: Self-pay | Admitting: Physical Therapy

## 2016-03-20 DIAGNOSIS — R2681 Unsteadiness on feet: Secondary | ICD-10-CM | POA: Diagnosis not present

## 2016-03-20 DIAGNOSIS — R29898 Other symptoms and signs involving the musculoskeletal system: Secondary | ICD-10-CM | POA: Insufficient documentation

## 2016-03-20 DIAGNOSIS — R2689 Other abnormalities of gait and mobility: Secondary | ICD-10-CM | POA: Insufficient documentation

## 2016-03-20 DIAGNOSIS — Z89611 Acquired absence of right leg above knee: Secondary | ICD-10-CM | POA: Insufficient documentation

## 2016-03-20 NOTE — Therapy (Signed)
[facility] [address] [place], [place], [address] Phone: [phone]   Fax:  [phone]  Physical Therapy Evaluation  Patient Details  Name: Adam [name] MRN: [id number] Date of Birth: [birth year] Referring Provider: [name], MD  Encounter Date: 03/20/2016      PT End of Session - 03/20/16 1009    Visit Number 1   Number of Visits 17   Date for PT Re-Evaluation 05/19/16   Authorization Type Medicare G-code & progress note   PT Start Time 0805   PT Stop Time 0845   PT Time Calculation (min) 40 min   Equipment Utilized During Treatment Gait belt   Activity Tolerance Patient tolerated treatment well   Behavior During Therapy [facility] for tasks assessed/performed      Past Medical History  Diagnosis Date  . Hypertension   . Peripheral vascular disease ([place])   . Gangrene ([place]) 11/22/2015    right great toe  . OSA on CPAP   . Type II diabetes mellitus ([place]) dx'd 11/2015  . PAD (peripheral artery disease) [facility])     Past Surgical History  Procedure Laterality Date  . Peripheral vascular catheterization N/A 11/26/2015    Procedure: Abdominal Aortogram;  Surgeon: [name], MD;  Location: [facility];  Service: Cardiovascular;  Laterality: N/A;  . Vasectomy    . Colonoscopy    . Femoral-popliteal bypass graft Right 11/28/2015    Procedure: RIGHT  FEMORAL-POPLITEAL ARTERY BYPASS GRAFT AND CONSTRUCTION OF MILLER CUFF USING 6 MM X 80 CM PROPATEN  GRAFT ;  Surgeon: Conrad [place], MD;  Location: [facility];  Service: Vascular;  Laterality: Right;  . Amputation Right 12/01/2015    Procedure: AMPUTATION RIGHT GREAT;  Surgeon: Conrad Oaks, MD;  Location: [facility];  Service: Vascular;  Laterality: Right;  . Amputation Right 12/16/2015    Procedure: AMPUTATION BELOW KNEE;  Surgeon: [name], MD;  Location: [employer];  Service: Vascular;  Laterality: Right;  . Amputation Right 12/26/2015    Procedure: RIGHT ABOVE KNEE  AMPUTATION;  Surgeon: [name], MD;  Location: [facility];  Service: Vascular;  Laterality: Right;  . Inguinal hernia repair Bilateral 2000    There were no vitals filed for this visit.       Subjective Assessment - 03/20/16 0808    Subjective This 65yo male had fem-pop bypass 11/27/2105 which failed. He underwent great toe amputation 1/1/42017 with revision to Transtibial Amputation 12/15/2105 and eventually revised to right Transfemoral Amputation on 12/26/2015. He recieved his first prosthesis 03/18/2016. He presents for PT evaluation & prosthetic training.     Pertinent History DM, HTN   Limitations Lifting;Standing;Walking   Patient Stated Goals Use prosthesis to play golf, go to beach house including boating, yardwork   Currently in Pain? No/denies            [facility] PT Assessment - 03/20/16 0800    Assessment   Medical Diagnosis right Transfemoral Amputation   Referring Provider [name], MD   Onset Date/Surgical Date 03/18/16  prosthesis delivery   Hand Dominance Right   Precautions   Precautions Fall   Restrictions   Weight Bearing Restrictions No   Balance Screen   Has the patient fallen in the past 6 months Yes   How many times? 2  crutch caught & off balance, no injuries   Has the patient had a decrease in activity level because of a fear of falling?  No  Is the patient reluctant to leave their home because of a fear of falling?  No   Home Environment   Living Environment Private residence   Living Arrangements Spouse/significant other   Type of [place] to enter   Entrance Stairs-Number of Steps 3   Entrance Stairs-Rails Right;Left;Can reach both   [place] Multi-level;1/2 bath on main level  3 story, his bedroom is on 2nd floor   Alternate Level Stairs-Number of Steps 16   Alternate Level Stairs-Rails Left   Home Equipment Crutches;Walker - 2 wheels   Prior Function   Level of Independence Independent;Independent with gait    Vocation Full time employment   [employer], traveling   Leisure golf, boating, Haematologist,    Observation/Other Assessments   Focus on Therapeutic Outcomes (FOTO)  49.49 Functional Status   Fear Avoidance Belief Questionnaire (FABQ)  26 (8)   Posture/Postural Control   Posture/Postural Control Postural limitations   Postural Limitations Weight shift left   ROM / Strength   AROM / PROM / Strength AROM;Strength   AROM   Overall AROM  Within functional limits for tasks performed   Strength   Overall Strength Within functional limits for tasks performed   Transfers   Transfers Sit to Stand;Stand to Sit   Sit to Stand 4: Min assist;With upper extremity assist;With armrests;From chair/3-in-1  requires minA to stabilize and cues on prosthetic knee contr   Stand to Sit 4: Min assist;With upper extremity assist;With armrests;To chair/3-in-1  requires minA to stabilize and cues on prosthetic knee contr   Ambulation/Gait   Ambulation/Gait Yes   Ambulation/Gait Assistance 4: Min assist  max cues on prosthesis control   Ambulation/Gait Assistance Details Patient arrived not wearing prosthesis hopping with axillary crutches modified independent   Ambulation Distance (Feet) 120 Feet   Assistive device Crutches;Prosthesis   Gait Pattern Step-to pattern;Decreased step length - left;Decreased stance time - right;Decreased stride length;Decreased hip/knee flexion - right;Decreased weight shift to right;Right hip hike;Right circumduction;Right flexed knee in stance;Trunk flexed;Abducted- right;Poor foot clearance - right   Ambulation Surface Indoor;Level   Gait velocity 0.55 ft/sec   Standardized Balance Assessment   Standardized Balance Assessment Berg Balance Test   Berg Balance Test   Sit to Stand Needs minimal aid to stand or to stabilize   Standing Unsupported Able to stand 2 minutes with supervision   Sitting with Back Unsupported but Feet Supported on Floor or Stool Able to sit  safely and securely 2 minutes   Stand to Sit Controls descent by using hands   Transfers Able to transfer safely, minor use of hands   Standing Unsupported with Eyes Closed Able to stand 10 seconds with supervision   Standing Ubsupported with Feet Together Needs help to attain position but able to stand for 30 seconds with feet together   From Standing, Reach Forward with Outstretched Arm Reaches forward but needs supervision   From Standing Position, Pick up Object from Floor Unable to pick up and needs supervision   From Standing Position, Turn to Look Behind Over each Shoulder Needs supervision when turning   Turn 360 Degrees Needs assistance while turning   Standing Unsupported, Alternately Place Feet on Step/Stool Needs assistance to keep from falling or unable to try   Standing Unsupported, One Foot in [employer] balance while stepping or standing   Standing on One Leg Tries to lift leg/unable to hold 3 seconds but remains standing independently   Total Score  23         Prosthetics Assessment - 03/20/16 0800    Prosthetics   Prosthetic Care Dependent with Skin check;Residual limb care;Prosthetic cleaning;Ply sock cleaning;Correct ply sock adjustment;Proper wear schedule/adjustment;Proper weight-bearing schedule/adjustment   Donning prosthesis  Min assist  max cues   Current prosthetic wear tolerance (days/week)  worn 1 of 2 days since delivery   Current prosthetic wear tolerance (#hours/day)  15 minutes   Current prosthetic weight-bearing tolerance (hours/day)  5 min standing balance & gait wit partial weight bearing with no c/o pain or discomfort   Edema non-pitting   Residual limb condition  no open areas, cylinderical, minimal hair growth, normal color, temperature & moisture   K code/activity level with prosthetic use  3 Full community with variable cadence                  [facility] Adult PT Treatment/Exercise - 03/20/16 0800    Prosthetics   Education Provided Skin  check;Residual limb care;Prosthetic cleaning;Correct ply sock adjustment;Proper Donning;Proper wear schedule/adjustment;Proper weight-bearing schedule/adjustment   Person(s) Educated Patient   Education Method Explanation;Verbal cues;Demonstration   Education Method Verbalized understanding;Needs further instruction                  PT Short Term Goals - 03/20/16 1201    PT SHORT TERM GOAL #1   Title Patient tolerates wear of prosthesis >10hrs total /day without skin issues or limb pain. (Target Date: 04/18/2016)   Time 4   Period Weeks   Status New   PT SHORT TERM GOAL #2   Title Patient demonstrates proper donning & verbalizes proper cleaning of prosthesis.  (Target Date: 04/18/2016)   Time 4   Period Weeks   Status New   PT SHORT TERM GOAL #3   Title Patient ambulates 200' with crutches & prosthesis safely with cues only for deviations not physical assist.  (Target Date: 04/18/2016)   Time 4   Period Weeks   Status New   PT SHORT TERM GOAL #4   Title Patient negotiates ramps, curbs and stairs (1 rail) with crutches with supervision.  (Target Date: 04/18/2016)   Time 4   Period Weeks   Status New   PT SHORT TERM GOAL #5   Title Patient able to reach 7" anteriorly, laterally, across midline and pick up object from floor with prosthesis with supervision.  (Target Date: 04/18/2016)   Time 4   Period Weeks   Status New           PT Long Term Goals - 03/20/16 1154    PT LONG TERM GOAL #1   Title Patient verbalizes & demonstrates understanding of prosthetic care to enable safe use of prosthesis. (Target Date: 05/16/2016)   Time 8   Period Weeks   Status New   PT LONG TERM GOAL #2   Title Patient tolerates wear of prosthesis >90% of awake hours without skin issues or limb tenderness to enable function throughout his awake hours.  (Target Date: 05/16/2016)   Time 8   Period Weeks   Status New   PT LONG TERM GOAL #3   Title Berg Balance Test > 45/56 to indicate lower fall  risk.  (Target Date: 05/16/2016)   Time 8   Period Weeks   Status New   PT LONG TERM GOAL #4   Title Patient ambulates 500' with LRAD & prosthesis outside including grass, ramps & curbs modified independent for community access.  (Target Date: 05/16/2016)  Time 8   Period Weeks   Status New   PT LONG TERM GOAL #5   Title Patient ambulates around furniture carrrying plate & cup with only prosthesis independently.  (Target Date: 05/16/2016)   Time 8   Period Weeks   Status New   Additional Long Term Goals   Additional Long Term Goals Yes   PT LONG TERM GOAL #6   Title Patient able to transfer on/off ground and demonstrate yard work and golf swing with prosthesis modified independent to return to leisure activities.  (Target Date: 05/16/2016)   Time 8   Period Weeks   Status New               Plan - 03/22/16 1010    Clinical Impression Statement This highly active 65yo male had significant decline in mobility secondary to amputation initiating issues 4 months ago. His condition is evolving and treatment plan has moderate complexity. He is dependent in use & care of prosthesis negatively impacting ability to safely wear & use prosthesis without potential problems. He is limited in wear to 15 minutes only once since delivery 2 days ago which limited functional potential throughout his day. His balance is impaired with high fall risk noted by Merrilee Jansky Balance test score of 23/56. His gait is dependent requiring assist and constant cues to control prosthesis with gait velocity of 0.55 ft/sec and deviations with safe concerns.    Rehab Potential Good   PT Frequency 2x / week   PT Duration 8 weeks   PT Treatment/Interventions ADLs/Self Care Home Management;DME Instruction;Gait training;Stair training;Functional mobility training;Therapeutic activities;Therapeutic exercise;Balance training;Neuromuscular re-education;Patient/family education;Prosthetic Training   PT Next Visit Plan HEP for  mid-line & balance, gait with prosthesis & crutches   Consulted and Agree with Plan of Care Patient      Patient will benefit from skilled therapeutic intervention in order to improve the following deficits and impairments:  Abnormal gait, Decreased activity tolerance, Decreased balance, Decreased endurance, Decreased mobility, Postural dysfunction, Prosthetic Dependency  Visit Diagnosis: Other abnormalities of gait and mobility  Unsteadiness on feet  Other symptoms and signs involving the musculoskeletal system      G-Codes - 03-22-16 1205    Functional Assessment Tool Used patient has tolerated wear only 1 of 2 days since delivery for 15 minutes. He is dependent in prosthetic care.    Functional Limitation Self care   Self Care Current Status [phone]) At least 80 percent but less than 100 percent impaired, limited or restricted   Self Care Goal Status [id number]) At least 1 percent but less than 20 percent impaired, limited or restricted       Problem List Patient Active Problem List   Diagnosis Date Noted  . Type 2 diabetes with complication ([place]) [id number]  . Phantom pain after amputation of lower extremity ([place]) 01/18/2016  . Neuropathic pain   . Above knee amputation of right lower extremity ([place]) 12/29/2015  . PAD (peripheral artery disease) ([place]) 12/15/2015  . Hyperlipidemia associated with type 2 diabetes mellitus ([place]) 12/07/2015  . Hypertension associated with diabetes ([place]) 12/04/2014    Makaila Windle PT, DPT Mar 22, 2016, 12:07 PM  [facility] [address] [place], [place], [address] Phone: [phone]   Fax:  [phone]  Name: MARKELL AVRAM MRN: [id number] Date of Birth: Jun 21, 1951

## 2016-03-24 ENCOUNTER — Encounter: Payer: Self-pay | Admitting: Physical Therapy

## 2016-03-24 ENCOUNTER — Ambulatory Visit: Payer: Medicare Other | Admitting: Physical Therapy

## 2016-03-24 ENCOUNTER — Ambulatory Visit: Payer: No Typology Code available for payment source | Admitting: Physical Therapy

## 2016-03-24 DIAGNOSIS — R29898 Other symptoms and signs involving the musculoskeletal system: Secondary | ICD-10-CM | POA: Diagnosis not present

## 2016-03-24 DIAGNOSIS — R2689 Other abnormalities of gait and mobility: Secondary | ICD-10-CM

## 2016-03-24 DIAGNOSIS — R2681 Unsteadiness on feet: Secondary | ICD-10-CM

## 2016-03-24 DIAGNOSIS — Z89611 Acquired absence of right leg above knee: Secondary | ICD-10-CM | POA: Diagnosis not present

## 2016-03-24 NOTE — Therapy (Signed)
[facility] [address] [place], [place], [address] Phone: [phone]   Fax:  [phone]  Physical Therapy Treatment  Patient Details  Name: Adam [name] MRN: [id number] Date of Birth: 19-Jun-1951 Referring Provider: [name], MD  Encounter Date: 03/24/2016      PT End of Session - 03/24/16 1354    Visit Number 2   Number of Visits 17   Date for PT Re-Evaluation 05/19/16   Authorization Type Medicare G-code & progress note   PT Start Time 0849   PT Stop Time 0930   PT Time Calculation (min) 41 min   Equipment Utilized During Treatment Gait belt   Activity Tolerance Patient tolerated treatment well   Behavior During Therapy [facility] for tasks assessed/performed      Past Medical History  Diagnosis Date  . Hypertension   . Peripheral vascular disease ([place])   . Gangrene ([place]) 11/22/2015    right great toe  . OSA on CPAP   . Type II diabetes mellitus ([place]) dx'd 11/2015  . PAD (peripheral artery disease) [facility])     Past Surgical History  Procedure Laterality Date  . Peripheral vascular catheterization N/A 11/26/2015    Procedure: Abdominal Aortogram;  Surgeon: [name], MD;  Location: [place] [facility];  Service: Cardiovascular;  Laterality: N/A;  . Vasectomy    . Colonoscopy    . Femoral-popliteal bypass graft Right 11/28/2015    Procedure: RIGHT  FEMORAL-POPLITEAL ARTERY BYPASS GRAFT AND CONSTRUCTION OF MILLER CUFF USING 6 MM X 80 CM PROPATEN  GRAFT ;  Surgeon: [name], MD;  Location: [place];  Service: Vascular;  Laterality: Right;  . Amputation Right 12/01/2015    Procedure: AMPUTATION RIGHT GREAT;  Surgeon: [name], MD;  Location: [place];  Service: Vascular;  Laterality: Right;  . Amputation Right 12/16/2015    Procedure: AMPUTATION BELOW KNEE;  Surgeon: [name], MD;  Location: [place];  Service: Vascular;  Laterality: Right;  . Amputation Right 12/26/2015    Procedure: RIGHT ABOVE KNEE  AMPUTATION;  Surgeon: [name], MD;  Location: [place];  Service: Vascular;  Laterality: Right;  . Inguinal hernia repair Bilateral 2000    There were no vitals filed for this visit.      Subjective Assessment - 03/24/16 0851    Subjective (p) No falls. Wearing prosthesis 4hrs 2x/day with no issues.    Pertinent History (p) DM, HTN   Limitations (p) Lifting;Standing;Walking   Patient Stated Goals (p) Use prosthesis to play golf, go to beach house including boating, yardwork   Currently in Pain? (p) No/denies                         [facility] Adult PT Treatment/Exercise - 03/24/16 0850    Transfers   Transfers Sit to Stand;Stand to Sit   Sit to Stand 4: Min assist;5: Supervision;With upper extremity assist;With armrests;From chair/3-in-1  to crutches to stabilize, worked on Art therapist to Stand Details (indicate cue type and reason) verbal cues on prosthetic knee control, stabilizing without UE contact on RW,    Stand to Sit 5: Supervision;4: Min assist;With upper extremity assist;With armrests;To chair/3-in-1  from crutches; chairs without armrests   Stand to Sit Details verbal cues on prosthetic knee control, balancing withoiut UE contact   Ambulation/Gait   Ambulation/Gait Yes   Ambulation/Gait Assistance 5: Supervision  tactile cues   Ambulation/Gait  Assistance Details verbal cues on step length & weight over prosthesis, unlocking knee, initial contact with heel to lock knee   Ambulation Distance (Feet) 250 Feet  250' X 2   Assistive device Crutches;Prosthesis   Ambulation Surface Level;Indoor   Stairs Yes   Stairs Assistance 5: Supervision   Stairs Assistance Details (indicate cue type and reason) verbal cues on sequence, prosthetic knee control, & clearing prosthesis   Stair Management Technique Two rails;Step to pattern;Forwards;One rail Left;With crutches   Number of Stairs 4  2 reps   Ramp 4: Min assist  crutches & prosthesis   Ramp  Details (indicate cue type and reason) verbal & tactile cues on technique and weight shift   Curb 5: Supervision  crutches & prosthesis   Curb Details (indicate cue type and reason) verbal, demo & tactile cues on sequence, technique and prosthesis clearance   Pre-Gait Activities seated 4 point gait sequencing exercise   Prosthetics   Prosthetic Care Comments  signs of sweating, drying limb & liner, use of anti-perspirant on limb   Current prosthetic wear tolerance (days/week)  daily   Current prosthetic wear tolerance (#hours/day)  4 hrs 2x/day; PT recommended to increase to 5 hrs 2x/day   Edema non-pitting   Residual limb condition  no open areas, cylinderical, minimal hair growth, normal color, temperature & moisture   Education Provided Skin check;Residual limb care;Prosthetic cleaning;Correct ply sock adjustment;Proper Donning;Proper wear schedule/adjustment;Proper weight-bearing schedule/adjustment  see prosthetic care   Person(s) Educated Patient   Education Method Explanation;Demonstration;Tactile cues;Verbal cues   Education Method Verbalized understanding;Returned demonstration;Tactile cues required;Verbal cues required;Needs further instruction                PT Education - 03/24/16 0920    Education provided Yes   Education Details seated 4 point sequencing exercise   Person(s) Educated Patient   Methods Explanation;Demonstration;Tactile cues;Verbal cues;Other (comment)  numbered cards   Comprehension Verbalized understanding;Returned demonstration;Verbal cues required;Tactile cues required;Need further instruction          PT Short Term Goals - 03/20/16 1201    PT SHORT TERM GOAL #1   Title Patient tolerates wear of prosthesis >10hrs total /day without skin issues or limb pain. (Target Date: 04/18/2016)   Time 4   Period Weeks   Status New   PT SHORT TERM GOAL #2   Title Patient demonstrates proper donning & verbalizes proper cleaning of prosthesis.  (Target  Date: 04/18/2016)   Time 4   Period Weeks   Status New   PT SHORT TERM GOAL #3   Title Patient ambulates 200' with crutches & prosthesis safely with cues only for deviations not physical assist.  (Target Date: 04/18/2016)   Time 4   Period Weeks   Status New   PT SHORT TERM GOAL #4   Title Patient negotiates ramps, curbs and stairs (1 rail) with crutches with supervision.  (Target Date: 04/18/2016)   Time 4   Period Weeks   Status New   PT SHORT TERM GOAL #5   Title Patient able to reach 7" anteriorly, laterally, across midline and pick up object from floor with prosthesis with supervision.  (Target Date: 04/18/2016)   Time 4   Period Weeks   Status New           PT Long Term Goals - 03/20/16 1154    PT LONG TERM GOAL #1   Title Patient verbalizes & demonstrates understanding of prosthetic care to enable safe use of prosthesis. (Target  Date: 05/16/2016)   Time 8   Period Weeks   Status New   PT LONG TERM GOAL #2   Title Patient tolerates wear of prosthesis >90% of awake hours without skin issues or limb tenderness to enable function throughout his awake hours.  (Target Date: 05/16/2016)   Time 8   Period Weeks   Status New   PT LONG TERM GOAL #3   Title Berg Balance Test > 45/56 to indicate lower fall risk.  (Target Date: 05/16/2016)   Time 8   Period Weeks   Status New   PT LONG TERM GOAL #4   Title Patient ambulates 500' with LRAD & prosthesis outside including grass, ramps & curbs modified independent for community access.  (Target Date: 05/16/2016)   Time 8   Period Weeks   Status New   PT LONG TERM GOAL #5   Title Patient ambulates around furniture carrrying plate & cup with only prosthesis independently.  (Target Date: 05/16/2016)   Time 8   Period Weeks   Status New   Additional Long Term Goals   Additional Long Term Goals Yes   PT LONG TERM GOAL #6   Title Patient able to transfer on/off ground and demonstrate yard work and golf swing with prosthesis modified  independent to return to leisure activities.  (Target Date: 05/16/2016)   Time 8   Period Weeks   Status New               Plan - 03/24/16 1355    Clinical Impression Statement Patient improved prosthetic knee control with proper weight shift & step length instruction. Pateint has balance deficits with difficulty transition sit to/from stand without use of UEs to stablize.    Rehab Potential Good   PT Frequency 2x / week   PT Duration 8 weeks   PT Treatment/Interventions ADLs/Self Care Home Management;DME Instruction;Gait training;Stair training;Functional mobility training;Therapeutic activities;Therapeutic exercise;Balance training;Neuromuscular re-education;Patient/family education;Prosthetic Training   PT Next Visit Plan HEP for mid-line & balance, gait with prosthesis & crutches   Consulted and Agree with Plan of Care Patient      Patient will benefit from skilled therapeutic intervention in order to improve the following deficits and impairments:  Abnormal gait, Decreased activity tolerance, Decreased balance, Decreased endurance, Decreased mobility, Postural dysfunction, Prosthetic Dependency  Visit Diagnosis: Other abnormalities of gait and mobility  Unsteadiness on feet  Other symptoms and signs involving the musculoskeletal system     Problem List Patient Active Problem List   Diagnosis Date Noted  . Type 2 diabetes with complication ([place]) [id number]  . Phantom pain after amputation of lower extremity ([place]) 01/18/2016  . Neuropathic pain   . Above knee amputation of right lower extremity ([place]) 12/29/2015  . PAD (peripheral artery disease) ([place]) 12/15/2015  . Hyperlipidemia associated with type 2 diabetes mellitus ([place]) 12/07/2015  . Hypertension associated with diabetes ([place]) 12/04/2014    Aitana Burry PT, DPT 03/24/2016, 1:57 PM  [facility] [address] [place], [place], [address] Phone:  [phone]   Fax:  [phone]  Name: SOMTOCHUKWU HIRACHETA MRN: [id number] Date of Birth: 1951/10/20

## 2016-03-26 ENCOUNTER — Ambulatory Visit: Payer: Medicare Other | Admitting: Physical Therapy

## 2016-03-26 ENCOUNTER — Encounter: Payer: Self-pay | Admitting: Physical Therapy

## 2016-03-26 DIAGNOSIS — R2689 Other abnormalities of gait and mobility: Secondary | ICD-10-CM | POA: Diagnosis not present

## 2016-03-26 DIAGNOSIS — R29898 Other symptoms and signs involving the musculoskeletal system: Secondary | ICD-10-CM

## 2016-03-26 DIAGNOSIS — R2681 Unsteadiness on feet: Secondary | ICD-10-CM | POA: Diagnosis not present

## 2016-03-26 DIAGNOSIS — Z89611 Acquired absence of right leg above knee: Secondary | ICD-10-CM | POA: Diagnosis not present

## 2016-03-26 NOTE — Patient Instructions (Signed)
Do each exercise 1-2  times per day Do each exercise 10 repetitions Hold each exercise for 5 seconds to feel your location  AT SINK FIND YOUR MIDLINE POSITION AND PLACE FEET EQUAL DISTANCE FROM THE MIDLINE.  USE TAPE ON FLOOR TO MARK THE MIDLINE POSITION. You also should try to feel with your limb pressure in socket.  You are trying to feel with limb what you used to feel with the bottom of your foot.  1. Side to Side Shift: Moving your hips only (not shoulders): move weight onto your left leg, HOLD/FEEL.  Move back to equal weight on each leg, HOLD/FEEL. Move weight onto your right leg, HOLD/FEEL. Move back to equal weight on each leg, HOLD/FEEL. Repeat. 2. Front to Back Shift: Moving your hips only (not shoulders): move your weight forward onto your toes, HOLD/FEEL. Move your weight back to equal Flat Foot on both legs, HOLD/FEEL. Move your weight back onto your heels, HOLD/FEEL. Move your weight back to equal on both legs, HOLD/FEEL. Repeat. 3. Moving Cones / Cups: With equal weight on each leg: Hold on with one hand the first time, then progress to no hand supports. Move cups from one side of sink to the other. Place cups ~2" out of your reach, progress to 10" beyond reach. 4. Overhead/Upward Reaching: alternated reaching up to top cabinets or ceiling if no cabinets present. Keep equal weight on each leg. Start with one hand support on counter while other hand reaches and progress to no hand support with reaching. 5.   Looking Over Shoulders: With equal weight on each leg: alternate turning to look          over your shoulders with one hand support on counter as needed. Shift weight to             side looking, pull hip then shoulder then head/eyes around to look behind you. Start       with one hand support & progress to no hand support. 

## 2016-03-26 NOTE — Therapy (Signed)
[facility] [address] [place], [place], [address] Phone: [phone]   Fax:  [phone]  Physical Therapy Treatment  Patient Details  Name: LIM JANET MRN: [id number] Date of Birth: 1951/11/03 Referring Provider: [name], MD  Encounter Date: 03/26/2016      PT End of Session - 03/26/16 0938    Visit Number 3   Number of Visits 17   Date for PT Re-Evaluation 05/19/16   Authorization Type Medicare G-code & progress note   PT Start Time 0933   PT Stop Time 1015   PT Time Calculation (min) 42 min   Equipment Utilized During Treatment Gait belt   Activity Tolerance Patient tolerated treatment well   Behavior During Therapy [facility] for tasks assessed/performed      Past Medical History  Diagnosis Date  . Hypertension   . Peripheral vascular disease ([place])   . Gangrene ([place]) 11/22/2015    right great toe  . OSA on CPAP   . Type II diabetes mellitus ([place]) dx'd 11/2015  . PAD (peripheral artery disease) [facility])     Past Surgical History  Procedure Laterality Date  . Peripheral vascular catheterization N/A 11/26/2015    Procedure: Abdominal Aortogram;  Surgeon: [name], MD;  Location: [place] [facility];  Service: Cardiovascular;  Laterality: N/A;  . Vasectomy    . Colonoscopy    . Femoral-popliteal bypass graft Right 11/28/2015    Procedure: RIGHT  FEMORAL-POPLITEAL ARTERY BYPASS GRAFT AND CONSTRUCTION OF MILLER CUFF USING 6 MM X 80 CM PROPATEN  GRAFT ;  Surgeon: Conrad Conneaut, MD;  Location: [place];  Service: Vascular;  Laterality: Right;  . Amputation Right 12/01/2015    Procedure: AMPUTATION RIGHT GREAT;  Surgeon: Conrad [place], MD;  Location: [place];  Service: Vascular;  Laterality: Right;  . Amputation Right 12/16/2015    Procedure: AMPUTATION BELOW KNEE;  Surgeon: [name], MD;  Location: [place];  Service: Vascular;  Laterality: Right;  . Amputation Right 12/26/2015    Procedure: RIGHT ABOVE KNEE  AMPUTATION;  Surgeon: [name], MD;  Location: [place];  Service: Vascular;  Laterality: Right;  . Inguinal hernia repair Bilateral 2000    There were no vitals filed for this visit.      Subjective Assessment - 03/26/16 0936    Subjective No new complaints. No falls or pain to report. Does have phantom pain at times at night, taking gabapentin for this. Saw Bobby yesterday. He made the prosthetic side longer.    Pertinent History DM, HTN   Limitations Lifting;Standing;Walking   Patient Stated Goals Use prosthesis to play golf, go to beach house including boating, yardwork   Currently in Pain? No/denies             [facility] Adult PT Treatment/Exercise - 03/26/16 0939    Transfers   Transfers Sit to Stand;Stand to Sit   Sit to Stand 4: Min guard;With upper extremity assist;From chair/3-in-1   Sit to Stand Details Verbal cues for precautions/safety;Verbal cues for safe use of DME/AE;Verbal cues for technique;Verbal cues for sequencing   Sit to Stand Details (indicate cue type and reason) cues needed on use of prosthetic knee/knee control and on hand positioning/crutch positioning   Stand to Sit 5: Supervision;With upper extremity assist;To chair/3-in-1   Stand to Sit Details (indicate cue type and reason) Verbal cues for sequencing;Verbal cues for precautions/safety;Verbal cues for safe use of DME/AE   Stand to Sit Details  cues to turn all the way to surface, for crutch use/placement and technique to unlock knee prior to sitting down   Ambulation/Gait   Ambulation/Gait Yes   Ambulation/Gait Assistance 4: Min guard   Ambulation/Gait Assistance Details cues on step length, weight shifting and prosthetic knee use/control. all of this improved with use of 4 point gait pattern (pt needed mod cues for sequencing with 4 point gait pattern)   Ambulation Distance (Feet) 100 Feet  x2, 120 x1 with loftstrands, 100 with axl using 4 pt pattern   Assistive device Crutches;Prosthesis;Lofstrands    Gait Pattern Step-to pattern;Decreased step length - left;Decreased stance time - right;Decreased stride length;Decreased hip/knee flexion - right;Decreased weight shift to right;Right hip hike;Right circumduction;Right flexed knee in stance;Trunk flexed;Abducted- right;Poor foot clearance - right   Ambulation Surface Level;Indoor   Gait Comments pt to work on "hand bell" activity at home over weekend to help with carryover of 4 point gait pattern.   Prosthetics   Prosthetic Care Comments  discuessed importance of consistent wear with gradual build up.                                                Current prosthetic wear tolerance (days/week)  daily   Current prosthetic wear tolerance (#hours/day)  currently wearing for 4 hours total a day, not 2x a day.    Residual limb condition  intact per pt report   Education Provided Proper wear schedule/adjustment;Proper weight-bearing schedule/adjustment   Person(s) Educated Patient   Education Method Explanation;Demonstration;Verbal cues;Handout   Education Method Verbalized understanding;Returned demonstration;Verbal cues required;Needs further instruction           PT Education - 03/26/16 0959    Education provided Yes   Education Details HEP: sink HEP for balance and proprioception   Person(s) Educated Patient   Methods Explanation;Demonstration;Verbal cues;Handout;Tactile cues   Comprehension Verbalized understanding;Returned demonstration;Need further instruction          PT Short Term Goals - 03/20/16 1201    PT SHORT TERM GOAL #1   Title Patient tolerates wear of prosthesis >10hrs total /day without skin issues or limb pain. (Target Date: 04/18/2016)   Time 4   Period Weeks   Status New   PT SHORT TERM GOAL #2   Title Patient demonstrates proper donning & verbalizes proper cleaning of prosthesis.  (Target Date: 04/18/2016)   Time 4   Period Weeks   Status New   PT SHORT TERM GOAL #3   Title Patient ambulates 200' with crutches &  prosthesis safely with cues only for deviations not physical assist.  (Target Date: 04/18/2016)   Time 4   Period Weeks   Status New   PT SHORT TERM GOAL #4   Title Patient negotiates ramps, curbs and stairs (1 rail) with crutches with supervision.  (Target Date: 04/18/2016)   Time 4   Period Weeks   Status New   PT SHORT TERM GOAL #5   Title Patient able to reach 7" anteriorly, laterally, across midline and pick up object from floor with prosthesis with supervision.  (Target Date: 04/18/2016)   Time 4   Period Weeks   Status New           PT Long Term Goals - 03/20/16 1154    PT LONG TERM GOAL #1   Title Patient verbalizes & demonstrates understanding of  prosthetic care to enable safe use of prosthesis. (Target Date: 05/16/2016)   Time 8   Period Weeks   Status New   PT LONG TERM GOAL #2   Title Patient tolerates wear of prosthesis >90% of awake hours without skin issues or limb tenderness to enable function throughout his awake hours.  (Target Date: 05/16/2016)   Time 8   Period Weeks   Status New   PT LONG TERM GOAL #3   Title Berg Balance Test > 45/56 to indicate lower fall risk.  (Target Date: 05/16/2016)   Time 8   Period Weeks   Status New   PT LONG TERM GOAL #4   Title Patient ambulates 500' with LRAD & prosthesis outside including grass, ramps & curbs modified independent for community access.  (Target Date: 05/16/2016)   Time 8   Period Weeks   Status New   PT LONG TERM GOAL #5   Title Patient ambulates around furniture carrrying plate & cup with only prosthesis independently.  (Target Date: 05/16/2016)   Time 8   Period Weeks   Status New   Additional Long Term Goals   Additional Long Term Goals Yes   PT LONG TERM GOAL #6   Title Patient able to transfer on/off ground and demonstrate yard work and golf swing with prosthesis modified independent to return to leisure activities.  (Target Date: 05/16/2016)   Time 8   Period Weeks   Status New           Plan -  03/26/16 [id number]    Clinical Impression Statement Today's session focused on initiation of HEP at sink for balance and proprioception. Also continued to work on Actor and training with gait. Pt continues to need cues for prosthetic knee use with transfers/gait and for general safety at times (mostly with transfers). Trialed gait with forearm crutches as pt states he has those at home. Pt was able to best achieve a good gait pattern with prosthetic  knee use/control useing 4 point gait pattern at end of session. Pt is to work on this pattern at home. Pt also has only been wearing the prosthesis 1 time a day for 4 hours total . After discussion on  importance of gradual builing of wear times and consistency of wear times pt is to wear prosthesis for 4 hours 2x a day (total of 8 hours). If there are no issues with this next visit (next week) will plan to increase wear time to 5 hours 2x day at that time.    Rehab Potential Good   PT Frequency 2x / week   PT Duration 8 weeks   PT Treatment/Interventions ADLs/Self Care Home Management;DME Instruction;Gait training;Stair training;Functional mobility training;Therapeutic activities;Therapeutic exercise;Balance training;Neuromuscular re-education;Patient/family education;Prosthetic Training   PT Next Visit Plan , gait with prosthesis & crutches with emphasis on 4 point gait pattern and prosthetic knee use/control with gait. increase wear time to 5 hours 2x day if no issues with current wear time.   Consulted and Agree with Plan of Care Patient      Patient will benefit from skilled therapeutic intervention in order to improve the following deficits and impairments:  Abnormal gait, Decreased activity tolerance, Decreased balance, Decreased endurance, Decreased mobility, Postural dysfunction, Prosthetic Dependency  Visit Diagnosis: Other abnormalities of gait and mobility  Unsteadiness on feet  Other symptoms and signs involving the  musculoskeletal system     Problem List Patient Active Problem List   Diagnosis Date Noted  . Type  2 diabetes with complication ([place]) [id number]  . Phantom pain after amputation of lower extremity ([place]) 01/18/2016  . Neuropathic pain   . Above knee amputation of right lower extremity ([place]) 12/29/2015  . PAD (peripheral artery disease) ([place]) 12/15/2015  . Hyperlipidemia associated with type 2 diabetes mellitus ([place]) 12/07/2015  . Hypertension associated with diabetes ([place]) 12/04/2014    Willow Ora, PTA, [facility] [address], [place], [place] [address] [phone] 03/26/2016, 10:56 AM   Name: JONITHAN MEDEARIS MRN: [id number] Date of Birth: [birth year]

## 2016-03-31 ENCOUNTER — Ambulatory Visit: Payer: Medicare Other | Admitting: Physical Therapy

## 2016-03-31 DIAGNOSIS — R2681 Unsteadiness on feet: Secondary | ICD-10-CM

## 2016-03-31 DIAGNOSIS — R29898 Other symptoms and signs involving the musculoskeletal system: Secondary | ICD-10-CM | POA: Diagnosis not present

## 2016-03-31 DIAGNOSIS — R2689 Other abnormalities of gait and mobility: Secondary | ICD-10-CM | POA: Diagnosis not present

## 2016-03-31 DIAGNOSIS — S78111A Complete traumatic amputation at level between right hip and knee, initial encounter: Secondary | ICD-10-CM

## 2016-03-31 DIAGNOSIS — Z89611 Acquired absence of right leg above knee: Secondary | ICD-10-CM | POA: Diagnosis not present

## 2016-03-31 NOTE — Therapy (Signed)
[facility] [address] [place], [place], [address] Phone: [phone]   Fax:  [phone]  Physical Therapy Treatment  Patient Details  Name: Adam [name] MRN: [id number] Date of Birth: 1951-02-11 Referring Provider: [name], MD  Encounter Date: 03/31/2016      PT End of Session - 03/31/16 1108    Visit Number 4   Number of Visits 17   Date for PT Re-Evaluation 05/19/16   Authorization Type Medicare G-code & progress note   PT Start Time 1101   PT Stop Time 1147   PT Time Calculation (min) 46 min   Equipment Utilized During Treatment Gait belt   Activity Tolerance Patient tolerated treatment well;No increased pain   Behavior During Therapy [facility] for tasks assessed/performed      Past Medical History  Diagnosis Date  . Hypertension   . Peripheral vascular disease ([place])   . Gangrene ([place]) 11/22/2015    right great toe  . OSA on CPAP   . Type II diabetes mellitus ([place]) dx'd 11/2015  . PAD (peripheral artery disease) [facility])     Past Surgical History  Procedure Laterality Date  . Peripheral vascular catheterization N/A 11/26/2015    Procedure: Abdominal Aortogram;  Surgeon: [name], MD;  Location: [place] [facility];  Service: Cardiovascular;  Laterality: N/A;  . Vasectomy    . Colonoscopy    . Femoral-popliteal bypass graft Right 11/28/2015    Procedure: RIGHT  FEMORAL-POPLITEAL ARTERY BYPASS GRAFT AND CONSTRUCTION OF MILLER CUFF USING 6 MM X 80 CM PROPATEN  GRAFT ;  Surgeon: Conrad Wapella, MD;  Location: [place];  Service: Vascular;  Laterality: Right;  . Amputation Right 12/01/2015    Procedure: AMPUTATION RIGHT GREAT;  Surgeon: Conrad , MD;  Location: [place];  Service: Vascular;  Laterality: Right;  . Amputation Right 12/16/2015    Procedure: AMPUTATION BELOW KNEE;  Surgeon: [name], MD;  Location: [place];  Service: Vascular;  Laterality: Right;  . Amputation Right 12/26/2015    Procedure:  RIGHT ABOVE KNEE AMPUTATION;  Surgeon: [name], MD;  Location: [place];  Service: Vascular;  Laterality: Right;  . Inguinal hernia repair Bilateral 2000    There were no vitals filed for this visit.      Subjective Assessment - 03/31/16 1104    Subjective One fall at night without prothesis on, pt tripped over bag. Wearing prothesis 5-6 hours a day x2. Does not have phantom pain at this time, that depends on time and day   Pertinent History DM, HTN   Limitations Lifting;Standing;Walking   Patient Stated Goals Use prosthesis to play golf, go to beach house including boating, yardwork   Currently in Pain? No/denies                         [facility] Adult PT Treatment/Exercise - 03/31/16 1107    Transfers   Transfers Sit to Stand;Stand to Sit   Sit to Stand 4: Min guard;From chair/3-in-1   Sit to Stand Details Verbal cues for precautions/safety;Verbal cues for safe use of DME/AE;Verbal cues for technique;Verbal cues for sequencing   Sit to Stand Details (indicate cue type and reason) verbal cuing and demo for keeping heel back to lock knee. multiple reps with anterior support w/ focus to minimize UE support to stabilize  verbal & visual cues on technique fo prosthetic knee control   Stand to Sit 5: Supervision;With  upper extremity assist;To chair/3-in-1   Stand to Sit Details (indicate cue type and reason) Verbal cues for sequencing;Verbal cues for precautions/safety;Verbal cues for safe use of DME/AE   Stand to Sit Details cues for UE use when lowering & foot position to unlock prosthetic knee. multiple reps with anterior support w/ focus to minimize UE support to stabilize   Ambulation/Gait   Ambulation/Gait Yes   Ambulation/Gait Assistance 5: Supervision   Ambulation/Gait Assistance Details Cues for achieving smooth toe off and sequencing equal step length   attempted 4-pt pattern but pt had difficulty sequencing   Ambulation Distance (Feet) 460 Feet  115 x4 with  crutches, 3 point pattern    Assistive device Crutches;Prosthesis;L Axillary Crutch;R Axillary Crutch   Gait Pattern Decreased step length - left;Decreased stride length;Decreased hip/knee flexion - right;Decreased weight shift to right;Right hip hike;Trunk flexed;Abducted- right;Poor foot clearance - right;Decreased stance time - right   Ambulation Surface Indoor;Level   Stairs Assistance 5: Supervision;4: Min guard   Stairs Assistance Details (indicate cue type and reason) cues for clearing prosthesis to minimize potential for buckling   Stair Management Technique One rail Right;One rail Left  One rail and axillary crutch    Number of Stairs 4   Ramp 4: Min assist;5: Supervision   Ramp Details (indicate cue type and reason) verbal cuing and demo for locking/unlocking knee during descending ramp   Curb 5: Supervision  axillary crutches   Curb Details (indicate cue type and reason) cues for axillary crutch and prothesis management    Gait Comments --   Therapeutic Activites    Therapeutic Activities --   Prosthetics   Prosthetic Care Comments  increased wear time, review of drying prothesis when signs of sweating   Current prosthetic wear tolerance (days/week)  daily   Current prosthetic wear tolerance (#hours/day)  currently wearing for 4 hours total a day, not 2x a day.    Current prosthetic weight-bearing tolerance (hours/day)  turning in both directions with cuing for increasing weight on prothesis   Residual limb condition  intact per pt report   Education Provided Proper wear schedule/adjustment;Proper weight-bearing schedule/adjustment   Person(s) Educated Patient   Education Method Explanation;Demonstration   Education Method Verbalized understanding;Returned demonstration                PT Education - 03/31/16 1111    Education provided Yes   Education Details increasing level of activity throughout the day   Person(s) Educated Patient   Methods  Explanation;Demonstration   Comprehension Verbalized understanding          PT Short Term Goals - 03/20/16 1201    PT SHORT TERM GOAL #1   Title Patient tolerates wear of prosthesis >10hrs total /day without skin issues or limb pain. (Target Date: 04/18/2016)   Time 4   Period Weeks   Status New   PT SHORT TERM GOAL #2   Title Patient demonstrates proper donning & verbalizes proper cleaning of prosthesis.  (Target Date: 04/18/2016)   Time 4   Period Weeks   Status New   PT SHORT TERM GOAL #3   Title Patient ambulates 200' with crutches & prosthesis safely with cues only for deviations not physical assist.  (Target Date: 04/18/2016)   Time 4   Period Weeks   Status New   PT SHORT TERM GOAL #4   Title Patient negotiates ramps, curbs and stairs (1 rail) with crutches with supervision.  (Target Date: 04/18/2016)   Time 4  Period Weeks   Status New   PT SHORT TERM GOAL #5   Title Patient able to reach 7" anteriorly, laterally, across midline and pick up object from floor with prosthesis with supervision.  (Target Date: 04/18/2016)   Time 4   Period Weeks   Status New           PT Long Term Goals - 03/20/16 1154    PT LONG TERM GOAL #1   Title Patient verbalizes & demonstrates understanding of prosthetic care to enable safe use of prosthesis. (Target Date: 05/16/2016)   Time 8   Period Weeks   Status New   PT LONG TERM GOAL #2   Title Patient tolerates wear of prosthesis >90% of awake hours without skin issues or limb tenderness to enable function throughout his awake hours.  (Target Date: 05/16/2016)   Time 8   Period Weeks   Status New   PT LONG TERM GOAL #3   Title Berg Balance Test > 45/56 to indicate lower fall risk.  (Target Date: 05/16/2016)   Time 8   Period Weeks   Status New   PT LONG TERM GOAL #4   Title Patient ambulates 500' with LRAD & prosthesis outside including grass, ramps & curbs modified independent for community access.  (Target Date: 05/16/2016)   Time 8    Period Weeks   Status New   PT LONG TERM GOAL #5   Title Patient ambulates around furniture carrrying plate & cup with only prosthesis independently.  (Target Date: 05/16/2016)   Time 8   Period Weeks   Status New   Additional Long Term Goals   Additional Long Term Goals Yes   PT LONG TERM GOAL #6   Title Patient able to transfer on/off ground and demonstrate yard work and golf swing with prosthesis modified independent to return to leisure activities.  (Target Date: 05/16/2016)   Time 8   Period Weeks   Status New               Plan - 03/31/16 1347    Clinical Impression Statement Patient progressing to short term goals, needed increased cuing for smooth gait and prothesitic and axillary crutch management on the stairs, ramp and curb. Pt needs demostrates an increase need for cuing with activites and is showing improvements on confidence with weightbearing through prothesis. Patient had difficulty sequencing 4-pt pattern so PT determined more beneficial to continue 3-pt pattern and focus gait on facilitating step thru pattern with equal step length.    Rehab Potential Good   PT Frequency 2x / week   PT Duration 8 weeks   PT Treatment/Interventions ADLs/Self Care Home Management;DME Instruction;Gait training;Stair training;Functional mobility training;Therapeutic activities;Therapeutic exercise;Balance training;Neuromuscular re-education;Patient/family education;Prosthetic Training   PT Next Visit Plan gait on unlevel surfaces, balance activites, stair ramp and curb management   PT Home Exercise Plan sit to stand and turning at counter    Consulted and Agree with Plan of Care Patient      Patient will benefit from skilled therapeutic intervention in order to improve the following deficits and impairments:  Abnormal gait, Decreased activity tolerance, Decreased balance, Decreased endurance, Decreased mobility, Postural dysfunction, Prosthetic Dependency  Visit Diagnosis: Other  abnormalities of gait and mobility  Unsteadiness on feet  Other symptoms and signs involving the musculoskeletal system  Above knee amputation of right lower extremity [facility])     Problem List Patient Active Problem List   Diagnosis Date Noted  . Type 2 diabetes with  complication ([place]) [id number]  . Phantom pain after amputation of lower extremity ([place]) 01/18/2016  . Neuropathic pain   . Above knee amputation of right lower extremity ([place]) 12/29/2015  . PAD (peripheral artery disease) ([place]) 12/15/2015  . Hyperlipidemia associated with type 2 diabetes mellitus ([place]) 12/07/2015  . Hypertension associated with diabetes ([place]) 12/04/2014   Dillard Essex, SPT  03/31/2016, 1:52 PM  Jamey Reas, PT, DPT PT Specializing in [place] 04/01/2016 7:29 AM Phone:  [phone]  Fax:  [phone] [facility] [address] [place], [place] [address]   [facility] [address] [place], [place], [address] Phone: [phone]   Fax:  [phone]  Name: JUNICHI BOAS MRN: [id number] Date of Birth: 1951-11-15

## 2016-04-02 ENCOUNTER — Ambulatory Visit: Payer: Medicare Other | Admitting: Physical Therapy

## 2016-04-02 DIAGNOSIS — R2681 Unsteadiness on feet: Secondary | ICD-10-CM | POA: Diagnosis not present

## 2016-04-02 DIAGNOSIS — Z89611 Acquired absence of right leg above knee: Secondary | ICD-10-CM | POA: Diagnosis not present

## 2016-04-02 DIAGNOSIS — S78111A Complete traumatic amputation at level between right hip and knee, initial encounter: Secondary | ICD-10-CM

## 2016-04-02 DIAGNOSIS — R2689 Other abnormalities of gait and mobility: Secondary | ICD-10-CM | POA: Diagnosis not present

## 2016-04-02 DIAGNOSIS — R29898 Other symptoms and signs involving the musculoskeletal system: Secondary | ICD-10-CM | POA: Diagnosis not present

## 2016-04-02 NOTE — Therapy (Signed)
[facility] [address] [place], [place], [address] Phone: [phone]   Fax:  [phone]  Physical Therapy Treatment  Patient Details  Name: Adam [name] MRN: [id number] Date of Birth: 02-08-1951 Referring Provider: [name], MD  Encounter Date: 04/02/2016      PT End of Session - 04/02/16 1033    Visit Number 5   Number of Visits 17   Date for PT Re-Evaluation 05/19/16   Authorization Type Medicare G-code & progress note   PT Start Time 0932   PT Stop Time 1016   PT Time Calculation (min) 44 min   Equipment Utilized During Treatment Gait belt   Activity Tolerance Patient tolerated treatment well;No increased pain   Behavior During Therapy [facility] for tasks assessed/performed      Past Medical History  Diagnosis Date  . Hypertension   . Peripheral vascular disease ([place])   . Gangrene ([place]) 11/22/2015    right great toe  . OSA on CPAP   . Type II diabetes mellitus ([place]) dx'd 11/2015  . PAD (peripheral artery disease) [facility])     Past Surgical History  Procedure Laterality Date  . Peripheral vascular catheterization N/A 11/26/2015    Procedure: Abdominal Aortogram;  Surgeon: [name], MD;  Location: [place] [facility];  Service: Cardiovascular;  Laterality: N/A;  . Vasectomy    . Colonoscopy    . Femoral-popliteal bypass graft Right 11/28/2015    Procedure: RIGHT  FEMORAL-POPLITEAL ARTERY BYPASS GRAFT AND CONSTRUCTION OF MILLER CUFF USING 6 MM X 80 CM PROPATEN  GRAFT ;  Surgeon: Conrad [place], MD;  Location: [place];  Service: Vascular;  Laterality: Right;  . Amputation Right 12/01/2015    Procedure: AMPUTATION RIGHT GREAT;  Surgeon: Conrad [place], MD;  Location: [place];  Service: Vascular;  Laterality: Right;  . Amputation Right 12/16/2015    Procedure: AMPUTATION BELOW KNEE;  Surgeon: [name], MD;  Location: [place];  Service: Vascular;  Laterality: Right;  . Amputation Right 12/26/2015    Procedure:  RIGHT ABOVE KNEE AMPUTATION;  Surgeon: [name], MD;  Location: [place];  Service: Vascular;  Laterality: Right;  . Inguinal hernia repair Bilateral 2000    There were no vitals filed for this visit.      Subjective Assessment - 04/02/16 0935    Subjective No falls since last seen, saw prosthetists and had adjustment and feels that the knee is "releasing" easier now. No skin issues, currently wearing prosthesis 5 hours 2x a day    Pertinent History DM, HTN   Limitations Lifting;Standing;Walking   Currently in Pain? No/denies                         [facility] Adult PT Treatment/Exercise - 04/02/16 0930    Transfers   Transfers Sit to Stand;Stand to Sit   Sit to Stand 5: Supervision;With upper extremity assist;From chair/3-in-1  use of axillary crutches   Sit to Stand Details Verbal cues for precautions/safety;Verbal cues for safe use of DME/AE;Verbal cues for technique;Verbal cues for sequencing   Sit to Stand Details (indicate cue type and reason) verbal cues for scooting to front of seat before standing up   Stand to Sit 5: Supervision;With upper extremity assist;To chair/3-in-1  use of axillary crutches   Stand to Sit Details (indicate cue type and reason) --   Ambulation/Gait   Ambulation/Gait Yes   Ambulation/Gait Assistance 5: Supervision  Ambulation/Gait Assistance Details cues for equalizing step length, verbal and tactile cues for anteior pelvic rotation on RLE to advance prosthesis, lightly touching heel down at intial contact to decrease forceful impact with ground   Ambulation Distance (Feet) 600 Feet  3 point pattern    Assistive device Crutches;Prosthesis;L Axillary Crutch;R Axillary Crutch   Gait Pattern Decreased step length - left;Decreased stride length;Decreased hip/knee flexion - right;Decreased weight shift to right;Trunk flexed;Poor foot clearance - right;Decreased stance time - right;Scissoring   Ambulation Surface Level;Indoor   Stairs  Assistance 4: Min guard;5: Supervision  supervision with railing, Min guard with no rail   Stairs Assistance Details (indicate cue type and reason) PT demo and instruction on technique with axillary crutches and prosthetic knee control    Stair Management Technique One rail Left;With crutches;Step to pattern;Forwards  4 steps with 1 rail / 1 crutch;  2 steps w/ crutches no rail   Number of Stairs 4  2 with no rails   Ramp 4: Min assist  axillary crutches & prosthesis   Ramp Details (indicate cue type and reason) verbal cuing for taking smaller steps and shifting weight onto prosthesis in stance   Curb 5: Supervision  axillary crutches & prosthesis   Curb Details (indicate cue type and reason) cues on sequence   High Level Balance   High Level Balance Activities --   High Level Balance Comments --   Neuro Re-ed    Neuro Re-ed Details  proprioception cues to use residual limb to sense / feel weight distribution. Tactile, visual & verbal cues on center of gravity midline, lateral weight shifts & anterior/ posterior weight shifts.    Prosthetics   Prosthetic Care Comments  increased wear time to all awake hours except 2 hrs mid-day   Current prosthetic wear tolerance (days/week)  daily   Current prosthetic wear tolerance (#hours/day)  wearing prosthesis 5hrs 2x/day   Current prosthetic weight-bearing tolerance (hours/day)  tolerated standing & gait >15 minutes with no c/o pain or discomfort   Residual limb condition  no issues per patient   Education Provided Proper wear schedule/adjustment;Proper weight-bearing schedule/adjustment  tightening suspension strap   Person(s) Educated Patient   Education Method Explanation;Demonstration;Tactile cues;Verbal cues   Education Method Verbalized understanding;Returned demonstration;Tactile cues required;Verbal cues required;Needs further instruction                PT Education - 04/02/16 1032    Education Details HEP to work on  [place] at the [employer]) Educated Patient   Methods Demonstration;Explanation   Comprehension Verbalized understanding;Returned demonstration          PT Short Term Goals - 04/02/16 2213    PT SHORT TERM GOAL #1   Title Patient tolerates wear of prosthesis >10hrs total /day without skin issues or limb pain. (Target Date: 04/18/2016)   Time 4   Period Weeks   Status On-going   PT SHORT TERM GOAL #2   Title Patient demonstrates proper donning & verbalizes proper cleaning of prosthesis.  (Target Date: 04/18/2016)   Time 4   Period Weeks   Status On-going   PT SHORT TERM GOAL #3   Title Patient ambulates 200' with crutches & prosthesis safely with cues only for deviations not physical assist.  (Target Date: 04/18/2016)   Time 4   Period Weeks   Status On-going   PT SHORT TERM GOAL #4   Title Patient negotiates ramps, curbs and stairs (1 rail) with crutches with supervision.  (Target  Date: 04/18/2016)   Time 4   Period Weeks   Status On-going   PT SHORT TERM GOAL #5   Title Patient able to reach 7" anteriorly, laterally, across midline and pick up object from floor with prosthesis with supervision.  (Target Date: 04/18/2016)   Time 4   Period Weeks   Status On-going           PT Long Term Goals - 04/02/16 2213    PT LONG TERM GOAL #1   Title Patient verbalizes & demonstrates understanding of prosthetic care to enable safe use of prosthesis. (Target Date: 05/16/2016)   Time 8   Period Weeks   Status On-going   PT LONG TERM GOAL #2   Title Patient tolerates wear of prosthesis >90% of awake hours without skin issues or limb tenderness to enable function throughout his awake hours.  (Target Date: 05/16/2016)   Time 8   Period Weeks   Status On-going   PT LONG TERM GOAL #3   Title Berg Balance Test > 45/56 to indicate lower fall risk.  (Target Date: 05/16/2016)   Time 8   Period Weeks   Status On-going   PT LONG TERM GOAL #4   Title Patient ambulates 500' with LRAD &  prosthesis outside including grass, ramps & curbs modified independent for community access.  (Target Date: 05/16/2016)   Time 8   Period Weeks   Status On-going   PT LONG TERM GOAL #5   Title Patient ambulates around furniture carrrying plate & cup with only prosthesis independently.  (Target Date: 05/16/2016)   Time 8   Period Weeks   Status On-going   PT LONG TERM GOAL #6   Title Patient able to transfer on/off ground and demonstrate yard work and golf swing with prosthesis modified independent to return to leisure activities.  (Target Date: 05/16/2016)   Time 8   Period Weeks   Status On-going               Plan - 04/02/16 1034    Clinical Impression Statement Patient requires increased verbal and tactile cuing for to promote equal weightbearing through prosthesis and LLE, gait using 3 point pattern with axillary crutches is improving in gait mechanics but still requires cuing for pelvic movement and prosthesis managment during swing phase, for ramp and curb management patient reuires heavy cuing for a shorter step length, and feedback for increased confidence in movements.     Rehab Potential Good   PT Frequency 2x / week   PT Duration 8 weeks   PT Treatment/Interventions ADLs/Self Care Home Management;DME Instruction;Gait training;Stair training;Functional mobility training;Therapeutic activities;Therapeutic exercise;Balance training;Neuromuscular re-education;Patient/family education;Prosthetic Training   PT Next Visit Plan carry over with gait and weightshifts onto prosthesis, gait on unlevel surfaces   PT Home Exercise Plan weightshifting lateral and anterior/posterior at counter   Consulted and Agree with Plan of Care Patient      Patient will benefit from skilled therapeutic intervention in order to improve the following deficits and impairments:  Abnormal gait, Decreased activity tolerance, Decreased balance, Decreased endurance, Decreased mobility, Postural dysfunction,  Prosthetic Dependency  Visit Diagnosis: Other abnormalities of gait and mobility  Unsteadiness on feet  Other symptoms and signs involving the musculoskeletal system  Above knee amputation of right lower extremity [facility])     Problem List Patient Active Problem List   Diagnosis Date Noted  . Type 2 diabetes with complication ([place]) [id number]  . Phantom pain after amputation of lower extremity ([place])  01/18/2016  . Neuropathic pain   . Above knee amputation of right lower extremity ([place]) 12/29/2015  . PAD (peripheral artery disease) ([place]) 12/15/2015  . Hyperlipidemia associated with type 2 diabetes mellitus ([place]) 12/07/2015  . Hypertension associated with diabetes ([place]) 12/04/2014    Dillard Essex, SPT 04/02/2016, 10:44 AM   Jamey Reas, PT, DPT PT Specializing in [place] 04/02/2016 10:14 PM Phone:  [phone]  Fax:  [phone] [place] [address] [place], [place] [address]   [facility] [address] [place], [place], [address] Phone: [phone]   Fax:  [phone]  Name: Adam [name] MRN: [id number] Date of Birth: 1951/05/10

## 2016-04-07 ENCOUNTER — Ambulatory Visit: Payer: Medicare Other | Admitting: Physical Therapy

## 2016-04-07 DIAGNOSIS — S78111A Complete traumatic amputation at level between right hip and knee, initial encounter: Secondary | ICD-10-CM

## 2016-04-07 DIAGNOSIS — R2689 Other abnormalities of gait and mobility: Secondary | ICD-10-CM

## 2016-04-07 DIAGNOSIS — R2681 Unsteadiness on feet: Secondary | ICD-10-CM | POA: Diagnosis not present

## 2016-04-07 DIAGNOSIS — Z89611 Acquired absence of right leg above knee: Secondary | ICD-10-CM | POA: Diagnosis not present

## 2016-04-07 DIAGNOSIS — R29898 Other symptoms and signs involving the musculoskeletal system: Secondary | ICD-10-CM | POA: Diagnosis not present

## 2016-04-07 NOTE — Therapy (Addendum)
[facility] [address] [place], [place], [address] Phone: [phone]   Fax:  [phone]  Physical Therapy Treatment  Patient Details  Name: Adam [name] MRN: [id number] Date of Birth: 11-27-1950 Referring Provider: [name], MD  Encounter Date: 04/07/2016      PT End of Session - 04/07/16 1358    Visit Number 6   Number of Visits 17   Date for PT Re-Evaluation 05/19/16   Authorization Type Medicare G-code & progress note   PT Start Time 1017   PT Stop Time 1100   PT Time Calculation (min) 43 min   Equipment Utilized During Treatment Gait belt   Activity Tolerance Patient tolerated treatment well;No increased pain   Behavior During Therapy [facility] for tasks assessed/performed      Past Medical History  Diagnosis Date  . Hypertension   . Peripheral vascular disease ([place])   . Gangrene ([place]) 11/22/2015    right great toe  . OSA on CPAP   . Type II diabetes mellitus ([place]) dx'd 11/2015  . PAD (peripheral artery disease) [facility])     Past Surgical History  Procedure Laterality Date  . Peripheral vascular catheterization N/A 11/26/2015    Procedure: Abdominal Aortogram;  Surgeon: [name], MD;  Location: [place] [facility];  Service: Cardiovascular;  Laterality: N/A;  . Vasectomy    . Colonoscopy    . Femoral-popliteal bypass graft Right 11/28/2015    Procedure: RIGHT  FEMORAL-POPLITEAL ARTERY BYPASS GRAFT AND CONSTRUCTION OF MILLER CUFF USING 6 MM X 80 CM PROPATEN  GRAFT ;  Surgeon: Conrad Macks Creek, MD;  Location: [place];  Service: Vascular;  Laterality: Right;  . Amputation Right 12/01/2015    Procedure: AMPUTATION RIGHT GREAT;  Surgeon: Conrad Chesnee, MD;  Location: [place];  Service: Vascular;  Laterality: Right;  . Amputation Right 12/16/2015    Procedure: AMPUTATION BELOW KNEE;  Surgeon: [name], MD;  Location: [place];  Service: Vascular;  Laterality: Right;  . Amputation Right 12/26/2015    Procedure:  RIGHT ABOVE KNEE AMPUTATION;  Surgeon: [name], MD;  Location: [place];  Service: Vascular;  Laterality: Right;  . Inguinal hernia repair Bilateral 2000    There were no vitals filed for this visit.      Subjective Assessment - 04/07/16 1019    Subjective No falls since last visit, No skin issues with wearing all day except 2 hours midday   Pertinent History DM, HTN   Limitations Lifting;Standing;Walking   Patient Stated Goals Use prosthesis to play golf, go to beach house including boating, yardwork   Currently in Pain? No/denies                         [facility] Adult PT Treatment/Exercise - 04/07/16 1015    Transfers   Transfers Sit to Stand;Stand to Sit   Sit to Stand 5: Supervision;With upper extremity assist;From chair/3-in-1  use of axillary crutches   Sit to Stand Details --   Stand to Sit 5: Supervision;With upper extremity assist;To chair/3-in-1  use of axillary crutches   Ambulation/Gait   Ambulation/Gait Yes   Ambulation/Gait Assistance 5: Supervision;4: Min assist   Ambulation/Gait Assistance Details Patient improving with gait using axillary crutches, needs less cuing and he feels more confident. Progressed to [facility] in LUE & with RUE support on countertop with Mod A needed for balance and prosthesis control, sequencing of SPC.   Ambulation  Distance (Feet) 390 Feet  250 with axillary crutches, 100 and 40 with SPC   Assistive device Prosthesis;L Axillary Crutch;R Axillary Crutch;Crutches;Straight cane;1 person hand held assist   Gait Pattern Decreased step length - left;Decreased stride length;Decreased hip/knee flexion - right;Decreased weight shift to right;Poor foot clearance - right;Decreased stance time - right;Wide base of support   Ambulation Surface Level;Indoor   Stairs Assistance --   Stair Management Technique --   Number of Stairs --   Ramp 5: Supervision  axillary crutches & prosthesis   Ramp Details (indicate cue type and reason) cueing  for prosthetic knee control with incline, verbal cues to unlock knee and not stiff leg.    Curb 5: Supervision  axillary crutches & prosthesis   Neuro Re-ed    Neuro Re-ed Details  Using theraband in UE reaching anterior, posterior and overhead, cues for distrubuting weight evenly through both LE proprioception cues to use residual limb to sense / feel weight distribution. Tactile, visual & verbal cues on center of gravity midline, lateral weight shifts & anterior/ posterior weight shifts.    Prosthetics   Prosthetic Care Comments  increased wear time to all awake hours except 2 hrs mid-day, reviewed signs of sweating   Current prosthetic wear tolerance (days/week)  daily   Current prosthetic wear tolerance (#hours/day)  wearing prosthesis 5 except 2 hours at midday    Current prosthetic weight-bearing tolerance (hours/day)  tolerated standing & gait >15 minutes with no c/o pain or discomfort   Residual limb condition  Pt reports no issues   Education Provided Proper wear schedule/adjustment;Proper weight-bearing schedule/adjustment;Residual limb care  tightening suspension strap   Person(s) Educated Patient   Education Method Explanation   Education Method Verbalized understanding     Gait along counter: side stepping & backwards walking with cues on prosthesis control and weight shift.            PT Education - 04/07/16 1357    Education provided Yes   Education Details Continue to work on [place] and getting even weight distrubution    Person(s) Educated Patient   Methods Explanation;Demonstration   Comprehension Verbalized understanding;Returned demonstration          PT Short Term Goals - 04/02/16 2213    PT SHORT TERM GOAL #1   Title Patient tolerates wear of prosthesis >10hrs total /day without skin issues or limb pain. (Target Date: 04/18/2016)   Time 4   Period Weeks   Status On-going   PT SHORT TERM GOAL #2   Title Patient demonstrates proper donning &  verbalizes proper cleaning of prosthesis.  (Target Date: 04/18/2016)   Time 4   Period Weeks   Status On-going   PT SHORT TERM GOAL #3   Title Patient ambulates 200' with crutches & prosthesis safely with cues only for deviations not physical assist.  (Target Date: 04/18/2016)   Time 4   Period Weeks   Status On-going   PT SHORT TERM GOAL #4   Title Patient negotiates ramps, curbs and stairs (1 rail) with crutches with supervision.  (Target Date: 04/18/2016)   Time 4   Period Weeks   Status On-going   PT SHORT TERM GOAL #5   Title Patient able to reach 7" anteriorly, laterally, across midline and pick up object from floor with prosthesis with supervision.  (Target Date: 04/18/2016)   Time 4   Period Weeks   Status On-going           PT Long Term  Goals - 04/02/16 2213    PT LONG TERM GOAL #1   Title Patient verbalizes & demonstrates understanding of prosthetic care to enable safe use of prosthesis. (Target Date: 05/16/2016)   Time 8   Period Weeks   Status On-going   PT LONG TERM GOAL #2   Title Patient tolerates wear of prosthesis >90% of awake hours without skin issues or limb tenderness to enable function throughout his awake hours.  (Target Date: 05/16/2016)   Time 8   Period Weeks   Status On-going   PT LONG TERM GOAL #3   Title Berg Balance Test > 45/56 to indicate lower fall risk.  (Target Date: 05/16/2016)   Time 8   Period Weeks   Status On-going   PT LONG TERM GOAL #4   Title Patient ambulates 500' with LRAD & prosthesis outside including grass, ramps & curbs modified independent for community access.  (Target Date: 05/16/2016)   Time 8   Period Weeks   Status On-going   PT LONG TERM GOAL #5   Title Patient ambulates around furniture carrrying plate & cup with only prosthesis independently.  (Target Date: 05/16/2016)   Time 8   Period Weeks   Status On-going   PT LONG TERM GOAL #6   Title Patient able to transfer on/off ground and demonstrate yard work and golf swing  with prosthesis modified independent to return to leisure activities.  (Target Date: 05/16/2016)   Time 8   Period Weeks   Status On-going               Plan - 04/07/16 1400    Clinical Impression Statement Patient is progressing to short term goals, able to ambulate with bilateral axiallary crutches with minimal cuing for sequencing, progressed to use SPC with Mod A, patient required increased cuing for sequencing with SPC.   Rehab Potential Good   PT Frequency 2x / week   PT Duration 8 weeks   PT Treatment/Interventions ADLs/Self Care Home Management;DME Instruction;Gait training;Stair training;Functional mobility training;Therapeutic activities;Therapeutic exercise;Balance training;Neuromuscular re-education;Patient/family education;Prosthetic Training   PT Next Visit Plan gait on unlevel surfaces, continue with SPC for gait   PT Home Exercise Plan --   Consulted and Agree with Plan of Care Patient      Patient will benefit from skilled therapeutic intervention in order to improve the following deficits and impairments:  Abnormal gait, Decreased activity tolerance, Decreased balance, Decreased endurance, Decreased mobility, Postural dysfunction, Prosthetic Dependency  Visit Diagnosis: Other abnormalities of gait and mobility  Above knee amputation of right lower extremity (HCC)  Unsteadiness on feet     Problem List Patient Active Problem List   Diagnosis Date Noted  . Type 2 diabetes with complication ([place]) [id number]  . Phantom pain after amputation of lower extremity ([place]) 01/18/2016  . Neuropathic pain   . Above knee amputation of right lower extremity ([place]) 12/29/2015  . PAD (peripheral artery disease) ([place]) 12/15/2015  . Hyperlipidemia associated with type 2 diabetes mellitus ([place]) 12/07/2015  . Hypertension associated with diabetes ([place]) 12/04/2014    Dillard Essex, SPT 04/07/2016, 4:51 PM  Jamey Reas, PT, DPT PT Specializing in Prosthetics &  Orthotics 04/08/2016 7:33 AM Phone:  [phone]  Fax:  [phone] [place] [address] [place], [place] [address]  [facility] [address] [place], [place], [address] Phone: [phone]   Fax:  [phone]  Name: Adam [name] MRN: [id number] Date of Birth: 11/09/1951

## 2016-04-09 ENCOUNTER — Ambulatory Visit: Payer: Medicare Other | Admitting: Physical Therapy

## 2016-04-09 DIAGNOSIS — R2681 Unsteadiness on feet: Secondary | ICD-10-CM

## 2016-04-09 DIAGNOSIS — R2689 Other abnormalities of gait and mobility: Secondary | ICD-10-CM | POA: Diagnosis not present

## 2016-04-09 DIAGNOSIS — R29898 Other symptoms and signs involving the musculoskeletal system: Secondary | ICD-10-CM | POA: Diagnosis not present

## 2016-04-09 DIAGNOSIS — Z89611 Acquired absence of right leg above knee: Secondary | ICD-10-CM | POA: Diagnosis not present

## 2016-04-09 NOTE — Therapy (Signed)
[facility] [address] [place], [place], [address] Phone: [phone]   Fax:  [phone]  Physical Therapy Treatment  Patient Details  Name: Adam [name] MRN: [id number] Date of Birth: [birth year] Referring Provider: [name], MD  Encounter Date: 04/09/2016      PT End of Session - 04/09/16 1345    Visit Number 7   Number of Visits 17   Date for PT Re-Evaluation 05/19/16   Authorization Type Medicare G-code & progress note   PT Start Time 1016   PT Stop Time 1100   PT Time Calculation (min) 44 min   Equipment Utilized During Treatment Gait belt   Activity Tolerance Patient tolerated treatment well;No increased pain   Behavior During Therapy [facility] for tasks assessed/performed      Past Medical History  Diagnosis Date  . Hypertension   . Peripheral vascular disease ([place])   . Gangrene ([place]) 11/22/2015    right great toe  . OSA on CPAP   . Type II diabetes mellitus ([place]) dx'd 11/2015  . PAD (peripheral artery disease) [facility])     Past Surgical History  Procedure Laterality Date  . Peripheral vascular catheterization N/A 11/26/2015    Procedure: Abdominal Aortogram;  Surgeon: [name], MD;  Location: [place] [facility];  Service: Cardiovascular;  Laterality: N/A;  . Vasectomy    . Colonoscopy    . Femoral-popliteal bypass graft Right 11/28/2015    Procedure: RIGHT  FEMORAL-POPLITEAL ARTERY BYPASS GRAFT AND CONSTRUCTION OF MILLER CUFF USING 6 MM X 80 CM PROPATEN  GRAFT ;  Surgeon: Conrad [place], MD;  Location: [place];  Service: Vascular;  Laterality: Right;  . Amputation Right 12/01/2015    Procedure: AMPUTATION RIGHT GREAT;  Surgeon: Conrad Samson, MD;  Location: [place];  Service: Vascular;  Laterality: Right;  . Amputation Right 12/16/2015    Procedure: AMPUTATION BELOW KNEE;  Surgeon: [name], MD;  Location: [place];  Service: Vascular;  Laterality: Right;  . Amputation Right 12/26/2015    Procedure:  RIGHT ABOVE KNEE AMPUTATION;  Surgeon: [name], MD;  Location: [place];  Service: Vascular;  Laterality: Right;  . Inguinal hernia repair Bilateral 2000    There were no vitals filed for this visit.      Subjective Assessment - 04/09/16 1019    Subjective No falls, no skin concerns, wearing all day takes it off for around an 1 hr midday   Pertinent History DM, HTN   Limitations Lifting;Standing;Walking   Patient Stated Goals Use prosthesis to play golf, go to [employer] including boating, yardwork                         [facility] Adult PT Treatment/Exercise - 04/09/16 1324    Transfers   Transfers Sit to Stand;Stand to Sit   Sit to Stand 5: Supervision;With upper extremity assist;From chair/3-in-1  use of axillary crutches   Sit to Stand Details Verbal cues for sequencing;Verbal cues for precautions/safety   Sit to Stand Details (indicate cue type and reason) verbal cuing for reaching back with arms and for controling prosthetic knee   Stand to Sit 5: Supervision;With upper extremity assist;To chair/3-in-1  use of axillary crutches or SPC   Stand to Sit Details (indicate cue type and reason) Verbal cues for sequencing;Verbal cues for safe use of DME/AE   Ambulation/Gait   Ambulation/Gait Yes   Ambulation/Gait Assistance 5: Supervision;4: Min  assist  supervision with axillary crutches, Min A for [facility]   Ambulation/Gait Assistance Details Pt shows improvements with SPC but is less confident and seeks more cuing for controlling prosthesis, he still demostrates decreased weight shift on to the prosthesis during ambulation   Ambulation Distance (Feet) 210 Feet  x1, 120    Assistive device Prosthesis;Crutches;Straight cane   Gait Pattern Decreased step length - left;Decreased stride length;Decreased hip/knee flexion - right;Decreased weight shift to right;Poor foot clearance - right;Decreased stance time - right;Wide base of support   Ambulation Surface Level;Indoor    Ramp 5: Supervision  axillary crutches & prosthesis   Curb 5: Supervision  axillary crutches & prosthesis   High Level Balance   High Level Balance Activities Backward walking;Side stepping;Tandem walking;Figure 8 turns;Negotiating over obstacles   High Level Balance Comments Pt performed figure 8 around chairs with [facility], verbal cuing and demo on sequencing LEs and hip movements. Tandem, backward walking, side stepping, step ups and negogiating objects in // bars with emphasis on shifting weight onto prosthesis and decreasing UE dependency    Prosthetics   Current prosthetic wear tolerance (days/week)  daily   Current prosthetic wear tolerance (#hours/day)  wearing prosthesis all day except 1 hr midday    Residual limb condition  Pt reports no issues   Education Provided --  tightening suspension strap                PT Education - 04/09/16 1342    Education provided Yes   Education Details Shifitng more weight onto prosthesis   Methods Demonstration;Explanation;Tactile cues;Verbal cues   Comprehension Verbalized understanding;Returned demonstration;Verbal cues required;Tactile cues required          PT Short Term Goals - 04/02/16 2213    PT SHORT TERM GOAL #1   Title Patient tolerates wear of prosthesis >10hrs total /day without skin issues or limb pain. (Target Date: 04/18/2016)   Time 4   Period Weeks   Status On-going   PT SHORT TERM GOAL #2   Title Patient demonstrates proper donning & verbalizes proper cleaning of prosthesis.  (Target Date: 04/18/2016)   Time 4   Period Weeks   Status On-going   PT SHORT TERM GOAL #3   Title Patient ambulates 200' with crutches & prosthesis safely with cues only for deviations not physical assist.  (Target Date: 04/18/2016)   Time 4   Period Weeks   Status On-going   PT SHORT TERM GOAL #4   Title Patient negotiates ramps, curbs and stairs (1 rail) with crutches with supervision.  (Target Date: 04/18/2016)   Time 4   Period Weeks    Status On-going   PT SHORT TERM GOAL #5   Title Patient able to reach 7" anteriorly, laterally, across midline and pick up object from floor with prosthesis with supervision.  (Target Date: 04/18/2016)   Time 4   Period Weeks   Status On-going           PT Long Term Goals - 04/02/16 2213    PT LONG TERM GOAL #1   Title Patient verbalizes & demonstrates understanding of prosthetic care to enable safe use of prosthesis. (Target Date: 05/16/2016)   Time 8   Period Weeks   Status On-going   PT LONG TERM GOAL #2   Title Patient tolerates wear of prosthesis >90% of awake hours without skin issues or limb tenderness to enable function throughout his awake hours.  (Target Date: 05/16/2016)   Time 8  Period Weeks   Status On-going   PT LONG TERM GOAL #3   Title Berg Balance Test > 45/56 to indicate lower fall risk.  (Target Date: 05/16/2016)   Time 8   Period Weeks   Status On-going   PT LONG TERM GOAL #4   Title Patient ambulates 500' with LRAD & prosthesis outside including grass, ramps & curbs modified independent for community access.  (Target Date: 05/16/2016)   Time 8   Period Weeks   Status On-going   PT LONG TERM GOAL #5   Title Patient ambulates around furniture carrrying plate & cup with only prosthesis independently.  (Target Date: 05/16/2016)   Time 8   Period Weeks   Status On-going   PT LONG TERM GOAL #6   Title Patient able to transfer on/off ground and demonstrate yard work and golf swing with prosthesis modified independent to return to leisure activities.  (Target Date: 05/16/2016)   Time 8   Period Weeks   Status On-going               Plan - 04/09/16 1346    Clinical Impression Statement Patient able to ambulate and manage ramps, curbs with bilaterally axillary crutches with supervision, continues to demostrate decreased weight through prosthesis with funcitonal movements. Use of SPC improved with Min A for loss of balance, patient continues to be progressing  towards short term goals   Rehab Potential Good   PT Frequency 2x / week   PT Duration 8 weeks   PT Treatment/Interventions ADLs/Self Care Home Management;DME Instruction;Gait training;Stair training;Functional mobility training;Therapeutic activities;Therapeutic exercise;Balance training;Neuromuscular re-education;Patient/family education;Prosthetic Training   PT Next Visit Plan gait on unlevel surfaces, continue with SPC for gait   Consulted and Agree with Plan of Care Patient      Patient will benefit from skilled therapeutic intervention in order to improve the following deficits and impairments:  Abnormal gait, Decreased activity tolerance, Decreased balance, Decreased endurance, Decreased mobility, Postural dysfunction, Prosthetic Dependency  Visit Diagnosis: Other abnormalities of gait and mobility  Unsteadiness on feet  Other symptoms and signs involving the musculoskeletal system     Problem List Patient Active Problem List   Diagnosis Date Noted  . Type 2 diabetes with complication ([place]) [id number]  . Phantom pain after amputation of lower extremity ([place]) 01/18/2016  . Neuropathic pain   . Above knee amputation of right lower extremity ([place]) 12/29/2015  . PAD (peripheral artery disease) ([place]) 12/15/2015  . Hyperlipidemia associated with type 2 diabetes mellitus ([place]) 12/07/2015  . Hypertension associated with diabetes ([place]) 12/04/2014    Dillard Essex, SPT 04/09/2016, 2:22 PM  [facility] [address] [place], [place], [address] Phone: [phone]   Fax:  [phone]  Name: Adam [name] MRN: [id number] Date of Birth: September 22, 1951  Bjorn Loser, PTA  04/10/2016, 8:37 AM

## 2016-04-16 ENCOUNTER — Ambulatory Visit: Payer: Medicare Other | Admitting: Physical Therapy

## 2016-04-16 DIAGNOSIS — R2689 Other abnormalities of gait and mobility: Secondary | ICD-10-CM

## 2016-04-16 DIAGNOSIS — R29898 Other symptoms and signs involving the musculoskeletal system: Secondary | ICD-10-CM

## 2016-04-16 DIAGNOSIS — R2681 Unsteadiness on feet: Secondary | ICD-10-CM | POA: Diagnosis not present

## 2016-04-16 DIAGNOSIS — Z89611 Acquired absence of right leg above knee: Secondary | ICD-10-CM | POA: Diagnosis not present

## 2016-04-16 NOTE — Therapy (Signed)
[facility] [address] [place], [place], [address] Phone: [phone]   Fax:  [phone]  Physical Therapy Treatment  Patient Details  Name: Adam [name] MRN: [id number] Date of Birth: 05/30/1951 Referring Provider: [name], MD  Encounter Date: 04/16/2016      PT End of Session - 04/16/16 1243    Visit Number 8   Number of Visits 17   Date for PT Re-Evaluation 05/19/16   Authorization Type Medicare G-code & progress note   PT Start Time 1017   PT Stop Time 1101   PT Time Calculation (min) 44 min   Equipment Utilized During Treatment Gait belt   Activity Tolerance Patient tolerated treatment well;No increased pain   Behavior During Therapy [facility] for tasks assessed/performed      Past Medical History  Diagnosis Date  . Hypertension   . Peripheral vascular disease ([place])   . Gangrene ([place]) 11/22/2015    right great toe  . OSA on CPAP   . Type II diabetes mellitus ([place]) dx'd 11/2015  . PAD (peripheral artery disease) [facility])     Past Surgical History  Procedure Laterality Date  . Peripheral vascular catheterization N/A 11/26/2015    Procedure: Abdominal Aortogram;  Surgeon: [name], MD;  Location: [place] [facility];  Service: Cardiovascular;  Laterality: N/A;  . Vasectomy    . Colonoscopy    . Femoral-popliteal bypass graft Right 11/28/2015    Procedure: RIGHT  FEMORAL-POPLITEAL ARTERY BYPASS GRAFT AND CONSTRUCTION OF MILLER CUFF USING 6 MM X 80 CM PROPATEN  GRAFT ;  Surgeon: Conrad [place], MD;  Location: [place];  Service: Vascular;  Laterality: Right;  . Amputation Right 12/01/2015    Procedure: AMPUTATION RIGHT GREAT;  Surgeon: Conrad , MD;  Location: [place];  Service: Vascular;  Laterality: Right;  . Amputation Right 12/16/2015    Procedure: AMPUTATION BELOW KNEE;  Surgeon: [name], MD;  Location: [place];  Service: Vascular;  Laterality: Right;  . Amputation Right 12/26/2015    Procedure:  RIGHT ABOVE KNEE AMPUTATION;  Surgeon: [name], MD;  Location: [place];  Service: Vascular;  Laterality: Right;  . Inguinal hernia repair Bilateral 2000    There were no vitals filed for this visit.      Subjective Assessment - 04/16/16 1019    Subjective Pt went to the beach over the weekend, reports no issues with falls or skin. Stayed on the boat at all times and did not go on the sand. Needed assistance getting the boat in and out of water. Wishes to get in the water next time he goes to the beach.   Pertinent History DM, HTN   Limitations Lifting;Standing;Walking   Patient Stated Goals Use prosthesis to play golf, go to beach house including boating, yardwork   Currently in Pain? No/denies             Prosthetics Assessment - 04/16/16 1021    Prosthetics   Current prosthetic wear tolerance (days/week)  daily   Current prosthetic wear tolerance (#hours/day)  wearing prosthesis all day except 1 hr midday He reports he did not wear prosthesis days he was on the boat.   Residual limb condition  Pt reports no issues                    [facility] Adult PT Treatment/Exercise - 04/16/16 1021    Transfers   Transfers Sit to Stand;Stand to Sit  Sit to Stand 5: Supervision;With upper extremity assist;From chair/3-in-1  use of axillary crutches   Sit to Stand Details Verbal cues for sequencing;Verbal cues for precautions/safety   Stand to Sit 5: Supervision;With upper extremity assist;To chair/3-in-1  use of axillary crutches or SPC   Stand to Sit Details (indicate cue type and reason) Verbal cues for sequencing;Verbal cues for safe use of DME/AE   Stand to Sit Details verbal for squaring up against chair before sitting down   Ambulation/Gait   Ambulation/Gait Yes   Ambulation/Gait Assistance 5: Supervision;4: Min assist  supervision with axillary crutches, Min A for [facility]   Ambulation/Gait Assistance Details 6 min on treadmill, avg speed 1.3 mph, pt demostrated decreased  step length and time on RLe during treadmill walking, gait on overground after treadmilll walking showed longer step length on the R. Pt continues to decrease weight on prosthesis during ambulation    Ambulation Distance (Feet) 200 Feet  200' with B axillary crutches, 115' x1 with [facility]   Assistive device Prosthesis;Crutches;Straight cane;R Axillary Crutch;L Axillary Crutch   Gait Pattern Decreased step length - left;Decreased stride length;Decreased hip/knee flexion - right;Decreased weight shift to right;Poor foot clearance - right;Decreased stance time - right;Wide base of support   Ambulation Surface Level;Indoor   Stairs Yes   Stairs Assistance 6: Modified independent (Device/Increase time)   Stair Management Technique With crutches   Number of Stairs 4   Height of Stairs 6   Ramp 5: Supervision  axillary crutches & prosthesis   Ramp Details (indicate cue type and reason) cues for unlocking knee when ascending ramp, and for allowing knee to either lock or unlock during decent.   Curb 5: Supervision  axillary crutches & prosthesis   High Level Balance   High Level Balance Activities Figure 8 turns;Negotiating over obstacles   High Level Balance Comments Pt performed figure 8 around hoola hoops with SPC, verbal cuing and demo on sequencing LEs and hip movements.    Neuro Re-ed    Neuro Re-ed Details  Reaching anterior, lateral and to the floor with no UE support, cues needed for reaching to floor safely with prosthesis locked   Prosthetics   Education Provided --                PT Education - 04/16/16 1024    Education provided Yes   Education Details Fall risk when not wearing prosthesis    Person(s) Educated Patient   Methods Explanation   Comprehension Verbalized understanding          PT Short Term Goals - 04/16/16 1245    PT SHORT TERM GOAL #1   Title Patient tolerates wear of prosthesis >10hrs total /day without skin issues or limb pain. (Target Date: 04/18/2016)    Baseline Met: 04/16/16   Time 4   Period Weeks   Status Achieved   PT SHORT TERM GOAL #2   Title Patient demonstrates proper donning & verbalizes proper cleaning of prosthesis.  (Target Date: 04/18/2016)   Baseline Met: 04/16/16   Time 4   Period Weeks   Status Achieved   PT SHORT TERM GOAL #3   Title Patient ambulates 200' with crutches & prosthesis safely with cues only for deviations not physical assist.  (Target Date: 04/18/2016)   Baseline Met: 04/16/16   Time 4   Period Weeks   Status Achieved   PT SHORT TERM GOAL #4   Title Patient negotiates ramps, curbs and stairs (1 rail) with crutches with supervision.  (  Target Date: 04/18/2016)   Baseline Met: 04/16/16   Time 4   Period Weeks   Status Achieved   PT SHORT TERM GOAL #5   Title Patient able to reach 7" anteriorly, laterally, across midline and pick up object from floor with prosthesis with supervision.  (Target Date: 04/18/2016)   Baseline Pt able to reach anteriorally, laterally and across midline 7" with supervision, picking object up from floor Min A    Time 4   Period Weeks   Status Partially Met           PT Long Term Goals - 04/02/16 2213    PT LONG TERM GOAL #1   Title Patient verbalizes & demonstrates understanding of prosthetic care to enable safe use of prosthesis. (Target Date: 05/16/2016)   Time 8   Period Weeks   Status On-going   PT LONG TERM GOAL #2   Title Patient tolerates wear of prosthesis >90% of awake hours without skin issues or limb tenderness to enable function throughout his awake hours.  (Target Date: 05/16/2016)   Time 8   Period Weeks   Status On-going   PT LONG TERM GOAL #3   Title Berg Balance Test > 45/56 to indicate lower fall risk.  (Target Date: 05/16/2016)   Time 8   Period Weeks   Status On-going   PT LONG TERM GOAL #4   Title Patient ambulates 500' with LRAD & prosthesis outside including grass, ramps & curbs modified independent for community access.  (Target Date: 05/16/2016)    Time 8   Period Weeks   Status On-going   PT LONG TERM GOAL #5   Title Patient ambulates around furniture carrrying plate & cup with only prosthesis independently.  (Target Date: 05/16/2016)   Time 8   Period Weeks   Status On-going   PT LONG TERM GOAL #6   Title Patient able to transfer on/off ground and demonstrate yard work and golf swing with prosthesis modified independent to return to leisure activities.  (Target Date: 05/16/2016)   Time 8   Period Weeks   Status On-going               Plan - 04/16/16 1244    Clinical Impression Statement Patient has met all STGs, expect Min A with picking up objects from floor. Patient is progressing with use of SPC he is still less confident and requires increased assistance with this device. He is progressing to long term goals.    Rehab Potential Good   PT Frequency 2x / week   PT Duration 8 weeks   PT Treatment/Interventions ADLs/Self Care Home Management;DME Instruction;Gait training;Stair training;Functional mobility training;Therapeutic activities;Therapeutic exercise;Balance training;Neuromuscular re-education;Patient/family education;Prosthetic Training   PT Next Visit Plan gait on unlevel surfaces, continue with SPC for gait   Consulted and Agree with Plan of Care Patient      Patient will benefit from skilled therapeutic intervention in order to improve the following deficits and impairments:  Abnormal gait, Decreased activity tolerance, Decreased balance, Decreased endurance, Decreased mobility, Postural dysfunction, Prosthetic Dependency  Visit Diagnosis: Other abnormalities of gait and mobility  Unsteadiness on feet  Other symptoms and signs involving the musculoskeletal system     Problem List Patient Active Problem List   Diagnosis Date Noted  . Type 2 diabetes with complication ([place]) 27/01/5008  . Phantom pain after amputation of lower extremity ([place]) 01/18/2016  . Neuropathic pain   . Above knee amputation  of right lower extremity ([place]) 12/29/2015  .  PAD (peripheral artery disease) ([place]) 12/15/2015  . Hyperlipidemia associated with type 2 diabetes mellitus ([place]) 12/07/2015  . Hypertension associated with diabetes ([place]) 12/04/2014   Dillard Essex, SPT 04/16/2016, 12:52 PM  Jamey Reas, PT, DPT PT Specializing in [place] 04/16/2016 4:50 PM Phone:  [phone]  Fax:  [phone] [facility] [address] [place], [place] [address]   [facility] [address] [place], [place], [address] Phone: [phone]   Fax:  [phone]  Name: Adam [name] MRN: [id number] Date of Birth: [birth year]

## 2016-04-18 ENCOUNTER — Ambulatory Visit: Payer: Medicare Other | Attending: Vascular Surgery | Admitting: Physical Therapy

## 2016-04-18 ENCOUNTER — Encounter: Payer: Self-pay | Admitting: Physical Therapy

## 2016-04-18 ENCOUNTER — Encounter: Payer: Self-pay | Admitting: Family Medicine

## 2016-04-18 ENCOUNTER — Ambulatory Visit (INDEPENDENT_AMBULATORY_CARE_PROVIDER_SITE_OTHER): Payer: Medicare Other | Admitting: Family Medicine

## 2016-04-18 VITALS — BP 170/90 | HR 64 | Ht 71.0 in | Wt 169.0 lb

## 2016-04-18 DIAGNOSIS — E1169 Type 2 diabetes mellitus with other specified complication: Secondary | ICD-10-CM | POA: Diagnosis not present

## 2016-04-18 DIAGNOSIS — R2689 Other abnormalities of gait and mobility: Secondary | ICD-10-CM | POA: Diagnosis not present

## 2016-04-18 DIAGNOSIS — M792 Neuralgia and neuritis, unspecified: Secondary | ICD-10-CM | POA: Diagnosis not present

## 2016-04-18 DIAGNOSIS — R2681 Unsteadiness on feet: Secondary | ICD-10-CM | POA: Insufficient documentation

## 2016-04-18 DIAGNOSIS — E1159 Type 2 diabetes mellitus with other circulatory complications: Secondary | ICD-10-CM

## 2016-04-18 DIAGNOSIS — Z89511 Acquired absence of right leg below knee: Secondary | ICD-10-CM | POA: Insufficient documentation

## 2016-04-18 DIAGNOSIS — I152 Hypertension secondary to endocrine disorders: Secondary | ICD-10-CM

## 2016-04-18 DIAGNOSIS — S78111A Complete traumatic amputation at level between right hip and knee, initial encounter: Secondary | ICD-10-CM

## 2016-04-18 DIAGNOSIS — G546 Phantom limb syndrome with pain: Secondary | ICD-10-CM | POA: Diagnosis not present

## 2016-04-18 DIAGNOSIS — E785 Hyperlipidemia, unspecified: Secondary | ICD-10-CM | POA: Diagnosis not present

## 2016-04-18 DIAGNOSIS — Z89611 Acquired absence of right leg above knee: Secondary | ICD-10-CM | POA: Insufficient documentation

## 2016-04-18 DIAGNOSIS — R29898 Other symptoms and signs involving the musculoskeletal system: Secondary | ICD-10-CM | POA: Diagnosis not present

## 2016-04-18 DIAGNOSIS — E118 Type 2 diabetes mellitus with unspecified complications: Secondary | ICD-10-CM | POA: Diagnosis not present

## 2016-04-18 DIAGNOSIS — I10 Essential (primary) hypertension: Secondary | ICD-10-CM

## 2016-04-18 DIAGNOSIS — I70261 Atherosclerosis of native arteries of extremities with gangrene, right leg: Secondary | ICD-10-CM | POA: Diagnosis not present

## 2016-04-18 NOTE — Progress Notes (Signed)
  Subjective:    Patient ID: Adam [name], male    DOB: 1951/02/28, 65 y.o.   MRN: [id number]  Adam [name] is a 65 y.o. male who presents for follow-up of Type 2 diabetes mellitus.  Patient is not checking home blood sugars.   Home blood sugar records: --- How often is blood sugars being checked:Very intermittent. Current symptoms/problems none. Daily foot checks:  yes  Any foot concerns: none. Right AKA Last eye exam: 1 year and 1/2 Exercise: doing pt twice a week . He is still involved in physical therapy and rehabilitation with his amputation. He now has a prosthesis and is getting more more comfortable with this but still using crutches. He has been checking his blood pressure at home however he did not bring it in. He continues on Neurontin and other medications listed in the chart. He still is having some phantom pain. He has no other concerns or complaints.  The following portions of the patient's history were reviewed and updated as appropriate: allergies, current medications, past medical history, past social history and problem list.  ROS as in subjective above.     Objective:    Physical Exam Alert and in no distress otherwise not examined.  Lab Review Diabetic Labs Latest Ref Rng 12/31/2015 12/28/2015 12/27/2015 12/26/2015 12/24/2015  HbA1c 4.8 - 5.6 % - - - - -  Chol 125 - 200 mg/dL - - - - -  HDL >=40 mg/dL - - - - -  Calc LDL <130 mg/dL - - - - -  Triglycerides <150 mg/dL - - - - -  Creatinine 0.61 - 1.24 mg/dL 0.64 0.69 0.65 0.70 0.82   BP/Weight 03/06/2016 02/20/2016 02/11/2016 01/18/2016 [id number]  Systolic BP 0000000 - [id number] 0000000 [id number]  Diastolic BP 93 - 123XX123 80 87  Wt. (Lbs) 169 162 - 163 -  BMI 23.58 22.6 - 22.74 -  A1c is 5.3 Adam [name]  reports that he quit smoking about 4 years ago. His smoking use included Cigarettes. He has a 30 pack-year smoking history. He has never used smokeless tobacco. He reports that he drinks about 4.2 oz of alcohol per week. He reports that he does  not use illicit drugs.     Assessment & Plan:    Hypertension associated with diabetes ([place])  Hyperlipidemia associated with type 2 diabetes mellitus ([place])  Above knee amputation of right lower extremity ([facility])  Neuropathic pain  Type 2 diabetes mellitus with complication, without long-term current use of insulin ([place])  Phantom pain after amputation of lower extremity ([place])   1. Rx changes: none 2. Education: Reviewed 'ABCs' of diabetes management (respective goals in parentheses):  A1C (<7), blood pressure (<130/80), and cholesterol (LDL <100). 3. Compliance at present is estimated to be good. Efforts to improve compliance (if necessary) will be directed at increased exercise as tolerated and getting used to his new prosthesis he is also to bring his blood pressure cuff in and compare to ours to get an accurate reading as today's blood pressure was quite elevated. 4. Follow up: 4 months

## 2016-04-19 DIAGNOSIS — S61011A Laceration without foreign body of right thumb without damage to nail, initial encounter: Secondary | ICD-10-CM | POA: Diagnosis not present

## 2016-04-19 NOTE — Therapy (Signed)
[facility] [address] [place], [place], [address] Phone: [phone]   Fax:  [phone]  Physical Therapy Treatment  Patient Details  Name: Adam [name] MRN: [id number] Date of Birth: 04-25-1951 Referring Provider: [name], MD  Encounter Date: 04/18/2016      PT End of Session - 04/18/16 1451    Visit Number 9   Number of Visits 17   Date for PT Re-Evaluation 05/19/16   Authorization Type Medicare G-code & progress note   PT Start Time 1446   PT Stop Time 1528   PT Time Calculation (min) 42 min   Equipment Utilized During Treatment Gait belt   Activity Tolerance Patient tolerated treatment well;No increased pain   Behavior During Therapy [facility] for tasks assessed/performed      Past Medical History  Diagnosis Date  . Hypertension   . Peripheral vascular disease ([place])   . Gangrene ([place]) 11/22/2015    right great toe  . OSA on CPAP   . Type II diabetes mellitus ([place]) dx'd 11/2015  . PAD (peripheral artery disease) [facility])     Past Surgical History  Procedure Laterality Date  . Peripheral vascular catheterization N/A 11/26/2015    Procedure: Abdominal Aortogram;  Surgeon: [name], MD;  Location: [place] [facility];  Service: Cardiovascular;  Laterality: N/A;  . Vasectomy    . Colonoscopy    . Femoral-popliteal bypass graft Right 11/28/2015    Procedure: RIGHT  FEMORAL-POPLITEAL ARTERY BYPASS GRAFT AND CONSTRUCTION OF MILLER CUFF USING 6 MM X 80 CM PROPATEN  GRAFT ;  Surgeon: Conrad [place], MD;  Location: [place];  Service: Vascular;  Laterality: Right;  . Amputation Right 12/01/2015    Procedure: AMPUTATION RIGHT GREAT;  Surgeon: Conrad [place], MD;  Location: [place];  Service: Vascular;  Laterality: Right;  . Amputation Right 12/16/2015    Procedure: AMPUTATION BELOW KNEE;  Surgeon: [name], MD;  Location: [place];  Service: Vascular;  Laterality: Right;  . Amputation Right 12/26/2015    Procedure:  RIGHT ABOVE KNEE AMPUTATION;  Surgeon: [name], MD;  Location: [place];  Service: Vascular;  Laterality: Right;  . Inguinal hernia repair Bilateral 2000    There were no vitals filed for this visit.      Subjective Assessment - 04/18/16 1449    Subjective No new complaints. No falls or pain to report.    Pertinent History DM, HTN   Limitations Lifting;Standing;Walking   Patient Stated Goals Use prosthesis to play golf, go to beach house including boating, yardwork   Currently in Pain? No/denies            [facility] Adult PT Treatment/Exercise - 04/18/16 1451    Transfers   Transfers Sit to Stand;Stand to Sit   Sit to Stand 5: Supervision;With upper extremity assist;From chair/3-in-1   Stand to Sit 5: Supervision;With upper extremity assist;To chair/3-in-1   Ambulation/Gait   Ambulation/Gait Yes   Ambulation/Gait Assistance 4: Min guard;5: Supervision   Ambulation/Gait Assistance Details occasional cues on knee stability. cues on use of prosthetic knee with gait and on step length. Pt occasionally circumducts prosthetic leg to advance it vs knee flexion.   Ambulation Distance (Feet) 500 Feet   Assistive device Crutches;Prosthesis   Gait Pattern Decreased step length - left;Decreased stride length;Decreased hip/knee flexion - right;Decreased weight shift to right;Poor foot clearance - right;Decreased stance time - right;Narrow base of support   Ambulation Surface Level;Unlevel;Indoor;Outdoor;Paved;Gravel;Grass  Ramp Other (comment)  min guard assist with crutches/prosthesis   Ramp Details (indicate cue type and reason) min guard for safety   Prosthetics   Current prosthetic wear tolerance (days/week)  daily   Current prosthetic wear tolerance (#hours/day)  all awake hours except for 1 hour midday   Residual limb condition  did have some heat rash, cleared up now. intact per pt.   Donning Prosthesis Supervision   Doffing Prosthesis Supervision             Balance  Exercises - 04/18/16 1527    Balance Exercises: Standing   Rockerboard Anterior/posterior;Lateral;Head turns;EO;EC;Other time (comment);10 reps;Other (comment)   Other Standing Exercises on airex using green theraband: rows, shoulder extension, alternating shoulder flexion, shoulder horizontal abduction and forward puches x 10 each with cues on core stability and stand stability on air ex. min guard assist for balance.   Balance Exercises: Standing   Rebounder Limitations both ways on balance board with no UE support, min guard to min assist: EO alternating UE raises x 10 each, bil simultaneous UE raises x 10 reps;EC no head movements 15 sec's x 3 reps, EC head movements up<>down and left<>right x 10 each             PT Short Term Goals - 04/16/16 1245    PT SHORT TERM GOAL #1   Title Patient tolerates wear of prosthesis >10hrs total /day without skin issues or limb pain. (Target Date: 04/18/2016)   Baseline Met: 04/16/16   Time 4   Period Weeks   Status Achieved   PT SHORT TERM GOAL #2   Title Patient demonstrates proper donning & verbalizes proper cleaning of prosthesis.  (Target Date: 04/18/2016)   Baseline Met: 04/16/16   Time 4   Period Weeks   Status Achieved   PT SHORT TERM GOAL #3   Title Patient ambulates 200' with crutches & prosthesis safely with cues only for deviations not physical assist.  (Target Date: 04/18/2016)   Baseline Met: 04/16/16   Time 4   Period Weeks   Status Achieved   PT SHORT TERM GOAL #4   Title Patient negotiates ramps, curbs and stairs (1 rail) with crutches with supervision.  (Target Date: 04/18/2016)   Baseline Met: 04/16/16   Time 4   Period Weeks   Status Achieved   PT SHORT TERM GOAL #5   Title Patient able to reach 7" anteriorly, laterally, across midline and pick up object from floor with prosthesis with supervision.  (Target Date: 04/18/2016)   Baseline Pt able to reach anteriorally, laterally and across midline 7" with supervision, picking  object up from floor Min A    Time 4   Period Weeks   Status Partially Met           PT Long Term Goals - 04/02/16 2213    PT LONG TERM GOAL #1   Title Patient verbalizes & demonstrates understanding of prosthetic care to enable safe use of prosthesis. (Target Date: 05/16/2016)   Time 8   Period Weeks   Status On-going   PT LONG TERM GOAL #2   Title Patient tolerates wear of prosthesis >90% of awake hours without skin issues or limb tenderness to enable function throughout his awake hours.  (Target Date: 05/16/2016)   Time 8   Period Weeks   Status On-going   PT LONG TERM GOAL #3   Title Berg Balance Test > 45/56 to indicate lower fall risk.  (Target Date: 05/16/2016)  Time 8   Period Weeks   Status On-going   PT LONG TERM GOAL #4   Title Patient ambulates 500' with LRAD & prosthesis outside including grass, ramps & curbs modified independent for community access.  (Target Date: 05/16/2016)   Time 8   Period Weeks   Status On-going   PT LONG TERM GOAL #5   Title Patient ambulates around furniture carrrying plate & cup with only prosthesis independently.  (Target Date: 05/16/2016)   Time 8   Period Weeks   Status On-going   PT LONG TERM GOAL #6   Title Patient able to transfer on/off ground and demonstrate yard work and golf swing with prosthesis modified independent to return to leisure activities.  (Target Date: 05/16/2016)   Time 8   Period Weeks   Status On-going               Plan - 04/18/16 1451    Clinical Impression Statement Today's session addressed gait on compliant surfaces with crutches. Pt with occasional knee instability, able to self correct each time.  Remainder of session addressed balance activities. Pt making steady progress toward goals.   Rehab Potential Good   PT Frequency 2x / week   PT Duration 8 weeks   PT Treatment/Interventions ADLs/Self Care Home Management;DME Instruction;Gait training;Stair training;Functional mobility  training;Therapeutic activities;Therapeutic exercise;Balance training;Neuromuscular re-education;Patient/family education;Prosthetic Training   PT Next Visit Plan G code next visit. continue with [facility] for gait and balance activities.   Consulted and Agree with Plan of Care Patient      Patient will benefit from skilled therapeutic intervention in order to improve the following deficits and impairments:  Abnormal gait, Decreased activity tolerance, Decreased balance, Decreased endurance, Decreased mobility, Postural dysfunction, Prosthetic Dependency  Visit Diagnosis: Other abnormalities of gait and mobility  Unsteadiness on feet  Other symptoms and signs involving the musculoskeletal system     Problem List Patient Active Problem List   Diagnosis Date Noted  . Type 2 diabetes with complication ([place]) 72/25/7505  . Phantom pain after amputation of lower extremity ([place]) 01/18/2016  . Neuropathic pain   . Above knee amputation of right lower extremity ([place]) 12/29/2015  . PAD (peripheral artery disease) ([place]) 12/15/2015  . Hyperlipidemia associated with type 2 diabetes mellitus ([place]) 12/07/2015  . Hypertension associated with diabetes ([place]) 12/04/2014    Willow Ora, PTA, [facility] [address], [place], [place] [address] [phone] 04/19/2016, 7:07 PM   Name: Adam [name] MRN: [id number] Date of Birth: 08-31-1951

## 2016-04-21 ENCOUNTER — Encounter: Payer: Self-pay | Admitting: Physical Therapy

## 2016-04-21 ENCOUNTER — Ambulatory Visit: Payer: Medicare Other | Admitting: Physical Therapy

## 2016-04-21 DIAGNOSIS — E1169 Type 2 diabetes mellitus with other specified complication: Secondary | ICD-10-CM

## 2016-04-21 DIAGNOSIS — R2681 Unsteadiness on feet: Secondary | ICD-10-CM

## 2016-04-21 DIAGNOSIS — Z89611 Acquired absence of right leg above knee: Secondary | ICD-10-CM | POA: Diagnosis not present

## 2016-04-21 DIAGNOSIS — R2689 Other abnormalities of gait and mobility: Secondary | ICD-10-CM

## 2016-04-21 DIAGNOSIS — S78111A Complete traumatic amputation at level between right hip and knee, initial encounter: Secondary | ICD-10-CM

## 2016-04-21 DIAGNOSIS — R29898 Other symptoms and signs involving the musculoskeletal system: Secondary | ICD-10-CM | POA: Diagnosis not present

## 2016-04-21 DIAGNOSIS — Z89511 Acquired absence of right leg below knee: Secondary | ICD-10-CM | POA: Diagnosis not present

## 2016-04-21 DIAGNOSIS — E785 Hyperlipidemia, unspecified: Secondary | ICD-10-CM

## 2016-04-21 NOTE — Therapy (Signed)
[facility] [address] [place], [place], [address] Phone: [phone]   Fax:  [phone]  Physical Therapy Treatment  Patient Details  Name: Adam [name] MRN: [id number] Date of Birth: 16-Jan-1951 Referring Provider: [name], MD  Encounter Date: 04/21/2016      PT End of Session - 04/21/16 1644    Visit Number 10   Number of Visits 17   Date for PT Re-Evaluation 05/19/16   Authorization Type Medicare G-code & progress note   PT Start Time 1105   PT Stop Time 1148   PT Time Calculation (min) 43 min   Equipment Utilized During Treatment Gait belt   Activity Tolerance Patient tolerated treatment well;No increased pain   Behavior During Therapy [facility] for tasks assessed/performed      Past Medical History  Diagnosis Date  . Hypertension   . Peripheral vascular disease ([place])   . Gangrene ([place]) 11/22/2015    right great toe  . OSA on CPAP   . Type II diabetes mellitus ([place]) dx'd 11/2015  . PAD (peripheral artery disease) [facility])     Past Surgical History  Procedure Laterality Date  . Peripheral vascular catheterization N/A 11/26/2015    Procedure: Abdominal Aortogram;  Surgeon: [name], MD;  Location: [place] CV LAB;  Service: Cardiovascular;  Laterality: N/A;  . Vasectomy    . Colonoscopy    . Femoral-popliteal bypass graft Right 11/28/2015    Procedure: RIGHT  FEMORAL-POPLITEAL ARTERY BYPASS GRAFT AND CONSTRUCTION OF MILLER CUFF USING 6 MM X 80 CM PROPATEN  GRAFT ;  Surgeon: Conrad [place], MD;  Location: [place];  Service: Vascular;  Laterality: Right;  . Amputation Right 12/01/2015    Procedure: AMPUTATION RIGHT GREAT;  Surgeon: Conrad [place], MD;  Location: [place];  Service: Vascular;  Laterality: Right;  . Amputation Right 12/16/2015    Procedure: AMPUTATION BELOW KNEE;  Surgeon: [name], MD;  Location: [place];  Service: Vascular;  Laterality: Right;  . Amputation Right 12/26/2015    Procedure:  RIGHT ABOVE KNEE AMPUTATION;  Surgeon: [name], MD;  Location: [place];  Service: Vascular;  Laterality: Right;  . Inguinal hernia repair Bilateral 2000    There were no vitals filed for this visit.      Subjective Assessment - 04/21/16 1110    Subjective Over the weekend had LOB and cut thumb on garage door; no fall.   Pertinent History DM, HTN   Limitations Lifting;Standing;Walking   Patient Stated Goals Use prosthesis to play golf, go to beach house including boating, yardwork   Currently in Pain? No/denies                         [facility] Adult PT Treatment/Exercise - 04/21/16 0001    Ambulation/Gait   Ambulation/Gait Yes   Ambulation/Gait Assistance 4: Min guard   Ambulation/Gait Assistance Details occasional left foot drag esp. with turns or when changes in stride length was required   Ambulation Distance (Feet) 300 Feet   Assistive device Straight cane;Prosthesis   Gait Pattern Decreased step length - left;Decreased stride length;Decreased hip/knee flexion - right;Decreased weight shift to right;Poor foot clearance - right;Decreased stance time - right;Narrow base of support   Ambulation Surface Level;Unlevel   Stairs Yes   Stairs Assistance 4: Min guard   Stairs Assistance Details (indicate cue type and reason) progressed from [facility] and rail to no rail to work on  balance   Stair Management Technique One rail Right;No rails;Alternating pattern;Step to pattern   Number of Stairs 16   Door Management --   Door Managment Details (indicate cue type and reason) with SPC and prosthesis working on step through pattern ascending and step to pattern descending  cues for momentum and technique   Ramp 4: Min assist   Prosthetics   Current prosthetic wear tolerance (days/week)  daily   Current prosthetic wear tolerance (#hours/day)  all awake hours except for 1 hour midday   Current prosthetic weight-bearing tolerance (hours/day)  tolerated standing & gait >15 minutes  with no c/o pain or discomfort             Balance Exercises - 04/21/16 1633    Balance Exercises: Standing   Other Standing Exercises Working on wt shifts on the prosthesis breaking down mechanics for stepping forward with 1 UEsupport in parallel bars.  progressed from stepping to gait, supervision, cues for technique           PT Education - 04/21/16 1643    Education provided Yes   Education Details mechanics for stepping forward with prosthesis with less UE support   Person(s) Educated Patient   Methods Explanation;Demonstration;Tactile cues;Verbal cues   Comprehension Verbalized understanding;Returned demonstration;Verbal cues required;Tactile cues required;Need further instruction          PT Short Term Goals - 04/16/16 1245    PT SHORT TERM GOAL #1   Title Patient tolerates wear of prosthesis >10hrs total /day without skin issues or limb pain. (Target Date: 04/18/2016)   Baseline Met: 04/16/16   Time 4   Period Weeks   Status Achieved   PT SHORT TERM GOAL #2   Title Patient demonstrates proper donning & verbalizes proper cleaning of prosthesis.  (Target Date: 04/18/2016)   Baseline Met: 04/16/16   Time 4   Period Weeks   Status Achieved   PT SHORT TERM GOAL #3   Title Patient ambulates 200' with crutches & prosthesis safely with cues only for deviations not physical assist.  (Target Date: 04/18/2016)   Baseline Met: 04/16/16   Time 4   Period Weeks   Status Achieved   PT SHORT TERM GOAL #4   Title Patient negotiates ramps, curbs and stairs (1 rail) with crutches with supervision.  (Target Date: 04/18/2016)   Baseline Met: 04/16/16   Time 4   Period Weeks   Status Achieved   PT SHORT TERM GOAL #5   Title Patient able to reach 7" anteriorly, laterally, across midline and pick up object from floor with prosthesis with supervision.  (Target Date: 04/18/2016)   Baseline Pt able to reach anteriorally, laterally and across midline 7" with supervision, picking object up  from floor Min A    Time 4   Period Weeks   Status Partially Met           PT Long Term Goals - 04/02/16 2213    PT LONG TERM GOAL #1   Title Patient verbalizes & demonstrates understanding of prosthetic care to enable safe use of prosthesis. (Target Date: 05/16/2016)   Time 8   Period Weeks   Status On-going   PT LONG TERM GOAL #2   Title Patient tolerates wear of prosthesis >90% of awake hours without skin issues or limb tenderness to enable function throughout his awake hours.  (Target Date: 05/16/2016)   Time 8   Period Weeks   Status On-going   PT LONG TERM GOAL #3  Title [employer] Test > 45/56 to indicate lower fall risk.  (Target Date: 05/16/2016)   Time 8   Period Weeks   Status On-going   PT LONG TERM GOAL #4   Title Patient ambulates 500' with LRAD & prosthesis outside including grass, ramps & curbs modified independent for community access.  (Target Date: 05/16/2016)   Time 8   Period Weeks   Status On-going   PT LONG TERM GOAL #5   Title Patient ambulates around furniture carrrying plate & cup with only prosthesis independently.  (Target Date: 05/16/2016)   Time 8   Period Weeks   Status On-going   PT LONG TERM GOAL #6   Title Patient able to transfer on/off ground and demonstrate yard work and golf swing with prosthesis modified independent to return to leisure activities.  (Target Date: 05/16/2016)   Time 8   Period Weeks   Status On-going               Plan - 04/22/2016 1645    Clinical Impression Statement Skilled session focused on progessing with gait mechanics with 1 UE support and gait with SPC to negotiate community obstacles; pt requires min guard to min A.   Rehab Potential Good   PT Frequency 2x / week   PT Duration 8 weeks   PT Treatment/Interventions ADLs/Self Care Home Management;DME Instruction;Gait training;Stair training;Functional mobility training;Therapeutic activities;Therapeutic exercise;Balance training;Neuromuscular  re-education;Patient/family education;Prosthetic Training   PT Next Visit Plan  continue with [facility] for gait and balance activities.   Consulted and Agree with Plan of Care Patient      Patient will benefit from skilled therapeutic intervention in order to improve the following deficits and impairments:  Abnormal gait, Decreased activity tolerance, Decreased balance, Decreased endurance, Decreased mobility, Postural dysfunction, Prosthetic Dependency  Visit Diagnosis: Other abnormalities of gait and mobility  Unsteadiness on feet  Other symptoms and signs involving the musculoskeletal system  Above knee amputation of right lower extremity (HCC)  Hyperlipidemia associated with type 2 diabetes mellitus ([place])  Status post below knee amputation of right lower extremity [facility])     Problem List Patient Active Problem List   Diagnosis Date Noted  . Type 2 diabetes with complication ([place]) 55/73/2202  . Phantom pain after amputation of lower extremity ([place]) 01/18/2016  . Neuropathic pain   . Above knee amputation of right lower extremity ([place]) 12/29/2015  . PAD (peripheral artery disease) ([place]) 12/15/2015  . Hyperlipidemia associated with type 2 diabetes mellitus ([place]) 12/07/2015  . Hypertension associated with diabetes ([place]) 12/04/2014    Bjorn Loser, PTA  04-22-2016, 4:49 PM [place] [address] [place], [place], [address] Phone: [phone]   Fax:  [phone]  Name: TREK KIMBALL MRN: [id number] Date of Birth: 03/22/1951       G-Codes - April 22, 2016 1600    Functional Assessment Tool Used Wearing prosthesis daily for all awake hours except 1 hr mid-day with no skin issues or limb tenderness. He verbalizes proper donning & cleaning but needs cues for adjusting ply socks.    Functional Limitation Self care   Self Care Current Status [phone]) At least 20 percent but less than 40 percent impaired, limited or  restricted   Self Care Goal Status (J0093) At least 1 percent but less than 20 percent impaired, limited or restricted      Physical Therapy Progress Note  Dates of Reporting Period: 03/20/2016 to 04-22-16  Objective Reports of Subjective Statement: Patient reports  increased wear & gait with prosthesis & crutches with improved activity level.   Objective Measurements: see above  Goal Update: see above  Plan: continue established plan of care  Reason Skilled Services are Required: Patient requires skilled instruction to progress safe use, balance & gait with Transfemoral prosthesis.   Jamey Reas, PT, DPT PT Specializing in [place] 04/22/2016 8:54 AM Phone:  [phone]  Fax:  [phone] [place] [address] [place], [place] [address]

## 2016-04-23 ENCOUNTER — Encounter: Payer: Self-pay | Admitting: Physical Therapy

## 2016-04-23 ENCOUNTER — Ambulatory Visit: Payer: Medicare Other | Admitting: Physical Therapy

## 2016-04-23 DIAGNOSIS — R29898 Other symptoms and signs involving the musculoskeletal system: Secondary | ICD-10-CM

## 2016-04-23 DIAGNOSIS — E1169 Type 2 diabetes mellitus with other specified complication: Secondary | ICD-10-CM | POA: Diagnosis not present

## 2016-04-23 DIAGNOSIS — R2689 Other abnormalities of gait and mobility: Secondary | ICD-10-CM

## 2016-04-23 DIAGNOSIS — R2681 Unsteadiness on feet: Secondary | ICD-10-CM

## 2016-04-23 DIAGNOSIS — Z89611 Acquired absence of right leg above knee: Secondary | ICD-10-CM | POA: Diagnosis not present

## 2016-04-23 DIAGNOSIS — Z89511 Acquired absence of right leg below knee: Secondary | ICD-10-CM | POA: Diagnosis not present

## 2016-04-23 NOTE — Therapy (Signed)
[facility] [address] [place], [place], [address] Phone: [phone]   Fax:  [phone]  Physical Therapy Treatment  Patient Details  Name: Brown BASSO MRN: [id number] Date of Birth: 1951/06/02 Referring Provider: Gretta Began, MD  Encounter Date: 04/23/2016      PT End of Session - 04/23/16 1219    Visit Number 11   Number of Visits 17   Date for PT Re-Evaluation 05/19/16   Authorization Type Medicare G-code & progress note   PT Start Time 1016   PT Stop Time 1100   PT Time Calculation (min) 44 min   Equipment Utilized During Treatment Gait belt   Activity Tolerance Patient tolerated treatment well;No increased pain   Behavior During Therapy [facility] for tasks assessed/performed      Past Medical History  Diagnosis Date  . Hypertension   . Peripheral vascular disease (HCC)   . Gangrene (HCC) 11/22/2015    right great toe  . OSA on CPAP   . Type II diabetes mellitus (HCC) dx'd 11/2015  . PAD (peripheral artery disease) [facility])     Past Surgical History  Procedure Laterality Date  . Peripheral vascular catheterization N/A 11/26/2015    Procedure: Abdominal Aortogram;  Surgeon: Chuck Hint, MD;  Location: [facility] INVASIVE CV LAB;  Service: Cardiovascular;  Laterality: N/A;  . Vasectomy    . Colonoscopy    . Femoral-popliteal bypass graft Right 11/28/2015    Procedure: RIGHT  FEMORAL-POPLITEAL ARTERY BYPASS GRAFT AND CONSTRUCTION OF MILLER CUFF USING 6 MM X 80 CM PROPATEN  GRAFT ;  Surgeon: Fransisco Hertz, MD;  [facility];  Service: Vascular;  Laterality: Right;  . Amputation Right 12/01/2015    Procedure: AMPUTATION RIGHT GREAT;  Surgeon: Fransisco Hertz, MD;  Location: [facility] OR;  Service: Vascular;  Laterality: Right;  . Amputation Right 12/16/2015    Procedure: AMPUTATION BELOW KNEE;  Surgeon: Larina Earthly, MD;  Location: [facility] OR;  Service: Vascular;  Laterality: Right;  . Amputation Right 12/26/2015    Procedure:  RIGHT ABOVE KNEE AMPUTATION;  Surgeon: Larina Earthly, MD;  Location: [facility] OR;  Service: Vascular;  Laterality: Right;  . Inguinal hernia repair Bilateral 2000    There were no vitals filed for this visit.      Subjective Assessment - 04/23/16 1018    Subjective (p) The bottom of limb was tender after last PT session. He saw prosthetist who reports he can see improvement. No falls.    Pertinent History (p) DM, HTN   Limitations (p) Lifting;Standing;Walking   Patient Stated Goals (p) Use prosthesis to play golf, go to beach house including boating, yardwork   Currently in Pain? (p) No/denies                         [facility] Adult PT Treatment/Exercise - 04/23/16 1015    Transfers   Transfers Floor to Transfer   Floor to Transfer 5: Supervision;With upper extremity assist  using BUE support on chair bottom   Floor to Transfer Details (indicate cue type and reason) PT demo & instructed technique with TFA prosthesis. Pt return demo understanding.    Ambulation/Gait   Ambulation/Gait Yes   Ambulation/Gait Assistance 4: Min guard   Ambulation/Gait Assistance Details verbal & tactile cues on posture, step length, step width & weight shift.    Ambulation Distance (Feet) 300 Feet  300', 50' X 2   Assistive device Straight  cane;Prosthesis   Gait Pattern Decreased step length - left;Decreased stride length;Decreased hip/knee flexion - right;Decreased weight shift to right;Poor foot clearance - right;Decreased stance time - right;Narrow base of support   Ambulation Surface Indoor;Level   Stairs Yes   Stairs Assistance 5: Supervision   Stairs Assistance Details (indicate cue type and reason) tactile & verbal cues on weight shift to load prosthesis. Verbal cues on technique with single rail available   Stair Management Technique One rail Right;No rails;Alternating pattern;Step to pattern   Number of Stairs 8   Gait Comments Treadmill with Gait Trainer program for visual feedback. 77mn  avg speed 1.457m top speed 1.74m174mwith improved consistency of step length and time on each foot.    Exercises   Exercises Knee/Hip   Knee/Hip Exercises: Stretches   Hip Flexor Stretch 1 rep;30 seconds;Right   Hip Flexor Stretch Limitations Thomas position stretch & 1/2 kneeling stretch   Prosthetics   Current prosthetic wear tolerance (days/week)  daily   Current prosthetic wear tolerance (#hours/day)  all awake hours except for 1 hour midday   Current prosthetic weight-bearing tolerance (hours/day)  tolerated standing & gait >15 minutes with no c/o pain or discomfort             Balance Exercises - 04/23/16 1015    Balance Exercises: Standing   SLS with Vectors Solid surface;5 reps  stepping prosthesis adduction, in line, abduction, in line   Other Standing Exercises standing with equal weight on LEs: red theraband reciprocal then BUE 10 reps each: forward reach, row, upward reach           PT Education - 04/23/16 1100    Education provided Yes   Education Details hip flexors stretches and how tightness can cause distal anterior pain in prosthesis   Person(s) Educated Patient   Methods Explanation;Demonstration;Tactile cues;Verbal cues   Comprehension Verbalized understanding          PT Short Term Goals - 04/16/16 1245    PT SHORT TERM GOAL #1   Title Patient tolerates wear of prosthesis >10hrs total /day without skin issues or limb pain. (Target Date: 04/18/2016)   Baseline Met: 04/16/16   Time 4   Period Weeks   Status Achieved   PT SHORT TERM GOAL #2   Title Patient demonstrates proper donning & verbalizes proper cleaning of prosthesis.  (Target Date: 04/18/2016)   Baseline Met: 04/16/16   Time 4   Period Weeks   Status Achieved   PT SHORT TERM GOAL #3   Title Patient ambulates 200' with crutches & prosthesis safely with cues only for deviations not physical assist.  (Target Date: 04/18/2016)   Baseline Met: 04/16/16   Time 4   Period Weeks   Status Achieved    PT SHORT TERM GOAL #4   Title Patient negotiates ramps, curbs and stairs (1 rail) with crutches with supervision.  (Target Date: 04/18/2016)   Baseline Met: 04/16/16   Time 4   Period Weeks   Status Achieved   PT SHORT TERM GOAL #5   Title Patient able to reach 7" anteriorly, laterally, across midline and pick up object from floor with prosthesis with supervision.  (Target Date: 04/18/2016)   Baseline Pt able to reach anteriorally, laterally and across midline 7" with supervision, picking object up from floor Min A    Time 4   Period Weeks   Status Partially Met           PT Long Term Goals -  04/02/16 2213    PT LONG TERM GOAL #1   Title Patient verbalizes & demonstrates understanding of prosthetic care to enable safe use of prosthesis. (Target Date: 05/16/2016)   Time 8   Period Weeks   Status On-going   PT LONG TERM GOAL #2   Title Patient tolerates wear of prosthesis >90% of awake hours without skin issues or limb tenderness to enable function throughout his awake hours.  (Target Date: 05/16/2016)   Time 8   Period Weeks   Status On-going   PT LONG TERM GOAL #3   Title [employer] Balance Test > 45/56 to indicate lower fall risk.  (Target Date: 05/16/2016)   Time 8   Period Weeks   Status On-going   PT LONG TERM GOAL #4   Title Patient ambulates 500' with LRAD & prosthesis outside including grass, ramps & curbs modified independent for community access.  (Target Date: 05/16/2016)   Time 8   Period Weeks   Status On-going   PT LONG TERM GOAL #5   Title Patient ambulates around furniture carrrying plate & cup with only prosthesis independently.  (Target Date: 05/16/2016)   Time 8   Period Weeks   Status On-going   PT LONG TERM GOAL #6   Title Patient able to transfer on/off ground and demonstrate yard work and golf swing with prosthesis modified independent to return to leisure activities.  (Target Date: 05/16/2016)   Time 8   Period Weeks   Status On-going                Plan - 04/23/16 1220    Clinical Impression Statement Patient has tenderness at anterior distal limb with activity to increase prosthetic weight bearing last session. He has tightness in hip flexors that also may be component to the pain. Patient is improving balance & prosthetic gait with cane   Rehab Potential Good   PT Frequency 2x / week   PT Duration 8 weeks   PT Treatment/Interventions ADLs/Self Care Home Management;DME Instruction;Gait training;Stair training;Functional mobility training;Therapeutic activities;Therapeutic exercise;Balance training;Neuromuscular re-education;Patient/family education;Prosthetic Training   PT Next Visit Plan  continue with [facility] for gait and balance activities.   Consulted and Agree with Plan of Care Patient      Patient will benefit from skilled therapeutic intervention in order to improve the following deficits and impairments:  Abnormal gait, Decreased activity tolerance, Decreased balance, Decreased endurance, Decreased mobility, Postural dysfunction, Prosthetic Dependency  Visit Diagnosis: Other abnormalities of gait and mobility  Unsteadiness on feet  Other symptoms and signs involving the musculoskeletal system     Problem List Patient Active Problem List   Diagnosis Date Noted  . Type 2 diabetes with complication ([place]) 59/74/1638  . Phantom pain after amputation of lower extremity ([place]) 01/18/2016  . Neuropathic pain   . Above knee amputation of right lower extremity ([place]) 12/29/2015  . PAD (peripheral artery disease) ([place]) 12/15/2015  . Hyperlipidemia associated with type 2 diabetes mellitus ([place]) 12/07/2015  . Hypertension associated with diabetes ([place]) 12/04/2014    Jamin Humphries PT, DPT 04/23/2016, 12:23 PM  [place] [address] [place], [place], [address] Phone: [phone]   Fax:  [phone]  Name: BOOKER BHATNAGAR MRN: [id number] Date of Birth:  December 01, 1950

## 2016-04-28 ENCOUNTER — Encounter: Payer: Medicare Other | Admitting: Physical Therapy

## 2016-04-30 ENCOUNTER — Encounter: Payer: Medicare Other | Admitting: Physical Therapy

## 2016-05-01 ENCOUNTER — Ambulatory Visit: Payer: Medicare Other | Admitting: Physical Therapy

## 2016-05-04 ENCOUNTER — Other Ambulatory Visit: Payer: Self-pay | Admitting: Family Medicine

## 2016-05-05 ENCOUNTER — Encounter: Payer: Self-pay | Admitting: Physical Therapy

## 2016-05-05 ENCOUNTER — Ambulatory Visit: Payer: Medicare Other | Admitting: Physical Therapy

## 2016-05-05 DIAGNOSIS — R2681 Unsteadiness on feet: Secondary | ICD-10-CM | POA: Diagnosis not present

## 2016-05-05 DIAGNOSIS — Z89511 Acquired absence of right leg below knee: Secondary | ICD-10-CM | POA: Diagnosis not present

## 2016-05-05 DIAGNOSIS — R2689 Other abnormalities of gait and mobility: Secondary | ICD-10-CM

## 2016-05-05 DIAGNOSIS — E1169 Type 2 diabetes mellitus with other specified complication: Secondary | ICD-10-CM | POA: Diagnosis not present

## 2016-05-05 DIAGNOSIS — R29898 Other symptoms and signs involving the musculoskeletal system: Secondary | ICD-10-CM

## 2016-05-05 DIAGNOSIS — Z89611 Acquired absence of right leg above knee: Secondary | ICD-10-CM | POA: Diagnosis not present

## 2016-05-05 DIAGNOSIS — S78111A Complete traumatic amputation at level between right hip and knee, initial encounter: Secondary | ICD-10-CM

## 2016-05-05 NOTE — Telephone Encounter (Signed)
Is this okay by what is said in last note he is on lisinopril hctz

## 2016-05-05 NOTE — Therapy (Signed)
[facility] [address] [place], [place], [address] Phone: [phone]   Fax:  [phone]  Physical Therapy Treatment  Patient Details  Name: Adam [name] MRN: [id number] Date of Birth: 1951-04-15 Referring Provider: Gretta Began, MD  Encounter Date: 05/05/2016      PT End of Session - 05/05/16 1610    Visit Number 12   Number of Visits 17   Date for PT Re-Evaluation 05/19/16   Authorization Type Medicare G-code & progress note   PT Start Time 1020   PT Stop Time 1104   PT Time Calculation (min) 44 min   Equipment Utilized During Treatment Gait belt   Activity Tolerance Patient tolerated treatment well;No increased pain   Behavior During Therapy [facility] for tasks assessed/performed      Past Medical History  Diagnosis Date  . Hypertension   . Peripheral vascular disease (HCC)   . Gangrene (HCC) 11/22/2015    right great toe  . OSA on CPAP   . Type II diabetes mellitus (HCC) dx'd 11/2015  . PAD (peripheral artery disease) [facility])     Past Surgical History  Procedure Laterality Date  . Peripheral vascular catheterization N/A 11/26/2015    Procedure: Abdominal Aortogram;  Surgeon: Chuck Hint, MD;  Location: [facility] INVASIVE CV LAB;  Service: Cardiovascular;  Laterality: N/A;  . Vasectomy    . Colonoscopy    . Femoral-popliteal bypass graft Right 11/28/2015    Procedure: RIGHT  FEMORAL-POPLITEAL ARTERY BYPASS GRAFT AND CONSTRUCTION OF MILLER CUFF USING 6 MM X 80 CM PROPATEN  GRAFT ;  Surgeon: Fransisco Hertz, MD;  Location: [facility] OR;  Service: Vascular;  Laterality: Right;  . Amputation Right 12/01/2015    Procedure: AMPUTATION RIGHT GREAT;  Surgeon: Fransisco Hertz, MD;  Location: [facility] OR;  Service: Vascular;  Laterality: Right;  . Amputation Right 12/16/2015    Procedure: AMPUTATION BELOW KNEE;  Surgeon: Larina Earthly, MD;  Location: [facility] OR;  Service: Vascular;  Laterality: Right;  . Amputation Right 12/26/2015    Procedure:  RIGHT ABOVE KNEE AMPUTATION;  Surgeon: Larina Earthly, MD;  Location: [facility] OR;  Service: Vascular;  Laterality: Right;  . Inguinal hernia repair Bilateral 2000    There were no vitals filed for this visit.      Subjective Assessment - 05/05/16 1022    Subjective  No falls.    Pertinent History DM, HTN   Limitations Lifting;Standing;Walking   Patient Stated Goals Use prosthesis to play golf, go to [employer] including boating, yardwork                         [facility] Adult PT Treatment/Exercise - 05/05/16 0001    Ambulation/Gait   Ambulation/Gait Yes   Ambulation/Gait Assistance 5: Supervision   Ambulation/Gait Assistance Details verbal & tactile cues on posture, step length, step width & weight shift.    Ambulation Distance (Feet) 300 Feet  115   Assistive device Straight cane;Prosthesis   Gait Pattern Decreased step length - left;Decreased stride length;Decreased hip/knee flexion - right;Decreased weight shift to right;Poor foot clearance - right;Decreased stance time - right;Narrow base of support   Ambulation Surface Level   Ramp 4: Min assist  HHA for ascending ramp for working on step through pattern/    Gait Comments Treadmill with Pharmacist, hospital program for visual feedback. avg speed 1. top speed 1. with improved consistency of step length and time on each  foot.   Multiple LOB when Right toe of prosthesis dragged requiring Min A and treadmill needing to be stopped   Knee/Hip Exercises: Aerobic   Tread Mill ttt   Prosthetics   Current prosthetic wear tolerance (days/week)  daily   Current prosthetic wear tolerance (#hours/day)  all awake hours except for 1 hour midday   Current prosthetic weight-bearing tolerance (hours/day)  tolerated standing & gait >15 minutes with no c/o pain or discomfort                PT Education - 05/05/16 1609    Education provided Yes   Education Details Therapist, sports) Educated Patient   Methods  Explanation   Comprehension Verbalized understanding;Returned demonstration;Verbal cues required;Tactile cues required;Need further instruction          PT Short Term Goals - 04/16/16 1245    PT SHORT TERM GOAL #1   Title Patient tolerates wear of prosthesis >10hrs total /day without skin issues or limb pain. (Target Date: 04/18/2016)   Baseline Met: 04/16/16   Time 4   Period Weeks   Status Achieved   PT SHORT TERM GOAL #2   Title Patient demonstrates proper donning & verbalizes proper cleaning of prosthesis.  (Target Date: 04/18/2016)   Baseline Met: 04/16/16   Time 4   Period Weeks   Status Achieved   PT SHORT TERM GOAL #3   Title Patient ambulates 200' with crutches & prosthesis safely with cues only for deviations not physical assist.  (Target Date: 04/18/2016)   Baseline Met: 04/16/16   Time 4   Period Weeks   Status Achieved   PT SHORT TERM GOAL #4   Title Patient negotiates ramps, curbs and stairs (1 rail) with crutches with supervision.  (Target Date: 04/18/2016)   Baseline Met: 04/16/16   Time 4   Period Weeks   Status Achieved   PT SHORT TERM GOAL #5   Title Patient able to reach 7" anteriorly, laterally, across midline and pick up object from floor with prosthesis with supervision.  (Target Date: 04/18/2016)   Baseline Pt able to reach anteriorally, laterally and across midline 7" with supervision, picking object up from floor Min A    Time 4   Period Weeks   Status Partially Met           PT Long Term Goals - 04/02/16 2213    PT LONG TERM GOAL #1   Title Patient verbalizes & demonstrates understanding of prosthetic care to enable safe use of prosthesis. (Target Date: 05/16/2016)   Time 8   Period Weeks   Status On-going   PT LONG TERM GOAL #2   Title Patient tolerates wear of prosthesis >90% of awake hours without skin issues or limb tenderness to enable function throughout his awake hours.  (Target Date: 05/16/2016)   Time 8   Period Weeks   Status On-going    PT LONG TERM GOAL #3   Title Berg Balance Test > 45/56 to indicate lower fall risk.  (Target Date: 05/16/2016)   Time 8   Period Weeks   Status On-going   PT LONG TERM GOAL #4   Title Patient ambulates 500' with LRAD & prosthesis outside including grass, ramps & curbs modified independent for community access.  (Target Date: 05/16/2016)   Time 8   Period Weeks   Status On-going   PT LONG TERM GOAL #5   Title Patient ambulates around furniture carrrying plate & cup with only prosthesis  independently.  (Target Date: 05/16/2016)   Time 8   Period Weeks   Status On-going   PT LONG TERM GOAL #6   Title Patient able to transfer on/off ground and demonstrate yard work and golf swing with prosthesis modified independent to return to leisure activities.  (Target Date: 05/16/2016)   Time 8   Period Weeks   Status On-going               Plan - 05/05/16 1611    Clinical Impression Statement Pt reports no tenderness in residual limb today.  Continues to need Min A with ramp negotiation using SPC and continues to have difficulty sufficiently wt shifting to the right during gait to consistently unlock prosthetic knee.   Rehab Potential Good   PT Frequency 2x / week   PT Duration 8 weeks   PT Treatment/Interventions ADLs/Self Care Home Management;DME Instruction;Gait training;Stair training;Functional mobility training;Therapeutic activities;Therapeutic exercise;Balance training;Neuromuscular re-education;Patient/family education;Prosthetic Training   PT Next Visit Plan  continue with [facility] for gait and balance activities.   Consulted and Agree with Plan of Care Patient      Patient will benefit from skilled therapeutic intervention in order to improve the following deficits and impairments:  Abnormal gait, Decreased activity tolerance, Decreased balance, Decreased endurance, Decreased mobility, Postural dysfunction, Prosthetic Dependency  Visit Diagnosis: Other abnormalities of gait and  mobility  Unsteadiness on feet  Other symptoms and signs involving the musculoskeletal system  Above knee amputation of right lower extremity (HCC)  Status post below knee amputation of right lower extremity [facility])     Problem List Patient Active Problem List   Diagnosis Date Noted  . Type 2 diabetes with complication ([place]) 11/25/3233  . Phantom pain after amputation of lower extremity ([place]) 01/18/2016  . Neuropathic pain   . Above knee amputation of right lower extremity ([place]) 12/29/2015  . PAD (peripheral artery disease) ([place]) 12/15/2015  . Hyperlipidemia associated with type 2 diabetes mellitus ([place]) 12/07/2015  . Hypertension associated with diabetes ([place]) 12/04/2014    Bjorn Loser, PTA  05/05/2016, 4:15 PM [facility] [address] [place], [place], [address] Phone: [phone]   Fax:  [phone]  Name: Adam [name] MRN: [id number] Date of Birth: 10-Jan-1951

## 2016-05-07 ENCOUNTER — Ambulatory Visit: Payer: Medicare Other | Admitting: Physical Therapy

## 2016-05-07 DIAGNOSIS — R2681 Unsteadiness on feet: Secondary | ICD-10-CM

## 2016-05-07 DIAGNOSIS — R2689 Other abnormalities of gait and mobility: Secondary | ICD-10-CM

## 2016-05-07 DIAGNOSIS — R29898 Other symptoms and signs involving the musculoskeletal system: Secondary | ICD-10-CM

## 2016-05-07 DIAGNOSIS — Z89511 Acquired absence of right leg below knee: Secondary | ICD-10-CM | POA: Diagnosis not present

## 2016-05-07 DIAGNOSIS — E1169 Type 2 diabetes mellitus with other specified complication: Secondary | ICD-10-CM | POA: Diagnosis not present

## 2016-05-07 DIAGNOSIS — Z89611 Acquired absence of right leg above knee: Secondary | ICD-10-CM | POA: Diagnosis not present

## 2016-05-07 NOTE — Therapy (Signed)
[facility] [address] [place], [place], [address] Phone: [phone]   Fax:  [phone]  Physical Therapy Treatment  Patient Details  Name: Adam [name] MRN: [id number] Date of Birth: 23-Nov-1950 Referring Provider: [name], MD  Encounter Date: 05/07/2016      PT End of Session - 05/07/16 1712    Visit Number 13   Number of Visits 17   Date for PT Re-Evaluation 05/19/16   Authorization Type Medicare G-code & progress note   PT Start Time 1018   PT Stop Time 1100   PT Time Calculation (min) 42 min   Equipment Utilized During Treatment Gait belt   Activity Tolerance Patient tolerated treatment well;No increased pain   Behavior During Therapy [facility] for tasks assessed/performed      Past Medical History  Diagnosis Date  . Hypertension   . Peripheral vascular disease ([place])   . Gangrene ([place]) 11/22/2015    right great toe  . OSA on CPAP   . Type II diabetes mellitus ([place]) dx'd 11/2015  . PAD (peripheral artery disease) [facility])     Past Surgical History  Procedure Laterality Date  . Peripheral vascular catheterization N/A 11/26/2015    Procedure: Abdominal Aortogram;  Surgeon: [name], MD;  Location: [place] CV LAB;  Service: Cardiovascular;  Laterality: N/A;  . Vasectomy    . Colonoscopy    . Femoral-popliteal bypass graft Right 11/28/2015    Procedure: RIGHT  FEMORAL-POPLITEAL ARTERY BYPASS GRAFT AND CONSTRUCTION OF MILLER CUFF USING 6 MM X 80 CM PROPATEN  GRAFT ;  Surgeon: Conrad [place], MD;  Location: [place];  Service: Vascular;  Laterality: Right;  . Amputation Right 12/01/2015    Procedure: AMPUTATION RIGHT GREAT;  Surgeon: Conrad , MD;  Location: [place];  Service: Vascular;  Laterality: Right;  . Amputation Right 12/16/2015    Procedure: AMPUTATION BELOW KNEE;  Surgeon: [name], MD;  Location: [place];  Service: Vascular;  Laterality: Right;  . Amputation Right 12/26/2015    Procedure:  RIGHT ABOVE KNEE AMPUTATION;  Surgeon: [name], MD;  Location: [place];  Service: Vascular;  Laterality: Right;  . Inguinal hernia repair Bilateral 2000    There were no vitals filed for this visit.      Subjective Assessment - 05/07/16 1020    Subjective No falls or issues since last visit, been to beach and out on the boat since last seen with no issues   Pertinent History DM, HTN   Limitations Lifting;Standing;Walking   Patient Stated Goals Use prosthesis to play golf, go to beach house including boating, yardwork   Currently in Pain? No/denies                         [facility] Adult PT Treatment/Exercise - 05/07/16 1015    Ambulation/Gait   Ambulation/Gait Yes   Ambulation/Gait Assistance 4: Min guard;5: Supervision  Min gaurd with [place] outside, [place] treadmill   Ambulation/Gait Assistance Details With SPC outside over grass and paved surfaces, horizontal head turns on paved surfaces with minimal lose of balacne. Verbal cuing for increasing hip motion in grass to advance prosthesis.    Ambulation Distance (Feet) 230 Feet  x1, 266 meters   Assistive device Straight cane;Prosthesis   Gait Pattern Decreased step length - left;Decreased stride length;Decreased hip/knee flexion - right;Decreased weight shift to right;Poor foot clearance - right;Decreased stance time - right;Narrow base of support  Ambulation Surface Level;Unlevel;Indoor;Outdoor;Paved;Gravel;Grass   Ramp 4: Min assist  HHA for ascending ramp for working on step through pattern/    Ramp Details (indicate cue type and reason) 4 reps continued verbal cues for unlocking prosthesis while ascending ramp to increase safety   Curb 5: Supervision   Curb Details (indicate cue type and reason) with SPC outside   Gait Comments 6 minutes on treadmill avg speed 1.7 mph. Decreased stance time on right and uneven step length  Multiple LOB when Right toe of prosthesis dragged requiring    Neuro Re-ed    Neuro  Re-ed Details  Reaching for tissue box out of base of support at counter using SPC, verbal and tactile cues for shifting weight before reaching and maintaining uprihgt posturing while shifting   Knee/Hip Exercises: Aerobic   Tread Mill ttt   Prosthetics   Current prosthetic wear tolerance (days/week)  daily   Current prosthetic wear tolerance (#hours/day)  all awake hours except for 1 hour midday   Current prosthetic weight-bearing tolerance (hours/day)  tolerated standing & gait >15 minutes with no c/o pain or discomfort                PT Education - 05/07/16 1022    Education provided Yes   Education Details Wiping feet at door with prosthesis   Person(s) Educated Patient   Methods Explanation;Demonstration;Verbal cues   Comprehension Verbalized understanding;Returned demonstration;Verbal cues required          PT Short Term Goals - 04/16/16 1245    PT SHORT TERM GOAL #1   Title Patient tolerates wear of prosthesis >10hrs total /day without skin issues or limb pain. (Target Date: 04/18/2016)   Baseline Met: 04/16/16   Time 4   Period Weeks   Status Achieved   PT SHORT TERM GOAL #2   Title Patient demonstrates proper donning & verbalizes proper cleaning of prosthesis.  (Target Date: 04/18/2016)   Baseline Met: 04/16/16   Time 4   Period Weeks   Status Achieved   PT SHORT TERM GOAL #3   Title Patient ambulates 200' with crutches & prosthesis safely with cues only for deviations not physical assist.  (Target Date: 04/18/2016)   Baseline Met: 04/16/16   Time 4   Period Weeks   Status Achieved   PT SHORT TERM GOAL #4   Title Patient negotiates ramps, curbs and stairs (1 rail) with crutches with supervision.  (Target Date: 04/18/2016)   Baseline Met: 04/16/16   Time 4   Period Weeks   Status Achieved   PT SHORT TERM GOAL #5   Title Patient able to reach 7" anteriorly, laterally, across midline and pick up object from floor with prosthesis with supervision.  (Target Date:  04/18/2016)   Baseline Pt able to reach anteriorally, laterally and across midline 7" with supervision, picking object up from floor Min A    Time 4   Period Weeks   Status Partially Met           PT Long Term Goals - 04/02/16 2213    PT LONG TERM GOAL #1   Title Patient verbalizes & demonstrates understanding of prosthetic care to enable safe use of prosthesis. (Target Date: 05/16/2016)   Time 8   Period Weeks   Status On-going   PT LONG TERM GOAL #2   Title Patient tolerates wear of prosthesis >90% of awake hours without skin issues or limb tenderness to enable function throughout his awake hours.  (Target Date: 05/16/2016)  Time 8   Period Weeks   Status On-going   PT LONG TERM GOAL #3   Title Berg Balance Test > 45/56 to indicate lower fall risk.  (Target Date: 05/16/2016)   Time 8   Period Weeks   Status On-going   PT LONG TERM GOAL #4   Title Patient ambulates 500' with LRAD & prosthesis outside including grass, ramps & curbs modified independent for community access.  (Target Date: 05/16/2016)   Time 8   Period Weeks   Status On-going   PT LONG TERM GOAL #5   Title Patient ambulates around furniture carrrying plate & cup with only prosthesis independently.  (Target Date: 05/16/2016)   Time 8   Period Weeks   Status On-going   PT LONG TERM GOAL #6   Title Patient able to transfer on/off ground and demonstrate yard work and golf swing with prosthesis modified independent to return to leisure activities.  (Target Date: 05/16/2016)   Time 8   Period Weeks   Status On-going               Plan - 05/07/16 1713    Clinical Impression Statement Pt continues to require heavy cuing when managing ramps with SPC, continues to offload RLE and decreases his time on that limb during gait and functional activites   Rehab Potential Good   PT Frequency 2x / week   PT Duration 8 weeks   PT Treatment/Interventions ADLs/Self Care Home Management;DME Instruction;Gait training;Stair  training;Functional mobility training;Therapeutic activities;Therapeutic exercise;Balance training;Neuromuscular re-education;Patient/family education;Prosthetic Training   PT Next Visit Plan Weightshifting to RLE   Consulted and Agree with Plan of Care Patient      Patient will benefit from skilled therapeutic intervention in order to improve the following deficits and impairments:  Abnormal gait, Decreased activity tolerance, Decreased balance, Decreased endurance, Decreased mobility, Postural dysfunction, Prosthetic Dependency  Visit Diagnosis: Other abnormalities of gait and mobility  Unsteadiness on feet  Other symptoms and signs involving the musculoskeletal system     Problem List Patient Active Problem List   Diagnosis Date Noted  . Type 2 diabetes with complication ([place]) 22/48/2500  . Phantom pain after amputation of lower extremity ([place]) 01/18/2016  . Neuropathic pain   . Above knee amputation of right lower extremity ([place]) 12/29/2015  . PAD (peripheral artery disease) ([place]) 12/15/2015  . Hyperlipidemia associated with type 2 diabetes mellitus ([place]) 12/07/2015  . Hypertension associated with diabetes ([place]) 12/04/2014    Dillard Essex, SPT 05/07/2016, 5:15 PM  [facility] [address] [place], [place], [address] Phone: [phone]   Fax:  [phone]  Name: Adam [name] MRN: [id number] Date of Birth: 01/03/1951

## 2016-05-12 ENCOUNTER — Ambulatory Visit: Payer: Medicare Other | Admitting: Physical Therapy

## 2016-05-12 DIAGNOSIS — R2681 Unsteadiness on feet: Secondary | ICD-10-CM

## 2016-05-12 DIAGNOSIS — R2689 Other abnormalities of gait and mobility: Secondary | ICD-10-CM

## 2016-05-12 DIAGNOSIS — Z89511 Acquired absence of right leg below knee: Secondary | ICD-10-CM | POA: Diagnosis not present

## 2016-05-12 DIAGNOSIS — E1169 Type 2 diabetes mellitus with other specified complication: Secondary | ICD-10-CM | POA: Diagnosis not present

## 2016-05-12 DIAGNOSIS — R29898 Other symptoms and signs involving the musculoskeletal system: Secondary | ICD-10-CM

## 2016-05-12 DIAGNOSIS — Z89611 Acquired absence of right leg above knee: Secondary | ICD-10-CM | POA: Diagnosis not present

## 2016-05-12 NOTE — Therapy (Signed)
[facility] [address] [place], [place], [address] Phone: [phone]   Fax:  [phone]  Physical Therapy Treatment  Patient Details  Name: Adam [name] MRN: [id number] Date of Birth: Jan 14, 1951 Referring Provider: [name], MD  Encounter Date: 05/12/2016      PT End of Session - 05/12/16 1213    Visit Number 14   Number of Visits 33   Date for PT Re-Evaluation 07/11/16   Authorization Type Medicare G-code & progress note   PT Start Time 1019   PT Stop Time 1058   PT Time Calculation (min) 39 min   Equipment Utilized During Treatment Gait belt   Activity Tolerance Patient tolerated treatment well;No increased pain   Behavior During Therapy [facility] for tasks assessed/performed      Past Medical History  Diagnosis Date  . Hypertension   . Peripheral vascular disease ([place])   . Gangrene ([place]) 11/22/2015    right great toe  . OSA on CPAP   . Type II diabetes mellitus ([place]) dx'd 11/2015  . PAD (peripheral artery disease) [facility])     Past Surgical History  Procedure Laterality Date  . Peripheral vascular catheterization N/A 11/26/2015    Procedure: Abdominal Aortogram;  Surgeon: [name], MD;  Location: [place];  Service: Cardiovascular;  Laterality: N/A;  . Vasectomy    . Colonoscopy    . Femoral-popliteal bypass graft Right 11/28/2015    Procedure: RIGHT  FEMORAL-POPLITEAL ARTERY BYPASS GRAFT AND CONSTRUCTION OF MILLER CUFF USING 6 MM X 80 CM PROPATEN  GRAFT ;  Surgeon: Conrad Westboro, MD;  Location: [facility];  Service: Vascular;  Laterality: Right;  . Amputation Right 12/01/2015    Procedure: AMPUTATION RIGHT GREAT;  Surgeon: Conrad Marne, MD;  Location: [facility];  Service: Vascular;  Laterality: Right;  . Amputation Right 12/16/2015    Procedure: AMPUTATION BELOW KNEE;  Surgeon: [name], MD;  Location: [place];  Service: Vascular;  Laterality: Right;  . Amputation Right 12/26/2015    Procedure:  RIGHT ABOVE KNEE AMPUTATION;  Surgeon: [name], MD;  Location: [facility];  Service: Vascular;  Laterality: Right;  . Inguinal hernia repair Bilateral 2000    There were no vitals filed for this visit.      Subjective Assessment - 05/12/16 1021    Subjective No issues since last visit. Cleaned garage this weekend with no issues, feels balance is good   Pertinent History DM, HTN   Limitations Lifting;Standing;Walking   Patient Stated Goals Use prosthesis to play golf, go to beach house including boating, yardwork   Currently in Pain? No/denies            [facility] PT Assessment - 05/12/16 1015    Berg Balance Test   Sit to Stand Able to stand  independently using hands   Standing Unsupported Able to stand safely 2 minutes   Sitting with Back Unsupported but Feet Supported on Floor or Stool Able to sit safely and securely 2 minutes   Stand to Sit Sits safely with minimal use of hands   Transfers Able to transfer safely, definite need of hands   Standing Unsupported with Eyes Closed Able to stand 10 seconds safely   Standing Ubsupported with Feet Together Needs help to attain position and unable to hold for 15 seconds   From Standing, Reach Forward with Outstretched Arm Can reach confidently >25 cm (10")   From Standing Position, Pick up  Object from Floor Able to pick up shoe safely and easily   From Standing Position, Turn to Look Behind Over each Shoulder Looks behind one side only/other side shows less weight shift   Turn 360 Degrees Needs assistance while turning   Standing Unsupported, Alternately Place Feet on Step/Stool Needs assistance to keep from falling or unable to try   Standing Unsupported, One Foot in [employer] balance while stepping or standing   Standing on One Leg Unable to try or needs assist to prevent fall   Total Score 33   Functional Gait  Assessment   Gait assessed  Yes   Gait Level Surface Walks 20 ft in less than 7 sec but greater than 5.5 sec, uses  assistive device, slower speed, mild gait deviations, or deviates 6-10 in outside of the 12 in walkway width.   Change in Gait Speed Makes only minor adjustments to walking speed, or accomplishes a change in speed with significant gait deviations, deviates 10-15 in outside the 12 in walkway width, or changes speed but loses balance but is able to recover and continue walking.   Gait with Horizontal Head Turns Performs head turns with moderate changes in gait velocity, slows down, deviates 10-15 in outside 12 in walkway width but recovers, can continue to walk.   Gait with Vertical Head Turns Performs task with moderate change in gait velocity, slows down, deviates 10-15 in outside 12 in walkway width but recovers, can continue to walk.   Gait and Pivot Turn Turns slowly, requires verbal cueing, or requires several small steps to catch balance following turn and stop   Step Over Obstacle Is able to step over one shoe box (4.5 in total height) but must slow down and adjust steps to clear box safely. May require verbal cueing.   Gait with Narrow Base of Support Ambulates less than 4 steps heel to toe or cannot perform without assistance.   Gait with Eyes Closed Cannot walk 20 ft without assistance, severe gait deviations or imbalance, deviates greater than 15 in outside 12 in walkway width or will not attempt task.   Ambulating Backwards Walks 20 ft, slow speed, abnormal gait pattern, evidence for imbalance, deviates 10-15 in outside 12 in walkway width.   Steps Cannot do safely.   Total Score 8                     [facility] Adult PT Treatment/Exercise - 05/12/16 1015    Transfers   Floor to Transfer Details (indicate cue type and reason) PT demo and instuction for technique and prosthetic knee control. Pt was able to return demostration with verbal cues for increased hip extension    Ambulation/Gait   Ambulation/Gait Yes   Ambulation/Gait Assistance 4: Min guard  Min gaurd with SPC outside    Ambulation/Gait Assistance Details With [facility] through grass and paved surfaces, pt catches prosthetic foot often and is unable to consently unlock prosthesic knee. Verbal cues for increasing step length and weightshift in order to load prosthetic toe to promote unlocking   Ambulation Distance (Feet) 500 Feet  x1, 80 x1, 80x1   Assistive device Straight cane;Prosthesis   Gait Pattern Decreased step length - left;Decreased stride length;Decreased hip/knee flexion - right;Decreased weight shift to right;Poor foot clearance - right;Decreased stance time - right;Narrow base of support   Ambulation Surface Level;Unlevel;Gravel;Outdoor;Grass;Paved;Indoor   Gait velocity 1.56 ft/s  limited community ambulator   Stairs Yes   Stairs Assistance 5: Supervision  Stairs Assistance Details (indicate cue type and reason) Verbal cues for sequencing of prosthetic knee    Stair Management Technique Two rails;One rail Left   Number of Stairs 4   Ramp 4: Min assist  HHA for ascending ramp for working on step through pattern/    Ramp Details (indicate cue type and reason) Verbal cues for weightshift and prosthetic knee control   Curb 5: Supervision   Curb Details (indicate cue type and reason) with SPC outside, supervision for maintaining balance   Gait Comments --   Neuro Re-ed    Neuro Re-ed Details  --   Knee/Hip Exercises: Aerobic   Tread Mill --   Prosthetics   Current prosthetic wear tolerance (days/week)  daily   Current prosthetic wear tolerance (#hours/day)  all awake hours except for 1 hour midday   Current prosthetic weight-bearing tolerance (hours/day)  --                PT Education - 05/12/16 1213    Education provided Yes   Education Details Extended PT POC   Person(s) Educated Patient   Methods Explanation   Comprehension Verbalized understanding          PT Short Term Goals - 05/12/16 1219    PT SHORT TERM GOAL #1   Title Patient tolerates wear of prosthesis >10hrs  total /day without skin issues or limb pain. (Target Date: 04/18/2016)   Baseline Met: 04/16/16   Time 4   Period Weeks   Status Achieved   PT SHORT TERM GOAL #2   Title Patient demonstrates proper donning & verbalizes proper cleaning of prosthesis.  (Target Date: 04/18/2016)   Baseline Met: 04/16/16   Time 4   Period Weeks   Status Achieved   PT SHORT TERM GOAL #3   Title Patient ambulates 200' with crutches & prosthesis safely with cues only for deviations not physical assist.  (Target Date: 04/18/2016)   Baseline Met: 04/16/16   Time 4   Period Weeks   Status Achieved   PT SHORT TERM GOAL #4   Title Patient negotiates ramps, curbs and stairs (1 rail) with crutches with supervision.  (Target Date: 04/18/2016)   Baseline Met: 04/16/16   Time 4   Period Weeks   Status Achieved   PT SHORT TERM GOAL #5   Title Patient able to reach 7" anteriorly, laterally, across midline and pick up object from floor with prosthesis with supervision.  (Target Date: 04/18/2016)   Baseline Pt able to reach anteriorally, laterally and across midline 7" with supervision, picking object up from floor Min A    Time 4   Period Weeks   Status Partially Met   Additional Short Term Goals   Additional Short Term Goals Yes   PT SHORT TERM GOAL #6   Title Pt will be able to ambulate 500' with SPC including grass and unlevel surfaces with supervision (New Target Date: 05/30/2016)   Time 4   Period Weeks   PT SHORT TERM GOAL #7   Title Pt will be able to negotiate ramp, curb and stairs with one rail with SPC with supervision (New Target Date: 05/30/2016)   Status New           PT Long Term Goals - 05/12/16 1214    PT LONG TERM GOAL #1   Title Patient verbalizes & demonstrates understanding of prosthetic care to enable safe use of prosthesis. (Target Date: 05/16/2016)   Baseline Met 05/12/16   Time 8  Period Weeks   Status Achieved   PT LONG TERM GOAL #2   Title Patient tolerates wear of prosthesis >90% of  awake hours without skin issues or limb tenderness to enable function throughout his awake hours.  (Target Date: 05/16/2016)   Baseline Met 05/12/16   Time 8   Period Weeks   Status Achieved   PT LONG TERM GOAL #3   Title Berg Balance Test > 42/56 to indicate lower fall risk.  (New Target Date: 07/11/2016)   Baseline 05/12/2016: Berg Balance Score 33/56   Time 8   Period Weeks   Status Revised   PT LONG TERM GOAL #4   Title Patient ambulates 800' with LRAD & prosthesis outside including grass, ramps & curbs modified independent for community access.   (New Target Date: 07/11/2016)   Baseline 05/12/2016 Pt was able to ambulate 500' with SPC including grass, ramp and curbs with Min Assist    Time 8   Period Weeks   Status Revised   PT LONG TERM GOAL #5   Title Patient ambulates around furniture carrrying plate & cup with only prosthesis independently.  (New Target Date: 07/11/2016)   Time 8   Period Weeks   Status Revised   Additional Long Term Goals   Additional Long Term Goals Yes   PT LONG TERM GOAL #6   Title Patient able to transfer on/off ground and demonstrate yard work and golf swing with prosthesis modified independent to return to leisure activities.  (New Target Date: 08/25//2017)   Baseline 05/12/2016: Pt was unable to independently attain position without cuing and demostration from PT   Time 8   Period Weeks   Status Revised   PT LONG TERM GOAL #7   Title Patient will increase Functional Gait Assessment 8/30 to 19/30 to indicate decreased risk for falls (New Target Date 07/11/16)               Plan - 05/12/16 1307    Clinical Impression Statement Pt has met only 2/6 LTGs established at the beginning of care. The patient still requires instruction and cuing with gait, ramp and curbs with SPC and prosthesis. This patient would continue to benefit from skilled PT to progress with safety in the commnity with a device requiring less UE support   Rehab Potential Good    PT Frequency 2x / week   PT Duration 8 weeks   PT Treatment/Interventions ADLs/Self Care Home Management;DME Instruction;Gait training;Stair training;Functional mobility training;Therapeutic activities;Therapeutic exercise;Balance training;Neuromuscular re-education;Patient/family education;Prosthetic Training   PT Next Visit Plan Weightshifting to RLE, gait with SPC   Consulted and Agree with Plan of Care Patient      Patient will benefit from skilled therapeutic intervention in order to improve the following deficits and impairments:  Abnormal gait, Decreased activity tolerance, Decreased balance, Decreased endurance, Decreased mobility, Postural dysfunction, Prosthetic Dependency  Visit Diagnosis: Other abnormalities of gait and mobility  Unsteadiness on feet  Other symptoms and signs involving the musculoskeletal system     Problem List Patient Active Problem List   Diagnosis Date Noted  . Type 2 diabetes with complication ([place]) 92/09/9416  . Phantom pain after amputation of lower extremity ([place]) 01/18/2016  . Neuropathic pain   . Above knee amputation of right lower extremity ([place]) 12/29/2015  . PAD (peripheral artery disease) ([place]) 12/15/2015  . Hyperlipidemia associated with type 2 diabetes mellitus ([place]) 12/07/2015  . Hypertension associated with diabetes ([place]) 12/04/2014   Raynie Steinhaus, SPT 05/12/2016, 1:17 PM  Jamey Reas, PT, DPT PT Specializing in [place] 05/13/2016 9:13 AM Phone:  [phone]  Fax:  [phone] [facility] [address] [place], [place] [address]   [facility] [address] [place], [place], [address] Phone: [phone]   Fax:  [phone]  Name: Adam [name] MRN: [id number] Date of Birth: 03-01-1951

## 2016-05-14 ENCOUNTER — Ambulatory Visit: Payer: Medicare Other | Admitting: Physical Therapy

## 2016-05-14 ENCOUNTER — Encounter: Payer: Self-pay | Admitting: Physical Therapy

## 2016-05-14 DIAGNOSIS — Z89511 Acquired absence of right leg below knee: Secondary | ICD-10-CM

## 2016-05-14 DIAGNOSIS — R2681 Unsteadiness on feet: Secondary | ICD-10-CM | POA: Diagnosis not present

## 2016-05-14 DIAGNOSIS — R2689 Other abnormalities of gait and mobility: Secondary | ICD-10-CM | POA: Diagnosis not present

## 2016-05-14 DIAGNOSIS — E1169 Type 2 diabetes mellitus with other specified complication: Secondary | ICD-10-CM | POA: Diagnosis not present

## 2016-05-14 DIAGNOSIS — R29898 Other symptoms and signs involving the musculoskeletal system: Secondary | ICD-10-CM

## 2016-05-14 DIAGNOSIS — Z89611 Acquired absence of right leg above knee: Secondary | ICD-10-CM | POA: Diagnosis not present

## 2016-05-14 DIAGNOSIS — S78111A Complete traumatic amputation at level between right hip and knee, initial encounter: Secondary | ICD-10-CM

## 2016-05-14 NOTE — Therapy (Signed)
[facility] [address] [place], [place], [address] Phone: [phone]   Fax:  [phone]  Physical Therapy Treatment  Patient Details  Name: Adam [name] MRN: [id number] Date of Birth: 10-03-1951 Referring Provider: [name], MD  Encounter Date: 05/14/2016      PT End of Session - 05/14/16 1119    Visit Number 15   Number of Visits 31   Date for PT Re-Evaluation 07/11/16   Authorization Type Medicare G-code & progress note   PT Start Time 1020   PT Stop Time 1100   PT Time Calculation (min) 40 min   Equipment Utilized During Treatment Gait belt   Activity Tolerance Patient tolerated treatment well;No increased pain   Behavior During Therapy [facility] for tasks assessed/performed      Past Medical History  Diagnosis Date  . Hypertension   . Peripheral vascular disease ([place])   . Gangrene ([place]) 11/22/2015    right great toe  . OSA on CPAP   . Type II diabetes mellitus ([place]) dx'd 11/2015  . PAD (peripheral artery disease) [facility])     Past Surgical History  Procedure Laterality Date  . Peripheral vascular catheterization N/A 11/26/2015    Procedure: Abdominal Aortogram;  Surgeon: [name], MD;  Location: [place] [facility];  Service: Cardiovascular;  Laterality: N/A;  . Vasectomy    . Colonoscopy    . Femoral-popliteal bypass graft Right 11/28/2015    Procedure: RIGHT  FEMORAL-POPLITEAL ARTERY BYPASS GRAFT AND CONSTRUCTION OF MILLER CUFF USING 6 MM X 80 CM PROPATEN  GRAFT ;  Surgeon: Conrad Immokalee, MD;  Location: [place];  Service: Vascular;  Laterality: Right;  . Amputation Right 12/01/2015    Procedure: AMPUTATION RIGHT GREAT;  Surgeon: Conrad Whiting, MD;  Location: [place];  Service: Vascular;  Laterality: Right;  . Amputation Right 12/16/2015    Procedure: AMPUTATION BELOW KNEE;  Surgeon: [name], MD;  Location: [place];  Service: Vascular;  Laterality: Right;  . Amputation Right 12/26/2015    Procedure:  RIGHT ABOVE KNEE AMPUTATION;  Surgeon: [name], MD;  Location: [place];  Service: Vascular;  Laterality: Right;  . Inguinal hernia repair Bilateral 2000    There were no vitals filed for this visit.      Subjective Assessment - 05/14/16 1023    Subjective Bought a cane and is practising in the house.   Pertinent History DM, HTN   Limitations Lifting;Standing;Walking   Patient Stated Goals Use prosthesis to play golf, go to [employer] including boating, yardwork                         [facility] Adult PT Treatment/Exercise - 05/14/16 0001    Transfers   Floor to Transfer 5: Supervision   Floor to Transfer Details (indicate cue type and reason) PT demo & instructed technique with TFA prosthesis. Pt return demo understanding.    Ambulation/Gait   Ambulation/Gait Yes   Ambulation/Gait Assistance 4: Min assist   Ambulation/Gait Assistance Details Used space between blue and white tiles to work on correct spacing between feet esp. with turns   Ambulation Distance (Feet) 500 Feet  +500' outdoors   Assistive device Prosthesis;Straight cane   Gait Pattern Decreased step length - left;Decreased stride length;Decreased hip/knee flexion - right;Decreased weight shift to right;Poor foot clearance - right;Decreased stance time - right;Narrow base of support;Abducted- right   Ambulation Surface Level;Unlevel;Indoor;Outdoor;Paved;Gravel;Grass  Ramp 4: Min assist   Ramp Details (indicate cue type and reason) verbal & tactile cues on technique and weight shift   Curb 4: Min assist   Prosthetics   Current prosthetic wear tolerance (days/week)  daily   Current prosthetic wear tolerance (#hours/day)  all awake hours except for 1 hour midday             Balance Exercises - 05/14/16 1041    Balance Exercises: Standing   Turning Right;Left;10 reps;Other (comment)  with cane and Rt prosthesis; greater difficulty with balance and unlocking prosthesis when turning left.            PT Education - 05/14/16 1116    Education provided Yes   Education Details  gait mechanics technique with turning and maintaining swing phase with right prosthesis   Person(s) Educated Patient   Methods Explanation;Demonstration;Verbal cues   Comprehension Verbalized understanding;Returned demonstration;Verbal cues required;Tactile cues required;Need further instruction          PT Short Term Goals - 05/12/16 1219    PT SHORT TERM GOAL #1   Title Patient tolerates wear of prosthesis >10hrs total /day without skin issues or limb pain. (Target Date: 04/18/2016)   Baseline Met: 04/16/16   Time 4   Period Weeks   Status Achieved   PT SHORT TERM GOAL #2   Title Patient demonstrates proper donning & verbalizes proper cleaning of prosthesis.  (Target Date: 04/18/2016)   Baseline Met: 04/16/16   Time 4   Period Weeks   Status Achieved   PT SHORT TERM GOAL #3   Title Patient ambulates 200' with crutches & prosthesis safely with cues only for deviations not physical assist.  (Target Date: 04/18/2016)   Baseline Met: 04/16/16   Time 4   Period Weeks   Status Achieved   PT SHORT TERM GOAL #4   Title Patient negotiates ramps, curbs and stairs (1 rail) with crutches with supervision.  (Target Date: 04/18/2016)   Baseline Met: 04/16/16   Time 4   Period Weeks   Status Achieved   PT SHORT TERM GOAL #5   Title Patient able to reach 7" anteriorly, laterally, across midline and pick up object from floor with prosthesis with supervision.  (Target Date: 04/18/2016)   Baseline Pt able to reach anteriorally, laterally and across midline 7" with supervision, picking object up from floor Min A    Time 4   Period Weeks   Status Partially Met   Additional Short Term Goals   Additional Short Term Goals Yes   PT SHORT TERM GOAL #6   Title Pt will be able to ambulate 500' with SPC including grass and unlevel surfaces with supervision (New Target Date: 06/11/2016)   Time 1   Period Months    Status New   PT SHORT TERM GOAL #7   Title Pt will be able to negotiate ramp, curb and stairs with one rail with SPC with supervision (New Target Date: 06/11/2016)   Time 1   Period Months   Status New           PT Long Term Goals - 05/12/16 1214    PT LONG TERM GOAL #1   Title Patient verbalizes & demonstrates understanding of prosthetic care to enable safe use of prosthesis. (Target Date: 05/16/2016)   Baseline Met 05/12/16   Time 8   Period Weeks   Status Achieved   PT LONG TERM GOAL #2   Title Patient tolerates wear of prosthesis >90%  of awake hours without skin issues or limb tenderness to enable function throughout his awake hours.  (Target Date: 05/16/2016)   Baseline Met 05/12/16   Time 8   Period Weeks   Status Achieved   PT LONG TERM GOAL #3   Title Berg Balance Test > 42/56 to indicate lower fall risk.  (New Target Date: 07/11/2016)   Baseline 05/12/2016: Berg Balance Score 29/56   Time 2   Period Months   Status Revised   PT LONG TERM GOAL #4   Title Patient ambulates 800' with LRAD & prosthesis outside including grass, ramps & curbs modified independent for community access.   (New Target Date: 07/11/2016)   Baseline 05/12/2016 Pt was able to ambulate 500' with SPC including grass, ramp and curbs with Min Assist    Time 2   Period Months   Status Revised   PT LONG TERM GOAL #5   Title Patient ambulates around furniture carrrying plate & cup with only prosthesis independently.  (New Target Date: 07/11/2016)   Time 2   Period Months   Status Revised   Additional Long Term Goals   Additional Long Term Goals Yes   PT LONG TERM GOAL #6   Title Patient able to transfer on/off ground and demonstrate yard work and golf swing with prosthesis modified independent to return to leisure activities.  (New Target Date: 08/25//2017)   Baseline 05/12/2016: Pt was unable to independently attain position without cuing and demostration from PT   Time 2   Period Months   Status  Revised   PT LONG TERM GOAL #7   Title Patient will increase Functional Gait Assessment 7/30 to >/= 19/30 to indicate decreased risk for falls (New Target Date 07/11/16)   Time 2   Period Months   Status New               Plan - 05/14/16 1119    Clinical Impression Statement Working on gait mechanics towards greater consistency with appropriate wt shifts onto prosthesis and Rt knee flexion during swing phase. One LOB during left turn requiring Min A to correct.   Rehab Potential Good   PT Frequency 2x / week   PT Duration Other (comment)  9 weeks (60 days)   PT Treatment/Interventions ADLs/Self Care Home Management;DME Instruction;Gait training;Stair training;Functional mobility training;Therapeutic activities;Therapeutic exercise;Balance training;Neuromuscular re-education;Patient/family education;Prosthetic Training   PT Next Visit Plan Weightshifting to RLE, gait with SPC   Consulted and Agree with Plan of Care Patient      Patient will benefit from skilled therapeutic intervention in order to improve the following deficits and impairments:  Abnormal gait, Decreased activity tolerance, Decreased balance, Decreased endurance, Decreased mobility, Postural dysfunction, Prosthetic Dependency  Visit Diagnosis: Other abnormalities of gait and mobility  Unsteadiness on feet  Other symptoms and signs involving the musculoskeletal system  Above knee amputation of right lower extremity ([facility])  Status post below knee amputation of right lower extremity [facility])     Problem List Patient Active Problem List   Diagnosis Date Noted  . Type 2 diabetes with complication ([place]) 00/76/2263  . Phantom pain after amputation of lower extremity ([place]) 01/18/2016  . Neuropathic pain   . Above knee amputation of right lower extremity ([place]) 12/29/2015  . PAD (peripheral artery disease) ([place]) 12/15/2015  . Hyperlipidemia associated with type 2 diabetes mellitus ([place]) 12/07/2015  . Hypertension  associated with diabetes ([place]) 12/04/2014    Bjorn Loser, PTA  05/14/2016, 11:26 AM [place], [place], [address] Phone: [phone]   Fax:  [phone]  Name: Adam [name] MRN: [id number] Date of Birth: 1951-03-07

## 2016-05-19 ENCOUNTER — Ambulatory Visit: Payer: Medicare Other | Attending: Vascular Surgery | Admitting: Physical Therapy

## 2016-05-19 ENCOUNTER — Encounter: Payer: Self-pay | Admitting: Physical Therapy

## 2016-05-19 DIAGNOSIS — R29898 Other symptoms and signs involving the musculoskeletal system: Secondary | ICD-10-CM | POA: Diagnosis not present

## 2016-05-19 DIAGNOSIS — R2681 Unsteadiness on feet: Secondary | ICD-10-CM | POA: Diagnosis not present

## 2016-05-19 DIAGNOSIS — Z89511 Acquired absence of right leg below knee: Secondary | ICD-10-CM | POA: Insufficient documentation

## 2016-05-19 DIAGNOSIS — R2689 Other abnormalities of gait and mobility: Secondary | ICD-10-CM | POA: Diagnosis not present

## 2016-05-19 NOTE — Therapy (Signed)
[facility] [address] [place], [place], [address] Phone: [phone]   Fax:  [phone]  Physical Therapy Treatment  Patient Details  Name: Adam [name] MRN: [id number] Date of Birth: November 20, 1950 Referring Provider: [name], MD  Encounter Date: 05/19/2016      PT End of Session - 05/19/16 1643    Visit Number 16   Number of Visits 31   Date for PT Re-Evaluation 07/11/16   Authorization Type Medicare G-code & progress note   PT Start Time 1232   PT Stop Time 3704   PT Time Calculation (min) 45 min   Equipment Utilized During Treatment Gait belt   Activity Tolerance Patient tolerated treatment well;No increased pain   Behavior During Therapy [facility] for tasks assessed/performed      Past Medical History  Diagnosis Date  . Hypertension   . Peripheral vascular disease ([place])   . Gangrene ([place]) 11/22/2015    right great toe  . OSA on CPAP   . Type II diabetes mellitus ([place]) dx'd 11/2015  . PAD (peripheral artery disease) [facility])     Past Surgical History  Procedure Laterality Date  . Peripheral vascular catheterization N/A 11/26/2015    Procedure: Abdominal Aortogram;  Surgeon: [name], MD;  Location: [facility];  Service: Cardiovascular;  Laterality: N/A;  . Vasectomy    . Colonoscopy    . Femoral-popliteal bypass graft Right 11/28/2015    Procedure: RIGHT  FEMORAL-POPLITEAL ARTERY BYPASS GRAFT AND CONSTRUCTION OF MILLER CUFF USING 6 MM X 80 CM PROPATEN  GRAFT ;  Surgeon: Conrad Rossmore, MD;  Location: [facility];  Service: Vascular;  Laterality: Right;  . Amputation Right 12/01/2015    Procedure: AMPUTATION RIGHT GREAT;  Surgeon: Conrad Hunter, MD;  Location: [place];  Service: Vascular;  Laterality: Right;  . Amputation Right 12/16/2015    Procedure: AMPUTATION BELOW KNEE;  Surgeon: [name], MD;  Location: [place];  Service: Vascular;  Laterality: Right;  . Amputation Right 12/26/2015    Procedure:  RIGHT ABOVE KNEE AMPUTATION;  Surgeon: [name], MD;  Location: [place];  Service: Vascular;  Laterality: Right;  . Inguinal hernia repair Bilateral 2000    There were no vitals filed for this visit.      Subjective Assessment - 05/19/16 1235    Subjective Using SPC about 75% of the time in the house.   Pertinent History DM, HTN   Limitations Lifting;Standing;Walking   Patient Stated Goals Use prosthesis to play golf, go to beach house including boating, yardwork   Currently in Pain? No/denies                         [facility] Adult PT Treatment/Exercise - 05/19/16 0001    Ambulation/Gait   Ambulation/Gait Yes   Ambulation/Gait Assistance 4: Min assist   Ambulation/Gait Assistance Details Working in increasing consistency with Rt LE knee flexion during swing phase esp. with left turns.   Ambulation Distance (Feet) 345 Feet  LOBx1 due to Left foot drag at 332f, pt reporting tightness   Assistive device Prosthesis;Straight cane   Gait Pattern Decreased step length - left;Decreased stride length;Decreased hip/knee flexion - right;Decreased weight shift to right;Poor foot clearance - right;Decreased stance time - right;Narrow base of support;Abducted- right   Ambulation Surface Level;Unlevel   Stairs Yes   Stairs Assistance 5: Supervision   Stair Management Technique One rail Right;Forwards   Number  of Stairs 8             Balance Exercises - 05/19/16 1250    Balance Exercises: Standing   Standing Eyes Opened Narrow base of support (BOS)  Multilevel reaching; supervision   Stepping Strategy Anterior;Posterior;Lateral;5 reps  inttermittent UE support, cues for weight shifting and stepping. Supervision level.           PT Education - 05/19/16 1641    Education provided Yes   Education Details stepping strategies with weight shifting without UE support and mirror for biofeedback for posture and body mechanics.   Person(s) Educated Patient   Methods  Explanation;Demonstration;Verbal cues;Tactile cues;Handout   Comprehension Verbalized understanding;Returned demonstration;Verbal cues required;Tactile cues required;Need further instruction          PT Short Term Goals - 05/12/16 1219    PT SHORT TERM GOAL #1   Title Patient tolerates wear of prosthesis >10hrs total /day without skin issues or limb pain. (Target Date: 04/18/2016)   Baseline Met: 04/16/16   Time 4   Period Weeks   Status Achieved   PT SHORT TERM GOAL #2   Title Patient demonstrates proper donning & verbalizes proper cleaning of prosthesis.  (Target Date: 04/18/2016)   Baseline Met: 04/16/16   Time 4   Period Weeks   Status Achieved   PT SHORT TERM GOAL #3   Title Patient ambulates 200' with crutches & prosthesis safely with cues only for deviations not physical assist.  (Target Date: 04/18/2016)   Baseline Met: 04/16/16   Time 4   Period Weeks   Status Achieved   PT SHORT TERM GOAL #4   Title Patient negotiates ramps, curbs and stairs (1 rail) with crutches with supervision.  (Target Date: 04/18/2016)   Baseline Met: 04/16/16   Time 4   Period Weeks   Status Achieved   PT SHORT TERM GOAL #5   Title Patient able to reach 7" anteriorly, laterally, across midline and pick up object from floor with prosthesis with supervision.  (Target Date: 04/18/2016)   Baseline Pt able to reach anteriorally, laterally and across midline 7" with supervision, picking object up from floor Min A    Time 4   Period Weeks   Status Partially Met   Additional Short Term Goals   Additional Short Term Goals Yes   PT SHORT TERM GOAL #6   Title Pt will be able to ambulate 500' with SPC including grass and unlevel surfaces with supervision (New Target Date: 06/11/2016)   Time 1   Period Months   Status New   PT SHORT TERM GOAL #7   Title Pt will be able to negotiate ramp, curb and stairs with one rail with SPC with supervision (New Target Date: 06/11/2016)   Time 1   Period Months   Status  New           PT Long Term Goals - 05/12/16 1214    PT LONG TERM GOAL #1   Title Patient verbalizes & demonstrates understanding of prosthetic care to enable safe use of prosthesis. (Target Date: 05/16/2016)   Baseline Met 05/12/16   Time 8   Period Weeks   Status Achieved   PT LONG TERM GOAL #2   Title Patient tolerates wear of prosthesis >90% of awake hours without skin issues or limb tenderness to enable function throughout his awake hours.  (Target Date: 05/16/2016)   Baseline Met 05/12/16   Time 8   Period Weeks   Status Achieved  PT LONG TERM GOAL #3   Title Berg Balance Test > 42/56 to indicate lower fall risk.  (New Target Date: 07/11/2016)   Baseline 05/12/2016: Berg Balance Score 29/56   Time 2   Period Months   Status Revised   PT LONG TERM GOAL #4   Title Patient ambulates 800' with LRAD & prosthesis outside including grass, ramps & curbs modified independent for community access.   (New Target Date: 07/11/2016)   Baseline 05/12/2016 Pt was able to ambulate 500' with SPC including grass, ramp and curbs with Min Assist    Time 2   Period Months   Status Revised   PT LONG TERM GOAL #5   Title Patient ambulates around furniture carrrying plate & cup with only prosthesis independently.  (New Target Date: 07/11/2016)   Time 2   Period Months   Status Revised   Additional Long Term Goals   Additional Long Term Goals Yes   PT LONG TERM GOAL #6   Title Patient able to transfer on/off ground and demonstrate yard work and golf swing with prosthesis modified independent to return to leisure activities.  (New Target Date: 08/25//2017)   Baseline 05/12/2016: Pt was unable to independently attain position without cuing and demostration from PT   Time 2   Period Months   Status Revised   PT LONG TERM GOAL #7   Title Patient will increase Functional Gait Assessment 7/30 to >/= 19/30 to indicate decreased risk for falls (New Target Date 07/11/16)   Time 2   Period Months    Status New               Plan - 05/19/16 1644    Clinical Impression Statement Progressed balance training working on stepping strategies, required supervision and intermittent UE support.   Rehab Potential Good   PT Frequency 2x / week   PT Duration Other (comment)  9 weeks (60 days)   PT Treatment/Interventions ADLs/Self Care Home Management;DME Instruction;Gait training;Stair training;Functional mobility training;Therapeutic activities;Therapeutic exercise;Balance training;Neuromuscular re-education;Patient/family education;Prosthetic Training   PT Next Visit Plan Weightshifting to RLE, gait with SPC   Consulted and Agree with Plan of Care Patient      Patient will benefit from skilled therapeutic intervention in order to improve the following deficits and impairments:  Abnormal gait, Decreased activity tolerance, Decreased balance, Decreased endurance, Decreased mobility, Postural dysfunction, Prosthetic Dependency  Visit Diagnosis: Other abnormalities of gait and mobility  Unsteadiness on feet  Other symptoms and signs involving the musculoskeletal system  Status post below knee amputation of right lower extremity [facility])     Problem List Patient Active Problem List   Diagnosis Date Noted  . Type 2 diabetes with complication ([place]) 16/08/9603  . Phantom pain after amputation of lower extremity ([place]) 01/18/2016  . Neuropathic pain   . Above knee amputation of right lower extremity ([place]) 12/29/2015  . PAD (peripheral artery disease) ([place]) 12/15/2015  . Hyperlipidemia associated with type 2 diabetes mellitus ([place]) 12/07/2015  . Hypertension associated with diabetes ([place]) 12/04/2014    Bjorn Loser, PTA  05/19/2016, 4:47 PM [facility] [address] [place], [place], [address] Phone: [phone]   Fax:  [phone]  Name: Adam [name] MRN: [id number] Date of Birth: 06/30/1951

## 2016-05-21 ENCOUNTER — Encounter: Payer: Self-pay | Admitting: Physical Therapy

## 2016-05-21 ENCOUNTER — Ambulatory Visit: Payer: Medicare Other | Admitting: Physical Therapy

## 2016-05-21 DIAGNOSIS — R29898 Other symptoms and signs involving the musculoskeletal system: Secondary | ICD-10-CM | POA: Diagnosis not present

## 2016-05-21 DIAGNOSIS — R2681 Unsteadiness on feet: Secondary | ICD-10-CM | POA: Diagnosis not present

## 2016-05-21 DIAGNOSIS — R2689 Other abnormalities of gait and mobility: Secondary | ICD-10-CM | POA: Diagnosis not present

## 2016-05-21 DIAGNOSIS — Z89511 Acquired absence of right leg below knee: Secondary | ICD-10-CM | POA: Diagnosis not present

## 2016-05-22 NOTE — Therapy (Signed)
[facility] [address] [place], [place], [address] Phone: [phone]   Fax:  [phone]  Physical Therapy Treatment  Patient Details  Name: Adam [name] MRN: [id number] Date of Birth: [birth year] Referring Provider: [name], MD  Encounter Date: 05/21/2016      PT End of Session - 05/21/16 1230    Visit Number 17   Number of Visits 31   Date for PT Re-Evaluation 07/11/16   Authorization Type Medicare G-code & progress note   PT Start Time 1146   PT Stop Time 1230   PT Time Calculation (min) 44 min   Equipment Utilized During Treatment Gait belt   Activity Tolerance Patient tolerated treatment well;No increased pain   Behavior During Therapy [facility] for tasks assessed/performed      Past Medical History  Diagnosis Date  . Hypertension   . Peripheral vascular disease ([place])   . Gangrene ([place]) 11/22/2015    right great toe  . OSA on CPAP   . Type II diabetes mellitus ([place]) dx'd 11/2015  . PAD (peripheral artery disease) [facility])     Past Surgical History  Procedure Laterality Date  . Peripheral vascular catheterization N/A 11/26/2015    Procedure: Abdominal Aortogram;  Surgeon: [name], MD;  Location: [facility];  Service: Cardiovascular;  Laterality: N/A;  . Vasectomy    . Colonoscopy    . Femoral-popliteal bypass graft Right 11/28/2015    Procedure: RIGHT  FEMORAL-POPLITEAL ARTERY BYPASS GRAFT AND CONSTRUCTION OF MILLER CUFF USING 6 MM X 80 CM PROPATEN  GRAFT ;  Surgeon: Conrad Milford, MD;  Location: [facility];  Service: Vascular;  Laterality: Right;  . Amputation Right 12/01/2015    Procedure: AMPUTATION RIGHT GREAT;  Surgeon: Conrad Eureka, MD;  Location: [facility];  Service: Vascular;  Laterality: Right;  . Amputation Right 12/16/2015    Procedure: AMPUTATION BELOW KNEE;  Surgeon: [name], MD;  Location: [place];  Service: Vascular;  Laterality: Right;  . Amputation Right 12/26/2015    Procedure:  RIGHT ABOVE KNEE AMPUTATION;  Surgeon: [name], MD;  Location: [place];  Service: Vascular;  Laterality: Right;  . Inguinal hernia repair Bilateral 2000    There were no vitals filed for this visit.      Subjective Assessment - 05/21/16 1150    Subjective (p) He saw prosthetist just prior to PT and he added pads. It seems to help control the rotation.    Pertinent History (p) DM, HTN   Limitations (p) Lifting;Standing;Walking   Patient Stated Goals (p) Use prosthesis to play golf, go to beach house including boating, yardwork   Currently in Pain? (p) No/denies                         [facility] Adult PT Treatment/Exercise - 05/21/16 1145    Ambulation/Gait   Ambulation/Gait Yes   Ambulation/Gait Assistance 4: Min assist;5: Supervision;4: Min guard  cane w/ Min Guard w/advanced activities, MinA no device   Ambulation/Gait Assistance Details with cane: visual, verbal & tactile cues to work on step width; In parallel bars worked on gait without device with cues on weight shift laterally & anteriorly over stance limb/ prosthesis   Ambulation Distance (Feet) 350 Feet  350' w/ cane; 10' parallel bars >10 laps working on no devic   Assistive device Prosthesis;Straight cane;None   Gait Pattern Decreased step length - left;Decreased stride length;Decreased hip/knee flexion -  right;Decreased weight shift to right;Poor foot clearance - right;Decreased stance time - right;Narrow base of support;Abducted- right   Ambulation Surface Indoor;Level   Ramp 5: Supervision  cane & prosthesis   Ramp Details (indicate cue type and reason) cues on technique to improve safety   Curb 5: Supervision  cane & prosthesis,    Curb Details (indicate cue type and reason) cues on technique to improve safety   High Level Balance   High Level Balance Activities Side stepping;Backward walking;Tandem walking  in parallel bars with intermittent UE support   Prosthetics   Prosthetic Care Comments   donning with proper pelvis & prosthesis alignment/rotation when seating limb into socket and too many or too few ply socks can cause rotation.    Current prosthetic wear tolerance (days/week)  daily   Current prosthetic wear tolerance (#hours/day)  all awake hours   Residual limb condition  reports no issues   Education Provided Proper Donning;Correct ply sock adjustment  see prosthetic care comments   Person(s) Educated Patient   Education Method Explanation;Verbal cues   Education Method Verbalized understanding;Verbal cues required;Needs further instruction             Balance Exercises - 05/21/16 1145    Balance Exercises: Standing   SLS with Vectors Solid surface;Upper extremity assist 2;Upper extremity assist 1;5 reps  visual & verbal cues on wt shift over prosthesis   Other Standing Exercises rolling tennis ball under left foot to facilitate improved balance on RLE / prosthesis.            PT Education - 05/21/16 1230    Education provided Yes   Education Details rolling tennis ball with LLE / stance on RLE   Person(s) Educated Patient   Methods Explanation;Verbal cues;Demonstration;Tactile cues   Comprehension Verbalized understanding;Returned demonstration;Verbal cues required;Tactile cues required;Need further instruction          PT Short Term Goals - 05/21/16 1230    PT SHORT TERM GOAL #1   Title Patient tolerates wear of prosthesis >10hrs total /day without skin issues or limb pain. (Target Date: 04/18/2016)   Baseline Met: 04/16/16   Time 4   Period Weeks   Status Achieved   PT SHORT TERM GOAL #2   Title Patient demonstrates proper donning & verbalizes proper cleaning of prosthesis.  (Target Date: 04/18/2016)   Baseline Met: 04/16/16   Time 4   Period Weeks   Status Achieved   PT SHORT TERM GOAL #3   Title Patient ambulates 200' with crutches & prosthesis safely with cues only for deviations not physical assist.  (Target Date: 04/18/2016)   Baseline Met:  04/16/16   Time 4   Period Weeks   Status Achieved   PT SHORT TERM GOAL #4   Title Patient negotiates ramps, curbs and stairs (1 rail) with crutches with supervision.  (Target Date: 04/18/2016)   Baseline Met: 04/16/16   Time 4   Period Weeks   Status Achieved   PT SHORT TERM GOAL #5   Title Patient able to reach 7" anteriorly, laterally, across midline and pick up object from floor with prosthesis with supervision.  (Target Date: 04/18/2016)   Baseline Pt able to reach anteriorally, laterally and across midline 7" with supervision, picking object up from floor Min A    Time 4   Period Weeks   Status Partially Met   PT SHORT TERM GOAL #6   Title Pt will be able to ambulate 500' with SPC including grass and  unlevel surfaces with supervision (New Target Date: 06/11/2016)   Time 1   Period Months   Status On-going   PT SHORT TERM GOAL #7   Title Pt will be able to negotiate ramp, curb and stairs with one rail with SPC with supervision (New Target Date: 06/11/2016)   Time 1   Period Months   Status On-going           PT Long Term Goals - 05/21/16 1230    PT LONG TERM GOAL #1   Title Patient verbalizes & demonstrates understanding of prosthetic care to enable safe use of prosthesis. (Target Date: 05/16/2016)   Baseline Met 05/12/16   Time 8   Period Weeks   Status Achieved   PT LONG TERM GOAL #2   Title Patient tolerates wear of prosthesis >90% of awake hours without skin issues or limb tenderness to enable function throughout his awake hours.  (Target Date: 05/16/2016)   Baseline Met 05/12/16   Time 8   Period Weeks   Status Achieved   PT LONG TERM GOAL #3   Title Berg Balance Test > 42/56 to indicate lower fall risk.  (New Target Date: 07/11/2016)   Baseline 05/12/2016: Berg Balance Score 29/56   Time 2   Period Months   Status On-going   PT LONG TERM GOAL #4   Title Patient ambulates 800' with LRAD & prosthesis outside including grass, ramps & curbs modified independent  for community access.   (New Target Date: 07/11/2016)   Baseline 05/12/2016 Pt was able to ambulate 500' with SPC including grass, ramp and curbs with Min Assist    Time 2   Period Months   Status On-going   PT LONG TERM GOAL #5   Title Patient ambulates around furniture carrrying plate & cup with only prosthesis independently.  (New Target Date: 07/11/2016)   Time 2   Period Months   Status On-going   PT LONG TERM GOAL #6   Title Patient able to transfer on/off ground and demonstrate yard work and golf swing with prosthesis modified independent to return to leisure activities.  (New Target Date: 08/25//2017)   Baseline 05/12/2016: Pt was unable to independently attain position without cuing and demostration from PT   Time 2   Period Months   Status On-going   PT LONG TERM GOAL #7   Title Patient will increase Functional Gait Assessment 7/30 to >/= 19/30 to indicate decreased risk for falls (New Target Date 07/11/16)   Time 2   Period Months   Status On-going               Plan - 05/21/16 1230    Clinical Impression Statement Patient improved balance with skilled care in parallel bars with intermittent UE support. Patient appears safe to use cane for limited community activities.    Rehab Potential Good   PT Frequency 2x / week   PT Duration Other (comment)  9 weeks (60 days)   PT Treatment/Interventions ADLs/Self Care Home Management;DME Instruction;Gait training;Stair training;Functional mobility training;Therapeutic activities;Therapeutic exercise;Balance training;Neuromuscular re-education;Patient/family education;Prosthetic Training   PT Next Visit Plan gait with cane outside as able & balance / gait without device inside parallel bars.   Consulted and Agree with Plan of Care Patient      Patient will benefit from skilled therapeutic intervention in order to improve the following deficits and impairments:  Abnormal gait, Decreased activity tolerance, Decreased balance,  Decreased endurance, Decreased mobility, Postural dysfunction, Prosthetic Dependency  Visit Diagnosis:  Other abnormalities of gait and mobility  Unsteadiness on feet  Other symptoms and signs involving the musculoskeletal system     Problem List Patient Active Problem List   Diagnosis Date Noted  . Type 2 diabetes with complication ([place]) 71/16/5790  . Phantom pain after amputation of lower extremity ([place]) 01/18/2016  . Neuropathic pain   . Above knee amputation of right lower extremity ([place]) 12/29/2015  . PAD (peripheral artery disease) ([place]) 12/15/2015  . Hyperlipidemia associated with type 2 diabetes mellitus ([place]) 12/07/2015  . Hypertension associated with diabetes ([place]) 12/04/2014    [place] PT, DPT 05/22/2016, 9:15 AM  [facility] [address] [place], [place], [address] Phone: [phone]   Fax:  [phone]  Name: NICKI GRACY MRN: [id number] Date of Birth: 09-14-1951

## 2016-05-26 ENCOUNTER — Encounter: Payer: Self-pay | Admitting: Physical Therapy

## 2016-05-26 ENCOUNTER — Ambulatory Visit: Payer: Medicare Other | Admitting: Physical Therapy

## 2016-05-26 DIAGNOSIS — R2689 Other abnormalities of gait and mobility: Secondary | ICD-10-CM | POA: Diagnosis not present

## 2016-05-26 DIAGNOSIS — R2681 Unsteadiness on feet: Secondary | ICD-10-CM

## 2016-05-26 DIAGNOSIS — R29898 Other symptoms and signs involving the musculoskeletal system: Secondary | ICD-10-CM | POA: Diagnosis not present

## 2016-05-26 DIAGNOSIS — Z89511 Acquired absence of right leg below knee: Secondary | ICD-10-CM | POA: Diagnosis not present

## 2016-05-26 NOTE — Therapy (Signed)
[facility] [address] [place], [place], [address] Phone: [phone]   Fax:  [phone]  Physical Therapy Treatment  Patient Details  Name: Adam [name] MRN: [id number] Date of Birth: Apr 12, 1951 Referring Provider: [name], MD  Encounter Date: 05/26/2016      PT End of Session - 05/26/16 2122    Visit Number 18   Number of Visits 31   Date for PT Re-Evaluation 07/11/16   Authorization Type Medicare G-code & progress note   PT Start Time 1020   PT Stop Time 1110   PT Time Calculation (min) 50 min   Equipment Utilized During Treatment Gait belt   Activity Tolerance Patient tolerated treatment well;No increased pain   Behavior During Therapy [facility] for tasks assessed/performed      Past Medical History  Diagnosis Date  . Hypertension   . Peripheral vascular disease ([place])   . Gangrene ([place]) 11/22/2015    right great toe  . OSA on CPAP   . Type II diabetes mellitus ([place]) dx'd 11/2015  . PAD (peripheral artery disease) [facility])     Past Surgical History  Procedure Laterality Date  . Peripheral vascular catheterization N/A 11/26/2015    Procedure: Abdominal Aortogram;  Surgeon: [name], MD;  Location: [facility];  Service: Cardiovascular;  Laterality: N/A;  . Vasectomy    . Colonoscopy    . Femoral-popliteal bypass graft Right 11/28/2015    Procedure: RIGHT  FEMORAL-POPLITEAL ARTERY BYPASS GRAFT AND CONSTRUCTION OF MILLER CUFF USING 6 MM X 80 CM PROPATEN  GRAFT ;  Surgeon: Conrad Brock [place], MD;  Location: [facility];  Service: Vascular;  Laterality: Right;  . Amputation Right 12/01/2015    Procedure: AMPUTATION RIGHT GREAT;  Surgeon: Conrad [place], MD;  Location: [facility];  Service: Vascular;  Laterality: Right;  . Amputation Right 12/16/2015    Procedure: AMPUTATION BELOW KNEE;  Surgeon: [name], MD;  Location: [facility];  Service: Vascular;  Laterality: Right;  . Amputation Right 12/26/2015    Procedure:  RIGHT ABOVE KNEE AMPUTATION;  Surgeon: [name], MD;  Location: [facility];  Service: Vascular;  Laterality: Right;  . Inguinal hernia repair Bilateral 2000    There were no vitals filed for this visit.      Subjective Assessment - 05/26/16 1026    Subjective (p) He has been at beach with no issues. He has not walked on sand.    Pertinent History (p) DM, HTN   Limitations (p) Lifting;Standing;Walking   Patient Stated Goals (p) Use prosthesis to play golf, go to beach house including boating, yardwork   Currently in Pain? (p) No/denies                         [facility] Adult PT Treatment/Exercise - 05/26/16 1015    Transfers   Transfers Sit to Stand;Stand to Sit   Sit to Stand 5: Supervision;With upper extremity assist;From chair/3-in-1   Sit to Stand Details (indicate cue type and reason) PT demo, instructed in proper positioning to engage prosthetic knee for weight bearing upon standing    Stand to Sit 5: Supervision;With upper extremity assist;To chair/3-in-1   Ambulation/Gait   Ambulation/Gait Yes   Ambulation/Gait Assistance 5: Supervision   Ambulation/Gait Assistance Details cues on weight shifting over prosthesis in stance, fluency of gait & posture. No device inside parallel bars with intermittent UE support with visual, verbal & tactile cues  Ambulation Distance (Feet) 350 Feet  350' w/ cane; 10' parallel bars 5 laps working on no devic   Assistive device Prosthesis;Straight cane;None   Gait Pattern Decreased step length - left;Decreased stride length;Decreased hip/knee flexion - right;Decreased weight shift to right;Poor foot clearance - right;Decreased stance time - right;Narrow base of support;Abducted- right   Ambulation Surface Indoor;Level   Ramp 5: Supervision  cane & prosthesis   Curb 5: Supervision  cane & prosthesis,    Pre-Gait Activities Gait Trainer program on Treadmill for visual feedback 1.89mh for 6 min.   High Level Balance   High Level  Balance Activities Side stepping;Backward walking  with cane    High Level Balance Comments Turning 360* clockwise & counter-clockwise with cane with cues on weighting prosthesis in stance   Prosthetics   Current prosthetic wear tolerance (days/week)  daily   Current prosthetic wear tolerance (#hours/day)  all awake hours   Residual limb condition  reports no issues                  PT Short Term Goals - 05/21/16 1230    PT SHORT TERM GOAL #1   Title Patient tolerates wear of prosthesis >10hrs total /day without skin issues or limb pain. (Target Date: 04/18/2016)   Baseline Met: 04/16/16   Time 4   Period Weeks   Status Achieved   PT SHORT TERM GOAL #2   Title Patient demonstrates proper donning & verbalizes proper cleaning of prosthesis.  (Target Date: 04/18/2016)   Baseline Met: 04/16/16   Time 4   Period Weeks   Status Achieved   PT SHORT TERM GOAL #3   Title Patient ambulates 200' with crutches & prosthesis safely with cues only for deviations not physical assist.  (Target Date: 04/18/2016)   Baseline Met: 04/16/16   Time 4   Period Weeks   Status Achieved   PT SHORT TERM GOAL #4   Title Patient negotiates ramps, curbs and stairs (1 rail) with crutches with supervision.  (Target Date: 04/18/2016)   Baseline Met: 04/16/16   Time 4   Period Weeks   Status Achieved   PT SHORT TERM GOAL #5   Title Patient able to reach 7" anteriorly, laterally, across midline and pick up object from floor with prosthesis with supervision.  (Target Date: 04/18/2016)   Baseline Pt able to reach anteriorally, laterally and across midline 7" with supervision, picking object up from floor Min A    Time 4   Period Weeks   Status Partially Met   PT SHORT TERM GOAL #6   Title Pt will be able to ambulate 500' with SPC including grass and unlevel surfaces with supervision (New Target Date: 06/11/2016)   Time 1   Period Months   Status On-going   PT SHORT TERM GOAL #7   Title Pt will be able to  negotiate ramp, curb and stairs with one rail with SPC with supervision (New Target Date: 06/11/2016)   Time 1   Period Months   Status On-going           PT Long Term Goals - 05/21/16 1230    PT LONG TERM GOAL #1   Title Patient verbalizes & demonstrates understanding of prosthetic care to enable safe use of prosthesis. (Target Date: 05/16/2016)   Baseline Met 05/12/16   Time 8   Period Weeks   Status Achieved   PT LONG TERM GOAL #2   Title Patient tolerates wear of prosthesis >90% of  awake hours without skin issues or limb tenderness to enable function throughout his awake hours.  (Target Date: 05/16/2016)   Baseline Met 05/12/16   Time 8   Period Weeks   Status Achieved   PT LONG TERM GOAL #3   Title Berg Balance Test > 42/56 to indicate lower fall risk.  (New Target Date: 07/11/2016)   Baseline 05/12/2016: Berg Balance Score 29/56   Time 2   Period Months   Status On-going   PT LONG TERM GOAL #4   Title Patient ambulates 800' with LRAD & prosthesis outside including grass, ramps & curbs modified independent for community access.   (New Target Date: 07/11/2016)   Baseline 05/12/2016 Pt was able to ambulate 500' with SPC including grass, ramp and curbs with Min Assist    Time 2   Period Months   Status On-going   PT LONG TERM GOAL #5   Title Patient ambulates around furniture carrrying plate & cup with only prosthesis independently.  (New Target Date: 07/11/2016)   Time 2   Period Months   Status On-going   PT LONG TERM GOAL #6   Title Patient able to transfer on/off ground and demonstrate yard work and golf swing with prosthesis modified independent to return to leisure activities.  (New Target Date: 08/25//2017)   Baseline 05/12/2016: Pt was unable to independently attain position without cuing and demostration from PT   Time 2   Period Months   Status On-going   PT LONG TERM GOAL #7   Title Patient will increase Functional Gait Assessment 7/30 to >/= 19/30 to  indicate decreased risk for falls (New Target Date 07/11/16)   Time 2   Period Months   Status On-going               Plan - 05/26/16 2123    Clinical Impression Statement Patient improved weight shift onto prosthesis during stance with movement forward, right, left and backward with instruction.    Rehab Potential Good   PT Frequency 2x / week   PT Duration Other (comment)  9 weeks (60 days)   PT Treatment/Interventions ADLs/Self Care Home Management;DME Instruction;Gait training;Stair training;Functional mobility training;Therapeutic activities;Therapeutic exercise;Balance training;Neuromuscular re-education;Patient/family education;Prosthetic Training   PT Next Visit Plan gait outside surfaces with cane and gait household without device. Gait Trainer program on treadmill.   Consulted and Agree with Plan of Care Patient      Patient will benefit from skilled therapeutic intervention in order to improve the following deficits and impairments:  Abnormal gait, Decreased activity tolerance, Decreased balance, Decreased endurance, Decreased mobility, Postural dysfunction, Prosthetic Dependency  Visit Diagnosis: Other abnormalities of gait and mobility  Unsteadiness on feet  Other symptoms and signs involving the musculoskeletal system     Problem List Patient Active Problem List   Diagnosis Date Noted  . Type 2 diabetes with complication ([place]) 69/62/9528  . Phantom pain after amputation of lower extremity ([place]) 01/18/2016  . Neuropathic pain   . Above knee amputation of right lower extremity ([place]) 12/29/2015  . PAD (peripheral artery disease) ([place]) 12/15/2015  . Hyperlipidemia associated with type 2 diabetes mellitus ([place]) 12/07/2015  . Hypertension associated with diabetes ([place]) 12/04/2014    Isaid Salvia PT, DPT 05/26/2016, 9:26 PM  [facility] [address] [place], [place], [address] Phone:  [phone]   Fax:  [phone]  Name: Adam [name] MRN: [id number] Date of Birth: 20-Jan-1951

## 2016-05-28 ENCOUNTER — Ambulatory Visit: Payer: Medicare Other | Admitting: Physical Therapy

## 2016-05-28 DIAGNOSIS — R2689 Other abnormalities of gait and mobility: Secondary | ICD-10-CM

## 2016-05-28 DIAGNOSIS — R29898 Other symptoms and signs involving the musculoskeletal system: Secondary | ICD-10-CM

## 2016-05-28 DIAGNOSIS — R2681 Unsteadiness on feet: Secondary | ICD-10-CM

## 2016-05-28 DIAGNOSIS — Z89511 Acquired absence of right leg below knee: Secondary | ICD-10-CM | POA: Diagnosis not present

## 2016-05-28 NOTE — Therapy (Signed)
[facility] [address] [employer], [place], [address] Phone: [phone]   Fax:  [phone]  Physical Therapy Treatment  Patient Details  Name: Adam [name] MRN: [id number] Date of Birth: [birth year] Referring Provider: [name], MD  Encounter Date: 05/28/2016      PT End of Session - 05/28/16 1117    Visit Number 19   Number of Visits 31   Date for PT Re-Evaluation 07/11/16   Authorization Type Medicare G-code & progress note   PT Start Time 1021   PT Stop Time 1100   PT Time Calculation (min) 39 min   Activity Tolerance Patient tolerated treatment well;No increased pain   Behavior During Therapy [facility] for tasks assessed/performed      Past Medical History  Diagnosis Date  . Hypertension   . Peripheral vascular disease ([place])   . Gangrene ([place]) 11/22/2015    right great toe  . OSA on CPAP   . Type II diabetes mellitus ([place]) dx'd 11/2015  . PAD (peripheral artery disease) [facility])     Past Surgical History  Procedure Laterality Date  . Peripheral vascular catheterization N/A 11/26/2015    Procedure: Abdominal Aortogram;  Surgeon: [name], MD;  Location: [facility];  Service: Cardiovascular;  Laterality: N/A;  . Vasectomy    . Colonoscopy    . Femoral-popliteal bypass graft Right 11/28/2015    Procedure: RIGHT  FEMORAL-POPLITEAL ARTERY BYPASS GRAFT AND CONSTRUCTION OF MILLER CUFF USING 6 MM X 80 CM PROPATEN  GRAFT ;  Surgeon: Conrad [place], MD;  Location: [facility];  Service: Vascular;  Laterality: Right;  . Amputation Right 12/01/2015    Procedure: AMPUTATION RIGHT GREAT;  Surgeon: Conrad [place], MD;  Location: [facility];  Service: Vascular;  Laterality: Right;  . Amputation Right 12/16/2015    Procedure: AMPUTATION BELOW KNEE;  Surgeon: [name], MD;  Location: [facility];  Service: Vascular;  Laterality: Right;  . Amputation Right 12/26/2015    Procedure: RIGHT ABOVE KNEE AMPUTATION;  Surgeon: [name], MD;  Location: [facility];  Service: Vascular;  Laterality: Right;  . Inguinal hernia repair Bilateral 2000    There were no vitals filed for this visit.      Subjective Assessment - 05/28/16 1025    Subjective Reports wearing 10 ply sock but socket is still loose.   Pertinent History DM, HTN   Limitations Lifting;Standing;Walking   Patient Stated Goals Use prosthesis to play golf, go to beach house including boating, yardwork   Currently in Pain? No/denies                         [facility] Adult PT Treatment/Exercise - 05/28/16 0001    Ambulation/Gait   Ambulation/Gait Yes   Ambulation/Gait Assistance 5: Supervision   Ambulation/Gait Assistance Details cues on weight shifting over prosthesis in stance, fluency of gait & posture. No device inside parallel bars with intermittent UE support with visual, verbal cues   Ambulation Distance (Feet) 200 Feet   Assistive device Prosthesis;Straight cane;None   Gait Pattern Decreased step length - left;Decreased stride length;Decreased hip/knee flexion - right;Decreased weight shift to right;Poor foot clearance - right;Decreased stance time - right;Narrow base of support;Abducted- right   Ambulation Surface Indoor   Prosthetics   Residual limb condition  reports no issues             Balance Exercises - 05/28/16 1111  Balance Exercises: Standing   Standing Eyes Opened Head turns;Foam/compliant surface  standing in foam beam- alt UE upward reaches then cossover with upper trunk rotations Intermittent UE support   Stepping Strategy Anterior  practising anterior wt shift for Left terminal stance;progressing from 2 to no UE support.            PT Education - 05/28/16 1114    Education provided Yes   Education Details progressing understanding and comfort of anterior wt shift for Left terminal stance phase during gait.   Person(s) Educated Patient   Methods Explanation;Demonstration;Verbal cues   Comprehension  Verbalized understanding;Returned demonstration;Verbal cues required;Need further instruction          PT Short Term Goals - 05/21/16 1230    PT SHORT TERM GOAL #1   Title Patient tolerates wear of prosthesis >10hrs total /day without skin issues or limb pain. (Target Date: 04/18/2016)   Baseline Met: 04/16/16   Time 4   Period Weeks   Status Achieved   PT SHORT TERM GOAL #2   Title Patient demonstrates proper donning & verbalizes proper cleaning of prosthesis.  (Target Date: 04/18/2016)   Baseline Met: 04/16/16   Time 4   Period Weeks   Status Achieved   PT SHORT TERM GOAL #3   Title Patient ambulates 200' with crutches & prosthesis safely with cues only for deviations not physical assist.  (Target Date: 04/18/2016)   Baseline Met: 04/16/16   Time 4   Period Weeks   Status Achieved   PT SHORT TERM GOAL #4   Title Patient negotiates ramps, curbs and stairs (1 rail) with crutches with supervision.  (Target Date: 04/18/2016)   Baseline Met: 04/16/16   Time 4   Period Weeks   Status Achieved   PT SHORT TERM GOAL #5   Title Patient able to reach 7" anteriorly, laterally, across midline and pick up object from floor with prosthesis with supervision.  (Target Date: 04/18/2016)   Baseline Pt able to reach anteriorally, laterally and across midline 7" with supervision, picking object up from floor Min A    Time 4   Period Weeks   Status Partially Met   PT SHORT TERM GOAL #6   Title Pt will be able to ambulate 500' with SPC including grass and unlevel surfaces with supervision (New Target Date: 06/11/2016)   Time 1   Period Months   Status On-going   PT SHORT TERM GOAL #7   Title Pt will be able to negotiate ramp, curb and stairs with one rail with SPC with supervision (New Target Date: 06/11/2016)   Time 1   Period Months   Status On-going           PT Long Term Goals - 05/21/16 1230    PT LONG TERM GOAL #1   Title Patient verbalizes & demonstrates understanding of prosthetic  care to enable safe use of prosthesis. (Target Date: 05/16/2016)   Baseline Met 05/12/16   Time 8   Period Weeks   Status Achieved   PT LONG TERM GOAL #2   Title Patient tolerates wear of prosthesis >90% of awake hours without skin issues or limb tenderness to enable function throughout his awake hours.  (Target Date: 05/16/2016)   Baseline Met 05/12/16   Time 8   Period Weeks   Status Achieved   PT LONG TERM GOAL #3   Title Berg Balance Test > 42/56 to indicate lower fall risk.  (New Target Date: 07/11/2016)  Baseline 05/12/2016: Berg Balance Score 29/56   Time 2   Period Months   Status On-going   PT LONG TERM GOAL #4   Title Patient ambulates 800' with LRAD & prosthesis outside including grass, ramps & curbs modified independent for community access.   (New Target Date: 07/11/2016)   Baseline 05/12/2016 Pt was able to ambulate 500' with SPC including grass, ramp and curbs with Min Assist    Time 2   Period Months   Status On-going   PT LONG TERM GOAL #5   Title Patient ambulates around furniture carrrying plate & cup with only prosthesis independently.  (New Target Date: 07/11/2016)   Time 2   Period Months   Status On-going   PT LONG TERM GOAL #6   Title Patient able to transfer on/off ground and demonstrate yard work and golf swing with prosthesis modified independent to return to leisure activities.  (New Target Date: 08/25//2017)   Baseline 05/12/2016: Pt was unable to independently attain position without cuing and demostration from PT   Time 2   Period Months   Status On-going   PT LONG TERM GOAL #7   Title Patient will increase Functional Gait Assessment 7/30 to >/= 19/30 to indicate decreased risk for falls (New Target Date 07/11/16)   Time 2   Period Months   Status On-going               Plan - 05/28/16 1117    Clinical Impression Statement Pt able to progress gait mechanics and posture and fluency of movement with increase anterior weight shift on Left  LE  during terminal stance phase during session with instruction.   Rehab Potential Good   PT Frequency 2x / week   PT Duration Other (comment)  9 weeks (60 days)   PT Treatment/Interventions ADLs/Self Care Home Management;DME Instruction;Gait training;Stair training;Functional mobility training;Therapeutic activities;Therapeutic exercise;Balance training;Neuromuscular re-education;Patient/family education;Prosthetic Training   PT Next Visit Plan G-code; gait outside surfaces with cane and gait household without device. Gait Trainer program on treadmill.   Consulted and Agree with Plan of Care Patient      Patient will benefit from skilled therapeutic intervention in order to improve the following deficits and impairments:  Abnormal gait, Decreased activity tolerance, Decreased balance, Decreased endurance, Decreased mobility, Postural dysfunction, Prosthetic Dependency  Visit Diagnosis: Other abnormalities of gait and mobility  Unsteadiness on feet  Other symptoms and signs involving the musculoskeletal system     Problem List Patient Active Problem List   Diagnosis Date Noted  . Type 2 diabetes with complication ([place]) 98/92/1194  . Phantom pain after amputation of lower extremity ([place]) 01/18/2016  . Neuropathic pain   . Above knee amputation of right lower extremity ([place]) 12/29/2015  . PAD (peripheral artery disease) ([place]) 12/15/2015  . Hyperlipidemia associated with type 2 diabetes mellitus ([place]) 12/07/2015  . Hypertension associated with diabetes ([place]) 12/04/2014    Bjorn Loser, PTA  05/28/2016, 11:26 AM [facility] [address] [place], [place], [address] Phone: [phone]   Fax:  [phone]  Name: DARRIO BADE MRN: [id number] Date of Birth: 1951/10/30

## 2016-06-02 ENCOUNTER — Ambulatory Visit: Payer: Medicare Other | Admitting: Physical Therapy

## 2016-06-02 ENCOUNTER — Encounter: Payer: Self-pay | Admitting: Physical Therapy

## 2016-06-02 DIAGNOSIS — R29898 Other symptoms and signs involving the musculoskeletal system: Secondary | ICD-10-CM

## 2016-06-02 DIAGNOSIS — Z89511 Acquired absence of right leg below knee: Secondary | ICD-10-CM | POA: Diagnosis not present

## 2016-06-02 DIAGNOSIS — R2681 Unsteadiness on feet: Secondary | ICD-10-CM

## 2016-06-02 DIAGNOSIS — R2689 Other abnormalities of gait and mobility: Secondary | ICD-10-CM | POA: Diagnosis not present

## 2016-06-02 NOTE — Therapy (Signed)
[facility] [address] [place], [place], [address] Phone: [phone]   Fax:  [phone]  Physical Therapy Treatment  Patient Details  Name: Adam [name] MRN: [id number] Date of Birth: January 21, 1951 Referring Provider: [name], MD  Encounter Date: 06/02/2016      PT End of Session - 06/02/16 2218    Visit Number 20   Number of Visits 31   Date for PT Re-Evaluation 07/11/16   Authorization Type Medicare G-code & progress note   PT Start Time 1017   PT Stop Time 1100   PT Time Calculation (min) 43 min   Equipment Utilized During Treatment Gait belt   Activity Tolerance Patient tolerated treatment well;No increased pain   Behavior During Therapy [facility] for tasks assessed/performed      Past Medical History  Diagnosis Date  . Hypertension   . Peripheral vascular disease ([place])   . Gangrene ([place]) 11/22/2015    right great toe  . OSA on CPAP   . Type II diabetes mellitus ([place]) dx'd 11/2015  . PAD (peripheral artery disease) [facility])     Past Surgical History  Procedure Laterality Date  . Peripheral vascular catheterization N/A 11/26/2015    Procedure: Abdominal Aortogram;  Surgeon: [name], MD;  Location: [place] [facility];  Service: Cardiovascular;  Laterality: N/A;  . Vasectomy    . Colonoscopy    . Femoral-popliteal bypass graft Right 11/28/2015    Procedure: RIGHT  FEMORAL-POPLITEAL ARTERY BYPASS GRAFT AND CONSTRUCTION OF MILLER CUFF USING 6 MM X 80 CM PROPATEN  GRAFT ;  Surgeon: Conrad Kennebec, MD;  Location: [place];  Service: Vascular;  Laterality: Right;  . Amputation Right 12/01/2015    Procedure: AMPUTATION RIGHT GREAT;  Surgeon: Conrad Granville, MD;  Location: [place];  Service: Vascular;  Laterality: Right;  . Amputation Right 12/16/2015    Procedure: AMPUTATION BELOW KNEE;  Surgeon: [name], MD;  Location: [place];  Service: Vascular;  Laterality: Right;  . Amputation Right 12/26/2015    Procedure:  RIGHT ABOVE KNEE AMPUTATION;  Surgeon: [name], MD;  Location: [place];  Service: Vascular;  Laterality: Right;  . Inguinal hernia repair Bilateral 2000    There were no vitals filed for this visit.      Subjective Assessment - 06/02/16 1019    Subjective He reports he is having to flucuate socks a lot.    Pertinent History DM, HTN   Limitations Lifting;Standing;Walking   Patient Stated Goals Use prosthesis to play golf, go to beach house including boating, yardwork   Currently in Pain? No/denies                         [facility] Adult PT Treatment/Exercise - 06/02/16 2210    Transfers   Transfers Sit to Stand;Stand to Sit   Sit to Stand 5: Supervision;With upper extremity assist;From chair/3-in-1   Sit to Stand Details (indicate cue type and reason) technique to engage prosthetic knee & initiate gait without hestancy   Ambulation/Gait   Ambulation/Gait Yes   Ambulation/Gait Assistance 5: Supervision   Ambulation/Gait Assistance Details cues on upright posture, changing direction 90* using pelvic motion.   Ambulation Distance (Feet) 300 Feet  300' X 2   Assistive device Prosthesis;None;Straight cane   Gait Pattern Decreased step length - left;Decreased stride length;Decreased hip/knee flexion - right;Decreased weight shift to right;Poor foot clearance - right;Decreased stance time - right;Narrow base  of support;Abducted- right   Ambulation Surface Indoor;Level   Ramp 5: Supervision  cane & prosthesis   Ramp Details (indicate cue type and reason) cues on weight shift in stance   Curb 5: Supervision  cane & prosthesis   Neuro Re-ed    Neuro Re-ed Details  balance activity of 4-square stepping with counter & chair backs for safety.    Prosthetics   Prosthetic Care Comments  PT setup appt with prosthetist tomorrow, 7/18 to add pads to socket and alignment check.   Current prosthetic wear tolerance (days/week)  daily   Current prosthetic wear tolerance (#hours/day)   all awake hours   Residual limb condition  reports no issues                  PT Short Term Goals - 05/21/16 1230    PT SHORT TERM GOAL #1   Title Patient tolerates wear of prosthesis >10hrs total /day without skin issues or limb pain. (Target Date: 04/18/2016)   Baseline Met: 04/16/16   Time 4   Period Weeks   Status Achieved   PT SHORT TERM GOAL #2   Title Patient demonstrates proper donning & verbalizes proper cleaning of prosthesis.  (Target Date: 04/18/2016)   Baseline Met: 04/16/16   Time 4   Period Weeks   Status Achieved   PT SHORT TERM GOAL #3   Title Patient ambulates 200' with crutches & prosthesis safely with cues only for deviations not physical assist.  (Target Date: 04/18/2016)   Baseline Met: 04/16/16   Time 4   Period Weeks   Status Achieved   PT SHORT TERM GOAL #4   Title Patient negotiates ramps, curbs and stairs (1 rail) with crutches with supervision.  (Target Date: 04/18/2016)   Baseline Met: 04/16/16   Time 4   Period Weeks   Status Achieved   PT SHORT TERM GOAL #5   Title Patient able to reach 7" anteriorly, laterally, across midline and pick up object from floor with prosthesis with supervision.  (Target Date: 04/18/2016)   Baseline Pt able to reach anteriorally, laterally and across midline 7" with supervision, picking object up from floor Min A    Time 4   Period Weeks   Status Partially Met   PT SHORT TERM GOAL #6   Title Pt will be able to ambulate 500' with SPC including grass and unlevel surfaces with supervision (New Target Date: 06/11/2016)   Time 1   Period Months   Status On-going   PT SHORT TERM GOAL #7   Title Pt will be able to negotiate ramp, curb and stairs with one rail with SPC with supervision (New Target Date: 06/11/2016)   Time 1   Period Months   Status On-going           PT Long Term Goals - 05/21/16 1230    PT LONG TERM GOAL #1   Title Patient verbalizes & demonstrates understanding of prosthetic care to enable safe  use of prosthesis. (Target Date: 05/16/2016)   Baseline Met 05/12/16   Time 8   Period Weeks   Status Achieved   PT LONG TERM GOAL #2   Title Patient tolerates wear of prosthesis >90% of awake hours without skin issues or limb tenderness to enable function throughout his awake hours.  (Target Date: 05/16/2016)   Baseline Met 05/12/16   Time 8   Period Weeks   Status Achieved   PT LONG TERM GOAL #3   Title Sharlene Motts  Balance Test > 42/56 to indicate lower fall risk.  (New Target Date: 07/11/2016)   Baseline 05/12/2016: Berg Balance Score 29/56   Time 2   Period Months   Status On-going   PT LONG TERM GOAL #4   Title Patient ambulates 800' with LRAD & prosthesis outside including grass, ramps & curbs modified independent for community access.   (New Target Date: 07/11/2016)   Baseline 05/12/2016 Pt was able to ambulate 500' with SPC including grass, ramp and curbs with Min Assist    Time 2   Period Months   Status On-going   PT LONG TERM GOAL #5   Title Patient ambulates around furniture carrrying plate & cup with only prosthesis independently.  (New Target Date: 07/11/2016)   Time 2   Period Months   Status On-going   PT LONG TERM GOAL #6   Title Patient able to transfer on/off ground and demonstrate yard work and golf swing with prosthesis modified independent to return to leisure activities.  (New Target Date: 08/25//2017)   Baseline 05/12/2016: Pt was unable to independently attain position without cuing and demostration from PT   Time 2   Period Months   Status On-going   PT LONG TERM GOAL #7   Title Patient will increase Functional Gait Assessment 7/30 to >/= 19/30 to indicate decreased risk for falls (New Target Date 07/11/16)   Time 2   Period Months   Status On-going               Plan - June 16, 2016 February 05, 2217    Clinical Impression Statement Patient improved ability to change directions using pelvis and weight shift. He ambulated without device with prosthesis improved weight  shift over prosthesis.    Rehab Potential Good   PT Frequency 2x / week   PT Duration Other (comment)  9 weeks (60 days)   PT Treatment/Interventions ADLs/Self Care Home Management;DME Instruction;Gait training;Stair training;Functional mobility training;Therapeutic activities;Therapeutic exercise;Balance training;Neuromuscular re-education;Patient/family education;Prosthetic Training   PT Next Visit Plan Set new G-Code using mobility. gait outside surfaces with cane and gait household without device. Gait Trainer program on treadmill.   Consulted and Agree with Plan of Care Patient      Patient will benefit from skilled therapeutic intervention in order to improve the following deficits and impairments:  Abnormal gait, Decreased activity tolerance, Decreased balance, Decreased endurance, Decreased mobility, Postural dysfunction, Prosthetic Dependency  Visit Diagnosis: Other abnormalities of gait and mobility  Unsteadiness on feet  Other symptoms and signs involving the musculoskeletal system       G-Codes - 06-16-16 06-Feb-2219    Functional Assessment Tool Used wears prosthesis all awake hours without issues. He verbalizes proper prosthetic care.   Functional Limitation Self care   Self Care Goal Status [phone]) At least 1 percent but less than 20 percent impaired, limited or restricted   Self Care Discharge Status [phone]) At least 1 percent but less than 20 percent impaired, limited or restricted      Problem List Patient Active Problem List   Diagnosis Date Noted  . Type 2 diabetes with complication ([place]) 25/42/7062  . Phantom pain after amputation of lower extremity ([place]) 01/18/2016  . Neuropathic pain   . Above knee amputation of right lower extremity ([place]) 12/29/2015  . PAD (peripheral artery disease) ([place]) 12/15/2015  . Hyperlipidemia associated with type 2 diabetes mellitus ([place]) 12/07/2015  . Hypertension associated with diabetes ([place]) 12/04/2014    Shaquelle Hernon PT,  DPT 2016-06-16, 10:24 PM  [place]  [facility] [address] [place], [place], [address] Phone: [phone]   Fax:  [phone]  Name: Adam [name] MRN: [id number] Date of Birth: [birth year]

## 2016-06-04 ENCOUNTER — Ambulatory Visit: Payer: Medicare Other | Admitting: Physical Therapy

## 2016-06-04 ENCOUNTER — Encounter: Payer: Self-pay | Admitting: Physical Therapy

## 2016-06-04 DIAGNOSIS — R2681 Unsteadiness on feet: Secondary | ICD-10-CM | POA: Diagnosis not present

## 2016-06-04 DIAGNOSIS — R29898 Other symptoms and signs involving the musculoskeletal system: Secondary | ICD-10-CM | POA: Diagnosis not present

## 2016-06-04 DIAGNOSIS — Z89511 Acquired absence of right leg below knee: Secondary | ICD-10-CM | POA: Diagnosis not present

## 2016-06-04 DIAGNOSIS — R2689 Other abnormalities of gait and mobility: Secondary | ICD-10-CM | POA: Diagnosis not present

## 2016-06-05 NOTE — Therapy (Signed)
[facility] [address] [place], [place], [address] Phone: [phone]   Fax:  [phone]  Physical Therapy Treatment  Patient Details  Name: Adam [name] MRN: [id number] Date of Birth: February 16, 1951 Referring Provider: [name], MD  Encounter Date: 06/04/2016      PT End of Session - 06/04/16 1100    Visit Number 21   Number of Visits 31   Date for PT Re-Evaluation 07/11/16   Authorization Type Medicare G-code & progress note   PT Start Time 1016   PT Stop Time 1100   PT Time Calculation (min) 44 min   Equipment Utilized During Treatment Gait belt   Activity Tolerance Patient tolerated treatment well;No increased pain   Behavior During Therapy [facility] for tasks assessed/performed      Past Medical History  Diagnosis Date  . Hypertension   . Peripheral vascular disease ([place])   . Gangrene ([place]) 11/22/2015    right great toe  . OSA on CPAP   . Type II diabetes mellitus ([place]) dx'd 11/2015  . PAD (peripheral artery disease) [facility])     Past Surgical History  Procedure Laterality Date  . Peripheral vascular catheterization N/A 11/26/2015    Procedure: Abdominal Aortogram;  Surgeon: [name], MD;  Location: [place] [facility];  Service: Cardiovascular;  Laterality: N/A;  . Vasectomy    . Colonoscopy    . Femoral-popliteal bypass graft Right 11/28/2015    Procedure: RIGHT  FEMORAL-POPLITEAL ARTERY BYPASS GRAFT AND CONSTRUCTION OF MILLER CUFF USING 6 MM X 80 CM PROPATEN  GRAFT ;  Surgeon: Conrad [place], MD;  Location: [place];  Service: Vascular;  Laterality: Right;  . Amputation Right 12/01/2015    Procedure: AMPUTATION RIGHT GREAT;  Surgeon: Conrad [place], MD;  Location: [place];  Service: Vascular;  Laterality: Right;  . Amputation Right 12/16/2015    Procedure: AMPUTATION BELOW KNEE;  Surgeon: [name], MD;  Location: [place];  Service: Vascular;  Laterality: Right;  . Amputation Right 12/26/2015    Procedure:  RIGHT ABOVE KNEE AMPUTATION;  Surgeon: [name], MD;  Location: [place];  Service: Vascular;  Laterality: Right;  . Inguinal hernia repair Bilateral 2000    There were no vitals filed for this visit.      Subjective Assessment - 06/04/16 1020    Subjective He saw prosthetist added a pad in back of socket and made adjustments. It feels better but he will probably need a new socket in the next few weeks.    Pertinent History DM, HTN   Limitations Lifting;Standing;Walking   Patient Stated Goals Use prosthesis to play golf, go to beach house including boating, yardwork   Currently in Pain? No/denies            [facility] PT Assessment - 06/04/16 1015    Functional Gait  Assessment   Gait assessed  Yes   Gait Level Surface Walks 20 ft, slow speed, abnormal gait pattern, evidence for imbalance or deviates 10-15 in outside of the 12 in walkway width. Requires more than 7 sec to ambulate 20 ft.   Change in Gait Speed Makes only minor adjustments to walking speed, or accomplishes a change in speed with significant gait deviations, deviates 10-15 in outside the 12 in walkway width, or changes speed but loses balance but is able to recover and continue walking.   Gait with Horizontal Head Turns Performs head turns with moderate changes in gait velocity, slows  down, deviates 10-15 in outside 12 in walkway width but recovers, can continue to walk.   Gait with Vertical Head Turns Performs task with moderate change in gait velocity, slows down, deviates 10-15 in outside 12 in walkway width but recovers, can continue to walk.   Gait and Pivot Turn Turns slowly, requires verbal cueing, or requires several small steps to catch balance following turn and stop   Step Over Obstacle Is able to step over one shoe box (4.5 in total height) but must slow down and adjust steps to clear box safely. May require verbal cueing.   Gait with Narrow Base of Support Ambulates less than 4 steps heel to toe or cannot perform  without assistance.   Gait with Eyes Closed Walks 20 ft, slow speed, abnormal gait pattern, evidence for imbalance, deviates 10-15 in outside 12 in walkway width. Requires more than 9 sec to ambulate 20 ft.   Ambulating Backwards Walks 20 ft, slow speed, abnormal gait pattern, evidence for imbalance, deviates 10-15 in outside 12 in walkway width.   Steps Two feet to a stair, must use rail.   Total Score 9                     [facility] Adult PT Treatment/Exercise - 06/04/16 1015    Transfers   Transfers Sit to Stand;Stand to Sit   Sit to Stand 5: Supervision;With upper extremity assist;From chair/3-in-1   Sit to Stand Details (indicate cue type and reason) minimal verbal cues to engage prosthetic upon arising   Ambulation/Gait   Ambulation/Gait Yes   Ambulation/Gait Assistance 5: Supervision;4: Min assist;4: Min guard  SBA w/cane, No device MinA head turns, Minguard straight pat   Ambulation/Gait Assistance Details worked on turning90* with weight shift and step thru pattern near counter for support; turning head side/side, up/down & diagonals maintaining path in hall for visual reference with tactile & manual cues; carrying full cup of water.   lateral whip w/ recent alignment changes, appt prosthetist    Ambulation Distance (Feet) 500 Feet  500' X 2   Assistive device Prosthesis;None;Straight cane   Gait Pattern Decreased step length - left;Decreased stride length;Decreased hip/knee flexion - right;Decreased weight shift to right;Poor foot clearance - right;Decreased stance time - right;Narrow base of support;Abducted- right   Ambulation Surface Indoor;Level   Ramp 5: Supervision  cane & prosthesis   Curb 5: Supervision  cane & prosthesis   High Level Balance   High Level Balance Activities Side stepping;Backward walking;Turns;Weight-shifting turns   Therapeutic Activites    Therapeutic Activities Other Therapeutic Activities   Other Therapeutic Activities PT demo, instructed  in climbing A-frame ladder and simulated 2-handed task on ladder. Pt return demo understanding with verbal & tacitle cues with supervision.    Prosthetics   Prosthetic Care Comments  PT setup appt with prosthetist tomorrow, 7/18 to add pads to socket and alignment check.   Current prosthetic wear tolerance (days/week)  daily   Current prosthetic wear tolerance (#hours/day)  all awake hours   Residual limb condition  reports no issues                  PT Short Term Goals - 05/21/16 1230    PT SHORT TERM GOAL #1   Title Patient tolerates wear of prosthesis >10hrs total /day without skin issues or limb pain. (Target Date: 04/18/2016)   Baseline Met: 04/16/16   Time 4   Period Weeks   Status Achieved   PT  SHORT TERM GOAL #2   Title Patient demonstrates proper donning & verbalizes proper cleaning of prosthesis.  (Target Date: 04/18/2016)   Baseline Met: 04/16/16   Time 4   Period Weeks   Status Achieved   PT SHORT TERM GOAL #3   Title Patient ambulates 200' with crutches & prosthesis safely with cues only for deviations not physical assist.  (Target Date: 04/18/2016)   Baseline Met: 04/16/16   Time 4   Period Weeks   Status Achieved   PT SHORT TERM GOAL #4   Title Patient negotiates ramps, curbs and stairs (1 rail) with crutches with supervision.  (Target Date: 04/18/2016)   Baseline Met: 04/16/16   Time 4   Period Weeks   Status Achieved   PT SHORT TERM GOAL #5   Title Patient able to reach 7" anteriorly, laterally, across midline and pick up object from floor with prosthesis with supervision.  (Target Date: 04/18/2016)   Baseline Pt able to reach anteriorally, laterally and across midline 7" with supervision, picking object up from floor Min A    Time 4   Period Weeks   Status Partially Met   PT SHORT TERM GOAL #6   Title Pt will be able to ambulate 500' with SPC including grass and unlevel surfaces with supervision (New Target Date: 06/11/2016)   Time 1   Period Months    Status On-going   PT SHORT TERM GOAL #7   Title Pt will be able to negotiate ramp, curb and stairs with one rail with SPC with supervision (New Target Date: 06/11/2016)   Time 1   Period Months   Status On-going           PT Long Term Goals - 05/21/16 1230    PT LONG TERM GOAL #1   Title Patient verbalizes & demonstrates understanding of prosthetic care to enable safe use of prosthesis. (Target Date: 05/16/2016)   Baseline Met 05/12/16   Time 8   Period Weeks   Status Achieved   PT LONG TERM GOAL #2   Title Patient tolerates wear of prosthesis >90% of awake hours without skin issues or limb tenderness to enable function throughout his awake hours.  (Target Date: 05/16/2016)   Baseline Met 05/12/16   Time 8   Period Weeks   Status Achieved   PT LONG TERM GOAL #3   Title Berg Balance Test > 42/56 to indicate lower fall risk.  (New Target Date: 07/11/2016)   Baseline 05/12/2016: Berg Balance Score 29/56   Time 2   Period Months   Status On-going   PT LONG TERM GOAL #4   Title Patient ambulates 800' with LRAD & prosthesis outside including grass, ramps & curbs modified independent for community access.   (New Target Date: 07/11/2016)   Baseline 05/12/2016 Pt was able to ambulate 500' with SPC including grass, ramp and curbs with Min Assist    Time 2   Period Months   Status On-going   PT LONG TERM GOAL #5   Title Patient ambulates around furniture carrrying plate & cup with only prosthesis independently.  (New Target Date: 07/11/2016)   Time 2   Period Months   Status On-going   PT LONG TERM GOAL #6   Title Patient able to transfer on/off ground and demonstrate yard work and golf swing with prosthesis modified independent to return to leisure activities.  (New Target Date: 08/25//2017)   Baseline 05/12/2016: Pt was unable to independently attain position without cuing and  demostration from PT   Time 2   Period Months   Status On-going   PT LONG TERM GOAL #7   Title Patient  will increase Functional Gait Assessment 7/30 to >/= 19/30 to indicate decreased risk for falls (New Target Date 07/11/16)   Time 2   Period Months   Status On-going               Plan - July 04, 2016 1100    Clinical Impression Statement Patient improved ability to scan environment with head turns and maintain path with skilled instructions. He also improved control of prosthesis & balance with instruction in turning 90* to both right & left.    Rehab Potential Good   PT Frequency 2x / week   PT Duration Other (comment)  9 weeks (60 days)   PT Treatment/Interventions ADLs/Self Care Home Management;DME Instruction;Gait training;Stair training;Functional mobility training;Therapeutic activities;Therapeutic exercise;Balance training;Neuromuscular re-education;Patient/family education;Prosthetic Training   PT Next Visit Plan Check STGs, gait outside surfaces with cane and gait household without device. Gait Trainer program on treadmill.   Consulted and Agree with Plan of Care Patient      Patient will benefit from skilled therapeutic intervention in order to improve the following deficits and impairments:  Abnormal gait, Decreased activity tolerance, Decreased balance, Decreased endurance, Decreased mobility, Postural dysfunction, Prosthetic Dependency  Visit Diagnosis: Other abnormalities of gait and mobility  Unsteadiness on feet  Other symptoms and signs involving the musculoskeletal system       G-Codes - 07-04-16 1100    Functional Assessment Tool Used Functional Gait Assessment 9/30   Functional Limitation Mobility: Walking and moving around   Mobility: Walking and Moving Around Current Status [phone]) At least 60 percent but less than 80 percent impaired, limited or restricted   Mobility: Walking and Moving Around Goal Status [phone]) At least 20 percent but less than 40 percent impaired, limited or restricted      Problem List Patient Active Problem List   Diagnosis Date  Noted  . Type 2 diabetes with complication ([place]) 70/35/0093  . Phantom pain after amputation of lower extremity ([place]) 01/18/2016  . Neuropathic pain   . Above knee amputation of right lower extremity ([place]) 12/29/2015  . PAD (peripheral artery disease) ([place]) 12/15/2015  . Hyperlipidemia associated with type 2 diabetes mellitus ([place]) 12/07/2015  . Hypertension associated with diabetes ([place]) 12/04/2014    Sybil Shrader PT, DPT 06/05/2016, 10:05 AM  [facility] [address] [place], [place], [address] Phone: [phone]   Fax:  [phone]  Name: RONALD LONDO MRN: [id number] Date of Birth: 1951/10/22

## 2016-06-09 ENCOUNTER — Ambulatory Visit: Payer: Medicare Other | Admitting: Physical Therapy

## 2016-06-09 DIAGNOSIS — R2681 Unsteadiness on feet: Secondary | ICD-10-CM | POA: Diagnosis not present

## 2016-06-09 DIAGNOSIS — R29898 Other symptoms and signs involving the musculoskeletal system: Secondary | ICD-10-CM | POA: Diagnosis not present

## 2016-06-09 DIAGNOSIS — R2689 Other abnormalities of gait and mobility: Secondary | ICD-10-CM | POA: Diagnosis not present

## 2016-06-09 DIAGNOSIS — Z89511 Acquired absence of right leg below knee: Secondary | ICD-10-CM | POA: Diagnosis not present

## 2016-06-09 NOTE — Therapy (Signed)
[facility] [address] [place], [place], [address] Phone: [phone]   Fax:  [phone]  Physical Therapy Treatment  Patient Details  Name: Adam [name] MRN: [id number] Date of Birth: 1951/08/03 Referring Provider: [name], MD  Encounter Date: 06/09/2016      PT End of Session - 06/09/16 1310    Visit Number 22   Number of Visits 31   Date for PT Re-Evaluation 07/11/16   Authorization Type Medicare G-code & progress note   PT Start Time 1017   PT Stop Time 1059   PT Time Calculation (min) 42 min   Equipment Utilized During Treatment Gait belt   Activity Tolerance Patient tolerated treatment well;No increased pain   Behavior During Therapy WFL for tasks assessed/performed      Past Medical History:  Diagnosis Date  . Gangrene ([place]) 11/22/2015   right great toe  . Hypertension   . OSA on CPAP   . PAD (peripheral artery disease) ([place])   . Peripheral vascular disease ([place])   . Type II diabetes mellitus ([place]) dx'd 11/2015    Past Surgical History:  Procedure Laterality Date  . AMPUTATION Right 12/01/2015   Procedure: AMPUTATION RIGHT GREAT;  Surgeon: Conrad Brentwood, MD;  Location: [facility];  Service: Vascular;  Laterality: Right;  . AMPUTATION Right 12/16/2015   Procedure: AMPUTATION BELOW KNEE;  Surgeon: [name], MD;  Location: [facility];  Service: Vascular;  Laterality: Right;  . AMPUTATION Right 12/26/2015   Procedure: RIGHT ABOVE KNEE AMPUTATION;  Surgeon: [name], MD;  Location: [facility];  Service: Vascular;  Laterality: Right;  . COLONOSCOPY    . FEMORAL-POPLITEAL BYPASS GRAFT Right 11/28/2015   Procedure: RIGHT  FEMORAL-POPLITEAL ARTERY BYPASS GRAFT AND CONSTRUCTION OF MILLER CUFF USING 6 MM X 80 CM PROPATEN  GRAFT ;  Surgeon: Conrad Thoreau, MD;  Location: [facility];  Service: Vascular;  Laterality: Right;  . INGUINAL HERNIA REPAIR Bilateral 2000  . PERIPHERAL VASCULAR CATHETERIZATION N/A 11/26/2015    Procedure: Abdominal Aortogram;  Surgeon: [name], MD;  Location: [facility];  Service: Cardiovascular;  Laterality: N/A;  . VASECTOMY      There were no vitals filed for this visit.      Subjective Assessment - 06/09/16 1020    Subjective "I saw Mortimer Fries (prosthetist) last week. He made some adjustments on my right leg; now it feels too long. It's very uncomfrotable."   Pertinent History Likes to be called "[name]".     DM, HTN   Limitations Lifting;Standing;Walking   Patient Stated Goals Use prosthesis to play golf, go to beach house including boating, yardwork   Currently in Pain? No/denies                         [facility] Adult PT Treatment/Exercise - 06/09/16 0001      Ambulation/Gait   Ambulation/Gait Yes   Ambulation/Gait Assistance 5: Supervision;4: Min [name];3: Mod assist   Ambulation/Gait Assistance Details Gait x500' over unlevel, paved and grass surfaces with SPC and (S), no overt LOB. Performed gait 2 x230' and 6 x75' without AD with cueing for full lateral weight shift to R side during L stance, anterior weight shift during LLE advancement (due to noted R hand posterior to body suggests posterior preference); tactile/resistive cueing at R ASIS during R stance to promote pelvic protraction/gluteus max activation, and verbal cueing for forward gaze.   Ambulation Distance (  Feet) 1410 Feet  total   Assistive device Prostheses;None;Straight cane   Gait Pattern Decreased step length - left;Decreased hip/knee flexion - right;Decreased weight shift to right;Decreased stance time - right;Abducted- right;Decreased arm swing - right;Right circumduction  RLE circumduction at times (especially outdoors)   Ambulation Surface Level;Unlevel;Indoor;Outdoor;Paved;Grass   Stairs Yes   Stairs Assistance 6: Modified independent (Device/Increase time)   Stair Management Technique One rail Right;Step to pattern;Forwards   Number of Stairs 8   Height of Stairs 6    Ramp 5: Supervision;4: Min assist   Ramp Details (indicate cue type and reason) (S) with SPC; min [name] without AD   Curb 5: Supervision   Curb Details (indicate cue type and reason) (S) with SPC; min [name] without AD   Pre-Gait Activities At // bars, performed blocked practice of LLE advancement with concurrent RUE superior reaching to promote R trunk elongation during R stance (due to pt tendency toward R trunk shortening).     High Level Balance   High Level Balance Activities Side stepping;Backward walking;Direction changes;Turns   High Level Balance Comments Retro gait 3 x50' with min [name], cueing for full lateral weight shift to R. Lateral stepping x10 steps to L with (S)/no overt LOB; 2 x10 steps to R with cueing to address RLE abduction, full anterior weight shift to R side.  without AD                  PT Short Term Goals - 06/09/16 1030      PT SHORT TERM GOAL #1   Title Patient tolerates wear of prosthesis >10hrs total /day without skin issues or limb pain. (Target Date: 04/18/2016)   Baseline Met: 04/16/16   Time 4   Period Weeks   Status Achieved     PT SHORT TERM GOAL #2   Title Patient demonstrates proper donning & verbalizes proper cleaning of prosthesis.  (Target Date: 04/18/2016)   Baseline Met: 04/16/16   Time 4   Period Weeks   Status Achieved     PT SHORT TERM GOAL #3   Title Patient ambulates 200' with crutches & prosthesis safely with cues only for deviations not physical assist.  (Target Date: 04/18/2016)   Baseline Met: 04/16/16   Time 4   Period Weeks   Status Achieved     PT SHORT TERM GOAL #4   Title Patient negotiates ramps, curbs and stairs (1 rail) with crutches with supervision.  (Target Date: 04/18/2016)   Baseline Met: 04/16/16   Status Achieved     PT SHORT TERM GOAL #5   Title Patient able to reach 7" anteriorly, laterally, across midline and pick up object from floor with prosthesis with supervision.  (Target Date: 04/18/2016)    Baseline Pt able to reach anteriorally, laterally and across midline 7" with supervision, picking object up from floor Min A    Time 4   Period Weeks   Status Partially Met     Additional Short Term Goals   Additional Short Term Goals Yes     PT SHORT TERM GOAL #6   Title Pt will be able to ambulate 500' with SPC including grass and unlevel surfaces with supervision (New Target Date: 06/11/2016)   Baseline Met 7/24.   Time 1   Period Months   Status Achieved     PT SHORT TERM GOAL #7   Title Pt will be able to negotiate ramp, curb and stairs with one rail with [facility] with supervision (New Target  Date: 06/11/2016)   Baseline Met 7/24.   Time 1   Status Achieved           PT Long Term Goals - 05/21/16 1230      PT LONG TERM GOAL #1   Title Patient verbalizes & demonstrates understanding of prosthetic care to enable safe use of prosthesis. (Target Date: 05/16/2016)   Baseline Met 05/12/16   Time 8   Period Weeks   Status Achieved     PT LONG TERM GOAL #2   Title Patient tolerates wear of prosthesis >90% of awake hours without skin issues or limb tenderness to enable function throughout his awake hours.  (Target Date: 05/16/2016)   Baseline Met 05/12/16   Time 8   Period Weeks   Status Achieved     PT LONG TERM GOAL #3   Title Berg Balance Test > 42/56 to indicate lower fall risk.  (New Target Date: 07/11/2016)   Baseline 05/12/2016: Berg Balance Score 29/56   Time 2   Period Months   Status On-going     PT LONG TERM GOAL #4   Title Patient ambulates 800' with LRAD & prosthesis outside including grass, ramps & curbs modified independent for community access.   (New Target Date: 07/11/2016)   Baseline 05/12/2016 Pt was able to ambulate 500' with SPC including grass, ramp and curbs with Min Assist    Time 2   Period Months   Status On-going     PT LONG TERM GOAL #5   Title Patient ambulates around furniture carrrying plate & cup with only prosthesis independently.  (New  Target Date: 07/11/2016)   Time 2   Period Months   Status On-going     PT LONG TERM GOAL #6   Title Patient able to transfer on/off ground and demonstrate yard work and golf swing with prosthesis modified independent to return to leisure activities.  (New Target Date: 08/25//2017)   Baseline 05/12/2016: Pt was unable to independently attain position without cuing and demostration from PT   Time 2   Period Months   Status On-going     PT LONG TERM GOAL #7   Title Patient will increase Functional Gait Assessment 7/30 to >/= 19/30 to indicate decreased risk for falls (New Target Date 07/11/16)   Time 2   Period Months   Status On-going               Plan - 06/09/16 1312    Clinical Impression Statement Session focused on gait training. Pt met both of STG's 6 and 7, suggesting increased independence with community mobility and obstacle negotiation. Pt recently had prosthesis adjusted by orthotist and now feels as though RLE is "too long," per pt. In static standing, level of PSIS's appear equal bilaterally; however, pt exhibits intermittent RLE circumduction, RLE abduction during gait.    Rehab Potential Good   PT Frequency 2x / week   PT Duration Other (comment)  9 weeks (60 days)   PT Treatment/Interventions ADLs/Self Care Home Management;DME Instruction;Gait training;Stair training;Functional mobility training;Therapeutic activities;Therapeutic exercise;Balance training;Neuromuscular re-education;Patient/family education;Prosthetic Training   PT Next Visit Plan Continue gait outside surfaces with cane and gait household without device. Gait Trainer program on treadmill.   Consulted and Agree with Plan of Care Patient      Patient will benefit from skilled therapeutic intervention in order to improve the following deficits and impairments:  Abnormal gait, Decreased activity tolerance, Decreased balance, Decreased endurance, Decreased mobility, Postural dysfunction, Prosthetic  Dependency  Visit Diagnosis: Other abnormalities of gait and mobility  Unsteadiness on feet  Other symptoms and signs involving the musculoskeletal system     Problem List Patient Active Problem List   Diagnosis Date Noted  . Type 2 diabetes with complication ([place]) 32/54/9826  . Phantom pain after amputation of lower extremity ([place]) 01/18/2016  . Neuropathic pain   . Above knee amputation of right lower extremity ([place]) 12/29/2015  . PAD (peripheral artery disease) ([place]) 12/15/2015  . Hyperlipidemia associated with type 2 diabetes mellitus ([place]) 12/07/2015  . Hypertension associated with diabetes ([place]) 12/04/2014    Billie Ruddy, PT, [facility] [address] [place], [place], [address] Phone: [phone]   Fax:  [phone] 06/09/16, 1:16 PM  Name: Adam [name] MRN: [id number] Date of Birth: 12-18-1950

## 2016-06-11 ENCOUNTER — Ambulatory Visit: Payer: Medicare Other | Admitting: Physical Therapy

## 2016-06-11 DIAGNOSIS — R2689 Other abnormalities of gait and mobility: Secondary | ICD-10-CM | POA: Diagnosis not present

## 2016-06-11 DIAGNOSIS — R29898 Other symptoms and signs involving the musculoskeletal system: Secondary | ICD-10-CM

## 2016-06-11 DIAGNOSIS — Z89511 Acquired absence of right leg below knee: Secondary | ICD-10-CM | POA: Diagnosis not present

## 2016-06-11 DIAGNOSIS — R2681 Unsteadiness on feet: Secondary | ICD-10-CM

## 2016-06-11 NOTE — Therapy (Signed)
[facility] [address] [place], [place], [address] Phone: [phone]   Fax:  [phone]  Physical Therapy Treatment  Patient Details  Name: Adam [name] MRN: [id number] Date of Birth: 20-May-1951 Referring Provider: [name], MD  Encounter Date: 06/11/2016      PT End of Session - 06/11/16 1128    Visit Number 23   Number of Visits 31   Date for PT Re-Evaluation 07/11/16   Authorization Type Medicare G-code & progress note   PT Start Time 1026   PT Stop Time 1106   PT Time Calculation (min) 40 min   Equipment Utilized During Treatment Gait belt   Activity Tolerance Patient tolerated treatment well;No increased pain   Behavior During Therapy WFL for tasks assessed/performed      Past Medical History:  Diagnosis Date  . Gangrene ([place]) 11/22/2015   right great toe  . Hypertension   . OSA on CPAP   . PAD (peripheral artery disease) ([place])   . Peripheral vascular disease ([place])   . Type II diabetes mellitus ([place]) dx'd 11/2015    Past Surgical History:  Procedure Laterality Date  . AMPUTATION Right 12/01/2015   Procedure: AMPUTATION RIGHT GREAT;  Surgeon: Conrad Garfield, MD;  Location: [facility];  Service: Vascular;  Laterality: Right;  . AMPUTATION Right 12/16/2015   Procedure: AMPUTATION BELOW KNEE;  Surgeon: [name], MD;  Location: [facility];  Service: Vascular;  Laterality: Right;  . AMPUTATION Right 12/26/2015   Procedure: RIGHT ABOVE KNEE AMPUTATION;  Surgeon: [name], MD;  Location: [facility];  Service: Vascular;  Laterality: Right;  . COLONOSCOPY    . FEMORAL-POPLITEAL BYPASS GRAFT Right 11/28/2015   Procedure: RIGHT  FEMORAL-POPLITEAL ARTERY BYPASS GRAFT AND CONSTRUCTION OF MILLER CUFF USING 6 MM X 80 CM PROPATEN  GRAFT ;  Surgeon: Conrad Niotaze, MD;  Location: [facility];  Service: Vascular;  Laterality: Right;  . INGUINAL HERNIA REPAIR Bilateral 2000  . PERIPHERAL VASCULAR CATHETERIZATION N/A 11/26/2015   Procedure: Abdominal Aortogram;  Surgeon: [name], MD;  Location: [facility];  Service: Cardiovascular;  Laterality: N/A;  . VASECTOMY      There were no vitals filed for this visit.      Subjective Assessment - 06/11/16 1030    Subjective Plans to go see the prosthetist today for adjustsments.   Patient is accompained by: Family member   Pertinent History Likes to be called "Adam [name]".     DM, HTN   Limitations Lifting;Standing;Walking   Patient Stated Goals Use prosthesis to play golf, go to [employer] including boating, yardwork                         [facility] Adult PT Treatment/Exercise - 06/11/16 0001      Ambulation/Gait   Ambulation/Gait Yes   Ambulation/Gait Assistance 5: Supervision   Ambulation/Gait Assistance Details Gait Trainer on treadmill- working on correct steplength, step cycle, speed; pt had difficulty with socket internally rotating with swing phase- 39mn, put one 1ply sock to total 12ply.               Ambulation Distance (Feet) --  664m on gait trainer and short distances practising obstacle and step negotiation at various heights and speeds   Assistive device Prosthesis;Straight cane   Gait Pattern Decreased step length - left;Decreased hip/knee flexion - right;Decreased weight shift to right;Decreased stance time - right;Abducted- right;Decreased arm  swing - right;Right circumduction   Ambulation Surface Level;Unlevel;Indoor   Stairs Yes   Stairs Assistance 5: Supervision  with [place] Details (indicate cue type and reason) practised at varying heights and patterns (step up or over)  stepping over barriers- forward and stidestepping with cane   Ramp 5: Supervision   Ramp Details (indicate cue type and reason) with SPC   Curb 5: Supervision   Curb Details (indicate cue type and reason) with cane; demonstrated correct gait pattern.                PT Education - 06/11/16 1127    Education provided  Yes   Education Details Barrier negotiation with [facility]   Person(s) Educated Patient   Methods Explanation;Demonstration;Verbal cues   Comprehension Verbalized understanding;Returned demonstration;Verbal cues required          PT Short Term Goals - 06/09/16 1030      PT SHORT TERM GOAL #1   Title Patient tolerates wear of prosthesis >10hrs total /day without skin issues or limb pain. (Target Date: 04/18/2016)   Baseline Met: 04/16/16   Time 4   Period Weeks   Status Achieved     PT SHORT TERM GOAL #2   Title Patient demonstrates proper donning & verbalizes proper cleaning of prosthesis.  (Target Date: 04/18/2016)   Baseline Met: 04/16/16   Time 4   Period Weeks   Status Achieved     PT SHORT TERM GOAL #3   Title Patient ambulates 200' with crutches & prosthesis safely with cues only for deviations not physical assist.  (Target Date: 04/18/2016)   Baseline Met: 04/16/16   Time 4   Period Weeks   Status Achieved     PT SHORT TERM GOAL #4   Title Patient negotiates ramps, curbs and stairs (1 rail) with crutches with supervision.  (Target Date: 04/18/2016)   Baseline Met: 04/16/16   Status Achieved     PT SHORT TERM GOAL #5   Title Patient able to reach 7" anteriorly, laterally, across midline and pick up object from floor with prosthesis with supervision.  (Target Date: 04/18/2016)   Baseline Pt able to reach anteriorally, laterally and across midline 7" with supervision, picking object up from floor Min A    Time 4   Period Weeks   Status Partially Met     Additional Short Term Goals   Additional Short Term Goals Yes     PT SHORT TERM GOAL #6   Title Pt will be able to ambulate 500' with SPC including grass and unlevel surfaces with supervision (New Target Date: 06/11/2016)   Baseline Met 7/24.   Time 1   Period Months   Status Achieved     PT SHORT TERM GOAL #7   Title Pt will be able to negotiate ramp, curb and stairs with one rail with [facility] with supervision (New Target  Date: 06/11/2016)   Baseline Met 7/24.   Time 1   Status Achieved           PT Long Term Goals - 05/21/16 1230      PT LONG TERM GOAL #1   Title Patient verbalizes & demonstrates understanding of prosthetic care to enable safe use of prosthesis. (Target Date: 05/16/2016)   Baseline Met 05/12/16   Time 8   Period Weeks   Status Achieved     PT LONG TERM GOAL #2   Title Patient tolerates wear of prosthesis >90% of awake hours without  skin issues or limb tenderness to enable function throughout his awake hours.  (Target Date: 05/16/2016)   Baseline Met 05/12/16   Time 8   Period Weeks   Status Achieved     PT LONG TERM GOAL #3   Title Berg Balance Test > 42/56 to indicate lower fall risk.  (New Target Date: 07/11/2016)   Baseline 05/12/2016: Berg Balance Score 29/56   Time 2   Period Months   Status On-going     PT LONG TERM GOAL #4   Title Patient ambulates 800' with LRAD & prosthesis outside including grass, ramps & curbs modified independent for community access.   (New Target Date: 07/11/2016)   Baseline 05/12/2016 Pt was able to ambulate 500' with SPC including grass, ramp and curbs with Min Assist    Time 2   Period Months   Status On-going     PT LONG TERM GOAL #5   Title Patient ambulates around furniture carrrying plate & cup with only prosthesis independently.  (New Target Date: 07/11/2016)   Time 2   Period Months   Status On-going     PT LONG TERM GOAL #6   Title Patient able to transfer on/off ground and demonstrate yard work and golf swing with prosthesis modified independent to return to leisure activities.  (New Target Date: 08/25//2017)   Baseline 05/12/2016: Pt was unable to independently attain position without cuing and demostration from PT   Time 2   Period Months   Status On-going     PT LONG TERM GOAL #7   Title Patient will increase Functional Gait Assessment 7/30 to >/= 19/30 to indicate decreased risk for falls (New Target Date 07/11/16)    Time 2   Period Months   Status On-going               Plan - 06/11/16 1145    Clinical Impression Statement Pt has to go back to prosthetist for adjustments due to prosthesis still rotating internally with gait.  Performs obstacle and step negotiation with SPC at a supervision to ModI level.   Rehab Potential Good   PT Frequency 2x / week   PT Duration Other (comment)  9 weeks (60 days)   PT Treatment/Interventions ADLs/Self Care Home Management;DME Instruction;Gait training;Stair training;Functional mobility training;Therapeutic activities;Therapeutic exercise;Balance training;Neuromuscular re-education;Patient/family education;Prosthetic Training   PT Next Visit Plan Continue gait outside surfaces with cane and gait household without device. Gait Trainer program on treadmill.   Consulted and Agree with Plan of Care Patient      Patient will benefit from skilled therapeutic intervention in order to improve the following deficits and impairments:  Abnormal gait, Decreased activity tolerance, Decreased balance, Decreased endurance, Decreased mobility, Postural dysfunction, Prosthetic Dependency  Visit Diagnosis: Other abnormalities of gait and mobility  Unsteadiness on feet  Other symptoms and signs involving the musculoskeletal system     Problem List Patient Active Problem List   Diagnosis Date Noted  . Type 2 diabetes with complication ([place]) 79/48/0165  . Phantom pain after amputation of lower extremity ([place]) 01/18/2016  . Neuropathic pain   . Above knee amputation of right lower extremity ([place]) 12/29/2015  . PAD (peripheral artery disease) ([place]) 12/15/2015  . Hyperlipidemia associated with type 2 diabetes mellitus ([place]) 12/07/2015  . Hypertension associated with diabetes ([place]) 12/04/2014    Bjorn Loser, PTA  06/11/16, 11:51 AM [facility] [address] [place], [place], [address] Phone: [phone]    Fax:  [place]  Name: JAYVION STEFANSKI MRN: [id number] Date of Birth: [birth year]

## 2016-06-16 ENCOUNTER — Encounter: Payer: Self-pay | Admitting: Physical Therapy

## 2016-06-16 ENCOUNTER — Ambulatory Visit: Payer: Medicare Other | Admitting: Physical Therapy

## 2016-06-16 DIAGNOSIS — R2681 Unsteadiness on feet: Secondary | ICD-10-CM | POA: Diagnosis not present

## 2016-06-16 DIAGNOSIS — Z89511 Acquired absence of right leg below knee: Secondary | ICD-10-CM | POA: Diagnosis not present

## 2016-06-16 DIAGNOSIS — R29898 Other symptoms and signs involving the musculoskeletal system: Secondary | ICD-10-CM

## 2016-06-16 DIAGNOSIS — R2689 Other abnormalities of gait and mobility: Secondary | ICD-10-CM

## 2016-06-16 NOTE — Therapy (Signed)
[facility] [address] [place], [place], [address] Phone: [phone]   Fax:  [phone]  Physical Therapy Treatment  Patient Details  Name: Adam [name] MRN: [id number] Date of Birth: 07-26-1951 Referring Provider: [name], MD  Encounter Date: 06/16/2016      PT End of Session - 06/16/16 1254    Visit Number 24   Number of Visits 31   Date for PT Re-Evaluation 07/11/16   Authorization Type Medicare G-code & progress note   PT Start Time 1020   PT Stop Time 1100   PT Time Calculation (min) 40 min   Equipment Utilized During Treatment Gait belt   Activity Tolerance Patient tolerated treatment well;No increased pain   Behavior During Therapy WFL for tasks assessed/performed      Past Medical History:  Diagnosis Date  . Gangrene ([place]) 11/22/2015   right great toe  . Hypertension   . OSA on CPAP   . PAD (peripheral artery disease) ([place])   . Peripheral vascular disease ([place])   . Type II diabetes mellitus ([place]) dx'd 11/2015    Past Surgical History:  Procedure Laterality Date  . AMPUTATION Right 12/01/2015   Procedure: AMPUTATION RIGHT GREAT;  Surgeon: Conrad Heidelberg, MD;  Location: [facility];  Service: Vascular;  Laterality: Right;  . AMPUTATION Right 12/16/2015   Procedure: AMPUTATION BELOW KNEE;  Surgeon: [name], MD;  Location: [facility];  Service: Vascular;  Laterality: Right;  . AMPUTATION Right 12/26/2015   Procedure: RIGHT ABOVE KNEE AMPUTATION;  Surgeon: [name], MD;  Location: [facility];  Service: Vascular;  Laterality: Right;  . COLONOSCOPY    . FEMORAL-POPLITEAL BYPASS GRAFT Right 11/28/2015   Procedure: RIGHT  FEMORAL-POPLITEAL ARTERY BYPASS GRAFT AND CONSTRUCTION OF MILLER CUFF USING 6 MM X 80 CM PROPATEN  GRAFT ;  Surgeon: Conrad Iona, MD;  Location: [facility];  Service: Vascular;  Laterality: Right;  . INGUINAL HERNIA REPAIR Bilateral 2000  . PERIPHERAL VASCULAR CATHETERIZATION N/A 11/26/2015   Procedure: Abdominal Aortogram;  Surgeon: [name], MD;  Location: [facility];  Service: Cardiovascular;  Laterality: N/A;  . VASECTOMY      There were no vitals filed for this visit.      Subjective Assessment - 06/16/16 1030    Subjective (P)  Plans to go see the prosthetist today for adjustements.   Patient is accompained by: (P)  Family member   Pertinent History (P)  Likes to be called "[name]".     DM, HTN   Limitations (P)  Lifting;Standing;Walking   Patient Stated Goals (P)  Use prosthesis to play golf, go to [employer] including boating, yardwork                         [facility] Adult PT Treatment/Exercise - 06/16/16 0001      Ambulation/Gait   Ambulation/Gait Yes   Ambulation/Gait Assistance 5: Supervision;4: Min guard  Min guard without SPC   Ambulation/Gait Assistance Details Gait trainer: working in heel toe gait pattern and speed, and appropriate LEft wt shifts: left prosthesis tends to internally rotate with higher gait speeds  With cane, cues for aligning prosthesis during stance to inc   Ambulation Distance (Feet) 500 Feet  with cane and mutlitasking (cognitive activity)+ 300 no AD   Assistive device Prosthesis;Straight cane;None   Gait Pattern Decreased step length - left;Decreased hip/knee flexion - right;Decreased weight shift to right;Decreased stance  time - right;Abducted- right;Decreased arm swing - right;Right circumduction   Ambulation Surface Level             Balance Exercises - 06/16/16 1249      Balance Exercises: Standing   Turning Both  without AD, intermittent UE support with [id number] patterns cues for weight shifting to the Left to bend knee joint..           PT Education - 06/16/16 1251    Education provided Yes   Education Details Discussed benefits of cardiovascular exercise on seated bike or stepper. Pt has access to gym.   Person(s) Educated Patient   Methods Explanation   Comprehension  Verbalized understanding          PT Short Term Goals - 06/09/16 1030      PT SHORT TERM GOAL #1   Title Patient tolerates wear of prosthesis >10hrs total /day without skin issues or limb pain. (Target Date: 04/18/2016)   Baseline Met: 04/16/16   Time 4   Period Weeks   Status Achieved     PT SHORT TERM GOAL #2   Title Patient demonstrates proper donning & verbalizes proper cleaning of prosthesis.  (Target Date: 04/18/2016)   Baseline Met: 04/16/16   Time 4   Period Weeks   Status Achieved     PT SHORT TERM GOAL #3   Title Patient ambulates 200' with crutches & prosthesis safely with cues only for deviations not physical assist.  (Target Date: 04/18/2016)   Baseline Met: 04/16/16   Time 4   Period Weeks   Status Achieved     PT SHORT TERM GOAL #4   Title Patient negotiates ramps, curbs and stairs (1 rail) with crutches with supervision.  (Target Date: 04/18/2016)   Baseline Met: 04/16/16   Status Achieved     PT SHORT TERM GOAL #5   Title Patient able to reach 7" anteriorly, laterally, across midline and pick up object from floor with prosthesis with supervision.  (Target Date: 04/18/2016)   Baseline Pt able to reach anteriorally, laterally and across midline 7" with supervision, picking object up from floor Min A    Time 4   Period Weeks   Status Partially Met     Additional Short Term Goals   Additional Short Term Goals Yes     PT SHORT TERM GOAL #6   Title Pt will be able to ambulate 500' with SPC including grass and unlevel surfaces with supervision (New Target Date: 06/11/2016)   Baseline Met 7/24.   Time 1   Period Months   Status Achieved     PT SHORT TERM GOAL #7   Title Pt will be able to negotiate ramp, curb and stairs with one rail with [facility] with supervision (New Target Date: 06/11/2016)   Baseline Met 7/24.   Time 1   Status Achieved           PT Long Term Goals - 05/21/16 1230      PT LONG TERM GOAL #1   Title Patient verbalizes & demonstrates  understanding of prosthetic care to enable safe use of prosthesis. (Target Date: 05/16/2016)   Baseline Met 05/12/16   Time 8   Period Weeks   Status Achieved     PT LONG TERM GOAL #2   Title Patient tolerates wear of prosthesis >90% of awake hours without skin issues or limb tenderness to enable function throughout his awake hours.  (Target Date: 05/16/2016)   Baseline Met 05/12/16  Time 8   Period Weeks   Status Achieved     PT LONG TERM GOAL #3   Title Berg Balance Test > 42/56 to indicate lower fall risk.  (New Target Date: 07/11/2016)   Baseline 05/12/2016: Berg Balance Score 29/56   Time 2   Period Months   Status On-going     PT LONG TERM GOAL #4   Title Patient ambulates 800' with LRAD & prosthesis outside including grass, ramps & curbs modified independent for community access.   (New Target Date: 07/11/2016)   Baseline 05/12/2016 Pt was able to ambulate 500' with SPC including grass, ramp and curbs with Min Assist    Time 2   Period Months   Status On-going     PT LONG TERM GOAL #5   Title Patient ambulates around furniture carrrying plate & cup with only prosthesis independently.  (New Target Date: 07/11/2016)   Time 2   Period Months   Status On-going     PT LONG TERM GOAL #6   Title Patient able to transfer on/off ground and demonstrate yard work and golf swing with prosthesis modified independent to return to leisure activities.  (New Target Date: 08/25//2017)   Baseline 05/12/2016: Pt was unable to independently attain position without cuing and demostration from PT   Time 2   Period Months   Status On-going     PT LONG TERM GOAL #7   Title Patient will increase Functional Gait Assessment 7/30 to >/= 19/30 to indicate decreased risk for falls (New Target Date 07/11/16)   Time 2   Period Months   Status On-going               Plan - 06/16/16 1255    Clinical Impression Statement Prosthesis continues to internally rotate at higher gait speeds.  Pt  continues to requires intermittent asisst with sharp turns for obstacle negotiation during gait without an AD   Rehab Potential Good   PT Frequency 2x / week   PT Duration Other (comment)  9 weeks (60 days)   PT Treatment/Interventions ADLs/Self Care Home Management;DME Instruction;Gait training;Stair training;Functional mobility training;Therapeutic activities;Therapeutic exercise;Balance training;Neuromuscular re-education;Patient/family education;Prosthetic Training   PT Next Visit Plan Continue gait outside surfaces with cane and gait household without device. Gait Trainer program on treadmill.   Consulted and Agree with Plan of Care Patient      Patient will benefit from skilled therapeutic intervention in order to improve the following deficits and impairments:  Abnormal gait, Decreased activity tolerance, Decreased balance, Decreased endurance, Decreased mobility, Postural dysfunction, Prosthetic Dependency  Visit Diagnosis: Other abnormalities of gait and mobility  Unsteadiness on feet  Other symptoms and signs involving the musculoskeletal system     Problem List Patient Active Problem List   Diagnosis Date Noted  . Type 2 diabetes with complication ([place]) 26/41/5830  . Phantom pain after amputation of lower extremity ([place]) 01/18/2016  . Neuropathic pain   . Above knee amputation of right lower extremity ([place]) 12/29/2015  . PAD (peripheral artery disease) ([place]) 12/15/2015  . Hyperlipidemia associated with type 2 diabetes mellitus ([place]) 12/07/2015  . Hypertension associated with diabetes ([place]) 12/04/2014    Bjorn Loser, PTA  06/16/16, 12:59 PM [facility] [address] [place], [place], [address] Phone: [phone]   Fax:  [phone]  Name: Adam [name] MRN: [id number] Date of Birth: Mar 28, 1951

## 2016-06-18 ENCOUNTER — Encounter: Payer: Self-pay | Admitting: Physical Therapy

## 2016-06-18 ENCOUNTER — Ambulatory Visit: Payer: Medicare Other | Attending: Vascular Surgery | Admitting: Physical Therapy

## 2016-06-18 DIAGNOSIS — R2689 Other abnormalities of gait and mobility: Secondary | ICD-10-CM | POA: Diagnosis not present

## 2016-06-18 DIAGNOSIS — R2681 Unsteadiness on feet: Secondary | ICD-10-CM

## 2016-06-18 DIAGNOSIS — R29898 Other symptoms and signs involving the musculoskeletal system: Secondary | ICD-10-CM | POA: Diagnosis not present

## 2016-06-18 NOTE — Therapy (Signed)
[facility] [address] [place], [place], [address] Phone: [phone]   Fax:  [phone]  Physical Therapy Treatment  Patient Details  Name: Adam [name] MRN: [id number] Date of Birth: [birth year] Referring Provider: [name], MD  Encounter Date: 06/18/2016      PT End of Session - 06/18/16 2209    Visit Number 25   Number of Visits 31   Date for PT Re-Evaluation 07/11/16   Authorization Type Medicare G-code & progress note   PT Start Time 1018   PT Stop Time 1100   PT Time Calculation (min) 42 min   Equipment Utilized During Treatment Gait belt   Activity Tolerance Patient tolerated treatment well;No increased pain   Behavior During Therapy WFL for tasks assessed/performed      Past Medical History:  Diagnosis Date  . Gangrene ([place]) 11/22/2015   right great toe  . Hypertension   . OSA on CPAP   . PAD (peripheral artery disease) ([place])   . Peripheral vascular disease ([place])   . Type II diabetes mellitus ([place]) dx'd 11/2015    Past Surgical History:  Procedure Laterality Date  . AMPUTATION Right 12/01/2015   Procedure: AMPUTATION RIGHT GREAT;  Surgeon: Conrad [place]ville, MD;  Location: [facility];  Service: Vascular;  Laterality: Right;  . AMPUTATION Right 12/16/2015   Procedure: AMPUTATION BELOW KNEE;  Surgeon: [name], MD;  Location: [facility];  Service: Vascular;  Laterality: Right;  . AMPUTATION Right 12/26/2015   Procedure: RIGHT ABOVE KNEE AMPUTATION;  Surgeon: [name], MD;  Location: [facility];  Service: Vascular;  Laterality: Right;  . COLONOSCOPY    . FEMORAL-POPLITEAL BYPASS GRAFT Right 11/28/2015   Procedure: RIGHT  FEMORAL-POPLITEAL ARTERY BYPASS GRAFT AND CONSTRUCTION OF MILLER CUFF USING 6 MM X 80 CM PROPATEN  GRAFT ;  Surgeon: Conrad Delta, MD;  Location: [facility];  Service: Vascular;  Laterality: Right;  . INGUINAL HERNIA REPAIR Bilateral 2000  . PERIPHERAL VASCULAR CATHETERIZATION N/A 11/26/2015    Procedure: Abdominal Aortogram;  Surgeon: [name], MD;  Location: [facility];  Service: Cardiovascular;  Laterality: N/A;  . VASECTOMY      There were no vitals filed for this visit.      Subjective Assessment - 06/18/16 1015    Subjective He saw prosthetist who added pads to socket and it feels too long now.   Patient is accompained by: Family member   Pertinent History Likes to be called "[name]".     DM, HTN   Limitations Lifting;Standing;Walking   Patient Stated Goals Use prosthesis to play golf, go to beach house including boating, yardwork   Currently in Pain? No/denies                         [facility] Adult PT Treatment/Exercise - 06/18/16 1015      Transfers   Transfers Sit to Stand;Stand to Sit   Sit to Stand 5: Supervision;With upper extremity assist;With armrests;From chair/3-in-1   Sit to Stand Details (indicate cue type and reason) engaging prosthetic knee   Stand to Sit 5: Supervision;With upper extremity assist;With armrests;To chair/3-in-1   Stand to Sit Details cues on foot position to unlock knee     Ambulation/Gait   Ambulation/Gait Yes   Ambulation/Gait Assistance 5: Supervision;4: Min guard  Min guard without SPC   Ambulation/Gait Assistance Details used wall contact & visual reference to decrease abduction with weight  shift over prosthesis in stance. Tactile, visual, proprioceptive cues for arm swing. Progressed to 4-pt gait with walking sticks.   Ambulation Distance (Feet) 500 Feet  500' X 3   Assistive device Prosthesis;None  Walking Sticks   Gait Pattern Decreased step length - left;Decreased hip/knee flexion - right;Decreased weight shift to right;Decreased stance time - right;Abducted- right;Decreased arm swing - right;Right circumduction   Ambulation Surface Indoor;Level     High Level Balance   High Level Balance Activities Negotitating around obstacles  Walking sticks & prosthesis   High Level Balance Comments  fluent motion around 8 cones 4' apart     Neuro Re-ed    Neuro Re-ed Details  standing with upright trunk, reciprocal UE forward & backward swing with 10# pulley weight                  PT Short Term Goals - 06/09/16 1030      PT SHORT TERM GOAL #1   Title Patient tolerates wear of prosthesis >10hrs total /day without skin issues or limb pain. (Target Date: 04/18/2016)   Baseline Met: 04/16/16   Time 4   Period Weeks   Status Achieved     PT SHORT TERM GOAL #2   Title Patient demonstrates proper donning & verbalizes proper cleaning of prosthesis.  (Target Date: 04/18/2016)   Baseline Met: 04/16/16   Time 4   Period Weeks   Status Achieved     PT SHORT TERM GOAL #3   Title Patient ambulates 200' with crutches & prosthesis safely with cues only for deviations not physical assist.  (Target Date: 04/18/2016)   Baseline Met: 04/16/16   Time 4   Period Weeks   Status Achieved     PT SHORT TERM GOAL #4   Title Patient negotiates ramps, curbs and stairs (1 rail) with crutches with supervision.  (Target Date: 04/18/2016)   Baseline Met: 04/16/16   Status Achieved     PT SHORT TERM GOAL #5   Title Patient able to reach 7" anteriorly, laterally, across midline and pick up object from floor with prosthesis with supervision.  (Target Date: 04/18/2016)   Baseline Pt able to reach anteriorally, laterally and across midline 7" with supervision, picking object up from floor Min A    Time 4   Period Weeks   Status Partially Met     Additional Short Term Goals   Additional Short Term Goals Yes     PT SHORT TERM GOAL #6   Title Pt will be able to ambulate 500' with SPC including grass and unlevel surfaces with supervision (New Target Date: 06/11/2016)   Baseline Met 7/24.   Time 1   Period Months   Status Achieved     PT SHORT TERM GOAL #7   Title Pt will be able to negotiate ramp, curb and stairs with one rail with [facility] with supervision (New Target Date: 06/11/2016)   Baseline Met  7/24.   Time 1   Status Achieved           PT Long Term Goals - 05/21/16 1230      PT LONG TERM GOAL #1   Title Patient verbalizes & demonstrates understanding of prosthetic care to enable safe use of prosthesis. (Target Date: 05/16/2016)   Baseline Met 05/12/16   Time 8   Period Weeks   Status Achieved     PT LONG TERM GOAL #2   Title Patient tolerates wear of prosthesis >90% of awake hours without  skin issues or limb tenderness to enable function throughout his awake hours.  (Target Date: 05/16/2016)   Baseline Met 05/12/16   Time 8   Period Weeks   Status Achieved     PT LONG TERM GOAL #3   Title Berg Balance Test > 42/56 to indicate lower fall risk.  (New Target Date: 07/11/2016)   Baseline 05/12/2016: Berg Balance Score 29/56   Time 2   Period Months   Status On-going     PT LONG TERM GOAL #4   Title Patient ambulates 800' with LRAD & prosthesis outside including grass, ramps & curbs modified independent for community access.   (New Target Date: 07/11/2016)   Baseline 05/12/2016 Pt was able to ambulate 500' with SPC including grass, ramp and curbs with Min Assist    Time 2   Period Months   Status On-going     PT LONG TERM GOAL #5   Title Patient ambulates around furniture carrrying plate & cup with only prosthesis independently.  (New Target Date: 07/11/2016)   Time 2   Period Months   Status On-going     PT LONG TERM GOAL #6   Title Patient able to transfer on/off ground and demonstrate yard work and golf swing with prosthesis modified independent to return to leisure activities.  (New Target Date: 08/25//2017)   Baseline 05/12/2016: Pt was unable to independently attain position without cuing and demostration from PT   Time 2   Period Months   Status On-going     PT LONG TERM GOAL #7   Title Patient will increase Functional Gait Assessment 7/30 to >/= 19/30 to indicate decreased risk for falls (New Target Date 07/11/16)   Time 2   Period Months   Status  On-going               Plan - 06/18/16 2209    Clinical Impression Statement Patient's gait improved with skilled instruction to decrease abduction and increase arm swing.    Rehab Potential Good   PT Frequency 2x / week   PT Duration Other (comment)  9 weeks (60 days)   PT Treatment/Interventions ADLs/Self Care Home Management;DME Instruction;Gait training;Stair training;Functional mobility training;Therapeutic activities;Therapeutic exercise;Balance training;Neuromuscular re-education;Patient/family education;Prosthetic Training   PT Next Visit Plan Continue gait outside surfaces with cane and gait household without device. Gait Trainer program on treadmill.   Consulted and Agree with Plan of Care Patient      Patient will benefit from skilled therapeutic intervention in order to improve the following deficits and impairments:  Abnormal gait, Decreased activity tolerance, Decreased balance, Decreased endurance, Decreased mobility, Postural dysfunction, Prosthetic Dependency  Visit Diagnosis: Other abnormalities of gait and mobility  Unsteadiness on feet  Other symptoms and signs involving the musculoskeletal system     Problem List Patient Active Problem List   Diagnosis Date Noted  . Type 2 diabetes with complication ([place]) 61/53/7943  . Phantom pain after amputation of lower extremity ([place]) 01/18/2016  . Neuropathic pain   . Above knee amputation of right lower extremity ([place]) 12/29/2015  . PAD (peripheral artery disease) ([place]) 12/15/2015  . Hyperlipidemia associated with type 2 diabetes mellitus ([place]) 12/07/2015  . Hypertension associated with diabetes ([place]) 12/04/2014    Krystle Polcyn PT, DPT 06/18/2016, 10:11 PM  [facility] [address] [place], [place], [address] Phone: [phone]   Fax:  [phone]  Name: Adam [name] MRN: [id number] Date of Birth: [birth year]

## 2016-06-22 ENCOUNTER — Other Ambulatory Visit: Payer: Self-pay | Admitting: Family Medicine

## 2016-06-23 ENCOUNTER — Ambulatory Visit: Payer: Medicare Other | Admitting: Physical Therapy

## 2016-06-23 ENCOUNTER — Encounter: Payer: Self-pay | Admitting: Physical Therapy

## 2016-06-23 DIAGNOSIS — R2689 Other abnormalities of gait and mobility: Secondary | ICD-10-CM

## 2016-06-23 DIAGNOSIS — R2681 Unsteadiness on feet: Secondary | ICD-10-CM

## 2016-06-23 DIAGNOSIS — R29898 Other symptoms and signs involving the musculoskeletal system: Secondary | ICD-10-CM

## 2016-06-23 NOTE — Telephone Encounter (Signed)
Is this okay to refill? 

## 2016-06-24 NOTE — Therapy (Signed)
[facility] [address] [place], [place], [address] Phone: [phone]   Fax:  [phone]  Physical Therapy Treatment  Patient Details  Name: Adam [name] MRN: [id number] Date of Birth: [birth year] Referring Provider: [name], MD  Encounter Date: 06/23/2016      PT End of Session - 06/23/16 1530    Visit Number 26   Number of Visits 31   Date for PT Re-Evaluation 07/11/16   Authorization Type Medicare G-code & progress note   PT Start Time 1150   PT Stop Time 1230   PT Time Calculation (min) 40 min   Equipment Utilized During Treatment Gait belt   Activity Tolerance Patient tolerated treatment well;No increased pain   Behavior During Therapy WFL for tasks assessed/performed      Past Medical History:  Diagnosis Date  . Gangrene ([place]) 11/22/2015   right great toe  . Hypertension   . OSA on CPAP   . PAD (peripheral artery disease) ([place])   . Peripheral vascular disease ([place])   . Type II diabetes mellitus ([place]) dx'd 11/2015    Past Surgical History:  Procedure Laterality Date  . AMPUTATION Right 12/01/2015   Procedure: AMPUTATION RIGHT GREAT;  Surgeon: Conrad Yutan, MD;  Location: [facility];  Service: Vascular;  Laterality: Right;  . AMPUTATION Right 12/16/2015   Procedure: AMPUTATION BELOW KNEE;  Surgeon: [name], MD;  Location: [facility];  Service: Vascular;  Laterality: Right;  . AMPUTATION Right 12/26/2015   Procedure: RIGHT ABOVE KNEE AMPUTATION;  Surgeon: [name], MD;  Location: [facility];  Service: Vascular;  Laterality: Right;  . COLONOSCOPY    . FEMORAL-POPLITEAL BYPASS GRAFT Right 11/28/2015   Procedure: RIGHT  FEMORAL-POPLITEAL ARTERY BYPASS GRAFT AND CONSTRUCTION OF MILLER CUFF USING 6 MM X 80 CM PROPATEN  GRAFT ;  Surgeon: Conrad [place], MD;  Location: [facility];  Service: Vascular;  Laterality: Right;  . INGUINAL HERNIA REPAIR Bilateral 2000  . PERIPHERAL VASCULAR CATHETERIZATION N/A 11/26/2015   Procedure: Abdominal Aortogram;  Surgeon: [name], MD;  Location: [facility];  Service: Cardiovascular;  Laterality: N/A;  . VASECTOMY      There were no vitals filed for this visit.      Subjective Assessment - 06/23/16 1153    Subjective He bought walking sticks and has been working with him. He feels the number of socks he is wearing is causing prosthesis to twist & issues. It's slowing his progress.    Patient is accompained by: Family member   Pertinent History Likes to be called "[name]".     DM, HTN   Limitations Lifting;Standing;Walking   Patient Stated Goals Use prosthesis to play golf, go to beach house including boating, yardwork   Currently in Pain? No/denies                         [facility] Adult PT Treatment/Exercise - 06/23/16 1145      Transfers   Transfers Sit to Stand;Stand to Sit   Sit to Stand 5: Supervision;With upper extremity assist;With armrests;From chair/3-in-1   Stand to Sit 5: Supervision;With upper extremity assist;With armrests;To chair/3-in-1     Ambulation/Gait   Ambulation/Gait Yes   Ambulation/Gait Assistance 5: Supervision;4: Min guard  Min guard with head turns & direction changes no device   Ambulation/Gait Assistance Details Walking Sticks with 2-pt pattern with occassional verbal cues on sequence and unlocking prosthesis in terminal  stance, worked on negotiating around obstacle 5' apart. Gait with prostheis only working on head turns maintaining path & pace and dirction changes; worked on walking while rolling balls in RUE & then tossing cloth from right to left with tactile & verbal cues.    Ambulation Distance (Feet) 700 Feet  700' X 3   Assistive device Prosthesis;None  Walking Sticks   Gait Pattern Decreased step length - left;Decreased hip/knee flexion - right;Decreased weight shift to right;Decreased stance time - right;Abducted- right;Decreased arm swing - right;Right circumduction   Ambulation Surface  Indoor;Level   Stairs Assistance 5: Supervision  pt used rail intermittently for balance   Stairs Assistance Details (indicate cue type and reason) demo & verbal cues on technique for stairs with no rail available   Stair Management Technique No rails;Step to pattern;Forwards   Number of Stairs 4  6 reps     High Level Balance   High Level Balance Activities Negotitating around obstacles;Side stepping;Backward walking;Direction changes;Other (comment)  performing tasks with RUE while ambulating   High Level Balance Comments fluent motion around 8 cones 5' apart     Neuro Re-ed    Neuro Re-ed Details  --                  PT Short Term Goals - 06/09/16 1030      PT SHORT TERM GOAL #1   Title Patient tolerates wear of prosthesis >10hrs total /day without skin issues or limb pain. (Target Date: 04/18/2016)   Baseline Met: 04/16/16   Time 4   Period Weeks   Status Achieved     PT SHORT TERM GOAL #2   Title Patient demonstrates proper donning & verbalizes proper cleaning of prosthesis.  (Target Date: 04/18/2016)   Baseline Met: 04/16/16   Time 4   Period Weeks   Status Achieved     PT SHORT TERM GOAL #3   Title Patient ambulates 200' with crutches & prosthesis safely with cues only for deviations not physical assist.  (Target Date: 04/18/2016)   Baseline Met: 04/16/16   Time 4   Period Weeks   Status Achieved     PT SHORT TERM GOAL #4   Title Patient negotiates ramps, curbs and stairs (1 rail) with crutches with supervision.  (Target Date: 04/18/2016)   Baseline Met: 04/16/16   Status Achieved     PT SHORT TERM GOAL #5   Title Patient able to reach 7" anteriorly, laterally, across midline and pick up object from floor with prosthesis with supervision.  (Target Date: 04/18/2016)   Baseline Pt able to reach anteriorally, laterally and across midline 7" with supervision, picking object up from floor Min A    Time 4   Period Weeks   Status Partially Met     Additional Short  Term Goals   Additional Short Term Goals Yes     PT SHORT TERM GOAL #6   Title Pt will be able to ambulate 500' with SPC including grass and unlevel surfaces with supervision (New Target Date: 06/11/2016)   Baseline Met 7/24.   Time 1   Period Months   Status Achieved     PT SHORT TERM GOAL #7   Title Pt will be able to negotiate ramp, curb and stairs with one rail with [facility] with supervision (New Target Date: 06/11/2016)   Baseline Met 7/24.   Time 1   Status Achieved           PT Long Term Goals -  05/21/16 1230      PT LONG TERM GOAL #1   Title Patient verbalizes & demonstrates understanding of prosthetic care to enable safe use of prosthesis. (Target Date: 05/16/2016)   Baseline Met 05/12/16   Time 8   Period Weeks   Status Achieved     PT LONG TERM GOAL #2   Title Patient tolerates wear of prosthesis >90% of awake hours without skin issues or limb tenderness to enable function throughout his awake hours.  (Target Date: 05/16/2016)   Baseline Met 05/12/16   Time 8   Period Weeks   Status Achieved     PT LONG TERM GOAL #3   Title Berg Balance Test > 42/56 to indicate lower fall risk.  (New Target Date: 07/11/2016)   Baseline 05/12/2016: Berg Balance Score 29/56   Time 2   Period Months   Status On-going     PT LONG TERM GOAL #4   Title Patient ambulates 800' with LRAD & prosthesis outside including grass, ramps & curbs modified independent for community access.   (New Target Date: 07/11/2016)   Baseline 05/12/2016 Pt was able to ambulate 500' with SPC including grass, ramp and curbs with Min Assist    Time 2   Period Months   Status On-going     PT LONG TERM GOAL #5   Title Patient ambulates around furniture carrrying plate & cup with only prosthesis independently.  (New Target Date: 07/11/2016)   Time 2   Period Months   Status On-going     PT LONG TERM GOAL #6   Title Patient able to transfer on/off ground and demonstrate yard work and golf swing with  prosthesis modified independent to return to leisure activities.  (New Target Date: 08/25//2017)   Baseline 05/12/2016: Pt was unable to independently attain position without cuing and demostration from PT   Time 2   Period Months   Status On-going     PT LONG TERM GOAL #7   Title Patient will increase Functional Gait Assessment 7/30 to >/= 19/30 to indicate decreased risk for falls (New Target Date 07/11/16)   Time 2   Period Months   Status On-going               Plan - 06/23/16 1530    Clinical Impression Statement Patient was challenged by multi-tasking of maneuvering Chinese balls in RUE or tossing cloth between hands while ambulating. He improved ability to maintain path & pace with tactile & verbal cues for instruction. Patient is on target to meet LTGs.    Rehab Potential Good   PT Frequency 2x / week   PT Duration Other (comment)  9 weeks (60 days)   PT Treatment/Interventions ADLs/Self Care Home Management;DME Instruction;Gait training;Stair training;Functional mobility training;Therapeutic activities;Therapeutic exercise;Balance training;Neuromuscular re-education;Patient/family education;Prosthetic Training   PT Next Visit Plan Continue gait outside surfaces and gait household with prosthesis only   Consulted and Agree with Plan of Care Patient      Patient will benefit from skilled therapeutic intervention in order to improve the following deficits and impairments:  Abnormal gait, Decreased activity tolerance, Decreased balance, Decreased endurance, Decreased mobility, Postural dysfunction, Prosthetic Dependency  Visit Diagnosis: Other abnormalities of gait and mobility  Unsteadiness on feet  Other symptoms and signs involving the musculoskeletal system     Problem List Patient Active Problem List   Diagnosis Date Noted  . Type 2 diabetes with complication ([place]) 38/25/0539  . Phantom pain after amputation of lower extremity ([place])  01/18/2016  .  Neuropathic pain   . Above knee amputation of right lower extremity ([place]) 12/29/2015  . PAD (peripheral artery disease) ([facility]) 12/15/2015  . Hyperlipidemia associated with type 2 diabetes mellitus ([place]) 12/07/2015  . Hypertension associated with diabetes ([place]) 12/04/2014    Ryllie Nieland PT, DPT 06/24/2016, 7:19 AM  [facility] [address] [place], [place], [address] Phone: [phone]   Fax:  [phone]  Name: Adam [name] MRN: [id number] Date of Birth: 09-14-1951

## 2016-06-25 ENCOUNTER — Encounter: Payer: Self-pay | Admitting: Physical Therapy

## 2016-06-25 ENCOUNTER — Ambulatory Visit: Payer: Medicare Other | Admitting: Physical Therapy

## 2016-06-25 DIAGNOSIS — R2689 Other abnormalities of gait and mobility: Secondary | ICD-10-CM

## 2016-06-25 DIAGNOSIS — R29898 Other symptoms and signs involving the musculoskeletal system: Secondary | ICD-10-CM

## 2016-06-25 DIAGNOSIS — R2681 Unsteadiness on feet: Secondary | ICD-10-CM

## 2016-06-29 NOTE — Therapy (Signed)
[facility] [address] [place], [place], [address] Phone: [phone]   Fax:  [phone]  Physical Therapy Treatment  Patient Details  Name: Adam [name] MRN: [id number] Date of Birth: Feb 26, 1951 Referring Provider: [name], MD  Encounter Date: 06/25/2016     06/25/16 1024  PT Visits / Re-Eval  Visit Number 27  Number of Visits 31  Date for PT Re-Evaluation 07/11/16  Authorization  Authorization Type Medicare G-code & progress note  PT Time Calculation  PT Start Time 1020  PT Stop Time 1100  PT Time Calculation (min) 40 min  PT - End of Session  Equipment Utilized During Treatment Gait belt  Activity Tolerance Patient tolerated treatment well;No increased pain  Behavior During Therapy WFL for tasks assessed/performed    Past Medical History:  Diagnosis Date  . Gangrene ([facility]) 11/22/2015   right great toe  . Hypertension   . OSA on CPAP   . PAD (peripheral artery disease) ([place])   . Peripheral vascular disease ([place])   . Type II diabetes mellitus ([facility]) dx'd 11/2015    Past Surgical History:  Procedure Laterality Date  . AMPUTATION Right 12/01/2015   Procedure: AMPUTATION RIGHT GREAT;  Surgeon: Conrad Robbins, MD;  Location: [facility];  Service: Vascular;  Laterality: Right;  . AMPUTATION Right 12/16/2015   Procedure: AMPUTATION BELOW KNEE;  Surgeon: [name], MD;  Location: [facility];  Service: Vascular;  Laterality: Right;  . AMPUTATION Right 12/26/2015   Procedure: RIGHT ABOVE KNEE AMPUTATION;  Surgeon: [name], MD;  Location: [facility];  Service: Vascular;  Laterality: Right;  . COLONOSCOPY    . FEMORAL-POPLITEAL BYPASS GRAFT Right 11/28/2015   Procedure: RIGHT  FEMORAL-POPLITEAL ARTERY BYPASS GRAFT AND CONSTRUCTION OF MILLER CUFF USING 6 MM X 80 CM PROPATEN  GRAFT ;  Surgeon: Conrad [place], MD;  Location: [facility];  Service: Vascular;  Laterality: Right;  . INGUINAL HERNIA REPAIR Bilateral 2000  . PERIPHERAL  VASCULAR CATHETERIZATION N/A 11/26/2015   Procedure: Abdominal Aortogram;  Surgeon: [name], MD;  Location: [facility];  Service: Cardiovascular;  Laterality: N/A;  . VASECTOMY      There were no vitals filed for this visit.     06/25/16 1023  Symptoms/Limitations  Subjective No new complaints. No falls or pain to report.   Patient is accompained by: Family member  Pertinent History Likes to be called "[name]".     DM, HTN  Limitations Lifting;Standing;Walking  Patient Stated Goals Use prosthesis to play golf, go to beach house including boating, yardwork  Pain Assessment  Currently in Pain? No/denies      06/25/16 1025  Transfers  Transfers Sit to Stand;Stand to Sit  Sit to Stand 5: Supervision;With upper extremity assist;With armrests;From chair/3-in-1  Stand to Sit 5: Supervision;With upper extremity assist;With armrests;To chair/3-in-1  Ambulation/Gait  Ambulation/Gait Yes  Ambulation/Gait Assistance 5: Supervision;4: Min guard;4: Min assist  Ambulation/Gait Assistance Details occasional cues needed on sequencing with walking sticks. gait with prosthesis only: while rolling stress balls in right hand, x 230 feet, tossing hankerchief x 200 feet, all with cues to maintain cadence while multi tasking.   Assistive device Prosthesis;None  Gait Pattern Decreased step length - left;Decreased hip/knee flexion - right;Decreased weight shift to right;Decreased stance time - right;Abducted- right;Decreased arm swing - right;Right circumduction  Ambulation Surface Level;Indoor  Gait Comments along 50 foot hallway: gait with head movements up<>down, left<>right x 2 laps each forward with  min guard to occaional min assist needed for balance, decreased gait speed noted with head turns.   Neuro Re-ed   Neuro Re-ed Details  golf putting on various surfaces: on carpet x 10 balls with pt having to reposition himselft to balls, on blue mat over level surfaces x 10 balls with pt  moving to position to ball and on blue mat over ramp both up and down directions x 5 balls each way. min guard assist once positioned on blue mats, min assist to get onto mat and balance to position.   Prosthetics  Current prosthetic wear tolerance (days/week)  daily  Current prosthetic wear tolerance (#hours/day)  all awake hours  Residual limb condition  reports no issues  Donning Prosthesis 6  Doffing Prosthesis 6           PT Short Term Goals - 06/09/16 1030      PT SHORT TERM GOAL #1   Title Patient tolerates wear of prosthesis >10hrs total /day without skin issues or limb pain. (Target Date: 04/18/2016)   Baseline Met: 04/16/16   Time 4   Period Weeks   Status Achieved     PT SHORT TERM GOAL #2   Title Patient demonstrates proper donning & verbalizes proper cleaning of prosthesis.  (Target Date: 04/18/2016)   Baseline Met: 04/16/16   Time 4   Period Weeks   Status Achieved     PT SHORT TERM GOAL #3   Title Patient ambulates 200' with crutches & prosthesis safely with cues only for deviations not physical assist.  (Target Date: 04/18/2016)   Baseline Met: 04/16/16   Time 4   Period Weeks   Status Achieved     PT SHORT TERM GOAL #4   Title Patient negotiates ramps, curbs and stairs (1 rail) with crutches with supervision.  (Target Date: 04/18/2016)   Baseline Met: 04/16/16   Status Achieved     PT SHORT TERM GOAL #5   Title Patient able to reach 7" anteriorly, laterally, across midline and pick up object from floor with prosthesis with supervision.  (Target Date: 04/18/2016)   Baseline Pt able to reach anteriorally, laterally and across midline 7" with supervision, picking object up from floor Min A    Time 4   Period Weeks   Status Partially Met     Additional Short Term Goals   Additional Short Term Goals Yes     PT SHORT TERM GOAL #6   Title Pt will be able to ambulate 500' with SPC including grass and unlevel surfaces with supervision (New Target Date: 06/11/2016)    Baseline Met 7/24.   Time 1   Period Months   Status Achieved     PT SHORT TERM GOAL #7   Title Pt will be able to negotiate ramp, curb and stairs with one rail with [facility] with supervision (New Target Date: 06/11/2016)   Baseline Met 7/24.   Time 1   Status Achieved           PT Long Term Goals - 05/21/16 1230      PT LONG TERM GOAL #1   Title Patient verbalizes & demonstrates understanding of prosthetic care to enable safe use of prosthesis. (Target Date: 05/16/2016)   Baseline Met 05/12/16   Time 8   Period Weeks   Status Achieved     PT LONG TERM GOAL #2   Title Patient tolerates wear of prosthesis >90% of awake hours without skin issues or limb tenderness to enable  function throughout his awake hours.  (Target Date: 05/16/2016)   Baseline Met 05/12/16   Time 8   Period Weeks   Status Achieved     PT LONG TERM GOAL #3   Title Berg Balance Test > 42/56 to indicate lower fall risk.  (New Target Date: 07/11/2016)   Baseline 05/12/2016: Berg Balance Score 29/56   Time 2   Period Months   Status On-going     PT LONG TERM GOAL #4   Title Patient ambulates 800' with LRAD & prosthesis outside including grass, ramps & curbs modified independent for community access.   (New Target Date: 07/11/2016)   Baseline 05/12/2016 Pt was able to ambulate 500' with SPC including grass, ramp and curbs with Min Assist    Time 2   Period Months   Status On-going     PT LONG TERM GOAL #5   Title Patient ambulates around furniture carrrying plate & cup with only prosthesis independently.  (New Target Date: 07/11/2016)   Time 2   Period Months   Status On-going     PT LONG TERM GOAL #6   Title Patient able to transfer on/off ground and demonstrate yard work and golf swing with prosthesis modified independent to return to leisure activities.  (New Target Date: 08/25//2017)   Baseline 05/12/2016: Pt was unable to independently attain position without cuing and demostration from PT   Time 2    Period Months   Status On-going     PT LONG TERM GOAL #7   Title Patient will increase Functional Gait Assessment 7/30 to >/= 19/30 to indicate decreased risk for falls (New Target Date 07/11/16)   Time 2   Period Months   Status On-going        06/25/16 1024  Plan  Clinical Impression Statement Today's session continued to work on gait with mult-tasking and on balance with functional activities such as golf. No issues reported. Pt is making steady progress toward goals. Pt in on track to meet goals at end of plan of care.  Pt will benefit from skilled therapeutic intervention in order to improve on the following deficits Abnormal gait;Decreased activity tolerance;Decreased balance;Decreased endurance;Decreased mobility;Postural dysfunction;Prosthetic Dependency  Rehab Potential Good  PT Frequency 2x / week  PT Duration Other (comment) (9 weeks (60 days))  PT Treatment/Interventions ADLs/Self Care Home Management;DME Instruction;Gait training;Stair training;Functional mobility training;Therapeutic activities;Therapeutic exercise;Balance training;Neuromuscular re-education;Patient/family education;Prosthetic Training  PT Next Visit Plan Continue gait outside surfaces and gait household with prosthesis only  Consulted and Agree with Plan of Care Patient     Patient will benefit from skilled therapeutic intervention in order to improve the following deficits and impairments:  Abnormal gait, Decreased activity tolerance, Decreased balance, Decreased endurance, Decreased mobility, Postural dysfunction, Prosthetic Dependency  Visit Diagnosis: Other abnormalities of gait and mobility  Unsteadiness on feet  Other symptoms and signs involving the musculoskeletal system     Problem List Patient Active Problem List   Diagnosis Date Noted  . Type 2 diabetes with complication ([place]) 36/62/9476  . Phantom pain after amputation of lower extremity ([place]) 01/18/2016  . Neuropathic pain   .  Above knee amputation of right lower extremity ([place]) 12/29/2015  . PAD (peripheral artery disease) ([place]) 12/15/2015  . Hyperlipidemia associated with type 2 diabetes mellitus ([place]) 12/07/2015  . Hypertension associated with diabetes ([place]) 12/04/2014    Willow Ora, PTA, [facility] [address] [place], [place] [address] [phone] 06/29/16, 7:48 PM  Name: JACINTO KEIL MRN: [id number] Date of Birth: 1951-02-16

## 2016-06-30 ENCOUNTER — Encounter: Payer: Self-pay | Admitting: Physical Therapy

## 2016-06-30 ENCOUNTER — Ambulatory Visit: Payer: Medicare Other | Admitting: Physical Therapy

## 2016-06-30 DIAGNOSIS — R29898 Other symptoms and signs involving the musculoskeletal system: Secondary | ICD-10-CM | POA: Diagnosis not present

## 2016-06-30 DIAGNOSIS — R2681 Unsteadiness on feet: Secondary | ICD-10-CM

## 2016-06-30 DIAGNOSIS — R2689 Other abnormalities of gait and mobility: Secondary | ICD-10-CM | POA: Diagnosis not present

## 2016-07-01 NOTE — Therapy (Signed)
[facility] [address] [place], [place], [address] Phone: [phone]   Fax:  [phone]  Physical Therapy Treatment  Patient Details  Name: Adam [name] MRN: [id number] Date of Birth: [birth year] Referring Provider: [name], MD  Encounter Date: 06/30/2016   06/30/16 1021  PT Visits / Re-Eval  Visit Number 28  Number of Visits 31  Date for PT Re-Evaluation 07/11/16  Authorization  Authorization Type Medicare G-code & progress note  PT Time Calculation  PT Start Time 1018  PT Stop Time 1100  PT Time Calculation (min) 42 min  PT - End of Session  Equipment Utilized During Treatment Gait belt  Activity Tolerance Patient tolerated treatment well;No increased pain  Behavior During Therapy WFL for tasks assessed/performed     Past Medical History:  Diagnosis Date  . Gangrene ([place]) 11/22/2015   right great toe  . Hypertension   . OSA on CPAP   . PAD (peripheral artery disease) ([place])   . Peripheral vascular disease ([place])   . Type II diabetes mellitus ([place]) dx'd 11/2015    Past Surgical History:  Procedure Laterality Date  . AMPUTATION Right 12/01/2015   Procedure: AMPUTATION RIGHT GREAT;  Surgeon: Conrad [place], MD;  Location: [facility];  Service: Vascular;  Laterality: Right;  . AMPUTATION Right 12/16/2015   Procedure: AMPUTATION BELOW KNEE;  Surgeon: [name], MD;  Location: [facility];  Service: Vascular;  Laterality: Right;  . AMPUTATION Right 12/26/2015   Procedure: RIGHT ABOVE KNEE AMPUTATION;  Surgeon: [name], MD;  Location: [facility];  Service: Vascular;  Laterality: Right;  . COLONOSCOPY    . FEMORAL-POPLITEAL BYPASS GRAFT Right 11/28/2015   Procedure: RIGHT  FEMORAL-POPLITEAL ARTERY BYPASS GRAFT AND CONSTRUCTION OF MILLER CUFF USING 6 MM X 80 CM PROPATEN  GRAFT ;  Surgeon: Conrad Epping, MD;  Location: [facility];  Service: Vascular;  Laterality: Right;  . INGUINAL HERNIA REPAIR Bilateral 2000  . PERIPHERAL  VASCULAR CATHETERIZATION N/A 11/26/2015   Procedure: Abdominal Aortogram;  Surgeon: [name], MD;  Location: [facility];  Service: Cardiovascular;  Laterality: N/A;  . VASECTOMY      There were no vitals filed for this visit.     06/30/16 1020  Symptoms/Limitations  Subjective No new complaints. No falls or pain to report.  Has been at beach since last Wed and took boat out. Had no issues getting on/off boat.  Pertinent History Likes to be called "[name]".     DM, HTN  Limitations Lifting;Standing;Walking  Patient Stated Goals Use prosthesis to play golf, go to beach house including boating, yardwork  Pain Assessment  Currently in Pain? No/denies      06/30/16 1028  Transfers  Transfers Sit to Stand;Stand to Sit  Sit to Stand 5: Supervision;With upper extremity assist;With armrests;From chair/3-in-1  Stand to Sit 5: Supervision;With upper extremity assist;With armrests;To chair/3-in-1  Ambulation/Gait  Ambulation/Gait Yes  Ambulation/Gait Assistance 5: Supervision  Ambulation/Gait Assistance Details gait with bil walking sticks working on sequencing, incorporating head movements/enviromental scanning for last 300 feet; gait with no AD with stress balls in right hand to incorporate dual tasking and encourage relaxation of right UE with gait with min guard assist for 120 ft; gait while tossing washcloth up and catching with right hand x 230 ft with min guard assist with single walking stick for balance, pt self retrieving wash cloth from floor with supervison and single UE support on walking stick when  missing the catch                              Ambulation Distance (Feet) 650 Feet (x1, 120 x1, 230 x1)  Assistive device Prosthesis;Other (Comment) (walking sticks)  Gait Pattern Decreased step length - left;Decreased hip/knee flexion - right;Decreased weight shift to right;Decreased stance time - right;Abducted- right;Decreased arm swing - right;Right circumduction   Ambulation Surface Level;Indoor  High Level Balance  High Level Balance Activities Negotitating around obstacles;Negotiating over obstacles  High Level Balance Comments worked on stepping over bolsters of vaired heights with bil walking sticks, min guard assist to supervision with cues on sequencing and to lead with prosthesis going forward. figure 8's around 2 hoola hoops with bil walking sticks with cues on step length and pelvic positioning, supervision for balance.                                    Prosthetics  Current prosthetic wear tolerance (days/week)  daily  Current prosthetic wear tolerance (#hours/day)  all awake hours  Residual limb condition  reports no issues  Donning Prosthesis 6  Doffing Prosthesis 6           PT Short Term Goals - 06/09/16 1030      PT SHORT TERM GOAL #1   Title Patient tolerates wear of prosthesis >10hrs total /day without skin issues or limb pain. (Target Date: 04/18/2016)   Baseline Met: 04/16/16   Time 4   Period Weeks   Status Achieved     PT SHORT TERM GOAL #2   Title Patient demonstrates proper donning & verbalizes proper cleaning of prosthesis.  (Target Date: 04/18/2016)   Baseline Met: 04/16/16   Time 4   Period Weeks   Status Achieved     PT SHORT TERM GOAL #3   Title Patient ambulates 200' with crutches & prosthesis safely with cues only for deviations not physical assist.  (Target Date: 04/18/2016)   Baseline Met: 04/16/16   Time 4   Period Weeks   Status Achieved     PT SHORT TERM GOAL #4   Title Patient negotiates ramps, curbs and stairs (1 rail) with crutches with supervision.  (Target Date: 04/18/2016)   Baseline Met: 04/16/16   Status Achieved     PT SHORT TERM GOAL #5   Title Patient able to reach 7" anteriorly, laterally, across midline and pick up object from floor with prosthesis with supervision.  (Target Date: 04/18/2016)   Baseline Pt able to reach anteriorally, laterally and across midline 7" with supervision, picking  object up from floor Min A    Time 4   Period Weeks   Status Partially Met     Additional Short Term Goals   Additional Short Term Goals Yes     PT SHORT TERM GOAL #6   Title Pt will be able to ambulate 500' with SPC including grass and unlevel surfaces with supervision (New Target Date: 06/11/2016)   Baseline Met 7/24.   Time 1   Period Months   Status Achieved     PT SHORT TERM GOAL #7   Title Pt will be able to negotiate ramp, curb and stairs with one rail with [facility] with supervision (New Target Date: 06/11/2016)   Baseline Met 7/24.   Time 1   Status Achieved  PT Long Term Goals - 05/21/16 1230      PT LONG TERM GOAL #1   Title Patient verbalizes & demonstrates understanding of prosthetic care to enable safe use of prosthesis. (Target Date: 05/16/2016)   Baseline Met 05/12/16   Time 8   Period Weeks   Status Achieved     PT LONG TERM GOAL #2   Title Patient tolerates wear of prosthesis >90% of awake hours without skin issues or limb tenderness to enable function throughout his awake hours.  (Target Date: 05/16/2016)   Baseline Met 05/12/16   Time 8   Period Weeks   Status Achieved     PT LONG TERM GOAL #3   Title Berg Balance Test > 42/56 to indicate lower fall risk.  (New Target Date: 07/11/2016)   Baseline 05/12/2016: Berg Balance Score 29/56   Time 2   Period Months   Status On-going     PT LONG TERM GOAL #4   Title Patient ambulates 800' with LRAD & prosthesis outside including grass, ramps & curbs modified independent for community access.   (New Target Date: 07/11/2016)   Baseline 05/12/2016 Pt was able to ambulate 500' with SPC including grass, ramp and curbs with Min Assist    Time 2   Period Months   Status On-going     PT LONG TERM GOAL #5   Title Patient ambulates around furniture carrrying plate & cup with only prosthesis independently.  (New Target Date: 07/11/2016)   Time 2   Period Months   Status On-going     PT LONG TERM GOAL #6    Title Patient able to transfer on/off ground and demonstrate yard work and golf swing with prosthesis modified independent to return to leisure activities.  (New Target Date: 08/25//2017)   Baseline 05/12/2016: Pt was unable to independently attain position without cuing and demostration from PT   Time 2   Period Months   Status On-going     PT LONG TERM GOAL #7   Title Patient will increase Functional Gait Assessment 7/30 to >/= 19/30 to indicate decreased risk for falls (New Target Date 07/11/16)   Time 2   Period Months   Status On-going        06/30/16 1021  Plan  Clinical Impression Statement today's session continued to address gait with walking sticks and dynamic gait tasks involving dual tasking. No issues reported. Pt is making steady progress toward goals  Pt will benefit from skilled therapeutic intervention in order to improve on the following deficits Abnormal gait;Decreased activity tolerance;Decreased balance;Decreased endurance;Decreased mobility;Postural dysfunction;Prosthetic Dependency  Rehab Potential Good  PT Frequency 2x / week  PT Duration Other (comment) (9 weeks (60 days))  PT Treatment/Interventions ADLs/Self Care Home Management;DME Instruction;Gait training;Stair training;Functional mobility training;Therapeutic activities;Therapeutic exercise;Balance training;Neuromuscular re-education;Patient/family education;Prosthetic Training  PT Next Visit Plan Continue gait outside surfaces and gait household with prosthesis only;assess goals next week due to anticipated discharge at end of plan of care next week  Consulted and Agree with Plan of Care Patient      Patient will benefit from skilled therapeutic intervention in order to improve the following deficits and impairments:  Abnormal gait, Decreased activity tolerance, Decreased balance, Decreased endurance, Decreased mobility, Postural dysfunction, Prosthetic Dependency  Visit Diagnosis: Other  abnormalities of gait and mobility  Unsteadiness on feet  Other symptoms and signs involving the musculoskeletal system     Problem List Patient Active Problem List   Diagnosis Date Noted  . Type 2  diabetes with complication ([facility]) 55/25/8948  . Phantom pain after amputation of lower extremity ([facility]) 01/18/2016  . Neuropathic pain   . Above knee amputation of right lower extremity ([place]) 12/29/2015  . PAD (peripheral artery disease) ([facility]) 12/15/2015  . Hyperlipidemia associated with type 2 diabetes mellitus ([facility]) 12/07/2015  . Hypertension associated with diabetes ([facility]) 12/04/2014    Willow Ora, PTA, [facility] [address] [place], [place] [address] [phone] 07/01/16, 10:40 PM   Name: Adam [name] MRN: [id number] Date of Birth: 06-19-1951

## 2016-07-02 ENCOUNTER — Ambulatory Visit: Payer: Medicare Other | Admitting: Physical Therapy

## 2016-07-02 DIAGNOSIS — R29898 Other symptoms and signs involving the musculoskeletal system: Secondary | ICD-10-CM | POA: Diagnosis not present

## 2016-07-02 DIAGNOSIS — R2689 Other abnormalities of gait and mobility: Secondary | ICD-10-CM

## 2016-07-02 DIAGNOSIS — R2681 Unsteadiness on feet: Secondary | ICD-10-CM

## 2016-07-02 NOTE — Therapy (Signed)
[facility] [address] [place], [place], [address] Phone: [phone]   Fax:  [phone]  Physical Therapy Treatment  Patient Details  Name: Adam [name] MRN: [id number] Date of Birth: July 05, 1951 Referring Provider: [name], MD  Encounter Date: 07/02/2016      PT End of Session - 07/02/16 1558    Visit Number 29   Number of Visits 31   Date for PT Re-Evaluation 07/11/16   Authorization Type Medicare G-code & progress note   PT Start Time 1108   PT Stop Time 1146   PT Time Calculation (min) 38 min   Equipment Utilized During Treatment Gait belt   Activity Tolerance Patient tolerated treatment well;No increased pain   Behavior During Therapy WFL for tasks assessed/performed      Past Medical History:  Diagnosis Date  . Gangrene ([facility]) 11/22/2015   right great toe  . Hypertension   . OSA on CPAP   . PAD (peripheral artery disease) ([facility])   . Peripheral vascular disease ([facility])   . Type II diabetes mellitus ([place]) dx'd 11/2015    Past Surgical History:  Procedure Laterality Date  . AMPUTATION Right 12/01/2015   Procedure: AMPUTATION RIGHT GREAT;  Surgeon: Conrad [place], MD;  Location: [facility];  Service: Vascular;  Laterality: Right;  . AMPUTATION Right 12/16/2015   Procedure: AMPUTATION BELOW KNEE;  Surgeon: [name], MD;  Location: [facility];  Service: Vascular;  Laterality: Right;  . AMPUTATION Right 12/26/2015   Procedure: RIGHT ABOVE KNEE AMPUTATION;  Surgeon: [name], MD;  Location: [facility];  Service: Vascular;  Laterality: Right;  . COLONOSCOPY    . FEMORAL-POPLITEAL BYPASS GRAFT Right 11/28/2015   Procedure: RIGHT  FEMORAL-POPLITEAL ARTERY BYPASS GRAFT AND CONSTRUCTION OF MILLER CUFF USING 6 MM X 80 CM PROPATEN  GRAFT ;  Surgeon: Conrad Crouch, MD;  Location: [facility];  Service: Vascular;  Laterality: Right;  . INGUINAL HERNIA REPAIR Bilateral 2000  . PERIPHERAL VASCULAR CATHETERIZATION N/A 11/26/2015   Procedure: Abdominal Aortogram;  Surgeon: [name], MD;  Location: [facility];  Service: Cardiovascular;  Laterality: N/A;  . VASECTOMY      There were no vitals filed for this visit.      Subjective Assessment - 07/02/16 1125    Subjective No new complaints. No falls or pain to report.     Pertinent History Likes to be called "[name]".     DM, HTN   Limitations Lifting;Standing;Walking   Patient Stated Goals Use prosthesis to play golf, go to beach house including boating, yardwork   Currently in Pain? No/denies                         [facility] Adult PT Treatment/Exercise - 07/02/16 0001      Ambulation/Gait   Ambulation/Gait Yes   Ambulation/Gait Assistance 5: Supervision   Ambulation/Gait Assistance Details practised balance with head turns on compliant suface working on Left ankle dorsiflexion with initial heel strike for greater foot clearance.   Ambulation Distance (Feet) 400 Feet   Assistive device Prosthesis;Other (Comment)  walking poles   Gait Pattern Decreased step length - left;Decreased hip/knee flexion - right;Decreased weight shift to right;Decreased stance time - right;Abducted- right;Decreased arm swing - right;Right circumduction;Left foot flat   Ambulation Surface Unlevel;Outdoor;Gravel;Grass;Paved   Curb 5: Supervision   Curb Details (indicate cue type and reason) with walking poles, no LOB  Balance Exercises - 07/02/16 1549      Balance Exercises: Standing   Wall Bumps Shoulder;Hip  cues for body mechanics   Wall Bumps-Shoulders Anterior/posterior   Wall Bumps-Hips Anterior/posterior   Turning Right;Left  with walking poles   Step Over Hurdles / Cones stepping over obstacle on compliant surface with walking poles, working on Left ankle dorsiflexion with initial heel strike.  LOB, fall-no injury.           PT Education - 07/02/16 1553    Education provided Yes   Education Details importance on  maintaining Left foot clearance to decrease fall risk.   Person(s) Educated Patient   Methods Explanation;Demonstration;Verbal cues   Comprehension Verbalized understanding;Returned demonstration          PT Short Term Goals - 06/09/16 1030      PT SHORT TERM GOAL #1   Title Patient tolerates wear of prosthesis >10hrs total /day without skin issues or limb pain. (Target Date: 04/18/2016)   Baseline Met: 04/16/16   Time 4   Period Weeks   Status Achieved     PT SHORT TERM GOAL #2   Title Patient demonstrates proper donning & verbalizes proper cleaning of prosthesis.  (Target Date: 04/18/2016)   Baseline Met: 04/16/16   Time 4   Period Weeks   Status Achieved     PT SHORT TERM GOAL #3   Title Patient ambulates 200' with crutches & prosthesis safely with cues only for deviations not physical assist.  (Target Date: 04/18/2016)   Baseline Met: 04/16/16   Time 4   Period Weeks   Status Achieved     PT SHORT TERM GOAL #4   Title Patient negotiates ramps, curbs and stairs (1 rail) with crutches with supervision.  (Target Date: 04/18/2016)   Baseline Met: 04/16/16   Status Achieved     PT SHORT TERM GOAL #5   Title Patient able to reach 7" anteriorly, laterally, across midline and pick up object from floor with prosthesis with supervision.  (Target Date: 04/18/2016)   Baseline Pt able to reach anteriorally, laterally and across midline 7" with supervision, picking object up from floor Min A    Time 4   Period Weeks   Status Partially Met     Additional Short Term Goals   Additional Short Term Goals Yes     PT SHORT TERM GOAL #6   Title Pt will be able to ambulate 500' with SPC including grass and unlevel surfaces with supervision (New Target Date: 06/11/2016)   Baseline Met 7/24.   Time 1   Period Months   Status Achieved     PT SHORT TERM GOAL #7   Title Pt will be able to negotiate ramp, curb and stairs with one rail with [facility] with supervision (New Target Date: 06/11/2016)    Baseline Met 7/24.   Time 1   Status Achieved           PT Long Term Goals - 05/21/16 1230      PT LONG TERM GOAL #1   Title Patient verbalizes & demonstrates understanding of prosthetic care to enable safe use of prosthesis. (Target Date: 05/16/2016)   Baseline Met 05/12/16   Time 8   Period Weeks   Status Achieved     PT LONG TERM GOAL #2   Title Patient tolerates wear of prosthesis >90% of awake hours without skin issues or limb tenderness to enable function throughout his awake hours.  (Target Date: 05/16/2016)  Baseline Met 05/12/16   Time 8   Period Weeks   Status Achieved     PT LONG TERM GOAL #3   Title Berg Balance Test > 42/56 to indicate lower fall risk.  (New Target Date: 07/11/2016)   Baseline 05/12/2016: Berg Balance Score 29/56   Time 2   Period Months   Status On-going     PT LONG TERM GOAL #4   Title Patient ambulates 800' with LRAD & prosthesis outside including grass, ramps & curbs modified independent for community access.   (New Target Date: 07/11/2016)   Baseline 05/12/2016 Pt was able to ambulate 500' with SPC including grass, ramp and curbs with Min Assist    Time 2   Period Months   Status On-going     PT LONG TERM GOAL #5   Title Patient ambulates around furniture carrrying plate & cup with only prosthesis independently.  (New Target Date: 07/11/2016)   Time 2   Period Months   Status On-going     PT LONG TERM GOAL #6   Title Patient able to transfer on/off ground and demonstrate yard work and golf swing with prosthesis modified independent to return to leisure activities.  (New Target Date: 08/25//2017)   Baseline 05/12/2016: Pt was unable to independently attain position without cuing and demostration from PT   Time 2   Period Months   Status On-going     PT LONG TERM GOAL #7   Title Patient will increase Functional Gait Assessment 7/30 to >/= 19/30 to indicate decreased risk for falls (New Target Date 07/11/16)   Time 2   Period  Months   Status On-going               Plan - 07/02/16 1559    Clinical Impression Statement Noted pt tends to land flat foot with L foot duing initial heelstrike. Work on increased ankle DF during initial heelstrike with gait training on compliant outdoor surfaces, barrier negotiation, and ankle/hip strategy tasks. Good carry over during session.    Pt did have a fall with no injury during gait training with walking poles on compliant surface during obstacle negotiation.                                                                           Rehab Potential Good   PT Frequency 2x / week   PT Duration Other (comment)  9 weeks (60 days)   PT Treatment/Interventions ADLs/Self Care Home Management;DME Instruction;Gait training;Stair training;Functional mobility training;Therapeutic activities;Therapeutic exercise;Balance training;Neuromuscular re-education;Patient/family education;Prosthetic Training   PT Next Visit Plan Continue gait outside surfaces and gait household with prosthesis only;assess goals next week due to anticipated discharge at end of plan of care next week   Consulted and Agree with Plan of Care Patient      Patient will benefit from skilled therapeutic intervention in order to improve the following deficits and impairments:  Abnormal gait, Decreased activity tolerance, Decreased balance, Decreased endurance, Decreased mobility, Postural dysfunction, Prosthetic Dependency  Visit Diagnosis: Other abnormalities of gait and mobility  Unsteadiness on feet  Other symptoms and signs involving the musculoskeletal system     Problem List Patient Active Problem List   Diagnosis Date Noted  .  Type 2 diabetes with complication ([place]) 17/91/5056  . Phantom pain after amputation of lower extremity ([place]) 01/18/2016  . Neuropathic pain   . Above knee amputation of right lower extremity ([place]) 12/29/2015  . PAD (peripheral artery disease) ([place]) 12/15/2015  . Hyperlipidemia  associated with type 2 diabetes mellitus ([place]) 12/07/2015  . Hypertension associated with diabetes ([place]) 12/04/2014    Bjorn Loser, PTA  07/02/16, 4:05 PM [facility] [address] [place], [place], [address] Phone: [phone]   Fax:  [phone]  Name: Adam [name] MRN: [id number] Date of Birth: Nov 12, 1951

## 2016-07-07 ENCOUNTER — Ambulatory Visit: Payer: Medicare Other | Admitting: Physical Therapy

## 2016-07-07 ENCOUNTER — Encounter: Payer: Self-pay | Admitting: Physical Therapy

## 2016-07-07 DIAGNOSIS — R2681 Unsteadiness on feet: Secondary | ICD-10-CM

## 2016-07-07 DIAGNOSIS — R2689 Other abnormalities of gait and mobility: Secondary | ICD-10-CM

## 2016-07-07 DIAGNOSIS — R29898 Other symptoms and signs involving the musculoskeletal system: Secondary | ICD-10-CM | POA: Diagnosis not present

## 2016-07-07 NOTE — Therapy (Signed)
[facility] [address] [place], [place], [address] Phone: [phone]   Fax:  [phone]  Physical Therapy Treatment  Patient Details  Name: Adam [name] MRN: [id number] Date of Birth: 10/22/1951 Referring Provider: Gretta Began, MD  Encounter Date: 07/07/2016      PT End of Session - 07/07/16 1448    Visit Number 30   Number of Visits 31   Date for PT Re-Evaluation 07/11/16   Authorization Type Medicare G-code & progress note   PT Start Time 1015   PT Stop Time 1058   PT Time Calculation (min) 43 min   Equipment Utilized During Treatment Gait belt   Activity Tolerance Patient tolerated treatment well;No increased pain   Behavior During Therapy WFL for tasks assessed/performed      Past Medical History:  Diagnosis Date  . Gangrene (HCC) 11/22/2015   right great toe  . Hypertension   . OSA on CPAP   . PAD (peripheral artery disease) (HCC)   . Peripheral vascular disease (HCC)   . Type II diabetes mellitus (HCC) dx'd 11/2015    Past Surgical History:  Procedure Laterality Date  . AMPUTATION Right 12/01/2015   Procedure: AMPUTATION RIGHT GREAT;  Surgeon: Fransisco Hertz, MD;  Location: [facility] OR;  Service: Vascular;  Laterality: Right;  . AMPUTATION Right 12/16/2015   Procedure: AMPUTATION BELOW KNEE;  Surgeon: Larina Earthly, MD;  Location: [facility] OR;  Service: Vascular;  Laterality: Right;  . AMPUTATION Right 12/26/2015   Procedure: RIGHT ABOVE KNEE AMPUTATION;  Surgeon: Larina Earthly, MD;  Location: [facility] OR;  Service: Vascular;  Laterality: Right;  . COLONOSCOPY    . FEMORAL-POPLITEAL BYPASS GRAFT Right 11/28/2015   Procedure: RIGHT  FEMORAL-POPLITEAL ARTERY BYPASS GRAFT AND CONSTRUCTION OF MILLER CUFF USING 6 MM X 80 CM PROPATEN  GRAFT ;  Surgeon: Fransisco Hertz, MD;  Location: [facility] OR;  Service: Vascular;  Laterality: Right;  . INGUINAL HERNIA REPAIR Bilateral 2000  . PERIPHERAL VASCULAR CATHETERIZATION N/A 11/26/2015   Procedure: Abdominal Aortogram;  Surgeon: Chuck Hint, MD;  Location: [facility] INVASIVE CV LAB;  Service: Cardiovascular;  Laterality: N/A;  . VASECTOMY      There were no vitals filed for this visit.      Subjective Assessment - 07/07/16 1015    Subjective Wearing prosthesis all day. Only issues is managing socks is annoying.    Patient is accompained by: Family member   Pertinent History Likes to be called "[name]".     DM, HTN   Limitations Lifting;Standing;Walking   Patient Stated Goals Use prosthesis to play golf, go to beach house including boating, yardwork   Currently in Pain? No/denies            [facility] PT Assessment - 07/07/16 1015      Ambulation/Gait   Gait velocity 1.78 ft/sec no device, 2.05 ft/sec cane  2.58 ft/sec fast with cane     Berg Balance Test   Sit to Stand Able to stand without using hands and stabilize independently   Standing Unsupported Able to stand safely 2 minutes   Sitting with Back Unsupported but Feet Supported on Floor or Stool Able to sit safely and securely 2 minutes   Stand to Sit Sits safely with minimal use of hands   Transfers Able to transfer safely, minor use of hands   Standing Unsupported with Eyes Closed Able to stand 10 seconds safely   Standing Ubsupported with Feet Together Able to  place feet together independently and stand 1 minute safely   From Standing, Reach Forward with Outstretched Arm Can reach confidently >25 cm (10")   From Standing Position, Pick up Object from Floor Able to pick up shoe safely and easily   From Standing Position, Turn to Look Behind Over each Shoulder Looks behind from both sides and weight shifts well   Turn 360 Degrees Able to turn 360 degrees safely but slowly   Standing Unsupported, Alternately Place Feet on Step/Stool Able to stand independently and safely and complete 8 steps in 20 seconds   Standing Unsupported, One Foot in Front Able to plae foot ahead of the other independently and hold 30  seconds   Standing on One Leg Able to lift leg independently and hold equal to or more than 3 seconds   Total Score 51   Berg comment: Initial was 23/56     Prosthetic Training: Patient ambulated outside 500' including grass/gravel and inside 300' with prosthesis only with supervision at slow cautious speed. Tactile & verbal cues on prosthesis control, transitioning between surfaces and balance reactions. Patient negotiated ramp & curb with prosthesis only with supervision. Treadmill: with BUE support with verbal cues to lighten weight bearing on UEs, increase prosthetic stance duration & increase fluency of prosthesis advancement with pelvis oriented forward. PT increased speed up to 2. for 6 min total on treadmill. Carryover with gait overground with cues on fluency. PT instructed in benefits and recommendation for Treadmill gait to improve fluency of gait.                           PT Short Term Goals - 06/09/16 1030      PT SHORT TERM GOAL #1   Title Patient tolerates wear of prosthesis >10hrs total /day without skin issues or limb pain. (Target Date: 04/18/2016)   Baseline Met: 04/16/16   Time 4   Period Weeks   Status Achieved     PT SHORT TERM GOAL #2   Title Patient demonstrates proper donning & verbalizes proper cleaning of prosthesis.  (Target Date: 04/18/2016)   Baseline Met: 04/16/16   Time 4   Period Weeks   Status Achieved     PT SHORT TERM GOAL #3   Title Patient ambulates 200' with crutches & prosthesis safely with cues only for deviations not physical assist.  (Target Date: 04/18/2016)   Baseline Met: 04/16/16   Time 4   Period Weeks   Status Achieved     PT SHORT TERM GOAL #4   Title Patient negotiates ramps, curbs and stairs (1 rail) with crutches with supervision.  (Target Date: 04/18/2016)   Baseline Met: 04/16/16   Status Achieved     PT SHORT TERM GOAL #5   Title Patient able to reach 7" anteriorly, laterally, across midline and pick  up object from floor with prosthesis with supervision.  (Target Date: 04/18/2016)   Baseline Pt able to reach anteriorally, laterally and across midline 7" with supervision, picking object up from floor Min A    Time 4   Period Weeks   Status Partially Met     Additional Short Term Goals   Additional Short Term Goals Yes     PT SHORT TERM GOAL #6   Title Pt will be able to ambulate 500' with SPC including grass and unlevel surfaces with supervision (New Target Date: 06/11/2016)   Baseline Met 7/24.   Time 1   Period  Months   Status Achieved     PT SHORT TERM GOAL #7   Title Pt will be able to negotiate ramp, curb and stairs with one rail with [facility] with supervision (New Target Date: 06/11/2016)   Baseline Met 7/24.   Time 1   Status Achieved           PT Long Term Goals - 07/07/16 1450      PT LONG TERM GOAL #1   Title Patient verbalizes & demonstrates understanding of prosthetic care to enable safe use of prosthesis. (Target Date: 05/16/2016)   Baseline Met 05/12/16   Time 8   Period Weeks   Status Achieved     PT LONG TERM GOAL #2   Title Patient tolerates wear of prosthesis >90% of awake hours without skin issues or limb tenderness to enable function throughout his awake hours.  (Target Date: 05/16/2016)   Baseline Met 05/12/16   Time 8   Period Weeks   Status Achieved     PT LONG TERM GOAL #3   Title Berg Balance Test > 42/56 to indicate lower fall risk.  (New Target Date: 07/11/2016)   Baseline MET 07/07/2016 Berg Balance 51 /56   Time 2   Period Months   Status Achieved     PT LONG TERM GOAL #4   Title Patient ambulates 800' with LRAD & prosthesis outside including grass, ramps & curbs modified independent for community access.   (New Target Date: 07/11/2016)   Time 2   Period Months   Status On-going     PT LONG TERM GOAL #5   Title Patient ambulates around furniture carrrying plate & cup with only prosthesis independently.  (New Target Date: 07/11/2016)    Time 2   Period Months   Status On-going     PT LONG TERM GOAL #6   Title Patient able to transfer on/off ground and demonstrate yard work and golf swing with prosthesis modified independent to return to leisure activities.  (New Target Date: 08/25//2017)   Time 2   Period Months   Status On-going     PT LONG TERM GOAL #7   Title Patient will increase Functional Gait Assessment 7/30 to >/= 19/30 to indicate decreased risk for falls (New Target Date 07/11/16)   Time 2   Period Months   Status On-going               Plan - 07/07/16 1451    Clinical Impression Statement Patient's Berg Balance test improved to 51/56 indicating lower fall risk. Initial score was 23/56. Patient is on target to meet remaining LTGs next session.    Rehab Potential Good   PT Frequency 2x / week   PT Duration Other (comment)  9 weeks (60 days)   PT Treatment/Interventions ADLs/Self Care Home Management;DME Instruction;Gait training;Stair training;Functional mobility training;Therapeutic activities;Therapeutic exercise;Balance training;Neuromuscular re-education;Patient/family education;Prosthetic Training   PT Next Visit Plan assess remaining LTGs and discharge   Consulted and Agree with Plan of Care Patient      Patient will benefit from skilled therapeutic intervention in order to improve the following deficits and impairments:  Abnormal gait, Decreased activity tolerance, Decreased balance, Decreased endurance, Decreased mobility, Postural dysfunction, Prosthetic Dependency  Visit Diagnosis: Other abnormalities of gait and mobility  Unsteadiness on feet  Other symptoms and signs involving the musculoskeletal system     Problem List Patient Active Problem List   Diagnosis Date Noted  . Type 2 diabetes with complication ([place]) 40/98/1191  .  Phantom pain after amputation of lower extremity ([place]) 01/18/2016  . Neuropathic pain   . Above knee amputation of right lower extremity ([place])  12/29/2015  . PAD (peripheral artery disease) ([place]) 12/15/2015  . Hyperlipidemia associated with type 2 diabetes mellitus ([facility]) 12/07/2015  . Hypertension associated with diabetes ([place]) 12/04/2014    Jarome Trull PT, DPT 07/07/2016, 2:57 PM  [place] [address] [place], [place], [address] Phone: [phone]   Fax:  [phone]  Name: ZADRIAN MCCAULEY MRN: [id number] Date of Birth: Aug 08, 1951

## 2016-07-08 ENCOUNTER — Ambulatory Visit: Payer: Medicare Other | Admitting: Physical Therapy

## 2016-07-08 ENCOUNTER — Encounter: Payer: Self-pay | Admitting: Physical Therapy

## 2016-07-08 DIAGNOSIS — R2681 Unsteadiness on feet: Secondary | ICD-10-CM | POA: Diagnosis not present

## 2016-07-08 DIAGNOSIS — R29898 Other symptoms and signs involving the musculoskeletal system: Secondary | ICD-10-CM | POA: Diagnosis not present

## 2016-07-08 DIAGNOSIS — R2689 Other abnormalities of gait and mobility: Secondary | ICD-10-CM

## 2016-07-08 NOTE — Therapy (Signed)
[facility] [address] [place], [place], [address] Phone: [phone]   Fax:  [phone]  Physical Therapy Treatment  Patient Details  Name: Adam [name] MRN: [id number] Date of Birth: 1951/02/12 Referring Provider: [name], MD  Encounter Date: 07/08/2016      PT End of Session - 07/08/16 2135    Visit Number 31   Number of Visits 31   Date for PT Re-Evaluation 07/11/16   Authorization Type Medicare G-code & progress note   PT Start Time 0801   PT Stop Time 0845   PT Time Calculation (min) 44 min   Equipment Utilized During Treatment Gait belt   Activity Tolerance Patient tolerated treatment well;No increased pain   Behavior During Therapy WFL for tasks assessed/performed      Past Medical History:  Diagnosis Date  . Gangrene ([place]) 11/22/2015   right great toe  . Hypertension   . OSA on CPAP   . PAD (peripheral artery disease) ([place])   . Peripheral vascular disease ([place])   . Type II diabetes mellitus ([place]) dx'd 11/2015    Past Surgical History:  Procedure Laterality Date  . AMPUTATION Right 12/01/2015   Procedure: AMPUTATION RIGHT GREAT;  Surgeon: Conrad [place], MD;  Location: [facility];  Service: Vascular;  Laterality: Right;  . AMPUTATION Right 12/16/2015   Procedure: AMPUTATION BELOW KNEE;  Surgeon: [name], MD;  Location: [facility];  Service: Vascular;  Laterality: Right;  . AMPUTATION Right 12/26/2015   Procedure: RIGHT ABOVE KNEE AMPUTATION;  Surgeon: [name], MD;  Location: [facility];  Service: Vascular;  Laterality: Right;  . COLONOSCOPY    . FEMORAL-POPLITEAL BYPASS GRAFT Right 11/28/2015   Procedure: RIGHT  FEMORAL-POPLITEAL ARTERY BYPASS GRAFT AND CONSTRUCTION OF MILLER CUFF USING 6 MM X 80 CM PROPATEN  GRAFT ;  Surgeon: Conrad Paton, MD;  Location: [facility];  Service: Vascular;  Laterality: Right;  . INGUINAL HERNIA REPAIR Bilateral 2000  . PERIPHERAL VASCULAR CATHETERIZATION N/A 11/26/2015    Procedure: Abdominal Aortogram;  Surgeon: [name], MD;  Location: [facility];  Service: Cardiovascular;  Laterality: N/A;  . VASECTOMY      There were no vitals filed for this visit.      Subjective Assessment - 07/08/16 0809    Subjective No issues. Feels new prosthesis will help a lot. PT reviewed his goals and pt feels he met his goals but will do better with more advanced prosthesis.    Pertinent History Likes to be called "[name]".     DM, HTN   Limitations Lifting;Standing;Walking   Patient Stated Goals Use prosthesis to play golf, go to beach house including boating, yardwork   Currently in Pain? No/denies            [facility] PT Assessment - 07/08/16 0800      Posture/Postural Control   Posture/Postural Control Postural limitations   Postural Limitations Weight shift left     Transfers   Transfers Sit to Stand;Stand to Sit;Floor to Transfer;Stand Pivot Transfers   Sit to Stand 6: Modified independent (Device/Increase time);Without upper extremity assist;From chair/3-in-1  prosthesis only   Stand to Sit 6: Modified independent (Device/Increase time);Without upper extremity assist;To chair/3-in-1  with prosthesis only   Stand Pivot Transfers 6: Modified independent (Device/Increase time)  prosthesis only   Floor to Transfer 6: Modified independent (Device/Increase time);With upper extremity assist  pushing on horizontal surface like chair     Ambulation/Gait  Ambulation/Gait Yes   Ambulation/Gait Assistance 6: Modified independent (Device/Increase time)   Ambulation/Gait Assistance Details household & limited community with prosthesis only, full community with single point cane or Walking sticks   Ambulation Distance (Feet) 1000 Feet   Assistive device Prosthesis;None;Straight cane  no device <500' & cane >500'   Gait Pattern Step-through pattern;Decreased arm swing - right;Abducted- right   Ambulation Surface  Indoor;Level;Outdoor;Unlevel;Paved;Grass;Gravel   Gait velocity 1.86 ft/sec no device, 2.21 ft/sec cane  2.62 ft/sec fast with cane   Stairs Yes   Stairs Assistance 6: Modified independent (Device/Increase time)   Stair Management Technique No rails;Step to pattern;Forwards  with prosthesis   Number of Stairs 4   Ramp 6: Modified independent (Device)  prosthesis only   Curb 6: Modified independent (Device/increase time)  prosthesis only     Berg Balance Test   Sit to Stand Able to stand without using hands and stabilize independently   Standing Unsupported Able to stand safely 2 minutes   Sitting with Back Unsupported but Feet Supported on Floor or Stool Able to sit safely and securely 2 minutes   Stand to Sit Sits safely with minimal use of hands   Transfers Able to transfer safely, minor use of hands   Standing Unsupported with Eyes Closed Able to stand 10 seconds safely   Standing Ubsupported with Feet Together Able to place feet together independently and stand 1 minute safely   From Standing, Reach Forward with Outstretched Arm Can reach confidently >25 cm (10")   From Standing Position, Pick up Object from Floor Able to pick up shoe safely and easily   From Standing Position, Turn to Look Behind Over each Shoulder Looks behind from both sides and weight shifts well   Turn 360 Degrees Able to turn 360 degrees safely but slowly   Standing Unsupported, Alternately Place Feet on Step/Stool Able to stand independently and safely and complete 8 steps in 20 seconds   Standing Unsupported, One Foot in Front Able to plae foot ahead of the other independently and hold 30 seconds   Standing on One Leg Able to lift leg independently and hold equal to or more than 3 seconds   Total Score 51     Functional Gait  Assessment   Gait assessed  Yes   Gait Level Surface Walks 20 ft, slow speed, abnormal gait pattern, evidence for imbalance or deviates 10-15 in outside of the 12 in walkway width.  Requires more than 7 sec to ambulate 20 ft.   Change in Gait Speed Able to change speed, demonstrates mild gait deviations, deviates 6-10 in outside of the 12 in walkway width, or no gait deviations, unable to achieve a major change in velocity, or uses a change in velocity, or uses an assistive device.   Gait with Horizontal Head Turns Performs head turns smoothly with slight change in gait velocity (eg, minor disruption to smooth gait path), deviates 6-10 in outside 12 in walkway width, or uses an assistive device.   Gait with Vertical Head Turns Performs task with slight change in gait velocity (eg, minor disruption to smooth gait path), deviates 6 - 10 in outside 12 in walkway width or uses assistive device   Gait and Pivot Turn Pivot turns safely in greater than 3 sec and stops with no loss of balance, or pivot turns safely within 3 sec and stops with mild imbalance, requires small steps to catch balance.   Step Over Obstacle Is able to step over one  shoe box (4.5 in total height) without changing gait speed. No evidence of imbalance.   Gait with Narrow Base of Support Ambulates less than 4 steps heel to toe or cannot perform without assistance.   Gait with Eyes Closed Walks 20 ft, slow speed, abnormal gait pattern, evidence for imbalance, deviates 10-15 in outside 12 in walkway width. Requires more than 9 sec to ambulate 20 ft.   Ambulating Backwards Walks 20 ft, slow speed, abnormal gait pattern, evidence for imbalance, deviates 10-15 in outside 12 in walkway width.   Steps Two feet to a stair, must use rail.   Total Score 14         Prosthetics Assessment - 07/08/16 0800      Prosthetics   Prosthetic Care Independent with Residual limb care;Skin check;Care of non-amputated limb;Prosthetic cleaning;Ply sock cleaning;Correct ply sock adjustment;Proper wear schedule/adjustment;Proper weight-bearing schedule/adjustment   Donning prosthesis  Modified independent (Device/Increase time)    Doffing prosthesis  Modified independent (Device/Increase time)   Current prosthetic wear tolerance (days/week)  daily   Current prosthetic wear tolerance (#hours/day)  all awake hours   Current prosthetic weight-bearing tolerance (hours/day)  tolerates 30 min of standing without c/o pain or discomfort   Residual limb condition  intacdt   K code/activity level with prosthetic use  K3 full community with variable cadence                    [facility] Adult PT Treatment/Exercise - 07/08/16 0800      Therapeutic Activites    Therapeutic Activities Other Therapeutic Activities   Other Therapeutic Activities demonstrates safely "weed-eating", golf swing, lifting & carrying                   PT Short Term Goals - 06/09/16 1030      PT SHORT TERM GOAL #1   Title Patient tolerates wear of prosthesis >10hrs total /day without skin issues or limb pain. (Target Date: 04/18/2016)   Baseline Met: 04/16/16   Time 4   Period Weeks   Status Achieved     PT SHORT TERM GOAL #2   Title Patient demonstrates proper donning & verbalizes proper cleaning of prosthesis.  (Target Date: 04/18/2016)   Baseline Met: 04/16/16   Time 4   Period Weeks   Status Achieved     PT SHORT TERM GOAL #3   Title Patient ambulates 200' with crutches & prosthesis safely with cues only for deviations not physical assist.  (Target Date: 04/18/2016)   Baseline Met: 04/16/16   Time 4   Period Weeks   Status Achieved     PT SHORT TERM GOAL #4   Title Patient negotiates ramps, curbs and stairs (1 rail) with crutches with supervision.  (Target Date: 04/18/2016)   Baseline Met: 04/16/16   Status Achieved     PT SHORT TERM GOAL #5   Title Patient able to reach 7" anteriorly, laterally, across midline and pick up object from floor with prosthesis with supervision.  (Target Date: 04/18/2016)   Baseline Pt able to reach anteriorally, laterally and across midline 7" with supervision, picking object up from floor Min A     Time 4   Period Weeks   Status Partially Met     Additional Short Term Goals   Additional Short Term Goals Yes     PT SHORT TERM GOAL #6   Title Pt will be able to ambulate 500' with SPC including grass and unlevel surfaces with supervision (New  Target Date: 06/11/2016)   Baseline Met 7/24.   Time 1   Period Months   Status Achieved     PT SHORT TERM GOAL #7   Title Pt will be able to negotiate ramp, curb and stairs with one rail with [facility] with supervision (New Target Date: 06/11/2016)   Baseline Met 7/24.   Time 1   Status Achieved           PT Long Term Goals - 07/08/16 2671      PT LONG TERM GOAL #1   Title Patient verbalizes & demonstrates understanding of prosthetic care to enable safe use of prosthesis. (Target Date: 05/16/2016)   Baseline Met 05/12/16   Time 8   Period Weeks   Status Achieved     PT LONG TERM GOAL #2   Title Patient tolerates wear of prosthesis >90% of awake hours without skin issues or limb tenderness to enable function throughout his awake hours.  (Target Date: 05/16/2016)   Baseline Met 05/12/16   Time 8   Period Weeks   Status Achieved     PT LONG TERM GOAL #3   Title Berg Balance Test > 42/56 to indicate lower fall risk.  (New Target Date: 07/11/2016)   Baseline MET 07/07/2016 Berg Balance 51 /56   Time 2   Period Months   Status Achieved     PT LONG TERM GOAL #4   Title Patient ambulates 800' with LRAD & prosthesis outside including grass, ramps & curbs modified independent for community access.   (New Target Date: 07/11/2016)   Baseline MET 07/08/2016 with single point cane & prosthesis for >500' and no device <500'   Time 2   Period Months   Status Achieved     PT LONG TERM GOAL #5   Title Patient ambulates around furniture carrrying plate & cup with only prosthesis independently.  (New Target Date: 07/11/2016)   Baseline MET 07/08/2016   Time 2   Period Months   Status Achieved     PT LONG TERM GOAL #6   Title Patient able  to transfer on/off ground and demonstrate yard work and golf swing with prosthesis modified independent to return to leisure activities.  (New Target Date: 08/25//2017)   Baseline MET 07/08/2016   Time 2   Period Months   Status Achieved     PT LONG TERM GOAL #7   Title Patient will increase Functional Gait Assessment 7/30 to >/= 19/30 to indicate decreased risk for falls (New Target Date 07/11/16)   Baseline Partial MET improved FGA from 7/30 to 14/30.    Time 2   Period Months   Status Partially Met               Plan - 07/08/16 2138    Clinical Impression Statement Patient met or partially met all LTGs. He is functioning with prosthesis at full community level with variable cadence. His residual limb has undergone typical changes with decreased volume. His socket is currently too large creating impaired control of prosthesis. His current prosthetic knee has only swing hydraulics and limits his mobility. Patient appears to be an excellcent candidate for a microprocessor prosthesis with C-leg to maximize his mobility.                   Rehab Potential Good   PT Frequency 2x / week   PT Duration Other (comment)  9 weeks (60 days)   PT Treatment/Interventions ADLs/Self Care Home Management;DME Instruction;Gait training;Stair  training;Functional mobility training;Therapeutic activities;Therapeutic exercise;Balance training;Neuromuscular re-education;Patient/family education;Prosthetic Training   PT Next Visit Plan discharge PT. When patient recieves a microprocessor prosthesis, he should be referred back to PT.    Consulted and Agree with Plan of Care Patient      Patient will benefit from skilled therapeutic intervention in order to improve the following deficits and impairments:  Abnormal gait, Decreased activity tolerance, Decreased balance, Decreased endurance, Decreased mobility, Postural dysfunction, Prosthetic Dependency  Visit Diagnosis: Other abnormalities of gait and  mobility  Unsteadiness on feet  Other symptoms and signs involving the musculoskeletal system       G-Codes - 2016-07-20 02-03-41    Functional Assessment Tool Used Functional Gait Assessment 14/30   Functional Limitation Mobility: Walking and moving around   Mobility: Walking and Moving Around Goal Status [phone]) At least 20 percent but less than 40 percent impaired, limited or restricted   Mobility: Walking and Moving Around Discharge Status [phone]) At least 40 percent but less than 60 percent impaired, limited or restricted      Problem List Patient Active Problem List   Diagnosis Date Noted  . Type 2 diabetes with complication ([place]) 65/80/0634  . Phantom pain after amputation of lower extremity ([place]) 01/18/2016  . Neuropathic pain   . Above knee amputation of right lower extremity ([place]) 12/29/2015  . PAD (peripheral artery disease) ([place]) 12/15/2015  . Hyperlipidemia associated with type 2 diabetes mellitus ([place]) 12/07/2015  . Hypertension associated with diabetes ([place]) 12/04/2014    PHYSICAL THERAPY DISCHARGE SUMMARY  Visits from Start of Care: 31  Current functional level related to goals / functional outcomes: See above   Remaining deficits: See above   Education / Equipment: Prosthetic care & HEP/ fitness plan  Plan: Patient agrees to discharge.  Patient goals were met. Patient is being discharged due to meeting the stated rehab goals.  ?????         Jamey Reas PT, DPT 07-20-16, 9:44 PM  [facility] [address] [place], [place], [address] Phone: [phone]   Fax:  [phone]  Name: JORRYN CASAGRANDE MRN: [id number] Date of Birth: 04-05-1951

## 2016-07-08 NOTE — Patient Instructions (Signed)
1. Treadmill - step on & off with good leg. Hold with arms but lighten up lifting. Use music to help rhythm  A. Work on walking at various speeds with fluency.   B. At medium speed working on turning your head right / left and up/down. 2. Follow  Up treadmill with overground walking trying to carryover fluency. Work on keeping leg in, not kicked out. Use music to walk with rhythm.  A. Walk with no device  B. Walking sticks  C. Walking straight with head turns 3. Counter  A. Step & turn 90* walk along counter  B. Work on golf swing to right pushing off left leg

## 2016-07-09 ENCOUNTER — Encounter: Payer: No Typology Code available for payment source | Admitting: Physical Therapy

## 2016-07-14 ENCOUNTER — Ambulatory Visit: Payer: No Typology Code available for payment source | Admitting: Physical Therapy

## 2016-07-16 ENCOUNTER — Encounter: Payer: No Typology Code available for payment source | Admitting: Physical Therapy

## 2016-07-23 ENCOUNTER — Encounter: Payer: No Typology Code available for payment source | Admitting: Physical Therapy

## 2016-07-25 ENCOUNTER — Encounter: Payer: No Typology Code available for payment source | Admitting: Physical Therapy

## 2016-07-28 ENCOUNTER — Encounter: Payer: No Typology Code available for payment source | Admitting: Physical Therapy

## 2016-07-30 ENCOUNTER — Encounter: Payer: No Typology Code available for payment source | Admitting: Physical Therapy

## 2016-08-04 ENCOUNTER — Encounter: Payer: No Typology Code available for payment source | Admitting: Physical Therapy

## 2016-08-06 ENCOUNTER — Encounter: Payer: No Typology Code available for payment source | Admitting: Physical Therapy

## 2016-09-24 ENCOUNTER — Other Ambulatory Visit: Payer: Self-pay | Admitting: Family Medicine

## 2016-09-24 NOTE — Telephone Encounter (Signed)
Go ahead and renew this but make sure he has a follow-up diabetes appointment

## 2016-09-24 NOTE — Telephone Encounter (Signed)
Left message for pt to make appointment for his diabetes

## 2016-09-24 NOTE — Telephone Encounter (Signed)
Is this okay to refill? 

## 2016-10-01 ENCOUNTER — Ambulatory Visit (INDEPENDENT_AMBULATORY_CARE_PROVIDER_SITE_OTHER): Payer: Medicare Other | Admitting: Family Medicine

## 2016-10-01 ENCOUNTER — Encounter: Payer: Self-pay | Admitting: Family Medicine

## 2016-10-01 VITALS — BP 200/100 | HR 92 | Ht 71.0 in | Wt 176.0 lb

## 2016-10-01 DIAGNOSIS — E1169 Type 2 diabetes mellitus with other specified complication: Secondary | ICD-10-CM

## 2016-10-01 DIAGNOSIS — E1159 Type 2 diabetes mellitus with other circulatory complications: Secondary | ICD-10-CM | POA: Diagnosis not present

## 2016-10-01 DIAGNOSIS — Z23 Encounter for immunization: Secondary | ICD-10-CM

## 2016-10-01 DIAGNOSIS — L309 Dermatitis, unspecified: Secondary | ICD-10-CM

## 2016-10-01 DIAGNOSIS — M792 Neuralgia and neuritis, unspecified: Secondary | ICD-10-CM

## 2016-10-01 DIAGNOSIS — I70261 Atherosclerosis of native arteries of extremities with gangrene, right leg: Secondary | ICD-10-CM

## 2016-10-01 DIAGNOSIS — I10 Essential (primary) hypertension: Secondary | ICD-10-CM | POA: Diagnosis not present

## 2016-10-01 DIAGNOSIS — S78111A Complete traumatic amputation at level between right hip and knee, initial encounter: Secondary | ICD-10-CM

## 2016-10-01 DIAGNOSIS — I739 Peripheral vascular disease, unspecified: Secondary | ICD-10-CM | POA: Diagnosis not present

## 2016-10-01 DIAGNOSIS — Z89611 Acquired absence of right leg above knee: Secondary | ICD-10-CM | POA: Diagnosis not present

## 2016-10-01 DIAGNOSIS — E785 Hyperlipidemia, unspecified: Secondary | ICD-10-CM | POA: Diagnosis not present

## 2016-10-01 LAB — LIPID PANEL
CHOL/HDL RATIO: 2.7 ratio (ref <5.0)
CHOLESTEROL: 158 mg/dL (ref <200)
HDL: 58 mg/dL (ref >40)
LDL Cholesterol: 57 mg/dL (ref <100)
Triglycerides: 216 mg/dL — ABNORMAL HIGH (ref <150)
VLDL: 43 mg/dL — AB (ref <30)

## 2016-10-01 LAB — COMPREHENSIVE METABOLIC PANEL
ALT: 23 U/L (ref 9–46)
AST: 20 U/L (ref 10–35)
Albumin: 4.6 g/dL (ref 3.6–5.1)
Alkaline Phosphatase: 44 U/L (ref 40–115)
BUN: 22 mg/dL (ref 7–25)
CHLORIDE: 105 mmol/L (ref 98–110)
CO2: 27 mmol/L (ref 20–31)
Calcium: 10 mg/dL (ref 8.6–10.3)
Creat: 1 mg/dL (ref 0.70–1.25)
GLUCOSE: 141 mg/dL — AB (ref 65–99)
POTASSIUM: 4.1 mmol/L (ref 3.5–5.3)
Sodium: 141 mmol/L (ref 135–146)
Total Bilirubin: 0.5 mg/dL (ref 0.2–1.2)
Total Protein: 7 g/dL (ref 6.1–8.1)

## 2016-10-01 LAB — CBC WITH DIFFERENTIAL/PLATELET
BASOS ABS: 64 {cells}/uL (ref 0–200)
BASOS PCT: 1 %
EOS ABS: 128 {cells}/uL (ref 15–500)
Eosinophils Relative: 2 %
HCT: 43.1 % (ref 38.5–50.0)
Hemoglobin: 15.1 g/dL (ref 13.2–17.1)
LYMPHS PCT: 23 %
Lymphs Abs: 1472 cells/uL (ref 850–3900)
MCH: 32.3 pg (ref 27.0–33.0)
MCHC: 35 g/dL (ref 32.0–36.0)
MCV: 92.3 fL (ref 80.0–100.0)
MONOS PCT: 7 %
MPV: 9.5 fL (ref 7.5–12.5)
Monocytes Absolute: 448 cells/uL (ref 200–950)
NEUTROS PCT: 67 %
Neutro Abs: 4288 cells/uL (ref 1500–7800)
PLATELETS: 260 10*3/uL (ref 140–400)
RBC: 4.67 MIL/uL (ref 4.20–5.80)
RDW: 13.8 % (ref 11.0–15.0)
WBC: 6.4 10*3/uL (ref 4.0–10.5)

## 2016-10-01 LAB — POCT GLYCOSYLATED HEMOGLOBIN (HGB A1C): Hemoglobin A1C: 5.3

## 2016-10-01 NOTE — Progress Notes (Signed)
Subjective:    Patient ID: Adam [name], male    DOB: 11-15-1951, 65 y.o.   MRN: [id number]  Adam [name] is a 65 y.o. male who presents for follow-up of Type 2 diabetes mellitus.  Patient is not checking home blood sugars.  He states his machine is not functioning properly. Home blood sugar records:  How often is blood sugars being checked: not Current symptoms/problems machine broke Daily foot checks: yes  Any foot concerns: none Last eye exam: 2 years ago Exercise: gym tread mill very active He seems be doing quite nicely with his prosthetic but does indicate that he has fallen on several occasions. His walking is fairly good but he does walk with an antalgic type gait due to the stasis. This does interfere with his physical activities. He does have underlying OSA and is on CPAP. He does use this regularly and gets good results with this. He is also noted difficulty with blood pressure being elevated especially when he goes into an office. He has a rash present on his abdomen he would like evaluated. The phantom pain has diminished considerably. The following portions of the patient's history were reviewed and updated as appropriate: allergies, current medications, past medical history, past social history and problem list.  ROS as in subjective above.     Objective:    Physical Exam Alert and in no distress.Red slightly dry round lesion is noted distal to the umbilicus. No other lesions are seen.   Lab Review Diabetic Labs Latest Ref Rng & Units 12/31/2015 12/28/2015 12/27/2015 12/26/2015 12/24/2015  HbA1c 4.8 - 5.6 % - - - - -  Chol 125 - 200 mg/dL - - - - -  HDL >=40 mg/dL - - - - -  Calc LDL <130 mg/dL - - - - -  Triglycerides <150 mg/dL - - - - -  Creatinine 0.61 - 1.24 mg/dL 0.64 0.69 0.65 0.70 0.82   BP/Weight 04/18/2016 03/06/2016 02/20/2016 0000000 [id number]  Systolic BP 123XX123 0000000 - [id number] 0000000  Diastolic BP 90 93 - 123XX123 80  Wt. (Lbs) 169 169 162 - 163  BMI 23.58 23.58 22.6 -  22.74   [name]  reports that he quit smoking about 4 years ago. His smoking use included Cigarettes. He has a 30.00 pack-year smoking history. He has never used smokeless tobacco. He reports that he drinks about 4.2 oz of alcohol per week . He reports that he does not use drugs. 1C is 5.3    Assessment & Plan:    Need for prophylactic vaccination and inoculation against influenza - Plan: Flu vaccine HIGH DOSE PF (Fluzone High dose)  Need for prophylactic vaccination against Streptococcus pneumoniae (pneumococcus) - Plan: Pneumococcal conjugate vaccine 13-valent  Hyperlipidemia associated with type 2 diabetes mellitus ([place]) - Plan: Lipid panel, HgB A1c  Above knee amputation of right lower extremity ([place])  Neuropathic pain  Hypertension associated with diabetes ([place]) - Plan: CBC with Differential/Platelet, Comprehensive metabolic panel  PAD (peripheral artery disease) ([place]) - Plan: Lipid panel, CBC with Differential/Platelet, Comprehensive metabolic panel  Dermatitis   1. Rx changes: Recommend OTC cortisone for the skin lesion. 2. Education: Reviewed 'ABCs' of diabetes management (respective goals in parentheses):  A1C (<7), blood pressure (<130/80), and cholesterol (LDL <100). 3. Compliance at present is estimated to be excellent. Efforts to improve compliance (if necessary) will be directed at No change. 4. Follow up: 6 months Overall he seems be doing quite nicely although his blood pressure is elevated. This  could easily be white coat hypertension. He is to get a blood pressure cuff, bring it in here to measured against ours. We will adjust blood pressure medication accordingly. His A1c's have been doing quite nicely which makes me wonder whether the original A1c of 7.1 was truly accurate and possibly lab error. I explained that in spite of this we still need to keep his blood pressure and cholesterol numbers in a good range due to his underlying peripheral vascular disease. He states  that he see in the process of trying to get a better stasis and I will work with him concerning this.

## 2016-11-03 ENCOUNTER — Encounter: Payer: Self-pay | Admitting: Family Medicine

## 2016-11-03 ENCOUNTER — Ambulatory Visit (INDEPENDENT_AMBULATORY_CARE_PROVIDER_SITE_OTHER): Payer: Medicare Other | Admitting: Family Medicine

## 2016-11-03 VITALS — BP 170/100 | HR 77 | Wt 169.0 lb

## 2016-11-03 DIAGNOSIS — E1159 Type 2 diabetes mellitus with other circulatory complications: Secondary | ICD-10-CM | POA: Diagnosis not present

## 2016-11-03 DIAGNOSIS — I70261 Atherosclerosis of native arteries of extremities with gangrene, right leg: Secondary | ICD-10-CM

## 2016-11-03 DIAGNOSIS — I10 Essential (primary) hypertension: Secondary | ICD-10-CM | POA: Diagnosis not present

## 2016-11-03 MED ORDER — AMLODIPINE BESYLATE 10 MG PO TABS: 10.0000 mg | ORAL_TABLET | Freq: Every day | ORAL | 1 refills | Status: DC

## 2016-11-03 NOTE — Progress Notes (Signed)
   Subjective:    Patient ID: Adam [name], male    DOB: [birth year], 65 y.o.   MRN: [id number]  HPI He is here for recheck on his blood pressure. He presently is on losartan/HCTZ. He has been checking his blood pressure at home and his machine is accurate.   Review of Systems     Objective:   Physical Exam Alert and in no distress. Blood pressure is recorded.       Assessment & Plan:  Hypertension associated with diabetes ([place]) I will add amlodipine to his regimen. He is to call me in one month and let me know how it is working.

## 2016-12-16 ENCOUNTER — Ambulatory Visit (INDEPENDENT_AMBULATORY_CARE_PROVIDER_SITE_OTHER): Payer: Medicare Other | Admitting: Vascular Surgery

## 2016-12-16 ENCOUNTER — Encounter: Payer: Self-pay | Admitting: Vascular Surgery

## 2016-12-16 VITALS — BP 182/86 | HR 80 | Temp 98.1°F | Resp 16 | Ht 71.0 in | Wt 183.0 lb

## 2016-12-16 DIAGNOSIS — Z89611 Acquired absence of right leg above knee: Secondary | ICD-10-CM

## 2016-12-16 NOTE — Progress Notes (Signed)
[facility] [place]  Patient name: Adam [name] MRN: [id number] DOB: 1951/11/13 Sex: male  REASON FOR VISIT: Discuss potential new prosthesis  HPI: Adam [name] is a 66 y.o. male here today for follow-up. Status post right above-knee amputation on 12/26/2015. He had failed revascularization attempt by our practice and underwent amputation by myself. He is done quite well with rehabilitation. He is walking with an above-knee prosthesis. He does use a cane to assist with walking. He has fallen on several occasions related to his prosthetic locking up. He is quite active and travels through airports and wishes to be more active.  Past Medical History:  Diagnosis Date  . Gangrene ([place]) 11/22/2015   right great toe  . Hypertension   . OSA on CPAP   . PAD (peripheral artery disease) ([place])   . Peripheral vascular disease ([place])   . Type II diabetes mellitus ([place]) dx'd 11/2015    Family History  Problem Relation Age of Onset  . Hypertension Mother     SOCIAL HISTORY: Social History  Substance Use Topics  . Smoking status: Former Smoker    Packs/day: 0.75    Years: 40.00    Types: Cigarettes    Quit date: 10/06/2011  . Smokeless tobacco: Never Used  . Alcohol use 4.2 oz/week    7 Glasses of wine per week    No Known Allergies  Current Outpatient Prescriptions  Medication Sig Dispense Refill  . amLODipine (NORVASC) 10 MG tablet Take 1 tablet (10 mg total) by mouth daily. 30 tablet 1  . aspirin 81 MG tablet Take 81 mg by mouth daily.    Marland Kitchen atorvastatin (LIPITOR) 10 MG tablet Take 1 tablet (10 mg total) by mouth daily. (Patient taking differently: Take 10 mg by mouth daily. Pt takes three times a week) 90 tablet 3  . escitalopram (LEXAPRO) 10 MG tablet TAKE 1 TABLET BY MOUTH EVERY DAY 90 tablet 0  . losartan-hydrochlorothiazide (HYZAAR) 100-12.5 MG tablet TAKE 1 TABLET BY MOUTH DAILY. 90 tablet 3  . Multiple Vitamin  (MULTIVITAMIN) tablet Take 1 tablet by mouth daily.    . ONE TOUCH ULTRA TEST test strip      No current facility-administered medications for this visit.     REVIEW OF SYSTEMS:  [X]  denotes positive finding, [ ]  denotes negative finding Cardiac  Comments:  Chest pain or chest pressure:    Shortness of breath upon exertion:    Short of breath when lying flat:    Irregular heart rhythm:        Vascular    Pain in calf, thigh, or hip brought on by ambulation:    Pain in feet at night that wakes you up from your sleep:     Blood clot in your veins:    Leg swelling:           PHYSICAL EXAM: Vitals:   12/16/16 0936  BP: (!) 182/86  Pulse: 80  Resp: 16  Temp: 98.1 F (36.7 C)  TempSrc: Oral  SpO2: 98%  Weight: 183 lb (83 kg)  Height: 5\' 11"  (1.803 m)    GENERAL: The patient is a well-nourished male, in no acute distress. The vital signs are documented above. CARDIOVASCULAR: Prosthetic in place. Did not examine his stump PULMONARY: There is good air exchange  MUSCULOSKELETAL: There are no major deformities or cyanosis. NEUROLOGIC: No focal weakness or paresthesias are detected. SKIN: There are no ulcers or rashes noted. PSYCHIATRIC: The patient  has a normal affect.    MEDICAL ISSUES: Adam [name] is currently wearing a prosthesis that has had dry leg knee and energy storing foot. 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 He currently ambulates with no assistive devices except occasionally a cane. Patient also reports while wearing his current hydraulic knee, he has fallen several times, 3-4 times a month on level ground has had to catch himself to present additional falls with a regular basis. He is ambulating at [place] 3 level which would allow him to ambulate with variable cadence and on uneven terrain. He has full range of motion of his upper extremities and full strength  I feel Mr.  [name] is a good candidate for being fit with a new above-knee prosthesis with suction socket, micro-processing the technology and energy storing foot. This will allow Adam [name] to return to a more productive and active lifestyle that he is accustomed to. The patient is a very motivated individual who likes to stay active. His hobbies are going to the gym, playing golf and he also spends a great deal of time on his boat and doing water sports. Once fit with a new prosthesis, I feel that the patient will be able to achieve all of his goals. A new prosthesis we'll also allow patient to ambulate more safely and with more confidence.  We'll also recommend more prosthetic gait training with physical therapy to assist him with reaching his full potential as a K 3 level prosthetic ambulator using the new prosthesis with microprocessing technologist    [name], MD FACS [facility] Tel [phone] Pager [phone]

## 2016-12-19 ENCOUNTER — Other Ambulatory Visit: Payer: Self-pay | Admitting: Family Medicine

## 2016-12-19 DIAGNOSIS — E785 Hyperlipidemia, unspecified: Principal | ICD-10-CM

## 2016-12-19 DIAGNOSIS — E1169 Type 2 diabetes mellitus with other specified complication: Secondary | ICD-10-CM

## 2016-12-24 ENCOUNTER — Other Ambulatory Visit: Payer: Self-pay | Admitting: Family Medicine

## 2016-12-24 NOTE — Telephone Encounter (Signed)
Is this okay to refill? 

## 2016-12-31 ENCOUNTER — Other Ambulatory Visit: Payer: Self-pay | Admitting: Family Medicine

## 2017-02-05 ENCOUNTER — Telehealth: Payer: Self-pay | Admitting: Family Medicine

## 2017-02-05 NOTE — Telephone Encounter (Signed)
Pt forwarded me a email concerning physical therapy. Sending back for JCL to review.

## 2017-02-09 ENCOUNTER — Other Ambulatory Visit: Payer: Self-pay | Admitting: Family Medicine

## 2017-02-09 DIAGNOSIS — Z89611 Acquired absence of right leg above knee: Secondary | ICD-10-CM

## 2017-02-25 ENCOUNTER — Encounter: Payer: Self-pay | Admitting: Physical Therapy

## 2017-02-25 ENCOUNTER — Ambulatory Visit: Payer: Medicare Other | Attending: Family Medicine | Admitting: Physical Therapy

## 2017-02-25 DIAGNOSIS — R2689 Other abnormalities of gait and mobility: Secondary | ICD-10-CM | POA: Insufficient documentation

## 2017-02-25 DIAGNOSIS — R29898 Other symptoms and signs involving the musculoskeletal system: Secondary | ICD-10-CM | POA: Insufficient documentation

## 2017-02-25 DIAGNOSIS — Z89611 Acquired absence of right leg above knee: Secondary | ICD-10-CM | POA: Diagnosis not present

## 2017-02-25 DIAGNOSIS — R2681 Unsteadiness on feet: Secondary | ICD-10-CM | POA: Diagnosis not present

## 2017-02-25 NOTE — Therapy (Signed)
[facility] [address] [place], [place], [address] Phone: [phone]   Fax:  [phone]  Physical Therapy Evaluation  Patient Details  Name: Adam [name] MRN: [id number] Date of Birth: 25-Aug-1951 Referring Provider: Jill Alexanders, MD  Encounter Date: 02/25/2017      PT End of Session - 02/25/17 2152    Visit Number 1   Number of Visits 9   Date for PT Re-Evaluation 04/24/17   Authorization Type Medicare G-code & progress note every 10th visit   PT Start Time 0805   PT Stop Time 0843   PT Time Calculation (min) 38 min   Activity Tolerance Patient tolerated treatment well   Behavior During Therapy [facility] for tasks assessed/performed      Past Medical History:  Diagnosis Date  . Gangrene ([place]) 11/22/2015   right great toe  . Hypertension   . OSA on CPAP   . PAD (peripheral artery disease) ([place])   . Peripheral vascular disease ([place])   . Type II diabetes mellitus ([place]) dx'd 11/2015    Past Surgical History:  Procedure Laterality Date  . AMPUTATION Right 12/01/2015   Procedure: AMPUTATION RIGHT GREAT;  Surgeon: Conrad [place], MD;  Location: [facility];  Service: Vascular;  Laterality: Right;  . AMPUTATION Right 12/16/2015   Procedure: AMPUTATION BELOW KNEE;  Surgeon: [name], MD;  Location: [facility];  Service: Vascular;  Laterality: Right;  . AMPUTATION Right 12/26/2015   Procedure: RIGHT ABOVE KNEE AMPUTATION;  Surgeon: [name], MD;  Location: [facility];  Service: Vascular;  Laterality: Right;  . COLONOSCOPY    . FEMORAL-POPLITEAL BYPASS GRAFT Right 11/28/2015   Procedure: RIGHT  FEMORAL-POPLITEAL ARTERY BYPASS GRAFT AND CONSTRUCTION OF MILLER CUFF USING 6 MM X 80 CM PROPATEN  GRAFT ;  Surgeon: Conrad [place], MD;  Location: [facility];  Service: Vascular;  Laterality: Right;  . INGUINAL HERNIA REPAIR Bilateral 2000  . PERIPHERAL VASCULAR CATHETERIZATION N/A 11/26/2015   Procedure: Abdominal Aortogram;  Surgeon: [name], MD;  Location: [facility];  Service: Cardiovascular;  Laterality: N/A;  . VASECTOMY      There were no vitals filed for this visit.       Subjective Assessment - 02/25/17 0809    Subjective This 66yo male underwent a right Transfemoral Amputaton on 12/26/2015 from failed femoral-popliteal bypass surgery. He recieved his first prosthesis 03/18/2016 and underwent PT for prosthetic training thru 07/08/2016. He recieved his first microprocessor (2nd prosthesis) prosthesis on 02/06/2017. He is dependent in use & function with microprocessor unit. Patient presents to PT for evaluation wearing prosthesis and ambulating without device with deviations.    Pertinent History right TFA 12/26/2015, DM2, HTN, PVD/PAD   Limitations Lifting;Standing;Walking;House hold activities   Patient Stated Goals Use new microprocessor prosthesis walking on uneven terrain including golf.    Currently in Pain? No/denies            [facility] PT Assessment - 02/25/17 0805      Assessment   Medical Diagnosis Right Transfemoral Amputation   Referring Provider Jill Alexanders, MD   Onset Date/Surgical Date 02/06/17  microprocessor prosthesis delivery   Hand Dominance Right     Precautions   Precautions Fall     Balance Screen   Has the patient fallen in the past 6 months Yes   How many times? multiple with old prosthesis, 3 with new   Has the patient had a decrease in activity level  because of a fear of falling?  No   Is the patient reluctant to leave their home because of a fear of falling?  No     Home Environment   Living Environment Private residence   Living Arrangements Spouse/significant other   Type of [place] to enter   Entrance Stairs-Number of Steps 3   Entrance Stairs-Rails Right;Left;Can reach both   [place] Multi-level;1/2 bath on main level   Alternate Level Stairs-Number of Steps 16   Alternate Level Stairs-Rails Left   Home Equipment Crutches;Walker - 2  wheels     Prior Function   Level of Independence Independent   Vocation Full time employment   Armed forces technical officer rep: traveling, [place], trade shows some pushing, pulling   Leisure golf     Observation/Other Assessments   Focus on Therapeutic Outcomes (FOTO)  70.6% Functional Status   Activities of Balance Confidence Scale (ABC Scale)  70.6%   Fear Avoidance Belief Questionnaire (FABQ)  19 (5)     Posture/Postural Control   Posture/Postural Control Postural limitations   Postural Limitations Forward head;Flexed trunk;Weight shift left     ROM / Strength   AROM / PROM / Strength AROM;Strength     AROM   Overall AROM  Within functional limits for tasks performed     Strength   Overall Strength Within functional limits for tasks performed     Transfers   Transfers Sit to Stand;Stand to Sit   Stand to Sit 5: Supervision;Without upper extremity assist;To chair/3-in-1  with cues on engaging prosthetic knee     Ambulation/Gait   Ambulation/Gait Yes   Ambulation/Gait Assistance 5: Supervision   Ambulation Distance (Feet) 300 Feet   Assistive device Prosthesis   Gait Pattern Step-through pattern;Decreased arm swing - right;Decreased step length - left;Decreased stance time - right;Decreased stride length;Decreased hip/knee flexion - right;Right circumduction;Right hip hike;Antalgic;Trunk rotated posteriorly on right;Abducted- right   Ambulation Surface Indoor;Level   Gait velocity 2.53 ft/sec comfortable & 2.99 ft/sec fast pace   Stairs Yes   Stairs Assistance 5: Supervision   Stairs Assistance Details (indicate cue type and reason) demo, tactile & verbal cues on engaging prosthetic knee with descending   Stair Management Technique One rail Left;Step to pattern;Forwards   Number of Stairs 4   Ramp 4: Min assist  prosthesis only   Curb 5: Supervision  prosthesis only     Standardized Balance Assessment   Standardized Balance Assessment Berg Balance Test;Timed Up and  Go Test     Berg Balance Test   Sit to Stand Able to stand  independently using hands  No UE requires multiple attempts   Standing Unsupported Able to stand safely 2 minutes   Sitting with Back Unsupported but Feet Supported on Floor or Stool Able to sit safely and securely 2 minutes   Stand to Sit Sits safely with minimal use of hands   Transfers Able to transfer safely, minor use of hands   Standing Unsupported with Eyes Closed Able to stand 10 seconds safely   Standing Ubsupported with Feet Together Able to place feet together independently and stand for 1 minute with supervision   From Standing, Reach Forward with Outstretched Arm Can reach forward >12 cm safely (5")   From Standing Position, Pick up Object from Floor Able to pick up shoe, needs supervision   From Standing Position, Turn to Look Behind Over each Shoulder Looks behind one side only/other side shows less weight  shift   Turn 360 Degrees Able to turn 360 degrees safely but slowly   Standing Unsupported, Alternately Place Feet on Step/Stool Able to complete >2 steps/needs minimal assist   Standing Unsupported, One Foot in Front Able to take small step independently and hold 30 seconds   Standing on One Leg Able to lift leg independently and hold equal to or more than 3 seconds   Total Score 42     Functional Gait  Assessment   Gait assessed  Yes   Gait Level Surface Walks 20 ft, slow speed, abnormal gait pattern, evidence for imbalance or deviates 10-15 in outside of the 12 in walkway width. Requires more than 7 sec to ambulate 20 ft.   Change in Gait Speed Able to change speed, demonstrates mild gait deviations, deviates 6-10 in outside of the 12 in walkway width, or no gait deviations, unable to achieve a major change in velocity, or uses a change in velocity, or uses an assistive device.   Gait with Horizontal Head Turns Performs head turns smoothly with slight change in gait velocity (eg, minor disruption to smooth gait  path), deviates 6-10 in outside 12 in walkway width, or uses an assistive device.   Gait with Vertical Head Turns Performs task with slight change in gait velocity (eg, minor disruption to smooth gait path), deviates 6 - 10 in outside 12 in walkway width or uses assistive device   Gait and Pivot Turn Pivot turns safely in greater than 3 sec and stops with no loss of balance, or pivot turns safely within 3 sec and stops with mild imbalance, requires small steps to catch balance.   Step Over Obstacle Is able to step over one shoe box (4.5 in total height) but must slow down and adjust steps to clear box safely. May require verbal cueing.   Gait with Narrow Base of Support Ambulates less than 4 steps heel to toe or cannot perform without assistance.   Gait with Eyes Closed Walks 20 ft, slow speed, abnormal gait pattern, evidence for imbalance, deviates 10-15 in outside 12 in walkway width. Requires more than 9 sec to ambulate 20 ft.   Ambulating Backwards Walks 20 ft, slow speed, abnormal gait pattern, evidence for imbalance, deviates 10-15 in outside 12 in walkway width.   Steps Two feet to a stair, must use rail.   Total Score 13         Prosthetics Assessment - 02/25/17 0805      Prosthetics   Prosthetic Care Independent with Skin check;Residual limb care;Prosthetic cleaning;Proper wear schedule/adjustment;Proper weight-bearing schedule/adjustment   Donning prosthesis  Modified independent (Device/Increase time)   Doffing prosthesis  Modified independent (Device/Increase time)   Current prosthetic wear tolerance (days/week)  daily   Current prosthetic wear tolerance (#hours/day)  all awake hours   Current prosthetic weight-bearing tolerance (hours/day)  tolerates 30 min of standing without c/o pain or discomfort   Residual limb condition  reports intact   K code/activity level with prosthetic use  K3 full community with variable cadence                  [facility] Adult PT  Treatment/Exercise - 02/25/17 0805      Transfers   Sit to Stand 5: Supervision;Without upper extremity assist;From chair/3-in-1  cues on engaging prosthesis   Sit to Stand Details (indicate cue type and reason) verbal & demo cues on engaging prosthetic knee  PT Short Term Goals - 05-Mar-2017 02/22/03      PT SHORT TERM GOAL #1   Title Patient able to ambulate with prosthesis only with head turns to scan environment maintaining path & pace.  (Target Date: 03/27/2017)   Time 4   Period Weeks   Status New     PT SHORT TERM GOAL #2   Title Patient descends stairs reciprocally with 2 rails with verbal cues.   (Target Date: 03/27/2017)   Time 4   Period Weeks   Status New     PT SHORT TERM GOAL #3   Title Patient able to pick up objects from floor engaging hydraulics on prosthetic unit.   (Target Date: 03/27/2017)   Time 4   Period Weeks   Status New           PT Long Term Goals - 03-05-2017 2200      PT LONG TERM GOAL #1   Title Patient verbalizes & demonstrates understanding of prosthetic care / use of microprocessor unit to enable safe use of prosthesis. (Target Date: 04/24/2017)   Time 8   Period Weeks   Status New     PT LONG TERM GOAL #2   Title Functional Gait Assessment > 19/ 30 to indicate lower fall risk with gait.  (Target Date: 04/24/2017)   Time 8   Period Weeks   Status New     PT LONG TERM GOAL #3   Title Berg Balance Test >/= 52/56 to indicate lower fall risk. (Target Date: 04/24/2017)   Time 8   Period Weeks   Status New     PT LONG TERM GOAL #4   Title Patient ambulates 1000' with micorprocessor prosthesis outside including grass, ramps & curbs independent for community access.  (Target Date: 04/24/2017)   Time 8   Period Weeks   Status New     PT LONG TERM GOAL #5   Title Patient able to increase gait velocity >1.0 ft/sec comfortable to fast pace.  (Target Date: 04/24/2017)   Time 8   Period Weeks   Status New                Plan - Mar 05, 2017 Feb 22, 2152    Clinical Impression Statement Patient recently recieved new prosthesis with microprocessor hydraulic knee. He is dependent in care & use of new prosthetic unit. Patient has gait deviations impairing balance & energy expenditure. Functional Gait Assessment 13/30 indicates fall risk with gait. Gait velocity comfortable pace is 2.53 ft/sec and fast pace 2.99 ft/sec for increase of 0.46 ft/sec. Berg Balance 42/56 indicates high fall risk. Patient's condition is stable & plan of care is moderate complexity. Patient would benefit from skilled PT to improve balance & mobility with microprocessor knee.    Rehab Potential Good   PT Frequency 1x / week   PT Duration 8 weeks   PT Treatment/Interventions ADLs/Self Care Home Management;Gait training;Stair training;Functional mobility training;Therapeutic activities;Therapeutic exercise;Balance training;Neuromuscular re-education;Patient/family education;Prosthetic Training   PT Next Visit Plan instruct in stand to sit, stairs and prosthetic gait   Consulted and Agree with Plan of Care Patient      Patient will benefit from skilled therapeutic intervention in order to improve the following deficits and impairments:  Abnormal gait, Decreased balance, Decreased endurance, Postural dysfunction, Prosthetic Dependency, Decreased mobility  Visit Diagnosis: Other abnormalities of gait and mobility  Unsteadiness on feet  Other symptoms and signs involving the musculoskeletal system      G-Codes - 03/05/17 02/21/2205  Functional Assessment Tool Used (Outpatient Only) Functional Gait Assessment 14/30   Functional Limitation Mobility: Walking and moving around   Mobility: Walking and Moving Around Current Status [phone]) At least 40 percent but less than 60 percent impaired, limited or restricted   Mobility: Walking and Moving Around Goal Status [phone]) At least 20 percent but less than 40 percent impaired, limited or restricted        Problem List Patient Active Problem List   Diagnosis Date Noted  . Type 2 diabetes with complication ([place]) 63/33/5456  . Phantom pain after amputation of lower extremity ([place]) 01/18/2016  . Neuropathic pain   . S/P AKA (above knee amputation), right ([place]) 12/29/2015  . PAD (peripheral artery disease) ([place]) 12/15/2015  . Hyperlipidemia associated with type 2 diabetes mellitus ([place]) 12/07/2015  . Hypertension associated with diabetes ([place]) 12/04/2014    Chozen Latulippe PT, DPT 02/26/2017, 11:04 AM  [facility] [address] [place], [place], [address] Phone: [phone]   Fax:  [phone]  Name: Adam [name] MRN: [id number] Date of Birth: 1951/10/07

## 2017-03-01 ENCOUNTER — Other Ambulatory Visit: Payer: Self-pay | Admitting: Family Medicine

## 2017-03-02 ENCOUNTER — Ambulatory Visit: Payer: Medicare Other | Admitting: Physical Therapy

## 2017-03-03 ENCOUNTER — Telehealth: Payer: Self-pay | Admitting: Family Medicine

## 2017-03-03 NOTE — Telephone Encounter (Signed)
Called 2 times and left a message stating that pt needs a medicare well visit to call the office back

## 2017-03-04 ENCOUNTER — Encounter: Payer: Medicare Other | Admitting: Physical Therapy

## 2017-03-06 ENCOUNTER — Encounter: Payer: Self-pay | Admitting: Family Medicine

## 2017-03-12 ENCOUNTER — Ambulatory Visit: Payer: Medicare Other | Admitting: Physical Therapy

## 2017-03-12 ENCOUNTER — Encounter: Payer: Self-pay | Admitting: Physical Therapy

## 2017-03-12 DIAGNOSIS — R2681 Unsteadiness on feet: Secondary | ICD-10-CM | POA: Diagnosis not present

## 2017-03-12 DIAGNOSIS — R29898 Other symptoms and signs involving the musculoskeletal system: Secondary | ICD-10-CM | POA: Diagnosis not present

## 2017-03-12 DIAGNOSIS — Z89611 Acquired absence of right leg above knee: Secondary | ICD-10-CM | POA: Diagnosis not present

## 2017-03-12 DIAGNOSIS — S78111A Complete traumatic amputation at level between right hip and knee, initial encounter: Secondary | ICD-10-CM

## 2017-03-12 DIAGNOSIS — R2689 Other abnormalities of gait and mobility: Secondary | ICD-10-CM | POA: Diagnosis not present

## 2017-03-12 NOTE — Therapy (Signed)
[facility] [address] [place], [place], [address] Phone: [phone]   Fax:  [phone]  Physical Therapy Treatment  Patient Details  Name: Adam [name] MRN: [id number] Date of Birth: 01-16-1951 Referring Provider: Jill Alexanders, MD  Encounter Date: 03/12/2017      PT End of Session - 03/12/17 1430    Visit Number 2   Number of Visits 9   Date for PT Re-Evaluation 04/24/17   Authorization Type Medicare G-code & progress note every 10th visit   PT Start Time 0846   PT Stop Time 0930   PT Time Calculation (min) 44 min   Activity Tolerance Patient tolerated treatment well   Behavior During Therapy [facility] for tasks assessed/performed      Past Medical History:  Diagnosis Date  . Gangrene ([place]) 11/22/2015   right great toe  . Hypertension   . OSA on CPAP   . PAD (peripheral artery disease) ([place])   . Peripheral vascular disease ([place])   . Type II diabetes mellitus ([place]) dx'd 11/2015    Past Surgical History:  Procedure Laterality Date  . AMPUTATION Right 12/01/2015   Procedure: AMPUTATION RIGHT GREAT;  Surgeon: Conrad Hayward, MD;  Location: [facility];  Service: Vascular;  Laterality: Right;  . AMPUTATION Right 12/16/2015   Procedure: AMPUTATION BELOW KNEE;  Surgeon: [name], MD;  Location: [facility];  Service: Vascular;  Laterality: Right;  . AMPUTATION Right 12/26/2015   Procedure: RIGHT ABOVE KNEE AMPUTATION;  Surgeon: [name], MD;  Location: [facility];  Service: Vascular;  Laterality: Right;  . COLONOSCOPY    . FEMORAL-POPLITEAL BYPASS GRAFT Right 11/28/2015   Procedure: RIGHT  FEMORAL-POPLITEAL ARTERY BYPASS GRAFT AND CONSTRUCTION OF MILLER CUFF USING 6 MM X 80 CM PROPATEN  GRAFT ;  Surgeon: Conrad Kaka, MD;  Location: [facility];  Service: Vascular;  Laterality: Right;  . INGUINAL HERNIA REPAIR Bilateral 2000  . PERIPHERAL VASCULAR CATHETERIZATION N/A 11/26/2015   Procedure: Abdominal Aortogram;  Surgeon: [name], MD;  Location: [facility];  Service: Cardiovascular;  Laterality: N/A;  . VASECTOMY      There were no vitals filed for this visit.      Subjective Assessment - 03/12/17 0849    Subjective Patient denies any new complaints, pain, or falls since last visit.    Pertinent History right TFA 12/26/2015, DM2, HTN, PVD/PAD   Limitations Lifting;Standing;Walking;House hold activities   Patient Stated Goals Use new microprocessor prosthesis walking on uneven terrain including golf.    Currently in Pain? No/denies                         [facility] Adult PT Treatment/Exercise - 03/12/17 0846      Transfers   Transfers Sit to Stand;Stand to Sit   Sit to Stand 5: Supervision;From chair/3-in-1;With armrests   Sit to Stand Details Verbal cues for technique   Sit to Stand Details (indicate cue type and reason) requires cueing for technique and weight shfit (specifically to keep weight centered NOT shifted to L side)   Stand to Sit 5: Supervision;To chair/3-in-1;With armrests   Stand to Sit Details (indicate cue type and reason) Verbal cues for technique   Stand to Sit Details requires cueing for sequencing, proper weight shift (specifically to keep weight central NOT shifted to the L), technique, and to reach for armrest with R hand to encourage proper weight shift. Requires these  cues to foster engagement of the hydraulic knee   Comments Patient requires cueing for proper prosthesis engagement during sit to stand and stand to sit transfers     Ambulation/Gait   Ambulation/Gait Yes   Ambulation/Gait Assistance 5: Supervision;4: Min assist   Ambulation/Gait Assistance Details Verbal, visual & tactile cues for terminal stance knee flexion with proper step width & step length.    Ambulation Distance (Feet) 170 Feet   Assistive device Prosthesis   Gait Pattern Step-through pattern;Decreased arm swing - right;Decreased step length - left;Decreased stance time -  right;Decreased stride length;Decreased hip/knee flexion - right;Right circumduction;Right hip hike;Antalgic;Trunk rotated posteriorly on right;Abducted- right   Ambulation Surface Level;Indoor   Gait velocity --   Stairs Yes   Stairs Assistance 5: Supervision   Stairs Assistance Details (indicate cue type and reason) Today PT worked with patient on engaging his hydraulic knee with descending stairs by leading with his LLE. Patient requries cueing for sequencing and timing to step with RLE/prosthesis.   Stair Management Technique One rail Left;Step to pattern;Forwards   Number of Stairs 4   Ramp --   Curb --     Posture/Postural Control   Posture/Postural Control Postural limitations   Postural Limitations Forward head;Flexed trunk;Weight shift left     Neuro Re-ed    Neuro Re-ed Details  Performed reaching to the floor activities to work on proper weight shift and engagement of prosthesis. Requires cueing for technique and weight shift to appropriately and safely engage hydraulic knee. In parallel bars: performed weight shift in a forward step position (prosthesis located just posterior/lateral to LLE's heel). Patient able to perform following PT's demonstration and cueing for proper weight shift and technique.      Prosthetics   Prosthetic Care Comments  Patient educated considerations with shoe wear with prosthesis with respect to decreasing his risk of falling due to shoe wear. Educated on "adjust a lift" to decrease the slight posterior pitch on his prosthetic shoe.   Current prosthetic wear tolerance (days/week)  daily   Current prosthetic wear tolerance (#hours/day)  all awake hours   Current prosthetic weight-bearing tolerance (hours/day)  --   Residual limb condition  denies any skin integrity issues    Education Provided Other (comment)  proper shoe wear and lift adjustments as needed   Person(s) Educated Patient   Education Method Explanation;Demonstration;Tactile cues;Verbal  cues   Education Method Verbalized understanding;Verbal cues required                PT Education - 03/12/17 1429    Education provided Yes   Education Details HEP (see patient instructions)    Person(s) Educated Patient   Methods Explanation;Demonstration;Tactile cues;Verbal cues;Handout   Comprehension Verbalized understanding;Returned demonstration          PT Short Term Goals - 03/12/17 1440      PT SHORT TERM GOAL #1   Title Patient able to ambulate with prosthesis only with head turns to scan environment maintaining path & pace.  (Target Date: 03/27/2017)   Time 4   Period Weeks   Status On-going     PT SHORT TERM GOAL #2   Title Patient descends stairs reciprocally with 2 rails with verbal cues.   (Target Date: 03/27/2017)   Time 4   Period Weeks   Status On-going     PT SHORT TERM GOAL #3   Title Patient able to pick up objects from floor engaging hydraulics on prosthetic unit.   (Target Date: 03/27/2017)  Time 4   Period Weeks   Status On-going     PT SHORT TERM GOAL #4   Status On-going     PT SHORT TERM GOAL #5   Status On-going           PT Long Term Goals - 03/12/17 1440      PT LONG TERM GOAL #1   Title Patient verbalizes & demonstrates understanding of prosthetic care / use of microprocessor unit to enable safe use of prosthesis. (Target Date: 04/24/2017)   Time 8   Period Weeks   Status On-going     PT LONG TERM GOAL #2   Title Functional Gait Assessment > 19/ 30 to indicate lower fall risk with gait.  (Target Date: 04/24/2017)   Time 8   Period Weeks   Status On-going     PT LONG TERM GOAL #3   Title Oceanographer Test >/= 52/56 to indicate lower fall risk. (Target Date: 04/24/2017)   Time 8   Period Weeks   Status On-going     PT LONG TERM GOAL #4   Title Patient ambulates 1000' with micorprocessor prosthesis outside including grass, ramps & curbs independent for community access.  (Target Date: 04/24/2017)   Time 8   Period Weeks    Status On-going     PT LONG TERM GOAL #5   Title Patient able to increase gait velocity >1.0 ft/sec comfortable to fast pace.  (Target Date: 04/24/2017)   Time 8   Period Weeks   Status On-going               Plan - 03/12/17 1430    Clinical Impression Statement PT worked with patient today on training to engage his the hydraulic knee via descending stairs, anterior weight shift, sit to stand, stand to sit, and retrieving objects from the floor. Patient requires demonstration and cueing for technique and proper weight shift. Patient is proressing towards goals, and will benefit from continued skilled PT to address deficits.   Rehab Potential Good   PT Frequency 1x / week   PT Duration 8 weeks   PT Treatment/Interventions ADLs/Self Care Home Management;Gait training;Stair training;Functional mobility training;Therapeutic activities;Therapeutic exercise;Balance training;Neuromuscular re-education;Patient/family education;Prosthetic Training   PT Next Visit Plan advance balance and prosthetic gait activities    Consulted and Agree with Plan of Care Patient      Patient will benefit from skilled therapeutic intervention in order to improve the following deficits and impairments:  Abnormal gait, Decreased balance, Decreased endurance, Postural dysfunction, Prosthetic Dependency, Decreased mobility  Visit Diagnosis: Other abnormalities of gait and mobility  Unsteadiness on feet  Other symptoms and signs involving the musculoskeletal system  Above knee amputation of right lower extremity [facility])     Problem List Patient Active Problem List   Diagnosis Date Noted  . Type 2 diabetes with complication ([place]) 28/76/8115  . Phantom pain after amputation of lower extremity ([place]) 01/18/2016  . Neuropathic pain   . S/P AKA (above knee amputation), right ([place]) 12/29/2015  . PAD (peripheral artery disease) ([place]) 12/15/2015  . Hyperlipidemia associated with type 2 diabetes mellitus  ([place]) 12/07/2015  . Hypertension associated with diabetes ([place]) 12/04/2014    Jamey Reas, PT, DPT PT Specializing in [place] 03/12/17 6:59 PM Phone:  [phone]  Fax:  [phone] [facility] [address] [place], [place] [address]   [place], [place]  03/12/2017, [place] [address]  [place], [place], [address] Phone: [phone]   Fax:  [phone]  Name: Adam [name] MRN: [id number] Date of Birth: [birth year]

## 2017-03-12 NOTE — Patient Instructions (Signed)
1. (a) Sit to stand using R hand only, keeping your weight centered, and pulling back with residual limb.  (b) Stand to sit unlock knee slightly, keep weight R, reach back with R arm to control your descent.  (c) Picking up light weight items from the floor. Lowering to get the object is the same as sitting down. Standing up is the same technique as performing a sit to stand. Have a chair behind you.   2. Stairs: descend using 2 rails. Step down with R leg with half of the foot off the edge of the stair with knee slightly bent with hips over knees. As R knee begins to bend, step off with L foot.   3. Standing with inside of your heels 2-4 inches apart with prosthetic toe back to the L heel. Keep your weight centered, roll forward off of the prosthetic toe allowing it to bed. Practice engaging this prosthetic knee.

## 2017-03-18 ENCOUNTER — Encounter: Payer: Self-pay | Admitting: Physical Therapy

## 2017-03-18 ENCOUNTER — Ambulatory Visit: Payer: Medicare Other | Attending: Family Medicine | Admitting: Physical Therapy

## 2017-03-18 DIAGNOSIS — R29898 Other symptoms and signs involving the musculoskeletal system: Secondary | ICD-10-CM | POA: Insufficient documentation

## 2017-03-18 DIAGNOSIS — R2681 Unsteadiness on feet: Secondary | ICD-10-CM | POA: Insufficient documentation

## 2017-03-18 DIAGNOSIS — R2689 Other abnormalities of gait and mobility: Secondary | ICD-10-CM | POA: Diagnosis not present

## 2017-03-18 DIAGNOSIS — S78111A Complete traumatic amputation at level between right hip and knee, initial encounter: Secondary | ICD-10-CM

## 2017-03-18 DIAGNOSIS — Z89611 Acquired absence of right leg above knee: Secondary | ICD-10-CM | POA: Diagnosis not present

## 2017-03-18 NOTE — Therapy (Signed)
[facility] [address] [place], [place], [address] Phone: [phone]   Fax:  [phone]  Physical Therapy Treatment  Patient Details  Name: Adam [name] MRN: [id number] Date of Birth: 1951/04/18 Referring Provider: Jill Alexanders, MD  Encounter Date: 03/18/2017      PT End of Session - 03/18/17 1146    Visit Number 3   Number of Visits 9   Date for PT Re-Evaluation 04/24/17   Authorization Type Medicare G-code & progress note every 10th visit   PT Start Time 1103   PT Stop Time 1149   PT Time Calculation (min) 46 min   Activity Tolerance Patient tolerated treatment well   Behavior During Therapy [facility] for tasks assessed/performed      Past Medical History:  Diagnosis Date  . Gangrene ([place]) 11/22/2015   right great toe  . Hypertension   . OSA on CPAP   . PAD (peripheral artery disease) ([place])   . Peripheral vascular disease ([place])   . Type II diabetes mellitus ([place]) dx'd 11/2015    Past Surgical History:  Procedure Laterality Date  . AMPUTATION Right 12/01/2015   Procedure: AMPUTATION RIGHT GREAT;  Surgeon: Conrad [place], MD;  Location: [facility];  Service: Vascular;  Laterality: Right;  . AMPUTATION Right 12/16/2015   Procedure: AMPUTATION BELOW KNEE;  Surgeon: [name], MD;  Location: [facility];  Service: Vascular;  Laterality: Right;  . AMPUTATION Right 12/26/2015   Procedure: RIGHT ABOVE KNEE AMPUTATION;  Surgeon: [name], MD;  Location: [facility];  Service: Vascular;  Laterality: Right;  . COLONOSCOPY    . FEMORAL-POPLITEAL BYPASS GRAFT Right 11/28/2015   Procedure: RIGHT  FEMORAL-POPLITEAL ARTERY BYPASS GRAFT AND CONSTRUCTION OF MILLER CUFF USING 6 MM X 80 CM PROPATEN  GRAFT ;  Surgeon: Conrad [place], MD;  Location: [facility];  Service: Vascular;  Laterality: Right;  . INGUINAL HERNIA REPAIR Bilateral 2000  . PERIPHERAL VASCULAR CATHETERIZATION N/A 11/26/2015   Procedure: Abdominal Aortogram;  Surgeon: [name], MD;  Location: [facility];  Service: Cardiovascular;  Laterality: N/A;  . VASECTOMY      There were no vitals filed for this visit.      Subjective Assessment - 03/18/17 1105    Subjective Patient reports he has L hip pain that started while doing yard work yesterday (03/17/2017). Patient reports he went to the beach and did stairs while on vacation. He reports he is completing is HEP routinely.    Pertinent History right TFA 12/26/2015, DM2, HTN, PVD/PAD   Limitations Lifting;Standing;Walking;House hold activities   Patient Stated Goals Use new microprocessor prosthesis walking on uneven terrain including golf.                          [facility] Adult PT Treatment/Exercise - 03/18/17 1103      Transfers   Transfers Sit to Stand;Stand to Sit   Sit to Stand From chair/3-in-1;With armrests;5: Supervision   Sit to Stand Details Verbal cues for technique   Stand to Sit 5: Supervision;To chair/3-in-1;With armrests   Stand to Sit Details (indicate cue type and reason) Verbal cues for technique   Comments --     Ambulation/Gait   Ambulation/Gait Yes   Ambulation/Gait Assistance 5: Supervision;4: Min assist   Ambulation/Gait Assistance Details Patient ambulated on treadmill and requies cueing to narrow base of support via visual and verbal cueing and to focus on loading  his toe in terminal stance in ambulation. PT cued patient for 1 minute on narrowing base of support, then cued for 1 minute cueing for loading the toe in terminal stance (focused on one gait mechanic at a time, alternating the focus of the stepping and cueing from PT). Started at a speed of 1.20mph. Progressed to speed of 1.10mph, then 1.50mph. Pateint required heavy BUE support on treadmill. PT educated patient on safety on a treadmill (stepping onto/off treadmill, starting the treadmill/first steps on moving belt).    Ambulation Distance (Feet) 100 Feet  0.12 miles on treadmill; 100 on level surface in  clinic x 2   Assistive device Prosthesis   Gait Pattern Step-through pattern;Decreased arm swing - right;Decreased step length - left;Decreased stance time - right;Decreased stride length;Decreased hip/knee flexion - right;Right circumduction;Right hip hike;Antalgic;Trunk rotated posteriorly on right;Abducted- right   Stairs Yes   Stairs Assistance 5: Supervision   Stairs Assistance Details (indicate cue type and reason) Patient requires verbal and tactile cueing for proper sequencing, timing, and weight shift to properly engage hydraulic knee. Began with step to gait pattern, then progressed to alternating pattern with visual cues for heel and toe placement, and to maintain a narrow base of support.   Stair Management Technique Step to pattern;Forwards;Two rails;Alternating pattern   Number of Stairs 4     Posture/Postural Control   Posture/Postural Control Postural limitations   Postural Limitations Forward head;Flexed trunk;Weight shift left     Neuro Re-ed    Neuro Re-ed Details  Retrieving box from floor: patient required demonstration and cueing for foot placement, weight shift, and technique to properly engage hydraulic knee in bending activity while maintaining balance. Began exercise with mat behind the patient for safety, and progressed to environment without posterior support.       Prosthetics   Prosthetic Care Comments  Patient denies any new complaints or changes with prosthesis.    Current prosthetic wear tolerance (days/week)  daily   Current prosthetic wear tolerance (#hours/day)  all awake hours   Residual limb condition  Patient denies any skin integrity issues    Education Provided --                PT Education - 03/18/17 1138    Education provided Yes   Education Details continuing previous HEP plan with addition of stair (patient educated to practice stairs at home with step to pattern, then transition to practicing reciprocal gait pattern on last 4 steps);  practice golf chipping motion in garage with focus of engaging hydraulic knee, educated patient to discuss following things with prosthetist Mortimer Fries): bike mode, using the app, and possible out toeing on prosthesis   Person(s) Educated Patient   Methods Explanation;Demonstration;Tactile cues;Verbal cues   Comprehension Verbalized understanding;Returned demonstration;Verbal cues required          PT Short Term Goals - 03/12/17 1440      PT SHORT TERM GOAL #1   Title Patient able to ambulate with prosthesis only with head turns to scan environment maintaining path & pace.  (Target Date: 03/27/2017)   Time 4   Period Weeks   Status On-going     PT SHORT TERM GOAL #2   Title Patient descends stairs reciprocally with 2 rails with verbal cues.   (Target Date: 03/27/2017)   Time 4   Period Weeks   Status On-going     PT SHORT TERM GOAL #3   Title Patient able to pick up objects from floor engaging hydraulics  on prosthetic unit.   (Target Date: 03/27/2017)   Time 4   Period Weeks   Status On-going     PT SHORT TERM GOAL #4   Status On-going     PT SHORT TERM GOAL #5   Status On-going           PT Long Term Goals - 03/12/17 1440      PT LONG TERM GOAL #1   Title Patient verbalizes & demonstrates understanding of prosthetic care / use of microprocessor unit to enable safe use of prosthesis. (Target Date: 04/24/2017)   Time 8   Period Weeks   Status On-going     PT LONG TERM GOAL #2   Title Functional Gait Assessment > 19/ 30 to indicate lower fall risk with gait.  (Target Date: 04/24/2017)   Time 8   Period Weeks   Status On-going     PT LONG TERM GOAL #3   Title Oceanographer Test >/= 52/56 to indicate lower fall risk. (Target Date: 04/24/2017)   Time 8   Period Weeks   Status On-going     PT LONG TERM GOAL #4   Title Patient ambulates 1000' with micorprocessor prosthesis outside including grass, ramps & curbs independent for community access.  (Target Date: 04/24/2017)   Time  8   Period Weeks   Status On-going     PT LONG TERM GOAL #5   Title Patient able to increase gait velocity >1.0 ft/sec comfortable to fast pace.  (Target Date: 04/24/2017)   Time 8   Period Weeks   Status On-going               Plan - 03/18/17 1147    Clinical Impression Statement Today's skilled PT session focused on advancing prosthetic gait on treadmill at various speeds, proper weight shift and hydraulic knee engagement when lifting a box from the floor, and descending stairs. Patient requires demonstration, verbal and tactile cueing for sequencing and weight shift to properly engage hydraulic knee. PT also utilized visual cues on stairs. Patient is making progress towards goals,and will benefit from continued skilled PT to address deficits.    Rehab Potential Good   PT Frequency 1x / week   PT Duration 8 weeks   PT Treatment/Interventions ADLs/Self Care Home Management;Gait training;Stair training;Functional mobility training;Therapeutic activities;Therapeutic exercise;Balance training;Neuromuscular re-education;Patient/family education;Prosthetic Training   PT Next Visit Plan Check STGs, advance balance and prosthetic gait activities    Consulted and Agree with Plan of Care Patient      Patient will benefit from skilled therapeutic intervention in order to improve the following deficits and impairments:  Abnormal gait, Decreased balance, Decreased endurance, Postural dysfunction, Prosthetic Dependency, Decreased mobility  Visit Diagnosis: Other abnormalities of gait and mobility  Unsteadiness on feet  Other symptoms and signs involving the musculoskeletal system  Above knee amputation of right lower extremity [facility])     Problem List Patient Active Problem List   Diagnosis Date Noted  . Type 2 diabetes with complication ([place]) 38/08/1750  . Phantom pain after amputation of lower extremity ([place]) 01/18/2016  . Neuropathic pain   . S/P AKA (above knee amputation),  right ([employer]) 12/29/2015  . PAD (peripheral artery disease) ([place]) 12/15/2015  . Hyperlipidemia associated with type 2 diabetes mellitus ([place]) 12/07/2015  . Hypertension associated with diabetes ([place]) 12/04/2014   Jamey Reas, PT, DPT PT Specializing in [place] 03/18/17 10:16 PM Phone:  [phone]  Fax:  [phone] [place]  [place], [place] [address]   [place], [place]  03/18/2017, 12:54 PM  [facility] [address] [place], [place], [address] Phone: [phone]   Fax:  [phone]  Name: Adam [name] MRN: [id number] Date of Birth: 04-10-1951

## 2017-03-24 ENCOUNTER — Other Ambulatory Visit: Payer: Self-pay | Admitting: Dermatology

## 2017-03-24 DIAGNOSIS — L259 Unspecified contact dermatitis, unspecified cause: Secondary | ICD-10-CM | POA: Diagnosis not present

## 2017-03-24 DIAGNOSIS — D485 Neoplasm of uncertain behavior of skin: Secondary | ICD-10-CM | POA: Diagnosis not present

## 2017-03-24 DIAGNOSIS — D0359 Melanoma in situ of other part of trunk: Secondary | ICD-10-CM | POA: Diagnosis not present

## 2017-03-25 ENCOUNTER — Encounter: Payer: Self-pay | Admitting: Physical Therapy

## 2017-03-25 ENCOUNTER — Ambulatory Visit: Payer: Medicare Other | Admitting: Physical Therapy

## 2017-03-25 DIAGNOSIS — R29898 Other symptoms and signs involving the musculoskeletal system: Secondary | ICD-10-CM | POA: Diagnosis not present

## 2017-03-25 DIAGNOSIS — R2689 Other abnormalities of gait and mobility: Secondary | ICD-10-CM

## 2017-03-25 DIAGNOSIS — R2681 Unsteadiness on feet: Secondary | ICD-10-CM | POA: Diagnosis not present

## 2017-03-25 DIAGNOSIS — Z89611 Acquired absence of right leg above knee: Secondary | ICD-10-CM | POA: Diagnosis not present

## 2017-03-25 NOTE — Patient Instructions (Addendum)
Practice descending steps with 1 rail + cane. Keep the cane on the same step as the prosthesis.   Practice steps with 2 rails (from 4th step to bottom of staircase): alternate feet you're stepping down with while making sure to reach for the step with your L toe and to reach forward/out with heel of prosthesis.    On incline: use walking poles and practice stepping forward with L foot. Starting position: prosthetic heel in line with L toes and walking poles placed in front of you on the level surface. Engage hydraulic knee and then step with L foot. Roll off prosthetic toe to make knee bend. When stepping back onto the incline, step back with L foot (do NOT turn around).     Retrieving objects from floor: when bending down think about "sitting in a chair" to properly engage hydraulic knee. Then when returning to stand, think about "standing from a chair"

## 2017-03-25 NOTE — Therapy (Signed)
[facility] [address] [place], [place], [address] Phone: [phone]   Fax:  [phone]  Physical Therapy Treatment  Patient Details  Name: Adam [name] MRN: [id number] Date of Birth: 12-04-1950 Referring Provider: Jill Alexanders, MD  Encounter Date: 03/25/2017      PT End of Session - 03/25/17 1258    Visit Number 4   Number of Visits 9   Date for PT Re-Evaluation 04/24/17   Authorization Type Medicare G-code & progress note every 10th visit   PT Start Time 1100   PT Stop Time 1148   PT Time Calculation (min) 48 min   Activity Tolerance Patient tolerated treatment well   Behavior During Therapy [facility] for tasks assessed/performed      Past Medical History:  Diagnosis Date  . Gangrene ([place]) 11/22/2015   right great toe  . Hypertension   . OSA on CPAP   . PAD (peripheral artery disease) ([place])   . Peripheral vascular disease ([place])   . Type II diabetes mellitus ([place]) dx'd 11/2015    Past Surgical History:  Procedure Laterality Date  . AMPUTATION Right 12/01/2015   Procedure: AMPUTATION RIGHT GREAT;  Surgeon: Conrad Tualatin, MD;  Location: [facility];  Service: Vascular;  Laterality: Right;  . AMPUTATION Right 12/16/2015   Procedure: AMPUTATION BELOW KNEE;  Surgeon: [name], MD;  Location: [facility];  Service: Vascular;  Laterality: Right;  . AMPUTATION Right 12/26/2015   Procedure: RIGHT ABOVE KNEE AMPUTATION;  Surgeon: [name], MD;  Location: [facility];  Service: Vascular;  Laterality: Right;  . COLONOSCOPY    . FEMORAL-POPLITEAL BYPASS GRAFT Right 11/28/2015   Procedure: RIGHT  FEMORAL-POPLITEAL ARTERY BYPASS GRAFT AND CONSTRUCTION OF MILLER CUFF USING 6 MM X 80 CM PROPATEN  GRAFT ;  Surgeon: Conrad , MD;  Location: [facility];  Service: Vascular;  Laterality: Right;  . INGUINAL HERNIA REPAIR Bilateral 2000  . PERIPHERAL VASCULAR CATHETERIZATION N/A 11/26/2015   Procedure: Abdominal Aortogram;  Surgeon: [name], MD;  Location: [facility];  Service: Cardiovascular;  Laterality: N/A;  . VASECTOMY      There were no vitals filed for this visit.      Subjective Assessment - 03/25/17 1104    Subjective Patient denies any new complaints, changes or falls since last visit. 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 Patient reports he has been compliant with HEP. Patient reports he has an appointment with [employer].   Pertinent History right TFA 12/26/2015, DM2, HTN, PVD/PAD   Limitations Lifting;Standing;Walking;House hold activities   Patient Stated Goals Use new microprocessor prosthesis walking on uneven terrain including golf.    Currently in Pain? No/denies                         [facility] Adult PT Treatment/Exercise - 03/25/17 1100      Transfers   Transfers Sit to Stand;Stand to Sit   Sit to Stand From chair/3-in-1;With armrests;5: Supervision   Sit to Stand Details Verbal cues for technique   Sit to Stand Details (indicate cue type and reason) requires cueing for proper technique and weight shift    Stand to Sit 5: Supervision;To chair/3-in-1;With armrests   Stand to Sit Details (indicate cue type and reason) Verbal cues for technique   Stand to Sit Details requires cueing for technique for proper weight shift in order to consistently properly engagem hydraulic  knee     Ambulation/Gait   Ambulation/Gait Yes   Ambulation/Gait Assistance 5: Supervision   Ambulation Distance (Feet) 125 Feet   Assistive device Prosthesis   Gait Pattern Step-through pattern;Decreased arm swing - right;Decreased step length - left;Decreased stance time - right;Decreased stride length;Decreased hip/knee flexion - right;Right circumduction;Right hip hike;Antalgic;Trunk rotated posteriorly on right;Abducted- right   Ambulation Surface Level;Indoor   Stairs Yes   Stairs Assistance 5: Supervision   Stairs Assistance Details (indicate cue type and reason) require  demonstration and cueing for proper sequencing and weight shift to engage hydraulic knee with proper timing. PT started with patient utilizing 2 rails, and progressed to 1 rail. When patient utilized 1 rail, patient must utilize cane for safety and to allow for proper weight shift to engage hydraulic knee. Patient requires demonstration and cueing for technique, weight shift, and proper engagement of hydraulic knee.   Stair Management Technique Step to pattern;Forwards;Two rails;Alternating pattern   Number of Stairs 4   Gait Comments With walking poles and supervision on incline: patient performed stepping from bottom of incline onto level surface in front of incline. Patient requires demonsration and cueing for technique, foot placement, and weight shift for proper engagement of hydraulic knee. Progressed to starting position midway up incline with PT providing min guard and tactile cueing to practice weight shift onto LLE to enage hydraulic knee. Progressed to starting position at bottom of incline with prosthetic foot in front of L foot (prosthetic heel even with L toes). Practiced stepping through from this position with L foot, followed by subsequent step with prosthesis (onto level surface).     Posture/Postural Control   Posture/Postural Control Postural limitations   Postural Limitations Forward head;Flexed trunk;Weight shift left     Neuro Re-ed    Neuro Re-ed Details  Patient worked on retrieving objects from the floor. Patient requires cueing for technique for consistent, proper engagement of hydraulic knee. The verbal cue "pretend you are sitting" to engage hydraulic knee to descend to retrieve object worked well for patient. In parallel bars: standing on 4 inch step while stepping down onto incline. Requires demonstration and cueing for technique, weight shift, and proper hydraulic knee engagement. With walking poles in a forward step position (L foot in front of prosthetic foot): patient  worked with PT on weight shifting foward to engage hydraulic knee. Requires verbal and tactile cueing for sequencing and weight shift.      Prosthetics   Prosthetic Care Comments  Patient denies any new complaints or changes with prosthesis.    Current prosthetic wear tolerance (days/week)  daily   Current prosthetic wear tolerance (#hours/day)  all awake hours   Residual limb condition  Patient denies any skin integrity issues                 PT Education - 03/25/17 1151    Education provided Yes   Education Details at [employer] appointment check  with [place] on how to use cycle mode, how to use the app, and to check for rotation, HEP     Person(s) Educated Patient   Methods Explanation;Demonstration;Tactile cues;Verbal cues;Handout   Comprehension Verbalized understanding;Returned demonstration          PT Short Term Goals - 03/25/17 1107      PT SHORT TERM GOAL #1   Title Patient able to ambulate with prosthesis only with head turns to scan environment maintaining path & pace.  (Target Date: 03/27/2017)   Baseline MET 03/25/2017  Time 4   Period Weeks   Status On-going     PT SHORT TERM GOAL #2   Title Patient descends stairs reciprocally with 2 rails with verbal cues.   (Target Date: 03/27/2017)   Baseline MET 03/25/2017    Time 4   Period Weeks   Status On-going     PT SHORT TERM GOAL #3   Title Patient able to pick up objects from floor engaging hydraulics on prosthetic unit.   (Target Date: 03/27/2017)   Baseline MET 03/25/2017    Time 4   Period Weeks   Status Achieved     PT SHORT TERM GOAL #4   Status On-going     PT SHORT TERM GOAL #5   Status On-going           PT Long Term Goals - 03/12/17 1440      PT LONG TERM GOAL #1   Title Patient verbalizes & demonstrates understanding of prosthetic care / use of microprocessor unit to enable safe use of prosthesis. (Target Date: 04/24/2017)   Time 8   Period Weeks   Status On-going     PT LONG TERM GOAL  #2   Title Functional Gait Assessment > 19/ 30 to indicate lower fall risk with gait.  (Target Date: 04/24/2017)   Time 8   Period Weeks   Status On-going     PT LONG TERM GOAL #3   Title Oceanographer Test >/= 52/56 to indicate lower fall risk. (Target Date: 04/24/2017)   Time 8   Period Weeks   Status On-going     PT LONG TERM GOAL #4   Title Patient ambulates 1000' with micorprocessor prosthesis outside including grass, ramps & curbs independent for community access.  (Target Date: 04/24/2017)   Time 8   Period Weeks   Status On-going     PT LONG TERM GOAL #5   Title Patient able to increase gait velocity >1.0 ft/sec comfortable to fast pace.  (Target Date: 04/24/2017)   Time 8   Period Weeks   Status On-going               Plan - 03/25/17 1301    Clinical Impression Statement Today's skilled PT session focused on assessing the patient's short term goals. The patient has met all of his short term goals, and is making good progress. PT worked with patient on consistent, proper engagement of his hydraulic prosthetic knee via retrieving objects from the floor, stairs, and stepping on an incline. Patient continues to require demonstration and cueing for technique and weight shift to accomplish proper activation of hydraulic knee. Patient will benefit from continued skilled PT to improve functional mobility deficits.    Rehab Potential Good   PT Frequency 1x / week   PT Duration 8 weeks   PT Treatment/Interventions ADLs/Self Care Home Management;Gait training;Stair training;Functional mobility training;Therapeutic activities;Therapeutic exercise;Balance training;Neuromuscular re-education;Patient/family education;Prosthetic Training   PT Next Visit Plan progress balance and prosthetic gait activities; check HEP compliance    Consulted and Agree with Plan of Care Patient      Patient will benefit from skilled therapeutic intervention in order to improve the following deficits and  impairments:  Abnormal gait, Decreased balance, Decreased endurance, Postural dysfunction, Prosthetic Dependency, Decreased mobility  Visit Diagnosis: Other abnormalities of gait and mobility  Unsteadiness on feet  Other symptoms and signs involving the musculoskeletal system     Problem List Patient Active Problem List   Diagnosis Date Noted  .  Type 2 diabetes with complication ([place]) 20/72/1828  . Phantom pain after amputation of lower extremity ([place]) 01/18/2016  . Neuropathic pain   . S/P AKA (above knee amputation), right ([place]) 12/29/2015  . PAD (peripheral artery disease) ([place]) 12/15/2015  . Hyperlipidemia associated with type 2 diabetes mellitus ([place]) 12/07/2015  . Hypertension associated with diabetes [facility]) 12/04/2014    Arelia Sneddon, SPT  03/25/2017, 1:06 PM  [facility] [address] [place], [place], [address] Phone: [phone]   Fax:  [phone]  Name: PHILIP KOTLYAR MRN: [id number] Date of Birth: 1951-01-18

## 2017-04-01 ENCOUNTER — Encounter: Payer: Medicare Other | Admitting: Physical Therapy

## 2017-04-02 ENCOUNTER — Encounter: Payer: Self-pay | Admitting: Physical Therapy

## 2017-04-02 ENCOUNTER — Ambulatory Visit: Payer: Medicare Other | Admitting: Physical Therapy

## 2017-04-02 DIAGNOSIS — Z89611 Acquired absence of right leg above knee: Secondary | ICD-10-CM | POA: Diagnosis not present

## 2017-04-02 DIAGNOSIS — R2689 Other abnormalities of gait and mobility: Secondary | ICD-10-CM

## 2017-04-02 DIAGNOSIS — R29898 Other symptoms and signs involving the musculoskeletal system: Secondary | ICD-10-CM | POA: Diagnosis not present

## 2017-04-02 DIAGNOSIS — R2681 Unsteadiness on feet: Secondary | ICD-10-CM | POA: Diagnosis not present

## 2017-04-03 NOTE — Therapy (Signed)
[facility] [address] [place], [place], [address] Phone: [phone]   Fax:  [phone]  Physical Therapy Treatment  Patient Details  Name: Adam [name] MRN: [id number] Date of Birth: [birth year] Referring Provider: Jill Alexanders, MD  Encounter Date: 04/02/2017      PT End of Session - 04/02/17 0923    Visit Number 5   Number of Visits 9   Date for PT Re-Evaluation 04/24/17   Authorization Type Medicare G-code & progress note every 10th visit   PT Start Time 0802   PT Stop Time 0845   PT Time Calculation (min) 43 min   Activity Tolerance Patient tolerated treatment well   Behavior During Therapy [facility] for tasks assessed/performed      Past Medical History:  Diagnosis Date  . Gangrene ([place]) 11/22/2015   right great toe  . Hypertension   . OSA on CPAP   . PAD (peripheral artery disease) ([place])   . Peripheral vascular disease ([place])   . Type II diabetes mellitus ([place]) dx'd 11/2015    Past Surgical History:  Procedure Laterality Date  . AMPUTATION Right 12/01/2015   Procedure: AMPUTATION RIGHT GREAT;  Surgeon: Conrad [place], MD;  Location: [facility];  Service: Vascular;  Laterality: Right;  . AMPUTATION Right 12/16/2015   Procedure: AMPUTATION BELOW KNEE;  Surgeon: [name], MD;  Location: [facility];  Service: Vascular;  Laterality: Right;  . AMPUTATION Right 12/26/2015   Procedure: RIGHT ABOVE KNEE AMPUTATION;  Surgeon: [name], MD;  Location: [facility];  Service: Vascular;  Laterality: Right;  . COLONOSCOPY    . FEMORAL-POPLITEAL BYPASS GRAFT Right 11/28/2015   Procedure: RIGHT  FEMORAL-POPLITEAL ARTERY BYPASS GRAFT AND CONSTRUCTION OF MILLER CUFF USING 6 MM X 80 CM PROPATEN  GRAFT ;  Surgeon: Conrad [place], MD;  Location: [facility];  Service: Vascular;  Laterality: Right;  . INGUINAL HERNIA REPAIR Bilateral 2000  . PERIPHERAL VASCULAR CATHETERIZATION N/A 11/26/2015   Procedure: Abdominal Aortogram;  Surgeon: [name], MD;  Location: [facility];  Service: Cardiovascular;  Laterality: N/A;  . VASECTOMY      There were no vitals filed for this visit.      Subjective Assessment - 04/02/17 0801    Subjective No falls. He has been working on activities prescribed by PT   Pertinent History right TFA 12/26/2015, DM2, HTN, PVD/PAD   Limitations Lifting;Standing;Walking;House hold activities   Patient Stated Goals Use new microprocessor prosthesis walking on uneven terrain including golf.    Currently in Pain? No/denies                         [facility] Adult PT Treatment/Exercise - 04/02/17 0800      Transfers   Transfers Sit to Stand;Stand to Sit   Sit to Stand 6: Modified independent (Device/Increase time);With armrests;From chair/3-in-1;With upper extremity assist   Sit to Stand Details --   Sit to Stand Details (indicate cue type and reason) Pt engaged prosthetic knee   Stand to Sit 6: Modified independent (Device/Increase time);With upper extremity assist;With armrests;To chair/3-in-1   Stand to Sit Details (indicate cue type and reason) --   Stand to Sit Details Pt engages hydraulic prosthetic knee     Ambulation/Gait   Ambulation/Gait Yes   Ambulation/Gait Assistance 5: Supervision   Ambulation/Gait Assistance Details mirror for visual feedback & verbal cues to flex prosthetic knee in terminal stance; progressed to  knee flexion during turns both right & left   Ambulation Distance (Feet) 500 Feet   Assistive device Prosthesis   Gait Pattern --   Ambulation Surface Indoor;Level   Stairs Yes   Stairs Assistance 5: Supervision   Stairs Assistance Details (indicate cue type and reason) Engaging hydraulics of prosthetic knee descending: initial step-to pattern with 2 rails then left rail/right cane to right rail/left cane; progressed to reciprocal with 2 rails.     Stair Management Technique Two rails;One rail Left;One rail Right;With cane;Step to  pattern;Forwards;Alternating pattern   Number of Stairs 4  >20 reps   Ramp 5: Supervision  walking sticks & prosthesis   Gait Comments --     Posture/Postural Control   Posture/Postural Control Postural limitations   Postural Limitations Forward head;Flexed trunk;Weight shift left     Therapeutic Activites    Therapeutic Activities Other Therapeutic Activities   Other Therapeutic Activities Pt goal to cycle with prosthesis: PT demo &  instructed in stationary bike, foot position with prosthesis. Pt return demo understanding.      Neuro Re-ed    Neuro Re-ed Details  --     Prosthetics   Prosthetic Care Comments  --   Current prosthetic wear tolerance (days/week)  daily   Current prosthetic wear tolerance (#hours/day)  all awake hours   Residual limb condition  Patient denies any skin integrity issues                   PT Short Term Goals - 04/02/17 0924      PT SHORT TERM GOAL #1   Title Patient able to ambulate with prosthesis only with head turns to scan environment maintaining path & pace.  (Target Date: 03/27/2017)   Baseline MET 03/25/2017    Time 4   Period Weeks   Status Achieved     PT SHORT TERM GOAL #2   Title Patient descends stairs reciprocally with 2 rails with verbal cues.   (Target Date: 03/27/2017)   Baseline MET 03/25/2017    Time 4   Period Weeks   Status Achieved     PT SHORT TERM GOAL #3   Title Patient able to pick up objects from floor engaging hydraulics on prosthetic unit.   (Target Date: 03/27/2017)   Baseline MET 03/25/2017    Time 4   Period Weeks   Status Achieved     PT SHORT TERM GOAL #4   Status On-going     PT SHORT TERM GOAL #5   Status On-going           PT Long Term Goals - 03/12/17 1440      PT LONG TERM GOAL #1   Title Patient verbalizes & demonstrates understanding of prosthetic care / use of microprocessor unit to enable safe use of prosthesis. (Target Date: 04/24/2017)   Time 8   Period Weeks   Status On-going      PT LONG TERM GOAL #2   Title Functional Gait Assessment > 19/ 30 to indicate lower fall risk with gait.  (Target Date: 04/24/2017)   Time 8   Period Weeks   Status On-going     PT LONG TERM GOAL #3   Title Oceanographer Test >/= 52/56 to indicate lower fall risk. (Target Date: 04/24/2017)   Time 8   Period Weeks   Status On-going     PT LONG TERM GOAL #4   Title Patient ambulates 1000' with micorprocessor prosthesis outside including grass, ramps &  curbs independent for community access.  (Target Date: 04/24/2017)   Time 8   Period Weeks   Status On-going     PT LONG TERM GOAL #5   Title Patient able to increase gait velocity >1.0 ft/sec comfortable to fast pace.  (Target Date: 04/24/2017)   Time 8   Period Weeks   Status On-going               Plan - 04/02/17 0924    Clinical Impression Statement Patient improved prosthetic knee control descending stairs with skilled instruction. Patient was able to flex prosthetic knee in gentle turns better to left than right and only at slow, focused pace.    Rehab Potential Good   PT Frequency 1x / week   PT Duration 8 weeks   PT Treatment/Interventions ADLs/Self Care Home Management;Gait training;Stair training;Functional mobility training;Therapeutic activities;Therapeutic exercise;Balance training;Neuromuscular re-education;Patient/family education;Prosthetic Training   PT Next Visit Plan progress balance and prosthetic gait activities;    Consulted and Agree with Plan of Care Patient      Patient will benefit from skilled therapeutic intervention in order to improve the following deficits and impairments:  Abnormal gait, Decreased balance, Decreased endurance, Postural dysfunction, Prosthetic Dependency, Decreased mobility  Visit Diagnosis: Other abnormalities of gait and mobility  Unsteadiness on feet  Other symptoms and signs involving the musculoskeletal system     Problem List Patient Active Problem List   Diagnosis  Date Noted  . Type 2 diabetes with complication ([place]) 34/94/9447  . Phantom pain after amputation of lower extremity ([place]) 01/18/2016  . Neuropathic pain   . S/P AKA (above knee amputation), right ([place]) 12/29/2015  . PAD (peripheral artery disease) ([place]) 12/15/2015  . Hyperlipidemia associated with type 2 diabetes mellitus ([place]) 12/07/2015  . Hypertension associated with diabetes ([place]) 12/04/2014    Cleburn Maiolo PT, DPT 04/03/2017, 9:27 AM  [facility] [address] [place], [place], [address] Phone: [phone]   Fax:  [phone]  Name: PARVIN STETZER MRN: [id number] Date of Birth: 1951/01/25

## 2017-04-08 ENCOUNTER — Other Ambulatory Visit: Payer: Self-pay | Admitting: Family Medicine

## 2017-04-08 ENCOUNTER — Encounter: Payer: Medicare Other | Admitting: Physical Therapy

## 2017-04-08 NOTE — Telephone Encounter (Signed)
Is this okay to refill? 

## 2017-04-09 ENCOUNTER — Encounter: Payer: Self-pay | Admitting: Physical Therapy

## 2017-04-09 ENCOUNTER — Ambulatory Visit: Payer: Medicare Other | Admitting: Physical Therapy

## 2017-04-09 DIAGNOSIS — R29898 Other symptoms and signs involving the musculoskeletal system: Secondary | ICD-10-CM | POA: Diagnosis not present

## 2017-04-09 DIAGNOSIS — Z89611 Acquired absence of right leg above knee: Secondary | ICD-10-CM | POA: Diagnosis not present

## 2017-04-09 DIAGNOSIS — R2689 Other abnormalities of gait and mobility: Secondary | ICD-10-CM

## 2017-04-09 DIAGNOSIS — R2681 Unsteadiness on feet: Secondary | ICD-10-CM | POA: Diagnosis not present

## 2017-04-09 NOTE — Therapy (Signed)
[facility] [address] [place], [place], [address] Phone: [phone]   Fax:  [phone]  Physical Therapy Treatment  Patient Details  Name: Adam [name] MRN: [id number] Date of Birth: 1951/05/26 Referring Provider: Jill Alexanders, MD  Encounter Date: 04/09/2017      PT End of Session - 04/09/17 1527    Visit Number 6   Number of Visits 9   Date for PT Re-Evaluation 04/24/17   Authorization Type Medicare G-code & progress note every 10th visit   PT Start Time 1100   PT Stop Time 1145   PT Time Calculation (min) 45 min   Activity Tolerance Patient tolerated treatment well   Behavior During Therapy [facility] for tasks assessed/performed      Past Medical History:  Diagnosis Date  . Gangrene ([place]) 11/22/2015   right great toe  . Hypertension   . OSA on CPAP   . PAD (peripheral artery disease) ([place])   . Peripheral vascular disease ([place])   . Type II diabetes mellitus ([place]) dx'd 11/2015    Past Surgical History:  Procedure Laterality Date  . AMPUTATION Right 12/01/2015   Procedure: AMPUTATION RIGHT GREAT;  Surgeon: Conrad Vernon, MD;  Location: [facility];  Service: Vascular;  Laterality: Right;  . AMPUTATION Right 12/16/2015   Procedure: AMPUTATION BELOW KNEE;  Surgeon: [name], MD;  Location: [facility];  Service: Vascular;  Laterality: Right;  . AMPUTATION Right 12/26/2015   Procedure: RIGHT ABOVE KNEE AMPUTATION;  Surgeon: [name], MD;  Location: [facility];  Service: Vascular;  Laterality: Right;  . COLONOSCOPY    . FEMORAL-POPLITEAL BYPASS GRAFT Right 11/28/2015   Procedure: RIGHT  FEMORAL-POPLITEAL ARTERY BYPASS GRAFT AND CONSTRUCTION OF MILLER CUFF USING 6 MM X 80 CM PROPATEN  GRAFT ;  Surgeon: Conrad El Tumbao, MD;  Location: [facility];  Service: Vascular;  Laterality: Right;  . INGUINAL HERNIA REPAIR Bilateral 2000  . PERIPHERAL VASCULAR CATHETERIZATION N/A 11/26/2015   Procedure: Abdominal Aortogram;  Surgeon: [name], MD;  Location: [facility];  Service: Cardiovascular;  Laterality: N/A;  . VASECTOMY      There were no vitals filed for this visit.      Subjective Assessment - 04/09/17 1104    Subjective Patient denies any new changes or falls. Patient reports he plans to visit prosthetist at [place] to adjust hydraulic knee resistance next week.    Pertinent History right TFA 12/26/2015, DM2, HTN, PVD/PAD   Limitations Lifting;Standing;Walking;House hold activities   Patient Stated Goals Use new microprocessor prosthesis walking on uneven terrain including golf.    Currently in Pain? No/denies                         [facility] Adult PT Treatment/Exercise - 04/09/17 1100      Transfers   Transfers Sit to Stand;Stand to Sit   Sit to Stand 6: Modified independent (Device/Increase time);With upper extremity assist;With armrests;From chair/3-in-1   Stand to Sit 6: Modified independent (Device/Increase time);With upper extremity assist;With armrests;To chair/3-in-1     Ambulation/Gait   Ambulation/Gait Yes   Ambulation/Gait Assistance 5: Supervision   Ambulation/Gait Assistance Details PT utilized mirror for visual feedback, demo, and cueing for proper engagement of hydraulic knee    Ambulation Distance (Feet) 250 Feet   Assistive device Prosthesis   Gait Pattern Step-through pattern;Decreased arm swing - right;Decreased step length - left;Decreased stance time - right;Decreased stride length;Decreased  hip/knee flexion - right;Right circumduction;Right hip hike;Antalgic;Trunk rotated posteriorly on right;Abducted- right   Ambulation Surface Level;Indoor   Stairs --   Stairs Assistance --   Stair Management Technique --   Number of Stairs --   Ramp --   Gait Comments PT instructed patient in ambulating around cones placed 5 feet apart. PT required to provide supervision and cueing for proper weight shift, sequencing, and step length on inside step (step nearest to  obstacles) to achieve proper hydraulic knee engagement. PT progressed prosthetic gait activity beside a table (to give visual cue for a turn) to work on hydraulic knee engagement when turning 90 degrees (turning around edge of table).     Posture/Postural Control   Posture/Postural Control Postural limitations   Postural Limitations Forward head;Flexed trunk;Weight shift left     Therapeutic Activites    Therapeutic Activities Lifting   Lifting PT instructed patient in retrieving 5 pound weight from floor. Patient required light RUE support on armrest, demo from PT, and cueing for proper sequencing, technique and proper weight shift to engage hydraulic knee. Progressed activity to lifting empty crate placed between his feet.   Other Therapeutic Activities --     Neuro Re-ed    Neuro Re-ed Details  At countertop: PT instructed patient in stepping and pivoting exercise. Patient started facing the countertop, then placed foot laterally or posterior-lateral to work on pivoting. PT utilized visual cues (dots to step on), demo, and cueing for proper technique. PT progressed activity to a pivot step followed by multiple forward steps to work on proper weight shift and hydraulic knee engagement when stepping and pivoting in multiple directions.  PT progressed activity away from countertop to work on pivot turning without any available UE support. PT utilizing a color change in floor tiles as a visual cue for patient to pivot turn/open hips, then take multiple forward steps. Performed pivoting both directions. Patient requires demo and cueing for proper technique and weight shift to consistently achieve hydraulic knee engagement.     Prosthetics   Current prosthetic wear tolerance (days/week)  daily   Current prosthetic wear tolerance (#hours/day)  all awake hours   Residual limb condition  Patient denies any skin integrity issues                 PT Education - 04/09/17 1525    Education  provided Yes   Education Details appointment with [employer] next week to adjust resistance on hydraulic knee; added to patient's HEP    Person(s) Educated Patient   Methods Explanation;Demonstration;Tactile cues;Verbal cues   Comprehension Verbalized understanding;Returned demonstration          PT Short Term Goals - 04/02/17 1025      PT SHORT TERM GOAL #1   Title Patient able to ambulate with prosthesis only with head turns to scan environment maintaining path & pace.  (Target Date: 03/27/2017)   Baseline MET 03/25/2017    Time 4   Period Weeks   Status Achieved     PT SHORT TERM GOAL #2   Title Patient descends stairs reciprocally with 2 rails with verbal cues.   (Target Date: 03/27/2017)   Baseline MET 03/25/2017    Time 4   Period Weeks   Status Achieved     PT SHORT TERM GOAL #3   Title Patient able to pick up objects from floor engaging hydraulics on prosthetic unit.   (Target Date: 03/27/2017)   Baseline MET 03/25/2017    Time 4  Period Weeks   Status Achieved     PT SHORT TERM GOAL #4   Status On-going     PT SHORT TERM GOAL #5   Status On-going           PT Long Term Goals - 03/12/17 1440      PT LONG TERM GOAL #1   Title Patient verbalizes & demonstrates understanding of prosthetic care / use of microprocessor unit to enable safe use of prosthesis. (Target Date: 04/24/2017)   Time 8   Period Weeks   Status On-going     PT LONG TERM GOAL #2   Title Functional Gait Assessment > 19/ 30 to indicate lower fall risk with gait.  (Target Date: 04/24/2017)   Time 8   Period Weeks   Status On-going     PT LONG TERM GOAL #3   Title Oceanographer Test >/= 52/56 to indicate lower fall risk. (Target Date: 04/24/2017)   Time 8   Period Weeks   Status On-going     PT LONG TERM GOAL #4   Title Patient ambulates 1000' with micorprocessor prosthesis outside including grass, ramps & curbs independent for community access.  (Target Date: 04/24/2017)   Time 8   Period Weeks    Status On-going     PT LONG TERM GOAL #5   Title Patient able to increase gait velocity >1.0 ft/sec comfortable to fast pace.  (Target Date: 04/24/2017)   Time 8   Period Weeks   Status On-going               Plan - 04/09/17 1531    Clinical Impression Statement Today's skilled PT session focused on addressing the patient's prosthetic gait specifically in pivot turning and retrieving objects from the floor. Patient requires cueing for sequencing and weight shift to achieve proper hydraulic knee engagement. Patient is making progress, and will benefit from continued skilled PT to address functional mobility deficits.    Rehab Potential Good   PT Frequency 1x / week   PT Duration 8 weeks   PT Treatment/Interventions ADLs/Self Care Home Management;Gait training;Stair training;Functional mobility training;Therapeutic activities;Therapeutic exercise;Balance training;Neuromuscular re-education;Patient/family education;Prosthetic Training   PT Next Visit Plan progress prosthetic gait training and balance activities    Consulted and Agree with Plan of Care Patient      Patient will benefit from skilled therapeutic intervention in order to improve the following deficits and impairments:  Abnormal gait, Decreased balance, Decreased endurance, Postural dysfunction, Prosthetic Dependency, Decreased mobility  Visit Diagnosis: Other abnormalities of gait and mobility  Unsteadiness on feet  Other symptoms and signs involving the musculoskeletal system     Problem List Patient Active Problem List   Diagnosis Date Noted  . Type 2 diabetes with complication ([place]) 48/27/0786  . Phantom pain after amputation of lower extremity ([place]) 01/18/2016  . Neuropathic pain   . S/P AKA (above knee amputation), right ([place]) 12/29/2015  . PAD (peripheral artery disease) ([place]) 12/15/2015  . Hyperlipidemia associated with type 2 diabetes mellitus ([place]) 12/07/2015  . Hypertension associated with diabetes  [facility]) 12/04/2014    Arelia Sneddon, SPT  04/09/2017, 3:32 PM  [facility] [address] [place], [place], [address] Phone: [phone]   Fax:  [phone]  Name: Adam [name] MRN: [id number] Date of Birth: 1951/11/04

## 2017-04-15 ENCOUNTER — Encounter: Payer: Medicare Other | Admitting: Physical Therapy

## 2017-04-16 ENCOUNTER — Encounter: Payer: Medicare Other | Admitting: Physical Therapy

## 2017-04-16 ENCOUNTER — Encounter: Payer: Self-pay | Admitting: Physical Therapy

## 2017-04-16 ENCOUNTER — Ambulatory Visit: Payer: Medicare Other | Admitting: Physical Therapy

## 2017-04-16 DIAGNOSIS — R2681 Unsteadiness on feet: Secondary | ICD-10-CM | POA: Diagnosis not present

## 2017-04-16 DIAGNOSIS — R29898 Other symptoms and signs involving the musculoskeletal system: Secondary | ICD-10-CM

## 2017-04-16 DIAGNOSIS — R2689 Other abnormalities of gait and mobility: Secondary | ICD-10-CM | POA: Diagnosis not present

## 2017-04-16 DIAGNOSIS — Z89611 Acquired absence of right leg above knee: Secondary | ICD-10-CM | POA: Diagnosis not present

## 2017-04-16 NOTE — Patient Instructions (Signed)
Place two objects as end points and walk around two obstacles in "figure-8's" and "ovals" with prosthesis on both inside and outside during turns.

## 2017-04-16 NOTE — Therapy (Signed)
[facility] [address] [place], [place], [address] Phone: [phone]   Fax:  [phone]  Physical Therapy Treatment  Patient Details  Name: Adam [name] MRN: [id number] Date of Birth: Feb 13, 1951 Referring Provider: Jill Alexanders, MD  Encounter Date: 04/16/2017      PT End of Session - 04/16/17 1344    Visit Number 7   Number of Visits 9   Date for PT Re-Evaluation 04/24/17   Authorization Type Medicare G-code & progress note every 10th visit   PT Start Time 1145   PT Stop Time 1230   PT Time Calculation (min) 45 min   Activity Tolerance Patient tolerated treatment well   Behavior During Therapy [facility] for tasks assessed/performed      Past Medical History:  Diagnosis Date  . Gangrene ([place]) 11/22/2015   right great toe  . Hypertension   . OSA on CPAP   . PAD (peripheral artery disease) ([place])   . Peripheral vascular disease ([place])   . Type II diabetes mellitus ([place]) dx'd 11/2015    Past Surgical History:  Procedure Laterality Date  . AMPUTATION Right 12/01/2015   Procedure: AMPUTATION RIGHT GREAT;  Surgeon: Conrad [place], MD;  Location: [facility];  Service: Vascular;  Laterality: Right;  . AMPUTATION Right 12/16/2015   Procedure: AMPUTATION BELOW KNEE;  Surgeon: [name], MD;  Location: [facility];  Service: Vascular;  Laterality: Right;  . AMPUTATION Right 12/26/2015   Procedure: RIGHT ABOVE KNEE AMPUTATION;  Surgeon: [name], MD;  Location: [facility];  Service: Vascular;  Laterality: Right;  . COLONOSCOPY    . FEMORAL-POPLITEAL BYPASS GRAFT Right 11/28/2015   Procedure: RIGHT  FEMORAL-POPLITEAL ARTERY BYPASS GRAFT AND CONSTRUCTION OF MILLER CUFF USING 6 MM X 80 CM PROPATEN  GRAFT ;  Surgeon: Conrad [place]aren[place], MD;  Location: [facility];  Service: Vascular;  Laterality: Right;  . INGUINAL HERNIA REPAIR Bilateral 2000  . PERIPHERAL VASCULAR CATHETERIZATION N/A 11/26/2015   Procedure: Abdominal Aortogram;  Surgeon: [name], MD;  Location: [facility];  Service: Cardiovascular;  Laterality: N/A;  . VASECTOMY      There were no vitals filed for this visit.      Subjective Assessment - 04/16/17 1148    Subjective Patient reports he saw prosthetist this morning just before coming to PT appointment to decrease hydraulic knee resistance. Patient denies any falls. Patient reports he has not utilized a stationary bike. Patient reports trouble getting into boat with prosthesis.   Pertinent History right TFA 12/26/2015, DM2, HTN, PVD/PAD   Limitations Lifting;Standing;Walking;House hold activities   Patient Stated Goals Use new microprocessor prosthesis walking on uneven terrain including golf.    Currently in Pain? No/denies                         [facility] Adult PT Treatment/Exercise - 04/16/17 1145      Transfers   Transfers Sit to Stand;Stand to Sit   Sit to Stand 6: Modified independent (Device/Increase time);With upper extremity assist;With armrests;From chair/3-in-1   Stand to Sit 6: Modified independent (Device/Increase time);With upper extremity assist;With armrests;To chair/3-in-1     Ambulation/Gait   Ambulation/Gait Yes   Ambulation/Gait Assistance 5: Supervision   Ambulation/Gait Assistance Details requires cueing for hydraulic knee activation in terminal stance    Ambulation Distance (Feet) 250 Feet   Assistive device Prosthesis   Gait Pattern Step-through pattern;Decreased arm swing - right;Decreased step  length - left;Decreased stance time - right;Decreased stride length;Decreased hip/knee flexion - right;Right circumduction;Right hip hike;Antalgic;Trunk rotated posteriorly on right;Abducted- right   Stairs Yes   Stairs Assistance 5: Supervision;Other (comment);4: Min assist  min guard    Stairs Assistance Details (indicate cue type and reason) Requires cueing for sequencing and foot placement for proper hydraulic knee engagement. Introduced use of visual cues on stairs  for foot placement. Progressed from two rails to one rail on L side (when descending stairs) requiring verbal cueing for sequencing and min guard with intermittent min A due to loss of balance. Introduced use of cane on R side + L rail.    Stair Management Technique One rail Left;Two rails;Step to pattern;Forwards   Number of Stairs 4   Ramp 5: Supervision  with cane    Ramp Details (indicate cue type and reason) requires cueing for proper sequencing, weight shift, and foot placement for hydraulic knee engagement    Gait Comments PT instructed patient in ambulating around obstacles with cueing for step length and prosthetic knee flexion. Progressed to practicing tight pivot turning around an obstacle. Performed pivot turns both directions. Requires cueing for step length and weight shift. PT utilized visual cueing of floor tiles to foster proper foot placement in a tight pivot turn. In parallel bars: stepping forward from 4 inch block onto 12 degree incline & 6 degree incline to practice forward step onto surfaces at various inclines. Requires demo and cueing for proper sequencing and technique.     Posture/Postural Control   Posture/Postural Control Postural limitations   Postural Limitations Forward head;Flexed trunk;Weight shift left     Therapeutic Activites    Therapeutic Activities Lifting   Lifting PT instructed patient in retrieving object from the floor. Requires cueing for weight shift to foster proper hydraulic knee engagement.     Neuro Re-ed    Neuro Re-ed Details  --     Prosthetics   Prosthetic Care Comments  Patient reports he had appointment with prosthetist today to alter hydraulic knee resistance.   Current prosthetic wear tolerance (days/week)  daily   Current prosthetic wear tolerance (#hours/day)  all awake hours   Residual limb condition  Patient denies any skin integrity issues                 PT Education - 04/16/17 1154    Education provided Yes    Education Details PT demonstrated and instructed patient in getting into boat with proper sequencing and foot placement based on patient's description and pictures of the boat setup; HEP for prosthetic gait figure-8 and "ovals" around two obstacles   Person(s) Educated Patient   Methods Explanation;Demonstration;Tactile cues;Verbal cues   Comprehension Verbalized understanding;Returned demonstration;Verbal cues required;Tactile cues required          PT Short Term Goals - 04/02/17 0924      PT SHORT TERM GOAL #1   Title Patient able to ambulate with prosthesis only with head turns to scan environment maintaining path & pace.  (Target Date: 03/27/2017)   Baseline MET 03/25/2017    Time 4   Period Weeks   Status Achieved     PT SHORT TERM GOAL #2   Title Patient descends stairs reciprocally with 2 rails with verbal cues.   (Target Date: 03/27/2017)   Baseline MET 03/25/2017    Time 4   Period Weeks   Status Achieved     PT SHORT TERM GOAL #3   Title Patient able to pick up  objects from floor engaging hydraulics on prosthetic unit.   (Target Date: 03/27/2017)   Baseline MET 03/25/2017    Time 4   Period Weeks   Status Achieved     PT SHORT TERM GOAL #4   Status On-going     PT SHORT TERM GOAL #5   Status On-going           PT Long Term Goals - 03/12/17 1440      PT LONG TERM GOAL #1   Title Patient verbalizes & demonstrates understanding of prosthetic care / use of microprocessor unit to enable safe use of prosthesis. (Target Date: 04/24/2017)   Time 8   Period Weeks   Status On-going     PT LONG TERM GOAL #2   Title Functional Gait Assessment > 19/ 30 to indicate lower fall risk with gait.  (Target Date: 04/24/2017)   Time 8   Period Weeks   Status On-going     PT LONG TERM GOAL #3   Title Oceanographer Test >/= 52/56 to indicate lower fall risk. (Target Date: 04/24/2017)   Time 8   Period Weeks   Status On-going     PT LONG TERM GOAL #4   Title Patient ambulates 1000'  with micorprocessor prosthesis outside including grass, ramps & curbs independent for community access.  (Target Date: 04/24/2017)   Time 8   Period Weeks   Status On-going     PT LONG TERM GOAL #5   Title Patient able to increase gait velocity >1.0 ft/sec comfortable to fast pace.  (Target Date: 04/24/2017)   Time 8   Period Weeks   Status On-going               Plan - 04/16/17 1346    Clinical Impression Statement Today's skilled PT session focused on advancing patient's prosthetic gait with a focus on proper hydraulic knee engagement by working on tight turns, figure 8 walking, descending stairs, and stepping on an incline. Patient continues to require cueing for proper weight shift and sequencing to consistently achieve proper hydraulic knee engagement. Patient is making progress towards goals, and will benefit from continued skilled PT to address functional mobility deficits.    Rehab Potential Good   PT Frequency 1x / week   PT Duration 8 weeks   PT Treatment/Interventions ADLs/Self Care Home Management;Gait training;Stair training;Functional mobility training;Therapeutic activities;Therapeutic exercise;Balance training;Neuromuscular re-education;Patient/family education;Prosthetic Training   PT Next Visit Plan check LTGs; Anticipate recertification. advance prosthetic gait and balance activities with emphasis on consistent, proper hydraulic knee engagement    Consulted and Agree with Plan of Care Patient      Patient will benefit from skilled therapeutic intervention in order to improve the following deficits and impairments:  Abnormal gait, Decreased balance, Decreased endurance, Postural dysfunction, Prosthetic Dependency, Decreased mobility  Visit Diagnosis: Other abnormalities of gait and mobility  Unsteadiness on feet  Other symptoms and signs involving the musculoskeletal system     Problem List Patient Active Problem List   Diagnosis Date Noted  . Type 2  diabetes with complication ([place]) 56/38/7564  . Phantom pain after amputation of lower extremity ([place]) 01/18/2016  . Neuropathic pain   . S/P AKA (above knee amputation), right ([place]) 12/29/2015  . PAD (peripheral artery disease) ([place]) 12/15/2015  . Hyperlipidemia associated with type 2 diabetes mellitus ([place]) 12/07/2015  . Hypertension associated with diabetes ([place]) 12/04/2014    Arelia Sneddon, SPT  04/16/2017, 1:47 PM  [facility]  [address] [place], [place], [address] Phone: [phone]   Fax:  [phone]  Name: Adam [name] MRN: [id number] Date of Birth: 1951/02/22

## 2017-04-22 ENCOUNTER — Ambulatory Visit: Payer: Medicare Other | Attending: Family Medicine | Admitting: Physical Therapy

## 2017-04-22 ENCOUNTER — Encounter: Payer: Self-pay | Admitting: Physical Therapy

## 2017-04-22 DIAGNOSIS — R29898 Other symptoms and signs involving the musculoskeletal system: Secondary | ICD-10-CM

## 2017-04-22 DIAGNOSIS — R2689 Other abnormalities of gait and mobility: Secondary | ICD-10-CM | POA: Insufficient documentation

## 2017-04-22 DIAGNOSIS — R2681 Unsteadiness on feet: Secondary | ICD-10-CM | POA: Insufficient documentation

## 2017-04-22 NOTE — Therapy (Signed)
[facility] [address] [place], [place], [address] Phone: [phone]   Fax:  [phone]  Physical Therapy Treatment  Patient Details  Name: Adam [name] MRN: [id number] Date of Birth: Nov 22, 1950 Referring Provider: Jill Alexanders, MD  Encounter Date: 04/22/2017      PT End of Session - 04/22/17 1017    Visit Number 7   Number of Visits 17   Date for PT Re-Evaluation 06/19/17   Authorization Type Medicare G-code & progress note every 10th visit   PT Start Time 0930   PT Stop Time 1015   PT Time Calculation (min) 45 min   Activity Tolerance Patient tolerated treatment well   Behavior During Therapy [facility] for tasks assessed/performed      Past Medical History:  Diagnosis Date  . Gangrene ([place]) 11/22/2015   right great toe  . Hypertension   . OSA on CPAP   . PAD (peripheral artery disease) ([place])   . Peripheral vascular disease ([place])   . Type II diabetes mellitus ([place]) dx'd 11/2015    Past Surgical History:  Procedure Laterality Date  . AMPUTATION Right 12/01/2015   Procedure: AMPUTATION RIGHT GREAT;  Surgeon: Conrad [place], MD;  Location: [place];  Service: Vascular;  Laterality: Right;  . AMPUTATION Right 12/16/2015   Procedure: AMPUTATION BELOW KNEE;  Surgeon: [name], MD;  Location: [place];  Service: Vascular;  Laterality: Right;  . AMPUTATION Right 12/26/2015   Procedure: RIGHT ABOVE KNEE AMPUTATION;  Surgeon: [name], MD;  Location: [place];  Service: Vascular;  Laterality: Right;  . COLONOSCOPY    . FEMORAL-POPLITEAL BYPASS GRAFT Right 11/28/2015   Procedure: RIGHT  FEMORAL-POPLITEAL ARTERY BYPASS GRAFT AND CONSTRUCTION OF MILLER CUFF USING 6 MM X 80 CM PROPATEN  GRAFT ;  Surgeon: Conrad Roberts, MD;  Location: [place];  Service: Vascular;  Laterality: Right;  . INGUINAL HERNIA REPAIR Bilateral 2000  . PERIPHERAL VASCULAR CATHETERIZATION N/A 11/26/2015   Procedure: Abdominal Aortogram;  Surgeon: [name], MD;  Location: [place] [facility];  Service: Cardiovascular;  Laterality: N/A;  . VASECTOMY      There were no vitals filed for this visit.      Subjective Assessment - 04/22/17 0935    Subjective Patient denies any changes since last visit or falls.    Pertinent History right TFA 12/26/2015, DM2, HTN, PVD/PAD   Limitations Lifting;Standing;Walking;House hold activities   Patient Stated Goals Use new microprocessor prosthesis walking on uneven terrain including golf.    Currently in Pain? No/denies            [facility] PT Assessment - 04/22/17 0930      Berg Balance Test   Sit to Stand Able to stand without using hands and stabilize independently   Standing Unsupported Able to stand safely 2 minutes   Sitting with Back Unsupported but Feet Supported on Floor or Stool Able to sit safely and securely 2 minutes   Stand to Sit Sits safely with minimal use of hands   Transfers Able to transfer safely, minor use of hands   Standing Unsupported with Eyes Closed Able to stand 10 seconds safely   Standing Ubsupported with Feet Together Able to place feet together independently and stand 1 minute safely   From Standing, Reach Forward with Outstretched Arm Can reach confidently >25 cm (10")   From Standing Position, Pick up Object from Floor Able to pick up shoe, needs supervision  From Standing Position, Turn to Look Behind Over each Shoulder Looks behind from both sides and weight shifts well   Turn 360 Degrees Able to turn 360 degrees safely one side only in 4 seconds or less   Standing Unsupported, Alternately Place Feet on Step/Stool Able to stand independently and complete 8 steps >20 seconds   Standing Unsupported, One Foot in Front Able to plae foot ahead of the other independently and hold 30 seconds   Standing on One Leg Able to lift leg independently and hold > 10 seconds   Total Score 52     Functional Gait  Assessment   Gait assessed  Yes   Gait Level Surface Walks 20  ft in less than 7 sec but greater than 5.5 sec, uses assistive device, slower speed, mild gait deviations, or deviates 6-10 in outside of the 12 in walkway width.   Change in Gait Speed Able to change speed, demonstrates mild gait deviations, deviates 6-10 in outside of the 12 in walkway width, or no gait deviations, unable to achieve a major change in velocity, or uses a change in velocity, or uses an assistive device.   Gait with Horizontal Head Turns Performs head turns smoothly with no change in gait. Deviates no more than 6 in outside 12 in walkway width   Gait with Vertical Head Turns Performs task with slight change in gait velocity (eg, minor disruption to smooth gait path), deviates 6 - 10 in outside 12 in walkway width or uses assistive device   Gait and Pivot Turn Pivot turns safely in greater than 3 sec and stops with no loss of balance, or pivot turns safely within 3 sec and stops with mild imbalance, requires small steps to catch balance.   Step Over Obstacle Is able to step over one shoe box (4.5 in total height) but must slow down and adjust steps to clear box safely. May require verbal cueing.   Gait with Narrow Base of Support Ambulates less than 4 steps heel to toe or cannot perform without assistance.   Gait with Eyes Closed Walks 20 ft, uses assistive device, slower speed, mild gait deviations, deviates 6-10 in outside 12 in walkway width. Ambulates 20 ft in less than 9 sec but greater than 7 sec.   Ambulating Backwards Walks 20 ft, uses assistive device, slower speed, mild gait deviations, deviates 6-10 in outside 12 in walkway width.   Steps Two feet to a stair, must use rail.   Total Score 17                     [facility] Adult PT Treatment/Exercise - 04/22/17 0930      Transfers   Transfers Sit to Stand;Stand to Sit   Sit to Stand 6: Modified independent (Device/Increase time);With upper extremity assist;With armrests;From chair/3-in-1   Stand to Sit 6: Modified  independent (Device/Increase time);With upper extremity assist;With armrests;To chair/3-in-1     Ambulation/Gait   Ambulation/Gait Yes   Ambulation/Gait Assistance 5: Supervision   Ambulation/Gait Assistance Details requires cueing for proper weight shift and step length to foster proper hydraulic knee engagement   Ambulation Distance (Feet) 1000 Feet   Assistive device Prosthesis   Gait Pattern Step-through pattern;Decreased arm swing - right;Decreased step length - left;Decreased stance time - right;Decreased stride length;Decreased hip/knee flexion - right;Right circumduction;Right hip hike;Antalgic;Trunk rotated posteriorly on right;Abducted- right   Ambulation Surface Level;Unlevel;Indoor;Outdoor;Paved;Gravel;Grass   Gait velocity 2.8f/s  3.374fs fast pace; 2.9760f comfortable pace  Stairs Yes   Stairs Assistance 5: Supervision   Stair Management Technique Two rails;Step to pattern;Forwards   Number of Stairs 4   Door Management 7: Independent   Ramp 7: Independent   Gait Comments PT instructed patient in ambulating with proper technique for 90 degree turns. Patient requires cueing for step length to foster proper hydraulic knee engagement.     Posture/Postural Control   Posture/Postural Control Postural limitations   Postural Limitations Forward head;Flexed trunk;Weight shift left     Therapeutic Activites    Therapeutic Activities --   Lifting --     Prosthetics   Prosthetic Care Comments  --   Current prosthetic wear tolerance (days/week)  daily   Current prosthetic wear tolerance (#hours/day)  all awake hours   Residual limb condition  Patient denies any skin integrity issues                 PT Education - 04/22/17 1015    Education provided Yes   Education Details Berg balance test and functional gait assessment results    Person(s) Educated Patient   Methods Explanation   Comprehension Verbalized understanding          PT Short Term Goals -  04/22/17 1534      PT SHORT TERM GOAL #1   Title Patient will demonstrate an increase in gait velocity between comfortable pace adn fast pace gait velocity by >/= 0.92f/s to indicate improvement in functional mobility. (TARGET DATE: 05/22/2017)    Time 4   Period Weeks   Status New     PT SHORT TERM GOAL #2   Title Patient will demonstrate ability to retrieve an object from the floor by properly activating prosthetic knee with modified independence to indicate a decrease in his risk of falling. (TARGET DATE: 05/22/2017)    Time 4   Period Weeks   Status New           PT Long Term Goals - 04/22/17 01155     PT LONG TERM GOAL #1   Title Patient verbalizes & demonstrates understanding of prosthetic care / use of microprocessor unit to enable safe use of prosthesis. (Target Date: 04/24/2017)   Baseline MET 04/22/2017   Time 8   Period Weeks   Status Achieved     PT LONG TERM GOAL #2   Title Functional Gait Assessment > 19/ 30 to indicate lower fall risk with gait.  (NEW TARGET DATE: 06/19/2017)    Baseline On-going 04/22/2017  FGA = 17/30   Time 8   Period Weeks   Status On-going     PT LONG TERM GOAL #3   Title Berg Balance Test >/= 52/56 to indicate lower fall risk. (Target Date: 04/24/2017)   Baseline MET 04/22/2017 BMerrilee JanskyBalance Test = 52/56   Time 8   Period Weeks   Status Achieved     PT LONG TERM GOAL #4   Title Patient ambulates 1000' with micorprocessor prosthesis outside including grass, ramps & curbs independent for community access.  (Target Date: 04/24/2017)   Baseline MET 04/22/2017   Time 8   Period Weeks   Status Achieved     PT LONG TERM GOAL #5   Title Patient able to increase gait velocity >0.879fsec comfortable to fast pace.  (NEW TARGET DATE: 06/19/2017)    Baseline 04/22/2017 increase in gait velocity = 0.362f    Time 8   Period Weeks   Status Revised     PT LONG TERM  GOAL #6   Title Patient will demonstrate ability to ambulate on stairs with reciprocal gait with 1  rail and modified independence to indicate a decrease in risk of falling. (TARGET DATE: 06/19/2017)   Time 8   Period Weeks   Status New               Plan - 04/22/17 1526    Clinical Impression Statement Today's skilled PT session focused on assessing patient's long term goals and advancing patient's prosthetic gait with emphasis on engaging hydraulic knee. Patient has met 4 of 5 long term goals. PT plans to recertify patient for an additional 8 weeks of care to continue to address functional mobility deficits. Patient is making progress towards goals, and will benefit from continued skilled PT.    Rehab Potential Good   PT Frequency 1x / week   PT Duration 8 weeks   PT Treatment/Interventions ADLs/Self Care Home Management;Gait training;Stair training;Functional mobility training;Therapeutic activities;Therapeutic exercise;Balance training;Neuromuscular re-education;Patient/family education;Prosthetic Training   PT Next Visit Plan advance prosthetic gait and balance activities with emphasis on hydraulic knee engagement; G-code due next week    Consulted and Agree with Plan of Care Patient      Patient will benefit from skilled therapeutic intervention in order to improve the following deficits and impairments:  Abnormal gait, Decreased balance, Decreased endurance, Postural dysfunction, Prosthetic Dependency, Decreased mobility  Visit Diagnosis: Other abnormalities of gait and mobility  Unsteadiness on feet  Other symptoms and signs involving the musculoskeletal system     Problem List Patient Active Problem List   Diagnosis Date Noted  . Type 2 diabetes with complication ([place]) 38/32/9191  . Phantom pain after amputation of lower extremity ([employer]) 01/18/2016  . Neuropathic pain   . S/P AKA (above knee amputation), right ([place]) 12/29/2015  . PAD (peripheral artery disease) ([place]) 12/15/2015  . Hyperlipidemia associated with type 2 diabetes mellitus ([place]) 12/07/2015  .  Hypertension associated with diabetes [facility]) 12/04/2014    Arelia Sneddon, SPT  04/22/2017, 3:39 PM  [facility] [address] [place], [place], [address] Phone: [phone]   Fax:  [phone]  Name: Adam [name] MRN: [id number] Date of Birth: September 10, 1951

## 2017-04-27 ENCOUNTER — Other Ambulatory Visit: Payer: Self-pay | Admitting: Family Medicine

## 2017-04-28 ENCOUNTER — Other Ambulatory Visit: Payer: Self-pay | Admitting: Family Medicine

## 2017-04-28 DIAGNOSIS — L910 Hypertrophic scar: Secondary | ICD-10-CM | POA: Diagnosis not present

## 2017-04-28 DIAGNOSIS — L821 Other seborrheic keratosis: Secondary | ICD-10-CM | POA: Diagnosis not present

## 2017-04-30 ENCOUNTER — Ambulatory Visit: Payer: Medicare Other | Admitting: Physical Therapy

## 2017-04-30 ENCOUNTER — Encounter: Payer: Self-pay | Admitting: Physical Therapy

## 2017-04-30 DIAGNOSIS — R2681 Unsteadiness on feet: Secondary | ICD-10-CM | POA: Diagnosis not present

## 2017-04-30 DIAGNOSIS — R29898 Other symptoms and signs involving the musculoskeletal system: Secondary | ICD-10-CM | POA: Diagnosis not present

## 2017-04-30 DIAGNOSIS — R2689 Other abnormalities of gait and mobility: Secondary | ICD-10-CM

## 2017-05-01 NOTE — Therapy (Addendum)
[facility] [address] [place], [place], [address] Phone: [phone]   Fax:  [phone]  Physical Therapy Treatment  Patient Details  Name: Adam [name] MRN: [id number] Date of Birth: 1951/04/16 Referring Provider: Jill Alexanders, MD  Encounter Date: 04/30/2017      PT End of Session - 04/30/17 1453    Visit Number 7   Number of Visits 17   Date for PT Re-Evaluation 06/19/17   Authorization Type Medicare G-code & progress note every 10th visit   PT Start Time 1449   PT Stop Time 1530   PT Time Calculation (min) 41 min   Equipment Utilized During Treatment Gait belt   Activity Tolerance Patient tolerated treatment well   Behavior During Therapy [facility] for tasks assessed/performed      Past Medical History:  Diagnosis Date  . Gangrene ([place]) 11/22/2015   right great toe  . Hypertension   . OSA on CPAP   . PAD (peripheral artery disease) ([place])   . Peripheral vascular disease ([place])   . Type II diabetes mellitus ([place]) dx'd 11/2015    Past Surgical History:  Procedure Laterality Date  . AMPUTATION Right 12/01/2015   Procedure: AMPUTATION RIGHT GREAT;  Surgeon: Conrad [place], MD;  Location: [place];  Service: Vascular;  Laterality: Right;  . AMPUTATION Right 12/16/2015   Procedure: AMPUTATION BELOW KNEE;  Surgeon: [name], MD;  Location: [place];  Service: Vascular;  Laterality: Right;  . AMPUTATION Right 12/26/2015   Procedure: RIGHT ABOVE KNEE AMPUTATION;  Surgeon: [name], MD;  Location: [place];  Service: Vascular;  Laterality: Right;  . COLONOSCOPY    . FEMORAL-POPLITEAL BYPASS GRAFT Right 11/28/2015   Procedure: RIGHT  FEMORAL-POPLITEAL ARTERY BYPASS GRAFT AND CONSTRUCTION OF MILLER CUFF USING 6 MM X 80 CM PROPATEN  GRAFT ;  Surgeon: Conrad [place], MD;  Location: [facility] [place];  Service: Vascular;  Laterality: Right;  . INGUINAL HERNIA REPAIR Bilateral 2000  . PERIPHERAL VASCULAR CATHETERIZATION N/A 11/26/2015   Procedure: Abdominal Aortogram;  Surgeon: [name], MD;  Location: [place] [facility];  Service: Cardiovascular;  Laterality: N/A;  . VASECTOMY      There were no vitals filed for this visit.      Subjective Assessment - 04/30/17 1452    Subjective No new complaints. No pain or falls to report. Saw Bobby who tweaked the settings on his prothesis, now looser in knee.    Pertinent History right TFA 12/26/2015, DM2, HTN, PVD/PAD   Limitations Lifting;Standing;Walking;House hold activities   Patient Stated Goals Use new microprocessor prosthesis walking on uneven terrain including golf.    Currently in Pain? No/denies        04/30/17 1454  Transfers  Transfers Sit to Stand;Stand to Sit  Sit to Stand 6: Modified independent (Device/Increase time);With upper extremity assist;With armrests;From chair/3-in-1  Stand to Sit 6: Modified independent (Device/Increase time);With upper extremity assist;With armrests;To chair/3-in-1  Ambulation/Gait  Ambulation/Gait Yes  Ambulation/Gait Assistance 5: Supervision  Ambulation/Gait Assistance Details cues to narrow base of support due to keeping prothesis abducted out to side. cues to engage prosthesis knee with gait as well.   Ambulation Distance (Feet) 250 Feet (x1,  plus around gym)  Assistive device Prosthesis  Gait Pattern Step-through pattern;Decreased arm swing - right;Decreased step length - left;Decreased stance time - right;Decreased stride length;Decreased hip/knee flexion - right;Right circumduction;Right hip hike;Antalgic;Trunk rotated posteriorly on right;Abducted- right  Ambulation Surface Level;Indoor  Stairs Yes  Stairs Assistance Details (indicate cue type and reason) blocked practice working on use of hydraulic knee with descending stairs. cues needed for weight shifting and technique to engage knee. pt demo'd good technique for ~3 reps with notable resistance from knee, otherwise he had a quick flexion moment with poor  control noted.   Stair Management Technique Two rails;Step to pattern;Forwards  Number of Stairs 4  Ramp 5: Supervision;Other (comment);4: Min assist  Ramp Details (indicate cue type and reason) blocked practice on ramp: with cues to decrease step/stride length and to engage knee with descending.  blocked practice on red mat over ramp to simulate compliant surfaces: cues needed for technique and to engage knee, use of cane for balance due to complaint surfaces.                       Neuro Re-ed   Neuro Re-ed Details  in ~50 foot hallway: fwd gait with head turns left<>right adn up<>down x 4 laps each with supervision, minor veering and decr gait speed noted; blocks set up next to counter- 4 inch box with inclines on both sides for downward stepping: blocked practice of use of prosthetic knee for going down declines with single UE support on counter for balance. cues on prosthetic placement and weight shifting with activity.                               PT Short Term Goals - 04/22/17 1534      PT SHORT TERM GOAL #1   Title Patient will demonstrate an increase in gait velocity between comfortable pace adn fast pace gait velocity by >/= 0.45f/s to indicate improvement in functional mobility. (TARGET DATE: 05/22/2017)    Time 4   Period Weeks   Status New     PT SHORT TERM GOAL #2   Title Patient will demonstrate ability to retrieve an object from the floor by properly activating prosthetic knee with modified independence to indicate a decrease in his risk of falling. (TARGET DATE: 05/22/2017)    Time 4   Period Weeks   Status New           PT Long Term Goals - 04/22/17 00938     PT LONG TERM GOAL #1   Title Patient verbalizes & demonstrates understanding of prosthetic care / use of microprocessor unit to enable safe use of prosthesis. (Target Date: 04/24/2017)   Baseline MET 04/22/2017   Time 8   Period Weeks   Status Achieved     PT LONG TERM GOAL #2   Title Functional Gait  Assessment > 19/ 30 to indicate lower fall risk with gait.  (NEW TARGET DATE: 06/19/2017)    Baseline On-going 04/22/2017  FGA = 17/30   Time 8   Period Weeks   Status On-going     PT LONG TERM GOAL #3   Title Berg Balance Test >/= 52/56 to indicate lower fall risk. (Target Date: 04/24/2017)   Baseline MET 04/22/2017 BMerrilee JanskyBalance Test = 52/56   Time 8   Period Weeks   Status Achieved     PT LONG TERM GOAL #4   Title Patient ambulates 1000' with micorprocessor prosthesis outside including grass, ramps & curbs independent for community access.  (Target Date: 04/24/2017)   Baseline MET 04/22/2017   Time 8   Period Weeks   Status Achieved     PT LONG TERM GOAL #  5   Title Patient able to increase gait velocity >0.31f/sec comfortable to fast pace.  (NEW TARGET DATE: 06/19/2017)    Baseline 04/22/2017 increase in gait velocity = 0.382fs    Time 8   Period Weeks   Status Revised     PT LONG TERM GOAL #6   Title Patient will demonstrate ability to ambulate on stairs with reciprocal gait with 1 rail and modified independence to indicate a decrease in risk of falling. (TARGET DATE: 06/19/2017)   Time 8   Period Weeks   Status New            Plan - [id number]    Clinical Impression Statement Today's skilled session focused on getting pt to engage prosthetic hydraulic knee with gait and barriers. Pt tends to not shift weight fully on prosthesis to engage knee, allowing a fast flexion moment at times. Pt needs continued instruction to fully engage knee with mobility. Pt should benefit from continued PT to progress toward unmet goals.    Rehab Potential Good   PT Frequency 1x / week   PT Duration 8 weeks   PT Treatment/Interventions ADLs/Self Care Home Management;Gait training;Stair training;Functional mobility training;Therapeutic activities;Therapeutic exercise;Balance training;Neuromuscular re-education;Patient/family education;Prosthetic Training   PT Next Visit Plan advance prosthetic gait and  balance activities with emphasis on hydraulic knee engagement;   Consulted and Agree with Plan of Care Patient      Patient will benefit from skilled therapeutic intervention in order to improve the following deficits and impairments:  Abnormal gait, Decreased balance, Decreased endurance, Postural dysfunction, Prosthetic Dependency, Decreased mobility  Visit Diagnosis: Other abnormalities of gait and mobility  Unsteadiness on feet       G-Codes - [id number]    Functional Assessment Tool Used (Outpatient Only) Functional Gait Assessment 17/30   Functional Limitation Mobility: Walking and moving around      Problem List Patient Active Problem List   Diagnosis Date Noted  . Type 2 diabetes with complication (HCSedalia0367/61/9509. Phantom pain after amputation of lower extremity (HCSun Valley03/01/2016  . Neuropathic pain   . S/P AKA (above knee amputation), right (HCClear Lake02/09/2016  . PAD (peripheral artery disease) (HCBlende01/28/2017  . Hyperlipidemia associated with type 2 diabetes mellitus (HCRosebud01/20/2017  . Hypertension associated with diabetes (HCRudolph01/18/2016    KaWillow OraPTA, CLBoswell17[address] Van Dyke RoadSuCedar PointNC [id number] 703 [address]/16/18, 4:33 PM   Name: StLINDON KIELRN: [id number] of Birth: [birth year]  [id number]  PT G-Codes  Functional Assessment Tool Used (Outpatient Only) Functional Gait Assessment 17/30  Functional Limitation Mobility: Walking and moving around  Mobility: Walking and Moving Around Current Status ([id number]  Mobility: Walking and Moving Around Goal Status ([id number]   Physical Therapy Progress Note  Dates of Reporting Period: 02/25/2017 to 6/18-Jun-2018Objective Reports of Subjective Statement: Patient reports improved mobility with microprocessor prosthesis.   Objective Measurements: see above  Goal Update: see above  Plan: continue established plan of care  Reason Skilled Services  are Required: Patient needs skilled care to progress mobility & gait with microprocessor prosthesis.  RoJamey ReasPT, DPT PT Specializing in PrAvon6/18/18 11:09 PM Phone:  [phone]:  [phone].uFlatoniarRockbridge[place] [address]

## 2017-05-06 ENCOUNTER — Ambulatory Visit: Payer: Medicare Other | Admitting: Physical Therapy

## 2017-05-06 ENCOUNTER — Encounter: Payer: Self-pay | Admitting: Physical Therapy

## 2017-05-06 DIAGNOSIS — R2681 Unsteadiness on feet: Secondary | ICD-10-CM | POA: Diagnosis not present

## 2017-05-06 DIAGNOSIS — R29898 Other symptoms and signs involving the musculoskeletal system: Secondary | ICD-10-CM | POA: Diagnosis not present

## 2017-05-06 DIAGNOSIS — R2689 Other abnormalities of gait and mobility: Secondary | ICD-10-CM | POA: Diagnosis not present

## 2017-05-06 NOTE — Therapy (Signed)
[facility] [address] [place], [place], [address] Phone: [phone]   Fax:  [phone]  Physical Therapy Treatment  Patient Details  Name: Adam [name] MRN: [id number] Date of Birth: 08/10/1951 Referring Provider: Jill Alexanders, MD  Encounter Date: 05/06/2017      PT End of Session - 05/06/17 0939    Visit Number 10  G2    Number of Visits 17   Date for PT Re-Evaluation 06/19/17   Authorization Type Medicare G-code & progress note every 10th visit   PT Start Time 0933   PT Stop Time 1015   PT Time Calculation (min) 42 min   Equipment Utilized During Treatment Gait belt   Activity Tolerance Patient tolerated treatment well   Behavior During Therapy [facility] for tasks assessed/performed      Past Medical History:  Diagnosis Date  . Gangrene ([place]) 11/22/2015   right great toe  . Hypertension   . OSA on CPAP   . PAD (peripheral artery disease) ([place])   . Peripheral vascular disease ([place])   . Type II diabetes mellitus ([place]) dx'd 11/2015    Past Surgical History:  Procedure Laterality Date  . AMPUTATION Right 12/01/2015   Procedure: AMPUTATION RIGHT GREAT;  Surgeon: Conrad [place], MD;  Location: [place];  Service: Vascular;  Laterality: Right;  . AMPUTATION Right 12/16/2015   Procedure: AMPUTATION BELOW KNEE;  Surgeon: [name], MD;  Location: [place];  Service: Vascular;  Laterality: Right;  . AMPUTATION Right 12/26/2015   Procedure: RIGHT ABOVE KNEE AMPUTATION;  Surgeon: [name], MD;  Location: [place];  Service: Vascular;  Laterality: Right;  . COLONOSCOPY    . FEMORAL-POPLITEAL BYPASS GRAFT Right 11/28/2015   Procedure: RIGHT  FEMORAL-POPLITEAL ARTERY BYPASS GRAFT AND CONSTRUCTION OF MILLER CUFF USING 6 MM X 80 CM PROPATEN  GRAFT ;  Surgeon: Conrad [place], MD;  Location: [place];  Service: Vascular;  Laterality: Right;  . INGUINAL HERNIA REPAIR Bilateral 2000  . PERIPHERAL VASCULAR CATHETERIZATION N/A 11/26/2015   Procedure: Abdominal Aortogram;  Surgeon: [name], MD;  Location: [place] [facility];  Service: Cardiovascular;  Laterality: N/A;  . VASECTOMY      There were no vitals filed for this visit.      Subjective Assessment - 05/06/17 0937    Subjective No new complaints. No falls or pain to report. Has not tried stationary bike due to not time to get to gym to try it.    Pertinent History right TFA 12/26/2015, DM2, HTN, PVD/PAD   Limitations Lifting;Standing;Walking;House hold activities   Patient Stated Goals Use new microprocessor prosthesis walking on uneven terrain including golf.    Currently in Pain? No/denies              [facility] Adult PT Treatment/Exercise - 05/06/17 0940      Transfers   Transfers Sit to Stand;Stand to Sit   Sit to Stand 6: Modified independent (Device/Increase time);With upper extremity assist;With armrests;From chair/3-in-1   Sit to Stand Details Verbal cues for technique   Stand to Sit 6: Modified independent (Device/Increase time);With upper extremity assist;With armrests;To chair/3-in-1   Stand to Sit Details (indicate cue type and reason) Verbal cues for technique   Comments blocked practice for equal weight bearing to engage knee and to maintain midline posture     Ambulation/Gait   Ambulation/Gait Yes   Ambulation/Gait Assistance 5: Supervision   Ambulation/Gait Assistance Details cues needed to narrow base  of support (keeps prosthesis abducted out), for increased right weight shiftng in stance and to decrease hip hike with prosthesis advancement. used mirrrors in parallel bars, progressing to back of the gym for visual feedback as well. minimal carryover noted this session.    Ambulation Distance (Feet) 250 Feet  x2, plus multiple laps in parallel bars/back of gym    Assistive device Prosthesis   Gait Pattern Step-through pattern;Decreased arm swing - right;Decreased step length - left;Decreased stance time - right;Decreased stride  length;Decreased hip/knee flexion - right;Right circumduction;Right hip hike;Antalgic;Trunk rotated posteriorly on right;Abducted- right   Ambulation Surface Level;Indoor   Stairs Yes   Stairs Assistance 5: Supervision   Stairs Assistance Details (indicate cue type and reason) blocked practice working on engaging prosthetic knee with descending stairs. cues for increaese weight shifting    Stair Management Technique Two rails;Step to pattern;Forwards   Number of Stairs 4   Ramp 4: Min assist   Ramp Details (indicate cue type and reason) cues/faciliation for step length, weight shiftng and heel>toe progressing of prosthesis with ramp management            PT Short Term Goals - 04/22/17 1534      PT SHORT TERM GOAL #1   Title Patient will demonstrate an increase in gait velocity between comfortable pace adn fast pace gait velocity by >/= 0.57ft/s to indicate improvement in functional mobility. (TARGET DATE: 05/22/2017)    Time 4   Period Weeks   Status New     PT SHORT TERM GOAL #2   Title Patient will demonstrate ability to retrieve an object from the floor by properly activating prosthetic knee with modified independence to indicate a decrease in his risk of falling. (TARGET DATE: 05/22/2017)    Time 4   Period Weeks   Status New           PT Long Term Goals - 04/22/17 6498      PT LONG TERM GOAL #1   Title Patient verbalizes & demonstrates understanding of prosthetic care / use of microprocessor unit to enable safe use of prosthesis. (Target Date: 04/24/2017)   Baseline MET 04/22/2017   Time 8   Period Weeks   Status Achieved     PT LONG TERM GOAL #2   Title Functional Gait Assessment > 19/ 30 to indicate lower fall risk with gait.  (NEW TARGET DATE: 06/19/2017)    Baseline On-going 04/22/2017  FGA = 17/30   Time 8   Period Weeks   Status On-going     PT LONG TERM GOAL #3   Title Berg Balance Test >/= 52/56 to indicate lower fall risk. (Target Date: 04/24/2017)   Baseline MET  04/22/2017 Sharlene Motts Balance Test = 52/56   Time 8   Period Weeks   Status Achieved     PT LONG TERM GOAL #4   Title Patient ambulates 1000' with micorprocessor prosthesis outside including grass, ramps & curbs independent for community access.  (Target Date: 04/24/2017)   Baseline MET 04/22/2017   Time 8   Period Weeks   Status Achieved     PT LONG TERM GOAL #5   Title Patient able to increase gait velocity >0.33ft/sec comfortable to fast pace.  (NEW TARGET DATE: 06/19/2017)    Baseline 04/22/2017 increase in gait velocity = 0.29ft/s    Time 8   Period Weeks   Status Revised     PT LONG TERM GOAL #6   Title Patient will demonstrate ability to ambulate  on stairs with reciprocal gait with 1 rail and modified independence to indicate a decrease in risk of falling. (TARGET DATE: 06/19/2017)   Time 8   Period Weeks   Status New            Plan - 05/06/17 0939    Clinical Impression Statement Today's skilled session continued to focus on use of hydraulic knee with gait/barriers with emphasis on decreased hip hike with prosthesis advancement. Pt will need continued reinforcement of this aspect of gait and continued emphasis on weight shifting to fully engage knee. Pt is progress and should benefit from continued PT to progress toward unmet goals.                 Rehab Potential Good   PT Frequency 1x / week   PT Duration 8 weeks   PT Treatment/Interventions ADLs/Self Care Home Management;Gait training;Stair training;Functional mobility training;Therapeutic activities;Therapeutic exercise;Balance training;Neuromuscular re-education;Patient/family education;Prosthetic Training   PT Next Visit Plan advance prosthetic gait and balance activities with emphasis on hydraulic knee engagement;   Consulted and Agree with Plan of Care Patient      Patient will benefit from skilled therapeutic intervention in order to improve the following deficits and impairments:  Abnormal gait, Decreased balance, Decreased  endurance, Postural dysfunction, Prosthetic Dependency, Decreased mobility  Visit Diagnosis: Other abnormalities of gait and mobility  Unsteadiness on feet  Other symptoms and signs involving the musculoskeletal system     Problem List Patient Active Problem List   Diagnosis Date Noted  . Type 2 diabetes with complication ([place]) 16/55/3748  . Phantom pain after amputation of lower extremity ([place]) 01/18/2016  . Neuropathic pain   . S/P AKA (above knee amputation), right ([place]) 12/29/2015  . PAD (peripheral artery disease) ([place]) 12/15/2015  . Hyperlipidemia associated with type 2 diabetes mellitus ([place]) 12/07/2015  . Hypertension associated with diabetes ([place]) 12/04/2014    Willow Ora, PTA, [facility] [address], [place],  [address] [phone] 05/06/17, 4:48 PM   Name: Adam [name] MRN: [id number] Date of Birth: 1951/03/30

## 2017-05-13 ENCOUNTER — Encounter: Payer: Self-pay | Admitting: Physical Therapy

## 2017-05-13 ENCOUNTER — Ambulatory Visit: Payer: Medicare Other | Admitting: Physical Therapy

## 2017-05-13 DIAGNOSIS — R29898 Other symptoms and signs involving the musculoskeletal system: Secondary | ICD-10-CM | POA: Diagnosis not present

## 2017-05-13 DIAGNOSIS — R2689 Other abnormalities of gait and mobility: Secondary | ICD-10-CM | POA: Diagnosis not present

## 2017-05-13 DIAGNOSIS — R2681 Unsteadiness on feet: Secondary | ICD-10-CM | POA: Diagnosis not present

## 2017-05-13 NOTE — Therapy (Signed)
[facility] [address] [place], [place], [address] Phone: [phone]   Fax:  [phone]  Physical Therapy Treatment  Patient Details  Name: Adam [name] MRN: [id number] Date of Birth: 17-Aug-1951 Referring Provider: Jill Alexanders, MD  Encounter Date: 05/13/2017      PT End of Session - 05/13/17 1112    Visit Number 11  G code done at visit 9   Number of Visits 17   Date for PT Re-Evaluation 06/19/17   Authorization Type Medicare G-code & progress note every 10th visit   PT Start Time 1103   PT Stop Time 1148   PT Time Calculation (min) 45 min   Equipment Utilized During Treatment --   Activity Tolerance Patient tolerated treatment well   Behavior During Therapy [facility] for tasks assessed/performed      Past Medical History:  Diagnosis Date  . Gangrene ([place]) 11/22/2015   right great toe  . Hypertension   . OSA on CPAP   . PAD (peripheral artery disease) ([place])   . Peripheral vascular disease ([place])   . Type II diabetes mellitus ([place]) dx'd 11/2015    Past Surgical History:  Procedure Laterality Date  . AMPUTATION Right 12/01/2015   Procedure: AMPUTATION RIGHT GREAT;  Surgeon: Conrad Marne, MD;  Location: [facility];  Service: Vascular;  Laterality: Right;  . AMPUTATION Right 12/16/2015   Procedure: AMPUTATION BELOW KNEE;  Surgeon: [name], MD;  Location: [place];  Service: Vascular;  Laterality: Right;  . AMPUTATION Right 12/26/2015   Procedure: RIGHT ABOVE KNEE AMPUTATION;  Surgeon: [name], MD;  Location: [place];  Service: Vascular;  Laterality: Right;  . COLONOSCOPY    . FEMORAL-POPLITEAL BYPASS GRAFT Right 11/28/2015   Procedure: RIGHT  FEMORAL-POPLITEAL ARTERY BYPASS GRAFT AND CONSTRUCTION OF MILLER CUFF USING 6 MM X 80 CM PROPATEN  GRAFT ;  Surgeon: Conrad [place]more, MD;  Location: [place];  Service: Vascular;  Laterality: Right;  . INGUINAL HERNIA REPAIR Bilateral 2000  . PERIPHERAL VASCULAR CATHETERIZATION N/A  11/26/2015   Procedure: Abdominal Aortogram;  Surgeon: [name], MD;  Location: [facility];  Service: Cardiovascular;  Laterality: N/A;  . VASECTOMY      There were no vitals filed for this visit.      Subjective Assessment - 05/13/17 1105    Subjective Patient reports [place] in[place] trouble. Patient reports he nearly lost his balance due to tripping over the cat, but he was able to catch himself due to microprocessor knee.    Pertinent History right TFA 12/26/2015, DM2, HTN, PVD/PAD   Limitations Lifting;Standing;Walking;House hold activities   Patient Stated Goals Use new microprocessor prosthesis walking on uneven terrain including golf.    Currently in Pain? No/denies                         [facility] Adult PT Treatment/Exercise - 05/13/17 1103      Transfers   Transfers Sit to Stand;Stand to Sit   Sit to Stand 6: Modified independent (Device/Increase time);With upper extremity assist;With armrests;From chair/3-in-1   Sit to Stand Details Verbal cues for technique   Stand to Sit 6: Modified independent (Device/Increase time);With upper extremity assist;With armrests;To chair/3-in-1   Stand to Sit Details (indicate cue type and reason) Verbal cues for technique   Comments --     Ambulation/Gait   Ambulation/Gait Yes  Ambulation/Gait Assistance 5: Supervision   Ambulation/Gait Assistance Details Requires cueing for weight shift and to decrease hip hiking via verbal cueing to pelvic rotation without rise and use of cloth toe cap to decrease terminal stance restriction in order to properly engage hydraulic knee.   Ambulation Distance (Feet) 500 Feet  >557f (multiple laps around track in clinic)    Assistive device Prosthesis   Gait Pattern Step-through pattern;Decreased arm swing - right;Decreased step length - left;Decreased stance time - right;Decreased stride length;Decreased hip/knee flexion - right;Right  circumduction;Right hip hike;Antalgic;Trunk rotated posteriorly on right;Abducted- right   Ambulation Surface Level;Indoor   Stairs Yes   Stairs Assistance 5: Supervision   Stairs Assistance Details (indicate cue type and reason) Performed blocked practice for proper weight shift and sequencing (always lead with L foot when descending) on stairs. Requires demo from PT for proper weight shift.   Stair Management Technique Step to pattern;Forwards;One rail Left  two rails + alternating + fwd   Number of Stairs 4  multiple reps (blocked practice)   Ramp 4: Min assist   Ramp Details (indicate cue type and reason) Blocked practice: utilized hand held assist on R side to foster weight shift. Requires demo and cueing for proper weight shift and sequencing to engage hydraulic knee   Gait Comments PT utilized a sock over toes of shoe on prosthetic foot to decrease friction on prosthetic toes to encourage patient to "drag" prosthetic toe to decrease hip hike. PT utilized 10# weight in patient's R hand to foster proper weight shift to enable patient to properly engage hydraulic knee.     Prosthetics   Prosthetic Care Comments  PT notes slight medial whip in gait. PT educated patient on proper prosthetic alignment based on knee joint.                 PT Education - 05/13/17 1129    Education provided Yes   Education Details waterproof leg protector (full leg, size M, vacuum seal) for boating to limit water damage to prosthesis    Person(s) Educated Patient   Methods Explanation;Demonstration;Tactile cues;Verbal cues   Comprehension Verbalized understanding          PT Short Term Goals - 05/13/17 2012      PT SHORT TERM GOAL #1   Title Patient will demonstrate an increase in gait velocity between comfortable pace adn fast pace gait velocity by >/= 0.540fs to indicate improvement in functional mobility. (TARGET DATE: 05/22/2017)    Time 4   Period Weeks   Status On-going     PT SHORT  TERM GOAL #2   Title Patient will demonstrate ability to retrieve an object from the floor by properly activating prosthetic knee with modified independence to indicate a decrease in his risk of falling. (TARGET DATE: 05/22/2017)    Time 4   Period Weeks   Status On-going           PT Long Term Goals - 05/13/17 2012      PT LONG TERM GOAL #1   Title Patient verbalizes & demonstrates understanding of prosthetic care / use of microprocessor unit to enable safe use of prosthesis. (Target Date: 04/24/2017)   Baseline MET 04/22/2017   Time 8   Period Weeks   Status Achieved     PT LONG TERM GOAL #2   Title Functional Gait Assessment > 19/ 30 to indicate lower fall risk with gait.  (NEW TARGET DATE: 06/19/2017)    Baseline On-going 04/22/2017  FGA = 17/30   Time 8   Period Weeks   Status On-going     PT LONG TERM GOAL #3   Title Berg Balance Test >/= 52/56 to indicate lower fall risk. (Target Date: 04/24/2017)   Baseline MET 04/22/2017 Merrilee Jansky Balance Test = 52/56   Time 8   Period Weeks   Status Achieved     PT LONG TERM GOAL #4   Title Patient ambulates 1000' with micorprocessor prosthesis outside including grass, ramps & curbs independent for community access.  (Target Date: 04/24/2017)   Baseline MET 04/22/2017   Time 8   Period Weeks   Status Achieved     PT LONG TERM GOAL #5   Title Patient able to increase gait velocity >0.58f/sec comfortable to fast pace.  (NEW TARGET DATE: 06/19/2017)    Baseline 04/22/2017 increase in gait velocity = 0.356fs    Time 8   Period Weeks   Status On-going     PT LONG TERM GOAL #6   Title Patient will demonstrate ability to ambulate on stairs with reciprocal gait with 1 rail and modified independence to indicate a decrease in risk of falling. (TARGET DATE: 06/19/2017)   Time 8   Period Weeks   Status On-going               Plan - 05/13/17 1154    Clinical Impression Statement Today's skilled PT session focused on advancing patient's prosthetic  gait training with emphasis on consistent, proper hydraulic knee engagement. Patient benefits from blocked practice with multiple verbal cues for technique and weight shift. Patient is making progress towards goals, and will benefit from continued skilled PT to address functional mobility deficits.     Rehab Potential Good   PT Frequency 1x / week   PT Duration 8 weeks   PT Treatment/Interventions ADLs/Self Care Home Management;Gait training;Stair training;Functional mobility training;Therapeutic activities;Therapeutic exercise;Balance training;Neuromuscular re-education;Patient/family education;Prosthetic Training   PT Next Visit Plan assess STGs (due 05/22/2017), progress prosthetic gait trianing with emphasis on inclines (try to decrease reliance on UE support, as appropriate) and stairs    Consulted and Agree with Plan of Care Patient      Patient will benefit from skilled therapeutic intervention in order to improve the following deficits and impairments:  Abnormal gait, Decreased balance, Decreased endurance, Postural dysfunction, Prosthetic Dependency, Decreased mobility  Visit Diagnosis: Other abnormalities of gait and mobility  Unsteadiness on feet     Problem List Patient Active Problem List   Diagnosis Date Noted  . Type 2 diabetes with complication ([place]0377/82/4235. Phantom pain after amputation of lower extremity ([facility]03/01/2016  . Neuropathic pain   . S/P AKA (above knee amputation), right ([facility]02/09/2016  . PAD (peripheral artery disease) ([facility]01/28/2017  . Hyperlipidemia associated with type 2 diabetes mellitus ([facility]01/20/2017  . Hypertension associated with diabetes ([facility]01/18/2016   ViArelia SneddonSPT 05/13/2017, 12:00 PM  WAJamey ReasPT, DPT  05/13/2017, 8:13 PM  [place] [place]uB[place]hone: [phone] Fax:  [phone]: StBARDIA WANGERINRN:  [id number] of Birth: [birth year]

## 2017-05-21 ENCOUNTER — Ambulatory Visit: Payer: Medicare Other | Attending: Family Medicine | Admitting: Physical Therapy

## 2017-05-21 ENCOUNTER — Encounter: Payer: Self-pay | Admitting: Physical Therapy

## 2017-05-21 DIAGNOSIS — R2681 Unsteadiness on feet: Secondary | ICD-10-CM | POA: Diagnosis not present

## 2017-05-21 DIAGNOSIS — R29898 Other symptoms and signs involving the musculoskeletal system: Secondary | ICD-10-CM | POA: Insufficient documentation

## 2017-05-21 DIAGNOSIS — R2689 Other abnormalities of gait and mobility: Secondary | ICD-10-CM

## 2017-05-21 NOTE — Therapy (Signed)
[facility] [address] [place], [place], [address] Phone: [phone]   Fax:  [phone]  Physical Therapy Treatment  Patient Details  Name: Adam [name] MRN: [id number] Date of Birth: [birth year] Referring Provider: Jill Alexanders, MD  Encounter Date: 05/21/2017      PT End of Session - 05/21/17 1224    Visit Number 12  G code done at visit 9   Number of Visits 17   Date for PT Re-Evaluation 06/19/17   Authorization Type Medicare G-code & progress note every 10th visit   PT Start Time 0930   PT Stop Time 1015   PT Time Calculation (min) 45 min   Activity Tolerance Patient tolerated treatment well   Behavior During Therapy [facility] for tasks assessed/performed      Past Medical History:  Diagnosis Date  . Gangrene ([place]) 11/22/2015   right great toe  . Hypertension   . OSA on CPAP   . PAD (peripheral artery disease) ([place])   . Peripheral vascular disease ([place])   . Type II diabetes mellitus ([place]) dx'd 11/2015    Past Surgical History:  Procedure Laterality Date  . AMPUTATION Right 12/01/2015   Procedure: AMPUTATION RIGHT GREAT;  Surgeon: Conrad Moline, MD;  Location: [facility];  Service: Vascular;  Laterality: Right;  . AMPUTATION Right 12/16/2015   Procedure: AMPUTATION BELOW KNEE;  Surgeon: [name], MD;  Location: [facility];  Service: Vascular;  Laterality: Right;  . AMPUTATION Right 12/26/2015   Procedure: RIGHT ABOVE KNEE AMPUTATION;  Surgeon: [name], MD;  Location: [facility];  Service: Vascular;  Laterality: Right;  . COLONOSCOPY    . FEMORAL-POPLITEAL BYPASS GRAFT Right 11/28/2015   Procedure: RIGHT  FEMORAL-POPLITEAL ARTERY BYPASS GRAFT AND CONSTRUCTION OF MILLER CUFF USING 6 MM X 80 CM PROPATEN  GRAFT ;  Surgeon: Conrad [place], MD;  Location: [facility];  Service: Vascular;  Laterality: Right;  . INGUINAL HERNIA REPAIR Bilateral 2000  . PERIPHERAL VASCULAR CATHETERIZATION N/A 11/26/2015   Procedure: Abdominal Aortogram;   Surgeon: [name], MD;  Location: [facility];  Service: Cardiovascular;  Laterality: N/A;  . VASECTOMY      There were no vitals filed for this visit.      Subjective Assessment - 05/21/17 0933    Subjective Denies falls.    Pertinent History right TFA 12/26/2015, DM2, HTN, PVD/PAD   Limitations Lifting;Standing;Walking;House hold activities   Patient Stated Goals Use new microprocessor prosthesis walking on uneven terrain including golf.    Currently in Pain? No/denies                         [facility] Adult PT Treatment/Exercise - 05/21/17 0930      Ambulation/Gait   Ambulation/Gait Yes   Ambulation/Gait Assistance 5: Supervision   Ambulation/Gait Assistance Details manual cues at pelvis for rotation to advance prosthesis and minimize hip hiking   Ambulation Distance (Feet) 500 Feet   Assistive device Prosthesis   Ambulation Surface Indoor;Level   Gait velocity 2.88 ft/sec comfortable, 3.66 ft/sec fast pace   Pre-Gait Activities in parallel bars for safety & UE support: progressed from BUE to single UE with activities.  standing crossways on foam beam stepping anteriorly to floor with prosthetic knee flexion loading weight onto prosthesis, progressed to step down & walk forward. Stepping off with LLE so riding hydraulics for 2" difference with verbal cues.      Therapeutic  Activites    Lifting Patient able to retrieve 2# object from floor engaging hydraulics.                   PT Short Term Goals - 05/21/17 1225      PT SHORT TERM GOAL #1   Title Patient will demonstrate an increase in gait velocity between comfortable pace adn fast pace gait velocity by >/= 0.46f/s to indicate improvement in functional mobility. (TARGET DATE: 05/22/2017)    Baseline MET 05/21/2017  Gait Velocity comfortable 2.831fsec to fast 3.6645fec for 0.78 ft/sec increase   Time 4   Period Weeks   Status Achieved     PT SHORT TERM GOAL #2   Title Patient will  demonstrate ability to retrieve an object from the floor by properly activating prosthetic knee with modified independence to indicate a decrease in his risk of falling. (TARGET DATE: 05/22/2017)    Baseline MET 05/21/2017   Time 4   Period Weeks   Status Achieved           PT Long Term Goals - 05/13/17 2012      PT LONG TERM GOAL #1   Title Patient verbalizes & demonstrates understanding of prosthetic care / use of microprocessor unit to enable safe use of prosthesis. (Target Date: 04/24/2017)   Baseline MET 04/22/2017   Time 8   Period Weeks   Status Achieved     PT LONG TERM GOAL #2   Title Functional Gait Assessment > 19/ 30 to indicate lower fall risk with gait.  (NEW TARGET DATE: 06/19/2017)    Baseline On-going 04/22/2017  FGA = 17/30   Time 8   Period Weeks   Status On-going     PT LONG TERM GOAL #3   Title Berg Balance Test >/= 52/56 to indicate lower fall risk. (Target Date: 04/24/2017)   Baseline MET 04/22/2017 BerMerrilee Janskylance Test = 52/56   Time 8   Period Weeks   Status Achieved     PT LONG TERM GOAL #4   Title Patient ambulates 1000' with micorprocessor prosthesis outside including grass, ramps & curbs independent for community access.  (Target Date: 04/24/2017)   Baseline MET 04/22/2017   Time 8   Period Weeks   Status Achieved     PT LONG TERM GOAL #5   Title Patient able to increase gait velocity >0.8ft42fc comfortable to fast pace.  (NEW TARGET DATE: 06/19/2017)    Baseline 04/22/2017 increase in gait velocity = 0.36ft4f  Time 8   Period Weeks   Status On-going     PT LONG TERM GOAL #6   Title Patient will demonstrate ability to ambulate on stairs with reciprocal gait with 1 rail and modified independence to indicate a decrease in risk of falling. (TARGET DATE: 06/19/2017)   Time 8   Period Weeks   Status On-going               Plan - 05/21/17 1227    Clinical Impression Statement Patient met 2 of 2 STGs. He improved ability to engage prosthesis with initial  weight bearing & terminal stance flexion with pre-gait activities in parallel bars. Patient was able to carryover in gait with PT tactile cues at pelvis.    Rehab Potential Good   PT Frequency 1x / week   PT Duration 8 weeks   PT Treatment/Interventions ADLs/Self Care Home Management;Gait training;Stair training;Functional mobility training;Therapeutic activities;Therapeutic exercise;Balance training;Neuromuscular re-education;Patient/family education;Prosthetic Training   PT Next Visit  Plan progress prosthetic gait trianing with emphasis on inclines (try to decrease reliance on UE support, as appropriate) and stairs    Consulted and Agree with Plan of Care Patient      Patient will benefit from skilled therapeutic intervention in order to improve the following deficits and impairments:  Abnormal gait, Decreased balance, Decreased endurance, Postural dysfunction, Prosthetic Dependency, Decreased mobility  Visit Diagnosis: Other abnormalities of gait and mobility  Unsteadiness on feet     Problem List Patient Active Problem List   Diagnosis Date Noted  . Type 2 diabetes with complication ([place]) 15/61/5379  . Phantom pain after amputation of lower extremity ([place]) 01/18/2016  . Neuropathic pain   . S/P AKA (above knee amputation), right ([place]) 12/29/2015  . PAD (peripheral artery disease) ([place]) 12/15/2015  . Hyperlipidemia associated with type 2 diabetes mellitus ([place]) 12/07/2015  . Hypertension associated with diabetes ([place]) 12/04/2014    Nery Frappier PT, DPT 05/21/2017, 12:29 PM  [facility] [address] [place], [place], [address] Phone: [phone]   Fax:  [phone]  Name: Adam [name] MRN: [id number] Date of Birth: 1951/10/02

## 2017-05-26 ENCOUNTER — Encounter: Payer: Self-pay | Admitting: Family Medicine

## 2017-05-26 ENCOUNTER — Ambulatory Visit (INDEPENDENT_AMBULATORY_CARE_PROVIDER_SITE_OTHER): Payer: Medicare Other | Admitting: Family Medicine

## 2017-05-26 VITALS — BP 130/80 | HR 78 | Ht 71.0 in | Wt 188.0 lb

## 2017-05-26 DIAGNOSIS — E7439 Other disorders of intestinal carbohydrate absorption: Secondary | ICD-10-CM

## 2017-05-26 DIAGNOSIS — I739 Peripheral vascular disease, unspecified: Secondary | ICD-10-CM

## 2017-05-26 DIAGNOSIS — Z89611 Acquired absence of right leg above knee: Secondary | ICD-10-CM

## 2017-05-26 DIAGNOSIS — I10 Essential (primary) hypertension: Secondary | ICD-10-CM

## 2017-05-26 DIAGNOSIS — R7309 Other abnormal glucose: Secondary | ICD-10-CM | POA: Diagnosis not present

## 2017-05-26 DIAGNOSIS — G546 Phantom limb syndrome with pain: Secondary | ICD-10-CM

## 2017-05-26 DIAGNOSIS — E785 Hyperlipidemia, unspecified: Secondary | ICD-10-CM

## 2017-05-26 LAB — POCT GLYCOSYLATED HEMOGLOBIN (HGB A1C): Hemoglobin A1C: 5.5

## 2017-05-26 MED ORDER — AMLODIPINE BESYLATE 10 MG PO TABS: 10.0000 mg | ORAL_TABLET | Freq: Every day | ORAL | 3 refills | Status: DC

## 2017-05-26 MED ORDER — LOSARTAN POTASSIUM-HCTZ 100-12.5 MG PO TABS: 1.0000 | ORAL_TABLET | Freq: Every day | ORAL | 3 refills | Status: DC

## 2017-05-26 NOTE — Progress Notes (Signed)
Subjective:   HPI  Adam [name] is a 66 y.o. male who presents for Chief Complaint  Patient presents with  . Medicare Wellness    medcheck plus    Medical care team includes: Denita Lung, MD here for primary care    Preventative care:  Last ophthalmology visit: in the last 3 month Last dental visit: couple years Last colonoscopy: Dr.mann Last prostate exam:  Last EKG:12/26/15 Last labs:10/01/16  Prior vaccinations:  TD or Tdap:10/05/14 Influenza:10/01/16 Pneumococcal:  10/01/16 Shingles/Zostavax:10/05/14   Advanced directive: Yes. Copy asked for.  Concerns: He does have underlying high blood pressure and is presently on amlodipine as well as Hyzaar and is doing fine on that. He continues on Lexapro and at this time is not interested in stopping. Lipitor is causing no difficulty. He now has a new prosthetic and is much more technically advance PT is getting used to using this. He is not having much trouble with Phantom limb syndrome. Further review of his record concerning diabetes indicates that he had one hemoglobin A1c of 7.1 however review of the other numbers around that same time indicates that that particular reading was not accurate in that All the other readings were 5.7 or 5.9. Reviewed their medical, surgical, family, social, medication, and allergy history and updated chart as appropriate.  Past Medical History:  Diagnosis Date  . Gangrene ([place]) 11/22/2015   right great toe  . Hypertension   . OSA on CPAP   . PAD (peripheral artery disease) ([place])   . Peripheral vascular disease ([place])   . Type II diabetes mellitus ([place]) dx'd 11/2015    Past Surgical History:  Procedure Laterality Date  . AMPUTATION Right 12/01/2015   Procedure: AMPUTATION RIGHT GREAT;  Surgeon: Conrad [place], MD;  Location: [facility];  Service: Vascular;  Laterality: Right;  . AMPUTATION Right 12/16/2015   Procedure: AMPUTATION BELOW KNEE;  Surgeon: [name], MD;  Location: [facility];   Service: Vascular;  Laterality: Right;  . AMPUTATION Right 12/26/2015   Procedure: RIGHT ABOVE KNEE AMPUTATION;  Surgeon: [name], MD;  Location: [facility];  Service: Vascular;  Laterality: Right;  . COLONOSCOPY    . FEMORAL-POPLITEAL BYPASS GRAFT Right 11/28/2015   Procedure: RIGHT  FEMORAL-POPLITEAL ARTERY BYPASS GRAFT AND CONSTRUCTION OF MILLER CUFF USING 6 MM X 80 CM PROPATEN  GRAFT ;  Surgeon: Conrad , MD;  Location: [facility];  Service: Vascular;  Laterality: Right;  . INGUINAL HERNIA REPAIR Bilateral 2000  . PERIPHERAL VASCULAR CATHETERIZATION N/A 11/26/2015   Procedure: Abdominal Aortogram;  Surgeon: [name], MD;  Location: [facility];  Service: Cardiovascular;  Laterality: N/A;  . VASECTOMY      Social History   Social History  . Marital status: Married    Spouse name: N/A  . Number of children: N/A  . Years of education: N/A   Occupational History  . Not on file.   Social History Main Topics  . Smoking status: Former Smoker    Packs/day: 0.75    Years: 40.00    Types: Cigarettes    Quit date: 10/06/2011  . Smokeless tobacco: Never Used  . Alcohol use 4.2 oz/week    7 Glasses of wine per week  . Drug use: No  . Sexual activity: Not Currently   Other Topics Concern  . Not on file   Social History Narrative  . No narrative on file    Family History  Problem Relation Age  of Onset  . Hypertension Mother      Current Outpatient Prescriptions:  .  amLODipine (NORVASC) 5 MG tablet, TAKE 2 TABLETS (10 MG TOTAL) BY MOUTH DAILY., Disp: 60 tablet, Rfl: 1 .  aspirin 81 MG tablet, Take 81 mg by mouth daily., Disp: , Rfl:  .  atorvastatin (LIPITOR) 10 MG tablet, Take 1 tablet (10 mg total) by mouth daily. (Patient taking differently: Take 10 mg by mouth daily. Pt takes three times a week), Disp: 90 tablet, Rfl: 3 .  escitalopram (LEXAPRO) 10 MG tablet, TAKE 1 TABLET BY MOUTH EVERY DAY, Disp: 90 tablet, Rfl: 0 .  losartan-hydrochlorothiazide (HYZAAR)  100-12.5 MG tablet, TAKE 1 TABLET BY MOUTH DAILY., Disp: 90 tablet, Rfl: 1 .  Multiple Vitamin (MULTIVITAMIN) tablet, Take 1 tablet by mouth daily., Disp: , Rfl:  .  ONE TOUCH ULTRA TEST test strip, , Disp: , Rfl:   No Known Allergies     Review of Systems Negative except as above    Objective:   General appearance: alert, no distress, WD/WN, Caucasian male Hemoglobin A1c is 5.5  Assessment and Plan :    PAD (peripheral artery disease) (H[facility])  S/P AKA (above knee amputation), right ([facility])  Phantom pain after amputation of lower extremity ([facility])  Glucose intolerance  Hyperlipidemia, unspecified hyperlipidemia type  Essential hypertension Recommend he go to the drugstore to get Shingrix . As mentioned above I think the one reading of an A1c of 7.1 was inaccurate and therefore have relabeled him as glucose intolerant. I will continue to follow this. He does recognize the fact that he could easily become diabetic down the road. Recheck here in roughly 6 months. Continue on present medication regimen.

## 2017-05-27 ENCOUNTER — Encounter: Payer: Self-pay | Admitting: Family Medicine

## 2017-05-27 ENCOUNTER — Ambulatory Visit: Payer: Medicare Other | Admitting: Physical Therapy

## 2017-05-27 ENCOUNTER — Encounter: Payer: Self-pay | Admitting: Physical Therapy

## 2017-05-27 DIAGNOSIS — R2681 Unsteadiness on feet: Secondary | ICD-10-CM | POA: Diagnosis not present

## 2017-05-27 DIAGNOSIS — R2689 Other abnormalities of gait and mobility: Secondary | ICD-10-CM | POA: Diagnosis not present

## 2017-05-27 DIAGNOSIS — R29898 Other symptoms and signs involving the musculoskeletal system: Secondary | ICD-10-CM | POA: Diagnosis not present

## 2017-05-27 NOTE — Therapy (Signed)
[facility] [address] [place], [place], [address] Phone: [phone]   Fax:  [phone]  Physical Therapy Treatment  Patient Details  Name: KAMIL MCHAFFIE MRN: [id number] Date of Birth: 31-Jan-1951 Referring Provider: Jill Alexanders, MD  Encounter Date: 05/27/2017      PT End of Session - 05/27/17 1206    Visit Number 13  G code done at visit 9   Number of Visits 17   Date for PT Re-Evaluation 06/19/17   Authorization Type Medicare G-code & progress note every 10th visit   PT Start Time 0932   PT Stop Time 1016   PT Time Calculation (min) 44 min   Equipment Utilized During Treatment Gait belt   Activity Tolerance Patient tolerated treatment well   Behavior During Therapy [facility] for tasks assessed/performed      Past Medical History:  Diagnosis Date  . Gangrene ([place]) 11/22/2015   right great toe  . Hypertension   . OSA on CPAP   . PAD (peripheral artery disease) ([place])   . Peripheral vascular disease ([place])   . Type II diabetes mellitus ([place]) dx'd 11/2015    Past Surgical History:  Procedure Laterality Date  . AMPUTATION Right 12/01/2015   Procedure: AMPUTATION RIGHT GREAT;  Surgeon: Conrad [place], MD;  Location: [facility];  Service: Vascular;  Laterality: Right;  . AMPUTATION Right 12/16/2015   Procedure: AMPUTATION BELOW KNEE;  Surgeon: [name], MD;  [place]  Service: Vascular;  Laterality: Right;  . AMPUTATION Right 12/26/2015   Procedure: RIGHT ABOVE KNEE AMPUTATION;  Surgeon: [name], MD;  Location: [facility];  Service: Vascular;  Laterality: Right;  . COLONOSCOPY    . FEMORAL-POPLITEAL BYPASS GRAFT Right 11/28/2015   Procedure: RIGHT  FEMORAL-POPLITEAL ARTERY BYPASS GRAFT AND CONSTRUCTION OF MILLER CUFF USING 6 MM X 80 CM PROPATEN  GRAFT ;  Surgeon: Conrad , MD;  Location: [facility];  Service: Vascular;  Laterality: Right;  . INGUINAL HERNIA REPAIR Bilateral 2000  . PERIPHERAL VASCULAR  CATHETERIZATION N/A 11/26/2015   Procedure: Abdominal Aortogram;  Surgeon: [name], MD;  Location: [facility];  Service: Cardiovascular;  Laterality: N/A;  . VASECTOMY      There were no vitals filed for this visit.      Subjective Assessment - 05/27/17 0934    Subjective Patient reports he had an appointment with prosthetist. Adjustments were made, and prosthetist was pleased following appointment. Denies any changes or falls. He reports he has lost his balance multiple times, but he is not fully falling due to his leg now "catching" him.    Pertinent History right TFA 12/26/2015, DM2, HTN, PVD/PAD   Limitations Lifting;Standing;Walking;House hold activities   Patient Stated Goals Use new microprocessor prosthesis walking on uneven terrain including golf.    Currently in Pain? No/denies                         [facility] Adult PT Treatment/Exercise - 05/27/17 0930      Ambulation/Gait   Ambulation/Gait Yes   Ambulation/Gait Assistance 5: Supervision   Ambulation/Gait Assistance Details Requires demo and verbal cueing to "allow prosthesis to drift behind" patient to encourage hydraulic knee engagement coupled with manual facilitation to foster proper weight shift and timing.    Ambulation Distance (Feet) 500 Feet  > 500 ft (mulitple laps around clinic)    Assistive device Prosthesis   Gait Pattern Step-through  pattern;Decreased arm swing - right;Decreased step length - left;Decreased stance time - right;Decreased stride length;Decreased hip/knee flexion - right;Right circumduction;Right hip hike;Antalgic;Trunk rotated posteriorly on right;Abducted- right   Ambulation Surface Level;Indoor   Gait velocity --   Ramp 4: Min assist   Ramp Details (indicate cue type and reason) Performed blocked practice. Requires verbal cueing to not hip hike, to allow prosthesis to "drift behind him" to foster knee engagement, and manual facilitation for weight shift (fwd) and  timing. Added visual cues on incline to walk between to decrease hip hike/abduction and to foster knee engagement. Utilized Geologist, engineering for patient to have visual feedback for proper knee engagement.      Therapeutic Activites    Lifting --     Neuro Re-ed    Neuro Re-ed Details  Instructed patient in performing fwd weight shift over L leg in fwd step position on incline to work on unlocking prosthetic knee in terminal stance. Required hand held assist from PT (min A - mod A), demo for technique, and verbal cueing for forward weight shift. Progressed to performing fwd weight shift + stepping up incline.      Exercises   Exercises Knee/Hip     Knee/Hip Exercises: Aerobic   Tread Mill Performed ambulation on treadmill. Started with level surface ambulation with manual facilitation from PT at patient's pelvis for weight shift and timing for R hip activation. Started at 1.46mh, and slowly increased speed in increments of 0.229m until top speed of 2.27m51m Progressed to 9% incline (up) at 1.6mp42mWhile ambulating up incline, required 1 episode of max A due to misstep with prosthesis.  Progressed to ambulating down 0% incline at 1.5mph74mth mirror. While on inclines (up & down) continued manual facilitation from PT and verbal cueing to "pull" R hip forward to properly engage prosthetic knee. Briefly (for 20-30s) progressed to 15% incline when ambulating down ramp due to patient's report of having a steep incline on driveway. Patient requires heavy use of BUE support. Performed 12 minutes of treadmill ambulation (total).                   PT Short Term Goals - 05/21/17 1225      PT SHORT TERM GOAL #1   Title Patient will demonstrate an increase in gait velocity between comfortable pace adn fast pace gait velocity by >/= 0.5ft/s43f indicate improvement in functional mobility. (TARGET DATE: 05/22/2017)    Baseline MET 05/21/2017  Gait Velocity comfortable 2.88ft/s55fo fast 3.66ft/se3fr 0.78 ft/sec  increase   Time 4   Period Weeks   Status Achieved     PT SHORT TERM GOAL #2   Title Patient will demonstrate ability to retrieve an object from the floor by properly activating prosthetic knee with modified independence to indicate a decrease in his risk of falling. (TARGET DATE: 05/22/2017)    Baseline MET 05/21/2017   Time 4   Period Weeks   Status Achieved           PT Long Term Goals - 05/13/17 2012      PT LONG TERM GOAL #1   Title Patient verbalizes & demonstrates understanding of prosthetic care / use of microprocessor unit to enable safe use of prosthesis. (Target Date: 04/24/2017)   Baseline MET 04/22/2017   Time 8   Period Weeks   Status Achieved     PT LONG TERM GOAL #2   Title Functional Gait Assessment > 19/ 30 to indicate lower fall risk with  gait.  (NEW TARGET DATE: 06/19/2017)    Baseline On-going 04/22/2017  FGA = 17/30   Time 8   Period Weeks   Status On-going     PT LONG TERM GOAL #3   Title Berg Balance Test >/= 52/56 to indicate lower fall risk. (Target Date: 04/24/2017)   Baseline MET 04/22/2017 Merrilee Jansky Balance Test = 52/56   Time 8   Period Weeks   Status Achieved     PT LONG TERM GOAL #4   Title Patient ambulates 1000' with micorprocessor prosthesis outside including grass, ramps & curbs independent for community access.  (Target Date: 04/24/2017)   Baseline MET 04/22/2017   Time 8   Period Weeks   Status Achieved     PT LONG TERM GOAL #5   Title Patient able to increase gait velocity >0.74f/sec comfortable to fast pace.  (NEW TARGET DATE: 06/19/2017)    Baseline 04/22/2017 increase in gait velocity = 0.371fs    Time 8   Period Weeks   Status On-going     PT LONG TERM GOAL #6   Title Patient will demonstrate ability to ambulate on stairs with reciprocal gait with 1 rail and modified independence to indicate a decrease in risk of falling. (TARGET DATE: 06/19/2017)   Time 8   Period Weeks   Status On-going               Plan - 05/27/17 1212    Clinical  Impression Statement Today's skilled PT session focused on advancing patient's prosthetic gait with emphasis on hydraulic knee engagement when on an incline. Patient has improved on his ability to engage his hydraulic knee on level surfaces, but requires manual facilitation from PT, which provides both timing, weight shift, and security, to consistently engage hydraulic knee on an incline. Patient remains inconsistent with his knee engagement on inclines without PT providing manual facilitation. Patient demonstrates better consistent knee engagement when ascending inclines versus descending inclines. Patient is making progress towards goals, and will benefit from continued skilled PT to address functional mobility deficits.    Rehab Potential Good   PT Frequency 1x / week   PT Duration 8 weeks   PT Treatment/Interventions ADLs/Self Care Home Management;Gait training;Stair training;Functional mobility training;Therapeutic activities;Therapeutic exercise;Balance training;Neuromuscular re-education;Patient/family education;Prosthetic Training   PT Next Visit Plan progres prosthetic gait training with emphasis on hydraulic knee engagement on inclines (reducing reliance on PT providing manual facilitation) and stairs    Consulted and Agree with Plan of Care Patient      Patient will benefit from skilled therapeutic intervention in order to improve the following deficits and impairments:  Abnormal gait, Decreased balance, Decreased endurance, Postural dysfunction, Prosthetic Dependency, Decreased mobility  Visit Diagnosis: Other abnormalities of gait and mobility  Unsteadiness on feet     Problem List Patient Active Problem List   Diagnosis Date Noted  . Glucose intolerance 01/18/2016  . Phantom pain after amputation of lower extremity ([facility]03/01/2016  . S/P AKA (above knee amputation), right ([facility]02/09/2016  . PAD (peripheral artery disease) ([facility]01/28/2017  . Hyperlipidemia 12/07/2015  .  Hypertension 12/04/2014    ViArelia SneddonSPT  05/27/2017, 1:45 PM  [facility]hone: [phone] Fax:  [phone]: StCHAYNCE SCHAFERRN: [id number] of Birth: [birth year]

## 2017-06-03 ENCOUNTER — Ambulatory Visit: Payer: Medicare Other

## 2017-06-04 ENCOUNTER — Encounter: Payer: Self-pay | Admitting: Physical Therapy

## 2017-06-04 ENCOUNTER — Ambulatory Visit: Payer: Medicare Other | Admitting: Physical Therapy

## 2017-06-04 DIAGNOSIS — R29898 Other symptoms and signs involving the musculoskeletal system: Secondary | ICD-10-CM

## 2017-06-04 DIAGNOSIS — R2689 Other abnormalities of gait and mobility: Secondary | ICD-10-CM | POA: Diagnosis not present

## 2017-06-04 DIAGNOSIS — R2681 Unsteadiness on feet: Secondary | ICD-10-CM | POA: Diagnosis not present

## 2017-06-04 NOTE — Therapy (Signed)
[facility] [address] [place], [place], [address] Phone: [phone]   Fax:  [phone]  Physical Therapy Treatment  Patient Details  Name: Adam [name] MRN: [id number] Date of Birth: 1951-08-08 Referring Provider: Sharlot Gowda, MD  Encounter Date: 06/04/2017      PT End of Session - 06/04/17 2108    Visit Number 14  G code done at visit 9   Number of Visits 17   Date for PT Re-Evaluation 06/19/17   Authorization Type Medicare G-code & progress note every 10th visit   PT Start Time 1100   PT Stop Time 1145   PT Time Calculation (min) 45 min   Equipment Utilized During Treatment Gait belt   Activity Tolerance Patient tolerated treatment well   Behavior During Therapy [facility] for tasks assessed/performed      Past Medical History:  Diagnosis Date  . Gangrene (HCC) 11/22/2015   right great toe  . Hypertension   . OSA on CPAP   . PAD (peripheral artery disease) (HCC)   . Peripheral vascular disease (HCC)   . Type II diabetes mellitus (HCC) dx'd 11/2015    Past Surgical History:  Procedure Laterality Date  . AMPUTATION Right 12/01/2015   Procedure: AMPUTATION RIGHT GREAT;  Surgeon: Fransisco Hertz, MD;  Location: [facility] OR;  Service: Vascular;  Laterality: Right;  . AMPUTATION Right 12/16/2015   Procedure: AMPUTATION BELOW KNEE;  Surgeon: Larina Earthly, MD;  Location: [facility] OR;  Service: Vascular;  Laterality: Right;  . AMPUTATION Right 12/26/2015   Procedure: RIGHT ABOVE KNEE AMPUTATION;  Surgeon: Larina Earthly, MD;  Location: [facility] OR;  Service: Vascular;  Laterality: Right;  . COLONOSCOPY    . FEMORAL-POPLITEAL BYPASS GRAFT Right 11/28/2015   Procedure: RIGHT  FEMORAL-POPLITEAL ARTERY BYPASS GRAFT AND CONSTRUCTION OF MILLER CUFF USING 6 MM X 80 CM PROPATEN  GRAFT ;  Surgeon: Fransisco Hertz, MD;  Location: [facility] OR;  Service: Vascular;  Laterality: Right;  . INGUINAL HERNIA REPAIR Bilateral 2000  . PERIPHERAL VASCULAR  CATHETERIZATION N/A 11/26/2015   Procedure: Abdominal Aortogram;  Surgeon: Chuck Hint, MD;  Location: [facility] INVASIVE CV LAB;  Service: Cardiovascular;  Laterality: N/A;  . VASECTOMY      There were no vitals filed for this visit.      Subjective Assessment - 06/04/17 1104    Subjective He fell when left leg was taggled in hose of pressure washer. Only minor scrape of left knee.    Pertinent History right TFA 12/26/2015, DM2, HTN, PVD/PAD   Limitations Lifting;Standing;Walking;House hold activities   Patient Stated Goals Use new microprocessor prosthesis walking on uneven terrain including golf.    Currently in Pain? No/denies                         [facility] Adult PT Treatment/Exercise - 06/04/17 1100      Ambulation/Gait   Ambulation/Gait Yes   Ambulation/Gait Assistance 5: Supervision   Ambulation/Gait Assistance Details Tactile & verbal cues on decreased hip hiking and circumduction. Using 12" tiles on floor to ambulate with narrow base. Use of red theraband tied figure 8 around thighs for tactile cues for decreasing abduction.    Ambulation Distance (Feet) 500 Feet  > 500 ft X 2   Assistive device Prosthesis   Gait Pattern Step-through pattern;Decreased arm swing - right;Decreased step length - left;Decreased stance time - right;Decreased stride length;Decreased hip/knee flexion - right;Right circumduction;Right hip hike;Antalgic;Trunk  rotated posteriorly on right;Abducted- right   Ambulation Surface Indoor;Level;Outdoor;Grass   Stairs Yes   Stairs Assistance 5: Supervision   Stairs Assistance Details (indicate cue type and reason) verbal cues on prosthetic knee control descending stairs reciprocally with 2 rails.    Stair Management Technique Two rails;Alternating pattern;Forwards   Number of Stairs 5  multiple reps   Ramp 4: Min assist   Ramp Details (indicate cue type and reason) Tactile cues on knee control ascending & descending.     Neuro Re-ed     Neuro Re-ed Details  --     Exercises   Exercises --     Knee/Hip Exercises: Aerobic   Tread Mill Performed ambulation on treadmill. Started with level surface ambulation with manual facilitation from PT at patient's pelvis for weight shift and timing for R hip activation. Started at 1.21mh, and slowly increased speed in increments of 0.274m until top speed of 2.77m38m Progressed to 10% incline (up) at 1.6mp15mProgressed to ambulating down 10% incline at 1.6mph12mth mirror. While on inclines (up & down) continued manual facilitation from PT and verbal cueing to "pull" R hip forward to properly engage prosthetic knee. Performed 15 minutes of treadmill ambulation (total).                   PT Short Term Goals - 05/21/17 1225      PT SHORT TERM GOAL #1   Title Patient will demonstrate an increase in gait velocity between comfortable pace adn fast pace gait velocity by >/= 0.5ft/s76f indicate improvement in functional mobility. (TARGET DATE: 05/22/2017)    Baseline MET 05/21/2017  Gait Velocity comfortable 2.88ft/s41fo fast 3.66ft/se28fr 0.78 ft/sec increase   Time 4   Period Weeks   Status Achieved     PT SHORT TERM GOAL #2   Title Patient will demonstrate ability to retrieve an object from the floor by properly activating prosthetic knee with modified independence to indicate a decrease in his risk of falling. (TARGET DATE: 05/22/2017)    Baseline MET 05/21/2017   Time 4   Period Weeks   Status Achieved           PT Long Term Goals - 06/04/17 2108      PT LONG TERM GOAL #2   Title Functional Gait Assessment > 19/ 30 to indicate lower fall risk with gait.  (NEW TARGET DATE: 06/19/2017)    Baseline On-going 04/22/2017  FGA = 17/30   Time 8   Period Weeks   Status On-going     PT LONG TERM GOAL #3   Title Patient negotiates ramps & curbs with prosthetic knee control independently. (Target Date: 06/19/2017)   Status On-going     PT LONG TERM GOAL #4   Title Patient ambulates with  minimal hip hiking and circumduction. (Target Date: 06/19/2017)   Status On-going     PT LONG TERM GOAL #5   Title Patient able to increase gait velocity >0.8ft/sec 24ffortable to fast pace.  (NEW TARGET DATE: 06/19/2017)    Baseline 04/22/2017 increase in gait velocity = 0.36ft/s   57fe 8   Period Weeks   Status On-going     PT LONG TERM GOAL #6   Title Patient will demonstrate ability to ambulate on stairs with reciprocal gait with 1 rail and modified independence to indicate a decrease in risk of falling. (TARGET DATE: 06/19/2017)   Time 8   Period Weeks   Status On-going  Plan - 06/04/17 2112    Clinical Impression Statement Patient improved gait on level surfaces and on grass with less hip hiking and circumduction after skilled session.  Patient improved ability to descend stairs reciprocally.    Rehab Potential Good   PT Frequency 1x / week   PT Duration 8 weeks   PT Treatment/Interventions ADLs/Self Care Home Management;Gait training;Stair training;Functional mobility training;Therapeutic activities;Therapeutic exercise;Balance training;Neuromuscular re-education;Patient/family education;Prosthetic Training   PT Next Visit Plan progres prosthetic gait training with emphasis on hydraulic knee engagement on inclines (reducing reliance on PT providing manual facilitation) and stairs    Consulted and Agree with Plan of Care Patient      Patient will benefit from skilled therapeutic intervention in order to improve the following deficits and impairments:  Abnormal gait, Decreased balance, Decreased endurance, Postural dysfunction, Prosthetic Dependency, Decreased mobility  Visit Diagnosis: Other abnormalities of gait and mobility  Unsteadiness on feet  Other symptoms and signs involving the musculoskeletal system     Problem List Patient Active Problem List   Diagnosis Date Noted  . Glucose intolerance 01/18/2016  . Phantom pain after amputation of lower  extremity ([place]) 01/18/2016  . S/P AKA (above knee amputation), right ([place]) 12/29/2015  . PAD (peripheral artery disease) ([place]) 12/15/2015  . Hyperlipidemia 12/07/2015  . Hypertension 12/04/2014    Chelsea Pedretti PT, DPT 06/04/2017, 9:15 PM  [facility] [address] [place], [place], [address] Phone: [phone]   Fax:  [phone]  Name: Adam [name] MRN: [id number] Date of Birth: [birth year]

## 2017-06-10 ENCOUNTER — Encounter: Payer: Self-pay | Admitting: Physical Therapy

## 2017-06-10 ENCOUNTER — Ambulatory Visit: Payer: Medicare Other | Admitting: Physical Therapy

## 2017-06-10 DIAGNOSIS — R29898 Other symptoms and signs involving the musculoskeletal system: Secondary | ICD-10-CM | POA: Diagnosis not present

## 2017-06-10 DIAGNOSIS — R2681 Unsteadiness on feet: Secondary | ICD-10-CM | POA: Diagnosis not present

## 2017-06-10 DIAGNOSIS — R2689 Other abnormalities of gait and mobility: Secondary | ICD-10-CM

## 2017-06-10 NOTE — Therapy (Signed)
[facility] [address] [place], [place], [address] Phone: [phone]   Fax:  [phone]  Physical Therapy Treatment  Patient Details  Name: Adam [name] MRN: [id number] Date of Birth: 23-Feb-1951 Referring Provider: Jill Alexanders, MD  Encounter Date: 06/10/2017      PT End of Session - 06/10/17 0935    Visit Number 15  G code done at visit 9   Number of Visits 17   Date for PT Re-Evaluation 06/19/17   Authorization Type Medicare G-code & progress note every 10th visit   PT Start Time 0932   PT Stop Time 1013   PT Time Calculation (min) 41 min   Equipment Utilized During Treatment Gait belt   Activity Tolerance Patient tolerated treatment well   Behavior During Therapy [facility] for tasks assessed/performed      Past Medical History:  Diagnosis Date  . Gangrene ([place]) 11/22/2015   right great toe  . Hypertension   . OSA on CPAP   . PAD (peripheral artery disease) ([place])   . Peripheral vascular disease ([place])   . Type II diabetes mellitus ([place]) dx'd 11/2015    Past Surgical History:  Procedure Laterality Date  . AMPUTATION Right 12/01/2015   Procedure: AMPUTATION RIGHT GREAT;  Surgeon: Conrad Seaman, MD;  Location: [place];  Service: Vascular;  Laterality: Right;  . AMPUTATION Right 12/16/2015   Procedure: AMPUTATION BELOW KNEE;  Surgeon: [name], MD;  Location: [place];  Service: Vascular;  Laterality: Right;  . AMPUTATION Right 12/26/2015   Procedure: RIGHT ABOVE KNEE AMPUTATION;  Surgeon: [name], MD;  Location: [place];  Service: Vascular;  Laterality: Right;  . COLONOSCOPY    . FEMORAL-POPLITEAL BYPASS GRAFT Right 11/28/2015   Procedure: RIGHT  FEMORAL-POPLITEAL ARTERY BYPASS GRAFT AND CONSTRUCTION OF MILLER CUFF USING 6 MM X 80 CM PROPATEN  GRAFT ;  Surgeon: Conrad [place], MD;  Location: [place];  Service: Vascular;  Laterality: Right;  . INGUINAL HERNIA REPAIR Bilateral 2000  . PERIPHERAL VASCULAR  CATHETERIZATION N/A 11/26/2015   Procedure: Abdominal Aortogram;  Surgeon: [name], MD;  Location: [facility] CV LAB;  Service: Cardiovascular;  Laterality: N/A;  . VASECTOMY      There were no vitals filed for this visit.      Subjective Assessment - 06/10/17 0934    Subjective No new complaints. No new falls or pain to report today.    Pertinent History right TFA 12/26/2015, DM2, HTN, PVD/PAD   Limitations Lifting;Standing;Walking;House hold activities   Patient Stated Goals Use new microprocessor prosthesis walking on uneven terrain including golf.    Currently in Pain? No/denies            [facility] Adult PT Treatment/Exercise - 06/10/17 0935      Transfers   Transfers Sit to Stand;Stand to Sit   Sit to Stand 6: Modified independent (Device/Increase time);With upper extremity assist;With armrests;From chair/3-in-1   Stand to Sit 6: Modified independent (Device/Increase time);With upper extremity assist;With armrests;To chair/3-in-1     Ambulation/Gait   Ambulation/Gait Yes   Ambulation/Gait Assistance 5: Supervision   Ambulation/Gait Assistance Details used of yellow theraband in figure 8 around thights to decrease abdcution/circumduction with prosthesis advancement (yellow used vs red due to pt stated the red band was "too much" last time and made him sore). pt continued to need verbal cues for prosthetic knee flexion with swing phase and to decrease hip hiking with gait.  Ambulation Distance (Feet) 650 Feet  x1   Assistive device Prosthesis   Gait Pattern Step-through pattern;Decreased arm swing - right;Decreased step length - left;Decreased stance time - right;Decreased stride length;Decreased hip/knee flexion - right;Right circumduction;Right hip hike;Antalgic;Trunk rotated posteriorly on right;Abducted- right   Ambulation Surface Level;Indoor   Stairs Yes   Stairs Assistance 5: Supervision   Stairs Assistance Details (indicate cue  type and reason) cues on weight shifting and use of hydraulic knee with descending stairs. progressed from 2 rails, to right rail hold with light UE support on left side, to left rail hold with light UE support on right side. pt was too unsure to try just one rail hold, needed the light touch of the other side for security so to trust and shift weight onto prostheisis.    Stair Management Technique Two rails;Alternating pattern;Step to pattern;Forwards   Number of Stairs 4  multiple reps   Ramp 5: Supervision;4: Min assist   Ramp Details (indicate cue type and reason) supervision just ramp: with cues for weight shifting and to engage prosthetic knee. pt tends to "stiff leg" it up the ramp and then minimal flexion with down ramp. min HHA assist with blue mat over ramp to simulate compliant hills: continued to need cues on step length, weight shifting and to engage knee with negotiation   Gait Comments gait along ~50 foot hallway: forward gait with head movements left<>right and up<>down x 4 laps each with supervision. cues to keep base of support narrowed and to correct gait deviaitations stated above while engaging knee     Therapeutic Activites    Therapeutic Activities Lifting   Lifting blocked practice with lifting 10# crate to/from floor with min assist and cues to engage hydrualics; progressed to minisquats with chair behind pt and PTA in front of pt with emphasis on equal weight/midline to engage hydraulics with movements x 10 reps             PT Short Term Goals - 05/21/17 1225      PT SHORT TERM GOAL #1   Title Patient will demonstrate an increase in gait velocity between comfortable pace adn fast pace gait velocity by >/= 0.61f/s to indicate improvement in functional mobility. (TARGET DATE: 05/22/2017)    Baseline MET 05/21/2017  Gait Velocity comfortable 2.838fsec to fast 3.6641fec for 0.78 ft/sec increase   Time 4   Period Weeks   Status Achieved     PT SHORT TERM GOAL #2    Title Patient will demonstrate ability to retrieve an object from the floor by properly activating prosthetic knee with modified independence to indicate a decrease in his risk of falling. (TARGET DATE: 05/22/2017)    Baseline MET 05/21/2017   Time 4   Period Weeks   Status Achieved           PT Long Term Goals - 06/04/17 2108      PT LONG TERM GOAL #2   Title Functional Gait Assessment > 19/ 30 to indicate lower fall risk with gait.  (NEW TARGET DATE: 06/19/2017)    Baseline On-going 04/22/2017  FGA = 17/30   Time 8   Period Weeks   Status On-going     PT LONG TERM GOAL #3   Title Patient negotiates ramps & curbs with prosthetic knee control independently. (Target Date: 06/19/2017)   Status On-going     PT LONG TERM GOAL #4   Title Patient ambulates with minimal hip hiking and circumduction. (Target Date: 06/19/2017)  Status On-going     PT LONG TERM GOAL #5   Title Patient able to increase gait velocity >0.59f/sec comfortable to fast pace.  (NEW TARGET DATE: 06/19/2017)    Baseline 04/22/2017 increase in gait velocity = 0.351fs    Time 8   Period Weeks   Status On-going     PT LONG TERM GOAL #6   Title Patient will demonstrate ability to ambulate on stairs with reciprocal gait with 1 rail and modified independence to indicate a decrease in risk of falling. (TARGET DATE: 06/19/2017)   Time 8   Period Weeks   Status On-going               Plan - 06/10/17 0935    Clinical Impression Statement Today's skilled session continued to focus on pt's engagement of hydrualic knee with gait, barriers and activities while minimizing compensatory strategies. Pt continues to need cues to decreased abduction/circumduction and hip hiking with gait, especially with turns and multitasking. Pt needed increased support with inclines/declines when a compliant surfaces was introduced today. Pt is progressing toward goals and should benefit from continued PT to progress toward unmet goals.                           Rehab Potential Good   PT Frequency 1x / week   PT Duration 8 weeks   PT Treatment/Interventions ADLs/Self Care Home Management;Gait training;Stair training;Functional mobility training;Therapeutic activities;Therapeutic exercise;Balance training;Neuromuscular re-education;Patient/family education;Prosthetic Training   PT Next Visit Plan check LTGs due to plan of care ends 06/19/17.   Consulted and Agree with Plan of Care Patient      Patient will benefit from skilled therapeutic intervention in order to improve the following deficits and impairments:  Abnormal gait, Decreased balance, Decreased endurance, Postural dysfunction, Prosthetic Dependency, Decreased mobility  Visit Diagnosis: Other abnormalities of gait and mobility  Unsteadiness on feet     Problem List Patient Active Problem List   Diagnosis Date Noted  . Glucose intolerance 01/18/2016  . Phantom pain after [place] of lower extremity ([place]03/01/2016  . S/P AKA (above knee amputation), right ([facility] [place]/09/2016  . PAD (peripheral artery disease) ([place]01/28/2017  . Hyperlipidemia 12/07/2015  . Hypertension 12/04/2014    KaWillow OraPTA, [id number] Devonshire LaneSuKeithrRichlandNC [id number]/25/18, 6:36 PM   Name: StDEMORRIS CHOYCERN: [id number] of Birth: [birth year]

## 2017-06-17 ENCOUNTER — Encounter: Payer: Self-pay | Admitting: Physical Therapy

## 2017-06-17 ENCOUNTER — Ambulatory Visit: Payer: Medicare Other | Attending: Family Medicine | Admitting: Physical Therapy

## 2017-06-17 DIAGNOSIS — R29898 Other symptoms and signs involving the musculoskeletal system: Secondary | ICD-10-CM | POA: Insufficient documentation

## 2017-06-17 DIAGNOSIS — R2689 Other abnormalities of gait and mobility: Secondary | ICD-10-CM | POA: Insufficient documentation

## 2017-06-17 DIAGNOSIS — R2681 Unsteadiness on feet: Secondary | ICD-10-CM | POA: Diagnosis not present

## 2017-06-18 NOTE — Therapy (Signed)
[facility] [address] [place], [place], [address] Phone: [phone]   Fax:  [phone]  Physical Therapy Treatment  Patient Details  Name: Adam [name] MRN: [id number] Date of Birth: Aug 01, 1951 Referring Provider: Jill Alexanders, MD  Encounter Date: 06/17/2017      PT End of Session - 06/17/17 1640    Visit Number 16  G code done at visit 9   Number of Visits 24   Date for PT Re-Evaluation 08/14/17   Authorization Type Medicare G-code & progress note every 10th visit   PT Start Time 0930   PT Stop Time 1015   PT Time Calculation (min) 45 min   Equipment Utilized During Treatment Gait belt   Activity Tolerance Patient tolerated treatment well   Behavior During Therapy [facility] for tasks assessed/performed      Past Medical History:  Diagnosis Date  . Gangrene ([place]) 11/22/2015   right great toe  . Hypertension   . OSA on CPAP   . PAD (peripheral artery disease) ([place])   . Peripheral vascular disease ([place])   . Type II diabetes mellitus ([place]) dx'd 11/2015    Past Surgical History:  Procedure Laterality Date  . AMPUTATION Right 12/01/2015   Procedure: AMPUTATION RIGHT GREAT;  Surgeon: Conrad Edison, MD;  Location: [facility];  Service: Vascular;  Laterality: Right;  . AMPUTATION Right 12/16/2015   Procedure: AMPUTATION BELOW KNEE;  Surgeon: [name], MD;  Location: [facility];  Service: Vascular;  Laterality: Right;  . AMPUTATION Right 12/26/2015   Procedure: RIGHT ABOVE KNEE AMPUTATION;  Surgeon: [name], MD;  Location: [place];  Service: Vascular;  Laterality: Right;  . COLONOSCOPY    . FEMORAL-POPLITEAL BYPASS GRAFT Right 11/28/2015   Procedure: RIGHT  FEMORAL-POPLITEAL ARTERY BYPASS GRAFT AND CONSTRUCTION OF MILLER CUFF USING 6 MM X 80 CM PROPATEN  GRAFT ;  Surgeon: Conrad , MD;  Location: [facility];  Service: Vascular;  Laterality: Right;  . INGUINAL HERNIA REPAIR Bilateral 2000  . PERIPHERAL VASCULAR  CATHETERIZATION N/A 11/26/2015   Procedure: Abdominal Aortogram;  Surgeon: [name], MD;  Location: [facility];  Service: Cardiovascular;  Laterality: N/A;  . VASECTOMY      There were no vitals filed for this visit.      Subjective Assessment - 06/17/17 0935    Subjective He went to beach but did not get into water. He would like to continue PT as he feels he is improving but not up to level he wants & can achieve.    Pertinent History right TFA 12/26/2015, DM2, HTN, PVD/PAD   Limitations Lifting;Standing;Walking;House hold activities   Patient Stated Goals Use new microprocessor prosthesis walking on uneven terrain including golf.    Currently in Pain? No/denies            [facility] PT Assessment - 06/17/17 0930      Ambulation/Gait   Ambulation/Gait Yes   Ambulation/Gait Assistance 5: Supervision   Ambulation/Gait Assistance Details Pt able to minimally hip hike & circumduct on straight path & when fully focused on his gait. He requires tactile cues at pelvis in turns and direction changes.    Ambulation Distance (Feet) 700 Feet   Assistive device Prosthesis;None   Gait Pattern Step-through pattern;Decreased arm swing - right;Decreased stance time - right;Decreased hip/knee flexion - right;Right hip hike;Right circumduction;Abducted- right   Ambulation Surface Indoor;Level   Gait velocity 2.71 ft/sec comfortable, 3.00 ft/sec fast pace.  Stairs Yes   Stairs Assistance 5: Supervision   Stairs Assistance Details (indicate cue type and reason) descends using hydraulics: 2 rails with reciprocal pattern, with 1 rail needs step-to pattern or touch on wall or second rail for reciprocal   Stair Management Technique Two rails;One rail Left;Alternating pattern;Step to pattern;Forwards   Number of Stairs 4  >10 reps   Ramp 5: Supervision   Ramp Details (indicate cue type and reason) Minimal knee flexion in swing resulting in hip hiking & circumduction.  PT provided tactile  cues at pelvis & verbal cues which improved. Also worked on treadmill with BUE support with improvement.    Curb 6: Modified independent (Device/increase time)  prosthesis only     Functional Gait  Assessment   Gait assessed  Yes   Gait Level Surface Walks 20 ft, slow speed, abnormal gait pattern, evidence for imbalance or deviates 10-15 in outside of the 12 in walkway width. Requires more than 7 sec to ambulate 20 ft.   Change in Gait Speed Makes only minor adjustments to walking speed, or accomplishes a change in speed with significant gait deviations, deviates 10-15 in outside the 12 in walkway width, or changes speed but loses balance but is able to recover and continue walking.   Gait with Horizontal Head Turns Performs head turns smoothly with slight change in gait velocity (eg, minor disruption to smooth gait path), deviates 6-10 in outside 12 in walkway width, or uses an assistive device.   Gait with Vertical Head Turns Performs task with slight change in gait velocity (eg, minor disruption to smooth gait path), deviates 6 - 10 in outside 12 in walkway width or uses assistive device   Gait and Pivot Turn Pivot turns safely within 3 sec and stops quickly with no loss of balance.   Step Over Obstacle Is able to step over one shoe box (4.5 in total height) without changing gait speed. No evidence of imbalance.   Gait with Narrow Base of Support Ambulates 4-7 steps.   Gait with Eyes Closed Walks 20 ft, uses assistive device, slower speed, mild gait deviations, deviates 6-10 in outside 12 in walkway width. Ambulates 20 ft in less than 9 sec but greater than 7 sec.   Ambulating Backwards Walks 20 ft, uses assistive device, slower speed, mild gait deviations, deviates 6-10 in outside 12 in walkway width.   Steps Two feet to a stair, must use rail.   Total Score 17                     [facility] Adult PT Treatment/Exercise - 06/17/17 0930      Ambulation/Gait   Gait Comments Treadmill:  level ground increase to 2.4 mph; inclines 5%-10% at 1.6 mph uphill & downhill with manual /tactile cues at inclines >7% for prosthesis control                   PT Short Term Goals - 05/21/17 1225      PT SHORT TERM GOAL #1   Title Patient will demonstrate an increase in gait velocity between comfortable pace adn fast pace gait velocity by >/= 0.78f/s to indicate improvement in functional mobility. (TARGET DATE: 05/22/2017)    Baseline MET 05/21/2017  Gait Velocity comfortable 2.839fsec to fast 3.6658fec for 0.78 ft/sec increase   Time 4   Period Weeks   Status Achieved     PT SHORT TERM GOAL #2   Title Patient will demonstrate ability to retrieve  an object from the floor by properly activating prosthetic knee with modified independence to indicate a decrease in his risk of falling. (TARGET DATE: 05/22/2017)    Baseline MET 05/21/2017   Time 4   Period Weeks   Status Achieved           PT Long Term Goals - 06/17/17 1641      PT LONG TERM GOAL #2   Title Functional Gait Assessment > 19/ 30 to indicate lower fall risk with gait.    Baseline 06/17/2017  FGA = 17/30   Time 8   Period Weeks   Status On-going   Target Date 08/14/17     PT LONG TERM GOAL #3   Title Patient negotiates ramps with prosthetic knee control independently.    Baseline Partially MET  06/17/2017  Pt neg curb with prosthetic knee control, but minimal to no knee flexion with ramps or inclines. PT revised goal for ramp only.    Status On-going   Target Date 08/14/17     PT LONG TERM GOAL #4   Title Patient ambulates with minimal hip hiking and circumduction.    Baseline 06/17/2017 Progressing Pt can ambulate with minimal hip hiking & circumduction on straight path & only when focusing fully on his gait.    Status On-going   Target Date 08/14/17     PT LONG TERM GOAL #5   Title Patient able to increase gait velocity >0.72f/sec comfortable to fast pace.     Baseline 06/17/2017 increase in gait velocity = 0.3  ft/s    Time 8   Period Weeks   Status On-going   Target Date 08/14/17     PT LONG TERM GOAL #6   Title Patient will demonstrate ability to ambulate on stairs with reciprocal gait with 1 rail and modified independence to indicate a decrease in risk of falling.    Baseline 06/17/2017 Pt able to reciprocal descend stairs with 2 rails but not able with 1 rail.    Time 8   Period Weeks   Status On-going               Plan - 06/17/17 1704    Clinical Impression Statement Patient has improved his ability to engage his microprocessor knee with skilled instruction but has not met his potential level.  He reports significant decline in falls with microprocessor prosthesis actually catching him multiple times. Patient would benefit from further PT to improve his function at community level with his prosthesis.    Rehab Potential Good   PT Frequency 1x / week   PT Duration 8 weeks   PT Treatment/Interventions ADLs/Self Care Home Management;Gait training;Stair training;Functional mobility training;Therapeutic activities;Therapeutic exercise;Balance training;Neuromuscular re-education;Patient/family education;Prosthetic Training   PT Next Visit Plan prosthetic training with microprocessor working on increasing pace, knee control with turns, stairs & ramps.    Consulted and Agree with Plan of Care Patient      Patient will benefit from skilled therapeutic intervention in order to improve the following deficits and impairments:  Abnormal gait, Decreased balance, Decreased endurance, Postural dysfunction, Prosthetic Dependency, Decreased mobility  Visit Diagnosis: Other abnormalities of gait and mobility  Unsteadiness on feet  Other symptoms and signs involving the musculoskeletal system     Problem List Patient Active Problem List   Diagnosis Date Noted  . Glucose intolerance 01/18/2016  . Phantom pain after [place] of lower extremity ([place] 01/18/2016  . S/P AKA (above knee  amputation), right ([place] 12/29/2015  . PAD (  peripheral artery disease) ([place]) 12/15/2015  . Hyperlipidemia 12/07/2015  . Hypertension 12/04/2014    Judy Pollman PT, DPT 06/18/2017, 7:08 AM  [facility] [address] [place], [place], [address] Phone: [phone]   Fax:  [phone]  Name: Adam [name] MRN: [id number] Date of Birth: 08-07-1951

## 2017-06-22 ENCOUNTER — Ambulatory Visit: Payer: Medicare Other | Admitting: Physical Therapy

## 2017-06-22 ENCOUNTER — Encounter: Payer: Self-pay | Admitting: Physical Therapy

## 2017-06-22 DIAGNOSIS — R2681 Unsteadiness on feet: Secondary | ICD-10-CM

## 2017-06-22 DIAGNOSIS — R29898 Other symptoms and signs involving the musculoskeletal system: Secondary | ICD-10-CM | POA: Diagnosis not present

## 2017-06-22 DIAGNOSIS — R2689 Other abnormalities of gait and mobility: Secondary | ICD-10-CM

## 2017-06-22 NOTE — Therapy (Signed)
[facility] [address] [place], [place], [address] Phone: [phone]   Fax:  [phone]  Physical Therapy Treatment  Patient Details  Name: Adam [name] MRN: [id number] Date of Birth: [birth year] Referring Provider: Jill Alexanders, MD  Encounter Date: 06/22/2017      PT End of Session - 06/22/17 0813    Visit Number 17  G code done at visit 9   Number of Visits 24   Date for PT Re-Evaluation 08/14/17   Authorization Type Medicare G-code & progress note every 10th visit   PT Start Time [phone]  pt worked in after arrived on wrong day, started late due to he had left and was called to return by front office.   PT Stop Time 0845   PT Time Calculation (min) 34 min   Equipment Utilized During Treatment Gait belt   Activity Tolerance Patient tolerated treatment well   Behavior During Therapy WFL for tasks assessed/performed      Past Medical History:  Diagnosis Date  . Gangrene ([place]) 11/22/2015   right great toe  . Hypertension   . OSA on CPAP   . PAD (peripheral artery disease) ([place])   . Peripheral vascular disease ([place])   . Type II diabetes mellitus ([place]) dx'd 11/2015    Past Surgical History:  Procedure Laterality Date  . AMPUTATION Right 12/01/2015   Procedure: AMPUTATION RIGHT GREAT;  Surgeon: Conrad Kingsburg, MD;  Location: [facility];  Service: Vascular;  Laterality: Right;  . AMPUTATION Right 12/16/2015   Procedure: AMPUTATION BELOW KNEE;  Surgeon: [name], MD;  Location: [place] [facility];  Service: Vascular;  Laterality: Right;  . AMPUTATION Right 12/26/2015   Procedure: RIGHT ABOVE KNEE AMPUTATION;  Surgeon: [name], MD;  Location: [facility];  Service: Vascular;  Laterality: Right;  . COLONOSCOPY    . FEMORAL-POPLITEAL BYPASS GRAFT Right 11/28/2015   Procedure: RIGHT  FEMORAL-POPLITEAL ARTERY BYPASS GRAFT AND CONSTRUCTION OF MILLER CUFF USING 6 MM X 80 CM PROPATEN  GRAFT ;  Surgeon: Conrad Piperton, MD;  Location: [facility];   Service: Vascular;  Laterality: Right;  . INGUINAL HERNIA REPAIR Bilateral 2000  . PERIPHERAL VASCULAR CATHETERIZATION N/A 11/26/2015   Procedure: Abdominal Aortogram;  Surgeon: [name], MD;  Location: [facility];  Service: Cardiovascular;  Laterality: N/A;  . VASECTOMY      There were no vitals filed for this visit.      Subjective Assessment - 06/22/17 0813    Subjective No new complaints. No falls or pain to report.    Pertinent History right TFA 12/26/2015, DM2, HTN, PVD/PAD   Limitations Lifting;Standing;Walking;House hold activities   Patient Stated Goals Use new microprocessor prosthesis walking on uneven terrain including golf.    Currently in Pain? No/denies           [facility] Adult PT Treatment/Exercise - 06/22/17 0814      Transfers   Transfers Sit to Stand;Stand to Sit   Sit to Stand 6: Modified independent (Device/Increase time);With upper extremity assist;With armrests;From chair/3-in-1   Stand to Sit 6: Modified independent (Device/Increase time);With upper extremity assist;With armrests;To chair/3-in-1     Ambulation/Gait   Ambulation/Gait Yes   Ambulation/Gait Assistance 5: Supervision   Ambulation/Gait Assistance Details cues with gait to decrease circumduction, hip hiking with prothesis advacnemetn.   Ambulation Distance (Feet) 350 Feet  x1, plus around gym/treadmill   Assistive device Prosthesis;None   Gait Pattern Step-through pattern;Decreased arm swing -  right;Decreased stance time - right;Decreased hip/knee flexion - right;Right hip hike;Right circumduction;Abducted- right   Ambulation Surface Level;Indoor   Stairs Yes   Stairs Assistance 5: Supervision   Stairs Assistance Details (indicate cue type and reason) blocked practice working on weight shifting and prosthesis engagement with gait   Stair Management Technique Two rails;One rail Left;Alternating pattern;Step to pattern;Forwards   Number of Stairs 4  x multiple reps   Ramp 5:  Supervision;Other (comment)  min HHA with descending   Ramp Details (indicate cue type and reason) blocked practice working on weight shifting and knee engagement with descending    Gait Comments treadmill x 7-8 minutes total working on increased speed with short bursts of 30 seconds at faster speed x 4 reps with bil UE support; then worked on going down declines (backwards with treadmill in reverse) x 5 minutes at 1.5 mph with 1-4% inclines with bil UE support.               PT Short Term Goals - 05/21/17 1225      PT SHORT TERM GOAL #1   Title Patient will demonstrate an increase in gait velocity between comfortable pace adn fast pace gait velocity by >/= 0.85f/s to indicate improvement in functional mobility. (TARGET DATE: 05/22/2017)    Baseline MET 05/21/2017  Gait Velocity comfortable 2.886fsec to fast 3.6661fec for 0.78 ft/sec increase   Time 4   Period Weeks   Status Achieved     PT SHORT TERM GOAL #2   Title Patient will demonstrate ability to retrieve an object from the floor by properly activating prosthetic knee with modified independence to indicate a decrease in his risk of falling. (TARGET DATE: 05/22/2017)    Baseline MET 05/21/2017   Time 4   Period Weeks   Status Achieved           PT Long Term Goals - 06/17/17 1641      PT LONG TERM GOAL #2   Title Functional Gait Assessment > 19/ 30 to indicate lower fall risk with gait.    Baseline 06/17/2017  FGA = 17/30   Time 8   Period Weeks   Status On-going   Target Date 08/14/17     PT LONG TERM GOAL #3   Title Patient negotiates ramps with prosthetic knee control independently.    Baseline Partially MET  06/17/2017  Pt neg curb with prosthetic knee control, but minimal to no knee flexion with ramps or inclines. PT revised goal for ramp only.    Status On-going   Target Date 08/14/17     PT LONG TERM GOAL #4   Title Patient ambulates with minimal hip hiking and circumduction.    Baseline 06/17/2017 Progressing Pt  can ambulate with minimal hip hiking & circumduction on straight path & only when focusing fully on his gait.    Status On-going   Target Date 08/14/17     PT LONG TERM GOAL #5   Title Patient able to increase gait velocity >0.8ft26fc comfortable to fast pace.     Baseline 06/17/2017 increase in gait velocity = 0.3 ft/s    Time 8   Period Weeks   Status On-going   Target Date 08/14/17     PT LONG TERM GOAL #6   Title Patient will demonstrate ability to ambulate on stairs with reciprocal gait with 1 rail and modified independence to indicate a decrease in risk of falling.    Baseline 06/17/2017 Pt able to reciprocal descend stairs  with 2 rails but not able with 1 rail.    Time 8   Period Weeks   Status On-going            Plan - 06/22/17 1820    Clinical Impression Statement Today's skilled session continued to focus on engagement of hydraulic prosthetic knee with gait on level and unlevel surfaces. Decreased hip hiking noted with faster paced gait on treadmill today vs with pt's self selected gait speed. Also noted pt tends to hip hike and/or circumduct when distracted or                                Rehab Potential Good   PT Frequency 1x / week   PT Duration 8 weeks   PT Treatment/Interventions ADLs/Self Care Home Management;Gait training;Stair training;Functional mobility training;Therapeutic activities;Therapeutic exercise;Balance training;Neuromuscular re-education;Patient/family education;Prosthetic Training   PT Next Visit Plan prosthetic training with microprocessor working on increasing pace, knee control with turns, stairs & ramps.    Consulted and Agree with Plan of Care Patient      Patient will benefit from skilled therapeutic intervention in order to improve the following deficits and impairments:  Abnormal gait, Decreased balance, Decreased endurance, Postural dysfunction, Prosthetic Dependency, Decreased mobility  Visit Diagnosis: Other abnormalities of gait and  mobility  Unsteadiness on feet     Problem List Patient Active Problem List   Diagnosis Date Noted  . Glucose intolerance 01/18/2016  . Phantom pain after amputation of lower extremity ([place]) 01/18/2016  . S/P AKA (above knee amputation), right ([place]) 12/29/2015  . PAD (peripheral artery disease) ([place]) 12/15/2015  . Hyperlipidemia 12/07/2015  . Hypertension 12/04/2014    Willow Ora, PTA, [facility] [address], [place], [place] [address] [phone] 06/22/17, 12:13 PM   Name: Adam [name] MRN: [id number] Date of Birth: 1951-10-21

## 2017-06-23 ENCOUNTER — Encounter: Payer: Medicare Other | Admitting: Physical Therapy

## 2017-07-02 ENCOUNTER — Encounter: Payer: Medicare Other | Admitting: Physical Therapy

## 2017-07-08 ENCOUNTER — Encounter: Payer: Self-pay | Admitting: Physical Therapy

## 2017-07-08 ENCOUNTER — Other Ambulatory Visit: Payer: Self-pay | Admitting: Family Medicine

## 2017-07-08 ENCOUNTER — Ambulatory Visit: Payer: Medicare Other | Admitting: Physical Therapy

## 2017-07-08 DIAGNOSIS — R2689 Other abnormalities of gait and mobility: Secondary | ICD-10-CM

## 2017-07-08 DIAGNOSIS — R2681 Unsteadiness on feet: Secondary | ICD-10-CM

## 2017-07-08 DIAGNOSIS — R29898 Other symptoms and signs involving the musculoskeletal system: Secondary | ICD-10-CM

## 2017-07-08 NOTE — Therapy (Signed)
[facility] [address] [place], [place], [address] Phone: [phone]   Fax:  [phone]  Physical Therapy Treatment  Patient Details  Name: Adam [name] MRN: [id number] Date of Birth: November 05, 1951 Referring Provider: Jill Alexanders, MD  Encounter Date: 07/08/2017      PT End of Session - 07/08/17 1417    Visit Number 18  G code done at visit 9   Number of Visits 24   Date for PT Re-Evaluation 08/14/17   Authorization Type Medicare G-code & progress note every 10th visit   PT Start Time 0805   PT Stop Time 0845   PT Time Calculation (min) 40 min   Equipment Utilized During Treatment Gait belt   Activity Tolerance Patient tolerated treatment well   Behavior During Therapy [facility] for tasks assessed/performed      Past Medical History:  Diagnosis Date  . Gangrene ([place]) 11/22/2015   right great toe  . Hypertension   . OSA on CPAP   . PAD (peripheral artery disease) ([place])   . Peripheral vascular disease ([place])   . Type II diabetes mellitus ([place]) dx'd 11/2015    Past Surgical History:  Procedure Laterality Date  . AMPUTATION Right 12/01/2015   Procedure: AMPUTATION RIGHT GREAT;  Surgeon: Conrad [place], MD;  Location: [facility];  Service: Vascular;  Laterality: Right;  . AMPUTATION Right 12/16/2015   Procedure: AMPUTATION BELOW KNEE;  Surgeon: [name], MD;  Location: [facility];  Service: Vascular;  Laterality: Right;  . AMPUTATION Right 12/26/2015   Procedure: RIGHT ABOVE KNEE AMPUTATION;  Surgeon: [name], MD;  Location: [facility];  Service: Vascular;  Laterality: Right;  . COLONOSCOPY    . FEMORAL-POPLITEAL BYPASS GRAFT Right 11/28/2015   Procedure: RIGHT  FEMORAL-POPLITEAL ARTERY BYPASS GRAFT AND CONSTRUCTION OF MILLER CUFF USING 6 MM X 80 CM PROPATEN  GRAFT ;  Surgeon: Conrad [place], MD;  Location: [facility];  Service: Vascular;  Laterality: Right;  . INGUINAL HERNIA REPAIR Bilateral 2000  . PERIPHERAL VASCULAR  CATHETERIZATION N/A 11/26/2015   Procedure: Abdominal Aortogram;  Surgeon: [name], MD;  Location: [facility];  Service: Cardiovascular;  Laterality: N/A;  . VASECTOMY      There were no vitals filed for this visit.      Subjective Assessment - 07/08/17 0804    Pertinent History right TFA 12/26/2015, DM2, HTN, PVD/PAD   Limitations Lifting;Standing;Walking;House hold activities   Patient Stated Goals Use new microprocessor prosthesis walking on uneven terrain including golf.    Currently in Pain? No/denies      Prosthetic Training with Rheo microprocessor Transfemoral Prosthesis Pt purchased a vacuum seal bag to enable shallow water wading with boating.  PT demo the difference in full leg small, medium & large size with recommendation for medium. PT demo donning over prosthesis with shoe removed and recommendation for water sandal or shoe over top of bag including removing air from bag and use of 1# ankle wt to minimize floating. Pt verbalized understanding. PT also gave contact of peer visitor who does water sports at beach. Gait using mirror and lines on floor for minimizing abduction and prosthetic knee flexion in terminal stance for swing.  Stairs with 2 rails reciprocal pattern with verbal & demo cues. Ramp with hand hold initially progressing to supervision with cues on knee flexion ascending & descending.  PT Short Term Goals - 05/21/17 1225      PT SHORT TERM GOAL #1   Title Patient will demonstrate an increase in gait velocity between comfortable pace adn fast pace gait velocity by >/= 0.65f/s to indicate improvement in functional mobility. (TARGET DATE: 05/22/2017)    Baseline MET 05/21/2017  Gait Velocity comfortable 2.88fsec to fast 3.6677fec for 0.78 ft/sec increase   Time 4   Period Weeks   Status Achieved     PT SHORT TERM GOAL #2   Title Patient will demonstrate ability to retrieve an object from  the floor by properly activating prosthetic knee with modified independence to indicate a decrease in his risk of falling. (TARGET DATE: 05/22/2017)    Baseline MET 05/21/2017   Time 4   Period Weeks   Status Achieved           PT Long Term Goals - 06/17/17 1641      PT LONG TERM GOAL #2   Title Functional Gait Assessment > 19/ 30 to indicate lower fall risk with gait.    Baseline 06/17/2017  FGA = 17/30   Time 8   Period Weeks   Status On-going   Target Date 08/14/17     PT LONG TERM GOAL #3   Title Patient negotiates ramps with prosthetic knee control independently.    Baseline Partially MET  06/17/2017  Pt neg curb with prosthetic knee control, but minimal to no knee flexion with ramps or inclines. PT revised goal for ramp only.    Status On-going   Target Date 08/14/17     PT LONG TERM GOAL #4   Title Patient ambulates with minimal hip hiking and circumduction.    Baseline 06/17/2017 Progressing Pt can ambulate with minimal hip hiking & circumduction on straight path & only when focusing fully on his gait.    Status On-going   Target Date 08/14/17     PT LONG TERM GOAL #5   Title Patient able to increase gait velocity >0.8ft55fc comfortable to fast pace.     Baseline 06/17/2017 increase in gait velocity = 0.3 ft/s    Time 8   Period Weeks   Status On-going   Target Date 08/14/17     PT LONG TERM GOAL #6   Title Patient will demonstrate ability to ambulate on stairs with reciprocal gait with 1 rail and modified independence to indicate a decrease in risk of falling.    Baseline 06/17/2017 Pt able to reciprocal descend stairs with 2 rails but not able with 1 rail.    Time 8   Period Weeks   Status On-going               Plan - 07/08/17 1418    Clinical Impression Statement Patient is able to ambulate with prosthetic knee flexion with verbal & visual cues. But once he is not focusing on prosthesis, he returns to hip hiking with minimal knee flexion.  He improved  descending stairs reciprocally with 2 rails.    Rehab Potential Good   PT Frequency 1x / week   PT Duration 8 weeks   PT Treatment/Interventions ADLs/Self Care Home Management;Gait training;Stair training;Functional mobility training;Therapeutic activities;Therapeutic exercise;Balance training;Neuromuscular re-education;Patient/family education;Prosthetic Training   PT Next Visit Plan Do FGA for G-code and progress note to MD. prosthetic training with microprocessor working on increasing pace, knee control with turns, stairs & ramps.    Consulted and Agree with Plan of Care Patient      Patient  will benefit from skilled therapeutic intervention in order to improve the following deficits and impairments:  Abnormal gait, Decreased balance, Decreased endurance, Postural dysfunction, Prosthetic Dependency, Decreased mobility  Visit Diagnosis: Other abnormalities of gait and mobility  Unsteadiness on feet  Other symptoms and signs involving the musculoskeletal system     Problem List Patient Active Problem List   Diagnosis Date Noted  . Glucose intolerance 01/18/2016  . Phantom pain after amputation of lower extremity ([place]) 01/18/2016  . S/P AKA (above knee amputation), right ([place]) 12/29/2015  . PAD (peripheral artery disease) ([place]) 12/15/2015  . Hyperlipidemia 12/07/2015  . Hypertension 12/04/2014    Jamey Reas PT, DPT 07/08/2017, 2:31 PM  [facility] [address] [place], [place], [address] Phone: [phone]   Fax:  [phone]  Name: NORVILLE DANI MRN: [id number] Date of Birth: 1951/05/15

## 2017-07-15 ENCOUNTER — Ambulatory Visit: Payer: Medicare Other | Admitting: Physical Therapy

## 2017-07-15 ENCOUNTER — Other Ambulatory Visit: Payer: Self-pay | Admitting: Family Medicine

## 2017-07-15 ENCOUNTER — Encounter: Payer: Self-pay | Admitting: Physical Therapy

## 2017-07-15 DIAGNOSIS — R29898 Other symptoms and signs involving the musculoskeletal system: Secondary | ICD-10-CM | POA: Diagnosis not present

## 2017-07-15 DIAGNOSIS — R2681 Unsteadiness on feet: Secondary | ICD-10-CM

## 2017-07-15 DIAGNOSIS — R2689 Other abnormalities of gait and mobility: Secondary | ICD-10-CM

## 2017-07-15 NOTE — Therapy (Signed)
[facility] [address] [place], [place], [address] Phone: [phone]   Fax:  [phone]  Physical Therapy Treatment  Patient Details  Name: Adam [name] MRN: [id number] Date of Birth: 16-Sep-1951 Referring Provider: Jill Alexanders, MD  Encounter Date: 07/15/2017      PT End of Session - 07/15/17 1914    Visit Number 19  G code done at visit 19   Number of Visits 24   Date for PT Re-Evaluation 08/14/17   Authorization Type Medicare G-code & progress note every 10th visit   PT Start Time 1015   PT Stop Time 1055   PT Time Calculation (min) 40 min   Equipment Utilized During Treatment Gait belt   Activity Tolerance Patient tolerated treatment well   Behavior During Therapy [facility] for tasks assessed/performed      Past Medical History:  Diagnosis Date  . Gangrene ([place]) 11/22/2015   right great toe  . Hypertension   . OSA on CPAP   . PAD (peripheral artery disease) ([place])   . Peripheral vascular disease ([place])   . Type II diabetes mellitus ([place]) dx'd 11/2015    Past Surgical History:  Procedure Laterality Date  . AMPUTATION Right 12/01/2015   Procedure: AMPUTATION RIGHT GREAT;  Surgeon: Conrad [place], MD;  Location: [facility];  Service: Vascular;  Laterality: Right;  . AMPUTATION Right 12/16/2015   Procedure: AMPUTATION BELOW KNEE;  Surgeon: [name], MD;  Location: [facility];  Service: Vascular;  Laterality: Right;  . AMPUTATION Right 12/26/2015   Procedure: RIGHT ABOVE KNEE AMPUTATION;  Surgeon: [name], MD;  Location: [facility];  Service: Vascular;  Laterality: Right;  . COLONOSCOPY    . FEMORAL-POPLITEAL BYPASS GRAFT Right 11/28/2015   Procedure: RIGHT  FEMORAL-POPLITEAL ARTERY BYPASS GRAFT AND CONSTRUCTION OF MILLER CUFF USING 6 MM X 80 CM PROPATEN  GRAFT ;  Surgeon: Conrad , MD;  Location: [facility];  Service: Vascular;  Laterality: Right;  . INGUINAL HERNIA REPAIR Bilateral 2000  . PERIPHERAL VASCULAR  CATHETERIZATION N/A 11/26/2015   Procedure: Abdominal Aortogram;  Surgeon: [name], MD;  Location: [facility];  Service: Cardiovascular;  Laterality: N/A;  . VASECTOMY      There were no vitals filed for this visit.      Subjective Assessment - 07/15/17 1016    Subjective No falls. He has been working on bending knee.    Pertinent History right TFA 12/26/2015, DM2, HTN, PVD/PAD   Limitations Lifting;Standing;Walking;House hold activities   Patient Stated Goals Use new microprocessor prosthesis walking on uneven terrain including golf.    Currently in Pain? No/denies            [facility] PT Assessment - 07/15/17 1015      Functional Gait  Assessment   Gait assessed  Yes   Gait Level Surface Walks 20 ft in less than 7 sec but greater than 5.5 sec, uses assistive device, slower speed, mild gait deviations, or deviates 6-10 in outside of the 12 in walkway width.   Change in Gait Speed Able to change speed, demonstrates mild gait deviations, deviates 6-10 in outside of the 12 in walkway width, or no gait deviations, unable to achieve a major change in velocity, or uses a change in velocity, or uses an assistive device.   Gait with Horizontal Head Turns Performs head turns smoothly with slight change in gait velocity (eg, minor disruption to smooth gait path), deviates 6-10  in outside 12 in walkway width, or uses an assistive device.   Gait with Vertical Head Turns Performs task with slight change in gait velocity (eg, minor disruption to smooth gait path), deviates 6 - 10 in outside 12 in walkway width or uses assistive device   Gait and Pivot Turn Pivot turns safely within 3 sec and stops quickly with no loss of balance.   Step Over Obstacle Is able to step over one shoe box (4.5 in total height) without changing gait speed. No evidence of imbalance.   Gait with Narrow Base of Support Ambulates 4-7 steps.   Gait with Eyes Closed Walks 20 ft, uses assistive device, slower  speed, mild gait deviations, deviates 6-10 in outside 12 in walkway width. Ambulates 20 ft in less than 9 sec but greater than 7 sec.   Ambulating Backwards Walks 20 ft, uses assistive device, slower speed, mild gait deviations, deviates 6-10 in outside 12 in walkway width.   Steps Alternating feet, must use rail.   Total Score 20                     [facility] Adult PT Treatment/Exercise - 07/15/17 1916      Ambulation/Gait   Ambulation/Gait Yes   Ambulation/Gait Assistance 5: Supervision;4: Min guard   Ambulation/Gait Assistance Details Tactile & verbal cues on prosthetic knee flexion in swing & minimizing excessive wt shift left for right prothesis clearance.  PT assessed prosthesis height with right / prosthesis ASIS / iliac creast 1/4" short.  Pt had heel cup off the shelf foot orthosis on LLE that we removed and reassessed with level pelvis.  PT continued gait training with prosthesis flexion & limiting wt shift left.    Ambulation Distance (Feet) 1200 Feet  800' outdoors & 600' indoors. required standing rest ~500   Assistive device Prosthesis;None   Gait Pattern Step-through pattern;Decreased arm swing - right;Decreased stance time - right;Decreased hip/knee flexion - right;Right hip hike;Right circumduction;Abducted- right   Ambulation Surface Indoor;Level;Outdoor;Paved;Grass;Other (comment)  grass slopes varying degree   Stairs Yes   Stairs Assistance 5: Supervision   Stairs Assistance Details (indicate cue type and reason) descend with microprocessor TFA prosthesis with verbal cues on technique   Stair Management Technique Two rails;One rail Left;Alternating pattern;Forwards  descend with alt. pattern with 2 rails & left rail/RUE wall   Number of Stairs 4  10 reps   Ramp 5: Supervision;Other (comment)  prosthesis only   Ramp Details (indicate cue type and reason) verbal cues on prosthetic knee control & prosthesis   Curb 5: Supervision  prosthesis only   Curb Details  (indicate cue type and reason) verbal cues on technique     Prosthetics   Current prosthetic wear tolerance (days/week)  daily   Current prosthetic wear tolerance (#hours/day)  all awake hours   Current prosthetic weight-bearing tolerance (hours/day)  pt has LLE intermittent claudication pain with outdoor gait on grass.                   PT Short Term Goals - 05/21/17 1225      PT SHORT TERM GOAL #1   Title Patient will demonstrate an increase in gait velocity between comfortable pace adn fast pace gait velocity by >/= 0.31f/s to indicate improvement in functional mobility. (TARGET DATE: 05/22/2017)    Baseline MET 05/21/2017  Gait Velocity comfortable 2.824fsec to fast 3.6616fec for 0.78 ft/sec increase   Time 4   Period Weeks   Status  Achieved     PT SHORT TERM GOAL #2   Title Patient will demonstrate ability to retrieve an object from the floor by properly activating prosthetic knee with modified independence to indicate a decrease in his risk of falling. (TARGET DATE: 05/22/2017)    Baseline MET 05/21/2017   Time 4   Period Weeks   Status Achieved           PT Long Term Goals - 06/17/17 1641      PT LONG TERM GOAL #2   Title Functional Gait Assessment > 19/ 30 to indicate lower fall risk with gait.    Baseline 06/17/2017  FGA = 17/30   Time 8   Period Weeks   Status On-going   Target Date 08/14/17     PT LONG TERM GOAL #3   Title Patient negotiates ramps with prosthetic knee control independently.    Baseline Partially MET  06/17/2017  Pt neg curb with prosthetic knee control, but minimal to no knee flexion with ramps or inclines. PT revised goal for ramp only.    Status On-going   Target Date 08/14/17     PT LONG TERM GOAL #4   Title Patient ambulates with minimal hip hiking and circumduction.    Baseline 06/17/2017 Progressing Pt can ambulate with minimal hip hiking & circumduction on straight path & only when focusing fully on his gait.    Status On-going    Target Date 08/14/17     PT LONG TERM GOAL #5   Title Patient able to increase gait velocity >0.58f/sec comfortable to fast pace.     Baseline 06/17/2017 increase in gait velocity = 0.3 ft/s    Time 8   Period Weeks   Status On-going   Target Date 08/14/17     PT LONG TERM GOAL #6   Title Patient will demonstrate ability to ambulate on stairs with reciprocal gait with 1 rail and modified independence to indicate a decrease in risk of falling.    Baseline 06/17/2017 Pt able to reciprocal descend stairs with 2 rails but not able with 1 rail.    Time 8   Period Weeks   Status On-going               Plan - 0September 12, [id number]    Clinical Impression Statement Patient had improved prosthetic knee flexion in swing with tactile cues on limited excessive wt shift to left. Patient experienced intermittent claudication with ambulation on grass.    Rehab Potential Good   PT Frequency 1x / week   PT Duration 8 weeks   PT Treatment/Interventions ADLs/Self Care Home Management;Gait training;Stair training;Functional mobility training;Therapeutic activities;Therapeutic exercise;Balance training;Neuromuscular re-education;Patient/family education;Prosthetic Training   PT Next Visit Plan prosthetic training with microprocessor working on increasing pace, knee control with turns, stairs & ramps.    Consulted and Agree with Plan of Care Patient      Patient will benefit from skilled therapeutic intervention in order to improve the following deficits and impairments:  Abnormal gait, Decreased balance, Decreased endurance, Postural dysfunction, Prosthetic Dependency, Decreased mobility  Visit Diagnosis: Other abnormalities of gait and mobility  Unsteadiness on feet  Other symptoms and signs involving the musculoskeletal system       G-Codes - 009/12/20181928    Functional Assessment Tool Used (Outpatient Only) Functional Gait Assessment 20/30   Functional Limitation Mobility: Walking and moving  around   Mobility: Walking and Moving Around Current Status (([id number] At least 20 percent but less than 40  percent impaired, limited or restricted   Mobility: Walking and Moving Around Goal Status [phone]) At least 1 percent but less than 20 percent impaired, limited or restricted      Problem List Patient Active Problem List   Diagnosis Date Noted  . Glucose intolerance 01/18/2016  . Phantom pain after amputation of lower extremity ([place]) 01/18/2016  . S/P AKA (above knee amputation), right ([place]) 12/29/2015  . PAD (peripheral artery disease) ([place]) 12/15/2015  . Hyperlipidemia 12/07/2015  . Hypertension 12/04/2014   Physical Therapy Progress Note  Dates of Reporting Period: 04/30/2017 to 07/14/2017  Objective Reports of Subjective Statement: Patient reports improved mobility including ramps / inclines and stairs.   Objective Measurements: see above  Goal Update: see above  Plan: continue established plan  Reason Skilled Services are Required: Patient requires skilled care to progress mobility & safety with Transfemoral Microprocessor prosthesis.    Jamey Reas PT, DPT 07/15/2017, 7:28 PM  [facility] [address] [place], [place], [address] Phone: [phone]   Fax:  [phone]  Name: Adam [name] MRN: [id number] Date of Birth: 01/08/1951

## 2017-07-15 NOTE — Telephone Encounter (Signed)
Is this okay to refill? 

## 2017-07-22 ENCOUNTER — Encounter: Payer: Self-pay | Admitting: Physical Therapy

## 2017-07-22 ENCOUNTER — Ambulatory Visit: Payer: Medicare Other | Attending: Family Medicine | Admitting: Physical Therapy

## 2017-07-22 DIAGNOSIS — R2689 Other abnormalities of gait and mobility: Secondary | ICD-10-CM

## 2017-07-22 DIAGNOSIS — R2681 Unsteadiness on feet: Secondary | ICD-10-CM | POA: Diagnosis not present

## 2017-07-22 DIAGNOSIS — R29898 Other symptoms and signs involving the musculoskeletal system: Secondary | ICD-10-CM | POA: Diagnosis not present

## 2017-07-22 NOTE — Therapy (Signed)
[facility] [address] [place], [place], [address] Phone: [phone]   Fax:  [phone]  Physical Therapy Treatment  Patient Details  Name: Adam [name] MRN: [id number] Date of Birth: Oct 21, 1951 Referring Provider: Jill Alexanders, MD  Encounter Date: 07/22/2017      PT End of Session - 07/22/17 1203    Visit Number 20  G code done at visit 19   Number of Visits 24   Date for PT Re-Evaluation 08/14/17   Authorization Type Medicare G-code & progress note every 10th visit   PT Start Time 0856   PT Stop Time 0934   PT Time Calculation (min) 38 min   Equipment Utilized During Treatment Gait belt   Activity Tolerance Patient tolerated treatment well   Behavior During Therapy [facility] for tasks assessed/performed      Past Medical History:  Diagnosis Date  . Gangrene ([place]) 11/22/2015   right great toe  . Hypertension   . OSA on CPAP   . PAD (peripheral artery disease) ([place])   . Peripheral vascular disease ([place])   . Type II diabetes mellitus ([place]) dx'd 11/2015    Past Surgical History:  Procedure Laterality Date  . AMPUTATION Right 12/01/2015   Procedure: AMPUTATION RIGHT GREAT;  Surgeon: Conrad Buhl, MD;  Location: [facility];  Service: Vascular;  Laterality: Right;  . AMPUTATION Right 12/16/2015   Procedure: AMPUTATION BELOW KNEE;  Surgeon: [name], MD;  Location: [facility];  Service: Vascular;  Laterality: Right;  . AMPUTATION Right 12/26/2015   Procedure: RIGHT ABOVE KNEE AMPUTATION;  Surgeon: [name], MD;  Location: [facility];  Service: Vascular;  Laterality: Right;  . COLONOSCOPY    . FEMORAL-POPLITEAL BYPASS GRAFT Right 11/28/2015   Procedure: RIGHT  FEMORAL-POPLITEAL ARTERY BYPASS GRAFT AND CONSTRUCTION OF MILLER CUFF USING 6 MM X 80 CM PROPATEN  GRAFT ;  Surgeon: Conrad , MD;  Location: [facility];  Service: Vascular;  Laterality: Right;  . INGUINAL HERNIA REPAIR Bilateral 2000  . PERIPHERAL VASCULAR  CATHETERIZATION N/A 11/26/2015   Procedure: Abdominal Aortogram;  Surgeon: [name], MD;  Location: [facility];  Service: Cardiovascular;  Laterality: N/A;  . VASECTOMY      There were no vitals filed for this visit.      Subjective Assessment - 07/22/17 0858    Subjective No falls. He has been working on not hiking hip. His phone that tracks steps shows avg 3-4 miles /day.      Pertinent History right TFA 12/26/2015, DM2, HTN, PVD/PAD   Limitations Lifting;Standing;Walking;House hold activities   Patient Stated Goals Use new microprocessor prosthesis walking on uneven terrain including golf.    Currently in Pain? No/denies     Prosthetic Training with Rheo microprocessor prosthesis Pt ambulated 100' from lobby to gym with tactile cues to limit weight shift left with advancing right prosthesis. Pt had minimal RUE arm swing and holds UE in internal rotated position which minimizes trunk motion on right side.  PT initially tactile & verbal cues on arm swing with LLE movement but pt had difficulty coordinating.  PT in use of cane in RUE coordinating with LLE movement. Pt improved prosthetic knee engagement and counter trunk rotation. Progressed to scanning right/left, up/down & diagonals maintaining path & pace. Progressed to negotiating around obstacles with fluency.  Stairs with 2 rails alternating descending pattern with verbal cues. Progressed to rail & cane but had to do step to pattern  with verbal & tactile cues. Ramp with cane with limited prosthetic knee flexion. Moved to treadmill walking downhill 7* incline: with BUE support 1.17mh with visual, verbal & tactile cues. RUE support with LUE arm swing then switched to LUE support with RUE arm swing at 1.061m.                               PT Short Term Goals - 05/21/17 1225      PT SHORT TERM GOAL #1   Title Patient will demonstrate an increase in gait velocity between comfortable pace adn  fast pace gait velocity by >/= 0.66f766f to indicate improvement in functional mobility. (TARGET DATE: 05/22/2017)    Baseline MET 05/21/2017  Gait Velocity comfortable 2.23f79fc to fast 3.66ft80f for 0.78 ft/sec increase   Time 4   Period Weeks   Status Achieved     PT SHORT TERM GOAL #2   Title Patient will demonstrate ability to retrieve an object from the floor by properly activating prosthetic knee with modified independence to indicate a decrease in his risk of falling. (TARGET DATE: 05/22/2017)    Baseline MET 05/21/2017   Time 4   Period Weeks   Status Achieved           PT Long Term Goals - 06/17/17 1641      PT LONG TERM GOAL #2   Title Functional Gait Assessment > 19/ 30 to indicate lower fall risk with gait.    Baseline 06/17/2017  FGA = 17/30   Time 8   Period Weeks   Status On-going   Target Date 08/14/17     PT LONG TERM GOAL #3   Title Patient negotiates ramps with prosthetic knee control independently.    Baseline Partially MET  06/17/2017  Pt neg curb with prosthetic knee control, but minimal to no knee flexion with ramps or inclines. PT revised goal for ramp only.    Status On-going   Target Date 08/14/17     PT LONG TERM GOAL #4   Title Patient ambulates with minimal hip hiking and circumduction.    Baseline 06/17/2017 Progressing Pt can ambulate with minimal hip hiking & circumduction on straight path & only when focusing fully on his gait.    Status On-going   Target Date 08/14/17     PT LONG TERM GOAL #5   Title Patient able to increase gait velocity >0.8ft/s72fcomfortable to fast pace.     Baseline 06/17/2017 increase in gait velocity = 0.3 ft/s    Time 8   Period Weeks   Status On-going   Target Date 08/14/17     PT LONG TERM GOAL #6   Title Patient will demonstrate ability to ambulate on stairs with reciprocal gait with 1 rail and modified independence to indicate a decrease in risk of falling.    Baseline 06/17/2017 Pt able to reciprocal descend stairs with  2 rails but not able with 1 rail.    Time 8   Period Weeks   Status On-going               Plan - 07/22/17 1205    Clinical Impression Statement Use of cane in RUE and coordinating with LLE movements in gait improved prosthetic knee control, RUE arm swing and coordination of movements. Pt seems to understand PT recommendation to use cane over next week to develop gait habits.     Rehab Potential Good  PT Frequency 1x / week   PT Duration 8 weeks   PT Treatment/Interventions ADLs/Self Care Home Management;Gait training;Stair training;Functional mobility training;Therapeutic activities;Therapeutic exercise;Balance training;Neuromuscular re-education;Patient/family education;Prosthetic Training   PT Next Visit Plan prosthetic training with microprocessor working on increasing pace, knee control with turns, stairs & ramps.    Consulted and Agree with Plan of Care Patient      Patient will benefit from skilled therapeutic intervention in order to improve the following deficits and impairments:  Abnormal gait, Decreased balance, Decreased endurance, Postural dysfunction, Prosthetic Dependency, Decreased mobility  Visit Diagnosis: Other abnormalities of gait and mobility  Unsteadiness on feet  Other symptoms and signs involving the musculoskeletal system     Problem List Patient Active Problem List   Diagnosis Date Noted  . Glucose intolerance 01/18/2016  . Phantom pain after amputation of lower extremity ([place]) 01/18/2016  . S/P AKA (above knee amputation), right ([place]) 12/29/2015  . PAD (peripheral artery disease) ([place]) 12/15/2015  . Hyperlipidemia 12/07/2015  . Hypertension 12/04/2014    Sha Burling PT, DPT 07/22/2017, 12:08 PM  [facility] [address] [place], [place], [address] Phone: [phone]   Fax:  [phone]  Name: JANUEL DOOLAN MRN: [id number] Date of Birth: May 18, 1951

## 2017-07-29 ENCOUNTER — Encounter: Payer: Self-pay | Admitting: Physical Therapy

## 2017-07-29 ENCOUNTER — Ambulatory Visit: Payer: Medicare Other | Admitting: Physical Therapy

## 2017-07-29 DIAGNOSIS — R2681 Unsteadiness on feet: Secondary | ICD-10-CM | POA: Diagnosis not present

## 2017-07-29 DIAGNOSIS — R29898 Other symptoms and signs involving the musculoskeletal system: Secondary | ICD-10-CM

## 2017-07-29 DIAGNOSIS — R2689 Other abnormalities of gait and mobility: Secondary | ICD-10-CM
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
## 2017-08-05 ENCOUNTER — Ambulatory Visit: Payer: Medicare Other | Admitting: Physical Therapy

## 2017-08-06 ENCOUNTER — Encounter: Payer: Self-pay | Admitting: Physical Therapy

## 2017-08-06 ENCOUNTER — Ambulatory Visit: Payer: Medicare Other | Admitting: Physical Therapy

## 2017-08-06 DIAGNOSIS — R2681 Unsteadiness on feet: Secondary | ICD-10-CM

## 2017-08-06 DIAGNOSIS — R29898 Other symptoms and signs involving the musculoskeletal system: Secondary | ICD-10-CM

## 2017-08-06 DIAGNOSIS — R2689 Other abnormalities of gait and mobility: Secondary | ICD-10-CM | POA: Diagnosis not present
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
## 2017-08-10 ENCOUNTER — Encounter: Payer: Self-pay | Admitting: Family Medicine

## 2017-08-10 ENCOUNTER — Ambulatory Visit (INDEPENDENT_AMBULATORY_CARE_PROVIDER_SITE_OTHER): Payer: Medicare Other | Admitting: Family Medicine

## 2017-08-10 ENCOUNTER — Other Ambulatory Visit: Payer: Self-pay | Admitting: Family Medicine

## 2017-08-10 VITALS — BP 160/90 | HR 80 | Ht 71.0 in | Wt 189.4 lb

## 2017-08-10 DIAGNOSIS — I10 Essential (primary) hypertension: Secondary | ICD-10-CM

## 2017-08-10 DIAGNOSIS — R609 Edema, unspecified: Secondary | ICD-10-CM | POA: Diagnosis not present

## 2017-08-10 DIAGNOSIS — Z23 Encounter for immunization: Secondary | ICD-10-CM | POA: Diagnosis not present

## 2017-08-10 DIAGNOSIS — E7439 Other disorders of intestinal carbohydrate absorption: Secondary | ICD-10-CM | POA: Diagnosis not present

## 2017-08-10 LAB — POCT GLYCOSYLATED HEMOGLOBIN (HGB A1C): Hemoglobin A1C: 5.4

## 2017-08-10 NOTE — Progress Notes (Signed)
   Subjective:    Patient ID: Adam [name], male    DOB: [birth year], 66 y.o.   MRN: [id number]  HPI    Review of Systems     Objective:   Physical Exam        Assessment & Plan:
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
## 2017-08-12 ENCOUNTER — Encounter: Payer: Self-pay | Admitting: Physical Therapy

## 2017-08-12 ENCOUNTER — Ambulatory Visit: Payer: Medicare Other | Admitting: Physical Therapy

## 2017-08-12 DIAGNOSIS — R2689 Other abnormalities of gait and mobility: Secondary | ICD-10-CM

## 2017-08-12 DIAGNOSIS — R2681 Unsteadiness on feet: Secondary | ICD-10-CM | POA: Diagnosis not present

## 2017-08-12 DIAGNOSIS — R29898 Other symptoms and signs involving the musculoskeletal system: Secondary | ICD-10-CM

## 2017-08-12 NOTE — Therapy (Signed)
[facility] [address] [place], [place], [address] Phone: [phone]   Fax:  [phone]  Physical Therapy Treatment  Patient Details  Name: Adam [name] MRN: [id number] Date of Birth: 01-22-1951 Referring Provider: Sharlot Gowda, MD  Encounter Date: 08/12/2017      PT End of Session - 08/12/17 2133    Visit Number 23  G code done at visit 19   Number of Visits 24   Date for PT Re-Evaluation 08/14/17   Authorization Type Medicare G-code & progress note every 10th visit   PT Start Time 1018   PT Stop Time 1056   PT Time Calculation (min) 38 min   Activity Tolerance Patient tolerated treatment well   Behavior During Therapy [facility] for tasks assessed/performed      Past Medical History:  Diagnosis Date  . Gangrene (HCC) 11/22/2015   right great toe  . Hypertension   . OSA on CPAP   . PAD (peripheral artery disease) (HCC)   . Peripheral vascular disease (HCC)   . Type II diabetes mellitus (HCC) dx'd 11/2015    Past Surgical History:  Procedure Laterality Date  . AMPUTATION Right 12/01/2015   Procedure: AMPUTATION RIGHT GREAT;  Surgeon: Fransisco Hertz, MD;  Location: [facility] OR;  Service: Vascular;  Laterality: Right;  . AMPUTATION Right 12/16/2015   Procedure: AMPUTATION BELOW KNEE;  Surgeon: Larina Earthly, MD;  Location: [facility] OR;  Service: Vascular;  Laterality: Right;  . AMPUTATION Right 12/26/2015   Procedure: RIGHT ABOVE KNEE AMPUTATION;  Surgeon: Larina Earthly, MD;  Location: [facility] OR;  Service: Vascular;  Laterality: Right;  . COLONOSCOPY    . FEMORAL-POPLITEAL BYPASS GRAFT Right 11/28/2015   Procedure: RIGHT  FEMORAL-POPLITEAL ARTERY BYPASS GRAFT AND CONSTRUCTION OF MILLER CUFF USING 6 MM X 80 CM PROPATEN  GRAFT ;  Surgeon: Fransisco Hertz, MD;  Location: [facility] OR;  Service: Vascular;  Laterality: Right;  . INGUINAL HERNIA REPAIR Bilateral 2000  . PERIPHERAL VASCULAR CATHETERIZATION N/A 11/26/2015   Procedure: Abdominal  Aortogram;  Surgeon: Chuck Hint, MD;  Location: [facility] INVASIVE CV LAB;  Service: Cardiovascular;  Laterality: N/A;  . VASECTOMY      There were no vitals filed for this visit.      Subjective Assessment - 08/12/17 1018    Subjective He has working on walking fast for short distances, not hip hiking.    Pertinent History right TFA 12/26/2015, DM2, HTN, PVD/PAD   Limitations Lifting;Standing;Walking;House hold activities   Patient Stated Goals Use new microprocessor prosthesis walking on uneven terrain including golf.    Currently in Pain? No/denies            [facility] PT Assessment - 08/12/17 1020      Observation/Other Assessments   Focus on Therapeutic Outcomes (FOTO)  57.37% Functional Status  Initial was 48.11% Functional Status   Activities of Balance Confidence Scale (ABC Scale)  82.5%   Initial was 70.6%     Ambulation/Gait   Ambulation/Gait Yes   Ambulation/Gait Assistance 6: Modified independent (Device/Increase time)   Ambulation Distance (Feet) 1500 Feet   Assistive device Prosthesis;None   Ambulation Surface Indoor;Level;Outdoor;Paved;Gravel;Grass;Unlevel   Gait velocity 3.19 ft/sec  comfortable & 4.11 ft/sec fast pace  previous 2.71 ft/sec comfortable & 3.00 ft/sec fast pace   Stairs Yes   Stairs Assistance 6: Modified independent (Device/Increase time);5: Supervision   Stairs Assistance Details (indicate cue type and reason) Descending: 2 rails alt pattern & 1 rail  step-to riding hydraulics until 3 steps from bottom then alternates   Stair Management Technique Two rails;One rail Left;Alternating pattern;Step to pattern;Forwards   Number of Stairs 4  10+ reps   Ramp 6: Modified independent (Device)  prosthesis only   Ramp Details (indicate cue type and reason) prosthetic knee flexion   Curb 6: Modified independent (Device/increase time)  prosthesis only     Functional Gait  Assessment   Gait assessed  Yes   Gait Level Surface Walks 20 ft in less than 7  sec but greater than 5.5 sec, uses assistive device, slower speed, mild gait deviations, or deviates 6-10 in outside of the 12 in walkway width.   Change in Gait Speed Able to smoothly change walking speed without loss of balance or gait deviation. Deviate no more than 6 in outside of the 12 in walkway width.   Gait with Horizontal Head Turns Performs head turns smoothly with no change in gait. Deviates no more than 6 in outside 12 in walkway width   Gait with Vertical Head Turns Performs head turns with no change in gait. Deviates no more than 6 in outside 12 in walkway width.   Gait and Pivot Turn Pivot turns safely within 3 sec and stops quickly with no loss of balance.   Step Over Obstacle Is able to step over one shoe box (4.5 in total height) without changing gait speed. No evidence of imbalance.   Gait with Narrow Base of Support Ambulates 4-7 steps.   Gait with Eyes Closed Walks 20 ft, uses assistive device, slower speed, mild gait deviations, deviates 6-10 in outside 12 in walkway width. Ambulates 20 ft in less than 9 sec but greater than 7 sec.   Ambulating Backwards Walks 20 ft, no assistive devices, good speed, no evidence for imbalance, normal gait   Steps Alternating feet, must use rail.   Total Score 24   FGA comment: Initial FGA 13/30                               PT Short Term Goals - 05/21/17 1225      PT SHORT TERM GOAL #1   Title Patient will demonstrate an increase in gait velocity between comfortable pace adn fast pace gait velocity by >/= 0.10f/s to indicate improvement in functional mobility. (TARGET DATE: 05/22/2017)    Baseline MET 05/21/2017  Gait Velocity comfortable 2.828fsec to fast 3.6676fec for 0.78 ft/sec increase   Time 4   Period Weeks   Status Achieved     PT SHORT TERM GOAL #2   Title Patient will demonstrate ability to retrieve an object from the floor by properly activating prosthetic knee with modified independence to indicate a  decrease in his risk of falling. (TARGET DATE: 05/22/2017)    Baseline MET 05/21/2017   Time 4   Period Weeks   Status Achieved           PT Long Term Goals - 08/12/17 2138      PT LONG TERM GOAL #2   Title Functional Gait Assessment > 19/ 30 to indicate lower fall risk with gait.    Baseline MET 08/12/2017 FGA 24/30   Time 8   Period Weeks   Status Achieved     PT LONG TERM GOAL #3   Title Patient negotiates ramps with prosthetic knee control independently.    Baseline MET 08/12/2017   Status Achieved  PT LONG TERM GOAL #4   Title Patient ambulates with minimal hip hiking and circumduction.    Baseline Partially MET 2017-08-19 Patient ambulates with minimal hip hiking & circumduction when focuses on his gait but deviations present when not focused.    Status Partially Met     PT LONG TERM GOAL #5   Title Patient able to increase gait velocity >0.2f/sec comfortable to fast pace.     Baseline MET 9Oct 03, 2018Patient able to increase 0.92 ft/sec (comfortable 3.19 ft/sec & fast 4.11 ft/sec)   Time 8   Period Weeks   Status Achieved     PT LONG TERM GOAL #6   Title Patient will demonstrate ability to ambulate on stairs with reciprocal gait with 1 rail and modified independence to indicate a decrease in risk of falling.    Baseline 9Oct 03, 2018partially met  Pt descends reciprocally flight with 2 rails. With single rail pt descends step-to "riding hydraulics" until 3 steps from bottom then reciprocal.    Time 8   Period Weeks   Status Partially Met               Plan - 010/03/182March 15, 2143   Clinical Impression Statement Patient met or partially met long term goals. Patient improved ability to utilize microprocessor prosthesis. He reports ability to engage in activities including golf & mowing his yard with push mower. He reports no falls in last 3 months.    Rehab Potential Good   PT Frequency 1x / week   PT Duration 8 weeks   PT Treatment/Interventions ADLs/Self Care Home  Management;Gait training;Stair training;Functional mobility training;Therapeutic activities;Therapeutic exercise;Balance training;Neuromuscular re-education;Patient/family education;Prosthetic Training   PT Next Visit Plan Discharge   Consulted and Agree with Plan of Care Patient      Patient will benefit from skilled therapeutic intervention in order to improve the following deficits and impairments:  Abnormal gait, Decreased balance, Decreased endurance, Postural dysfunction, Prosthetic Dependency, Decreased mobility  Visit Diagnosis: Other abnormalities of gait and mobility  Unsteadiness on feet  Other symptoms and signs involving the musculoskeletal system       G-Codes - [id number]   Functional Assessment Tool Used (Outpatient Only) Functional Gait Assessment 24/30   Functional Limitation Mobility: Walking and moving around   Mobility: Walking and Moving Around Goal Status [phone] At least 1 percent but less than 20 percent impaired, limited or restricted   Mobility: Walking and Moving Around Discharge Status [phone] At least 1 percent but less than 20 percent impaired, limited or restricted      Problem List Patient Active Problem List   Diagnosis Date Noted  . Glucose intolerance 01/18/2016  . Phantom pain after amputation of lower extremity ([place] 01/18/2016  . S/P AKA (above knee amputation), right ([place] 12/29/2015  . PAD (peripheral artery disease) ([place] 12/15/2015  . Hyperlipidemia 12/07/2015  . Hypertension 12/04/2014   PHYSICAL THERAPY DISCHARGE SUMMARY  Visits from Start of Care: 23  Current functional level related to goals / functional outcomes: See above   Remaining deficits: See above   Education / Equipment: Prosthetic use & HEP  Plan: Patient agrees to discharge.  Patient goals were partially met. Patient is being discharged due to meeting the stated rehab goals.  ?????         Oral Hallgren PT, DPT 903-Oct-2018 9:51 PM  [facility] [place] [address]Phone: [phone]  Fax:  [phone] Name: SOnesimo Lingard  [place] MRN: [id number] Date of Birth: 1951-10-17

## 2017-08-19 ENCOUNTER — Encounter: Payer: Medicare Other | Admitting: Physical Therapy

## 2017-09-10 ENCOUNTER — Other Ambulatory Visit: Payer: Self-pay | Admitting: Family Medicine

## 2017-09-27 IMAGING — CR DG CHEST 1V PORT
1 series · 1 of 1 positions shown · non-contrast
Comparison: None in PACs

CLINICAL DATA: Preoperative examination prior foot surgery, no
current chest complaints, former smoker.

EXAM:
PORTABLE CHEST 1 VIEW

[AP]
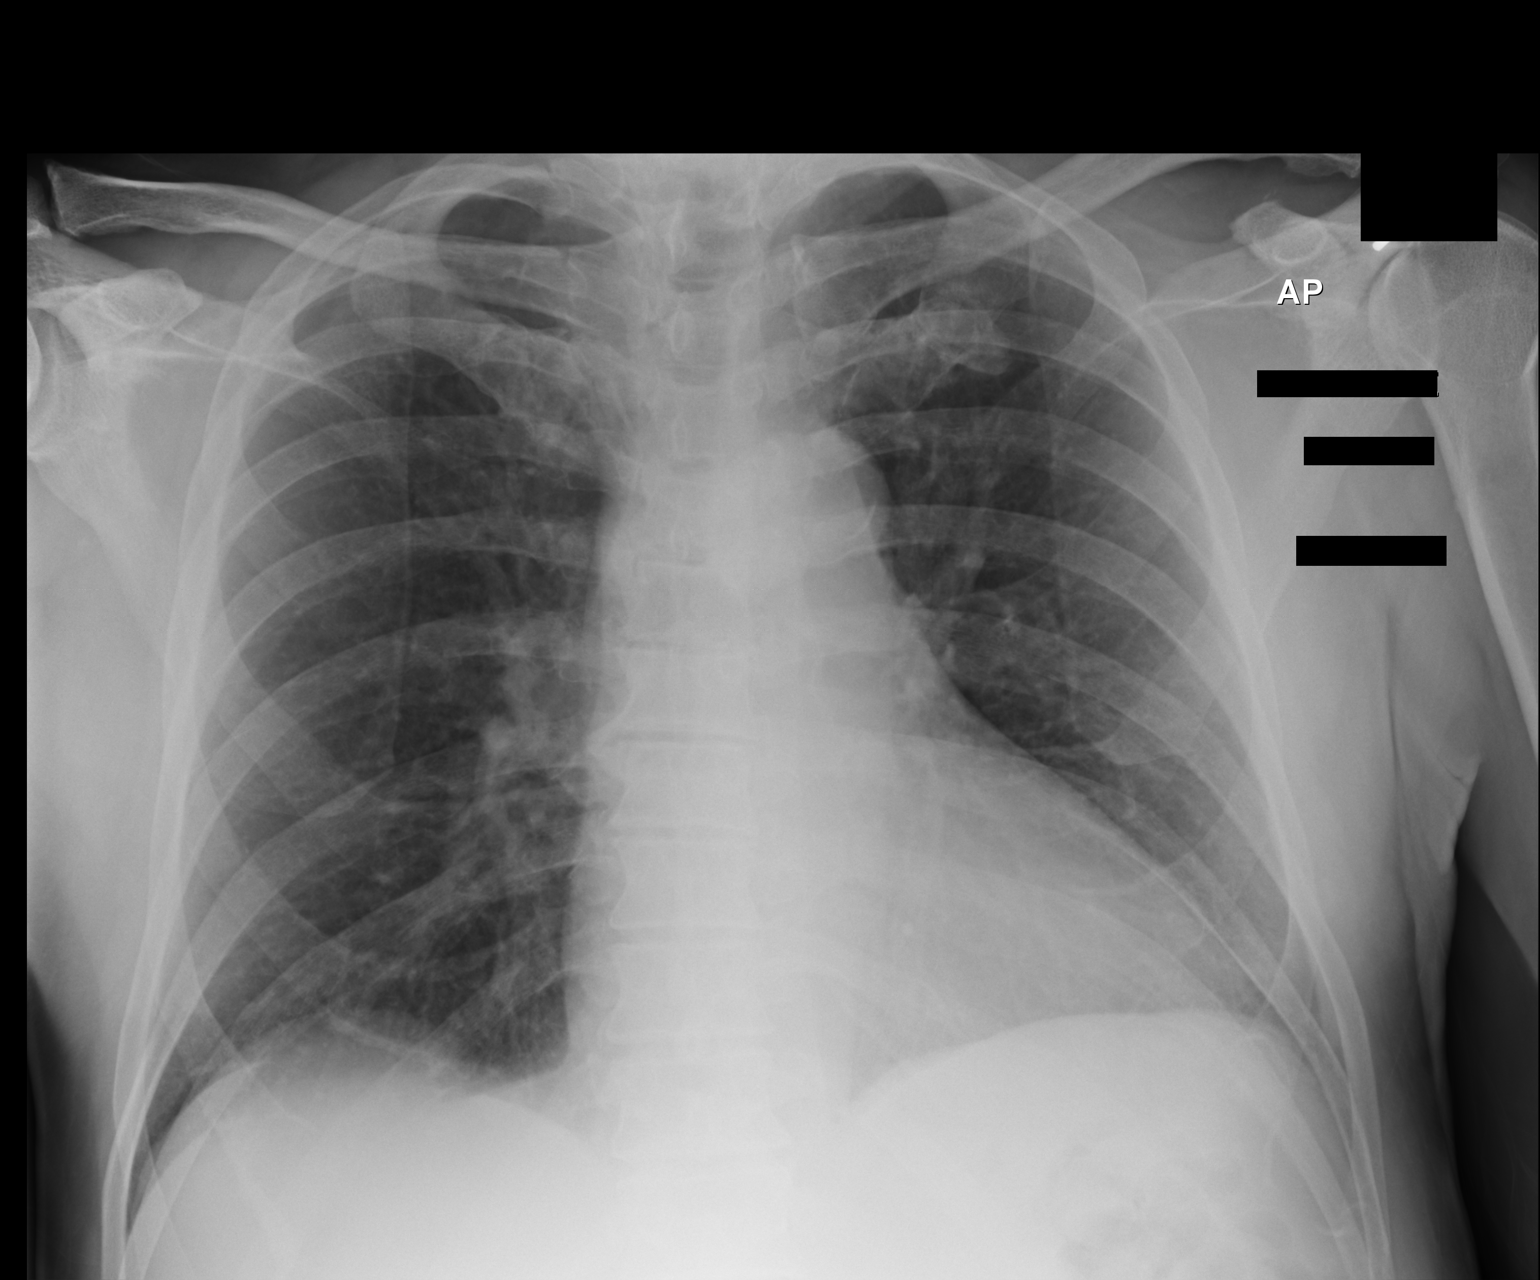

[1 of 1 positions shown; findings below may reference images not displayed]

FINDINGS: The lungs are adequately inflated and clear. The heart and pulmonary
vascularity are normal. The mediastinum is normal in width. The bony
thorax is unremarkable.
IMPRESSION: There is no active cardiopulmonary disease.

## 2017-10-28 ENCOUNTER — Other Ambulatory Visit: Payer: Self-pay | Admitting: Family Medicine

## 2017-10-28 DIAGNOSIS — D225 Melanocytic nevi of trunk: Secondary | ICD-10-CM | POA: Diagnosis not present

## 2017-10-28 DIAGNOSIS — L821 Other seborrheic keratosis: Secondary | ICD-10-CM | POA: Diagnosis not present

## 2017-10-28 DIAGNOSIS — L905 Scar conditions and fibrosis of skin: Secondary | ICD-10-CM | POA: Diagnosis not present

## 2017-12-29 ENCOUNTER — Encounter: Payer: Self-pay | Admitting: Cardiology

## 2018-01-30 ENCOUNTER — Other Ambulatory Visit: Payer: Self-pay | Admitting: Family Medicine

## 2018-02-01 NOTE — Telephone Encounter (Signed)
Done 02-18-18

## 2018-02-01 NOTE — Telephone Encounter (Signed)
Please advise if lexapro can be fill at [facility]. [facility]

## 2018-02-01 NOTE — Telephone Encounter (Signed)
He needs an office visit.  

## 2018-02-18 ENCOUNTER — Encounter: Payer: Self-pay | Admitting: Family Medicine

## 2018-02-18 ENCOUNTER — Other Ambulatory Visit: Payer: Self-pay

## 2018-02-18 ENCOUNTER — Ambulatory Visit (INDEPENDENT_AMBULATORY_CARE_PROVIDER_SITE_OTHER): Payer: PRIVATE HEALTH INSURANCE | Admitting: Family Medicine

## 2018-02-18 VITALS — BP 154/90 | HR 76 | Wt 194.6 lb

## 2018-02-18 DIAGNOSIS — G546 Phantom limb syndrome with pain: Secondary | ICD-10-CM

## 2018-02-18 DIAGNOSIS — I10 Essential (primary) hypertension: Secondary | ICD-10-CM | POA: Diagnosis not present

## 2018-02-18 DIAGNOSIS — I739 Peripheral vascular disease, unspecified: Secondary | ICD-10-CM

## 2018-02-18 DIAGNOSIS — E1169 Type 2 diabetes mellitus with other specified complication: Secondary | ICD-10-CM | POA: Diagnosis not present

## 2018-02-18 DIAGNOSIS — Z89611 Acquired absence of right leg above knee: Secondary | ICD-10-CM

## 2018-02-18 DIAGNOSIS — Z1159 Encounter for screening for other viral diseases: Secondary | ICD-10-CM

## 2018-02-18 DIAGNOSIS — E7439 Other disorders of intestinal carbohydrate absorption: Secondary | ICD-10-CM | POA: Diagnosis not present

## 2018-02-18 DIAGNOSIS — E785 Hyperlipidemia, unspecified: Secondary | ICD-10-CM | POA: Diagnosis not present

## 2018-02-18 DIAGNOSIS — Z23 Encounter for immunization: Secondary | ICD-10-CM | POA: Diagnosis not present

## 2018-02-18 MED ORDER — AMLODIPINE BESYLATE 10 MG PO TABS: 10.0000 mg | ORAL_TABLET | Freq: Every day | ORAL | 3 refills | Status: DC

## 2018-02-18 MED ORDER — ATORVASTATIN CALCIUM 10 MG PO TABS: 10.0000 mg | ORAL_TABLET | Freq: Every day | ORAL | 3 refills | Status: DC

## 2018-02-18 MED ORDER — LOSARTAN POTASSIUM-HCTZ 100-12.5 MG PO TABS: 1.0000 | ORAL_TABLET | Freq: Every day | ORAL | 3 refills | Status: DC

## 2018-02-18 NOTE — Patient Instructions (Signed)
Check your blood pressure readings in the resting position sitting with her arm at heart level and call me after you have had a couple weeks with the readings

## 2018-02-18 NOTE — Progress Notes (Signed)
   Subjective:    Patient ID: Adam [name], male    DOB: May 05, 1951, 67 y.o.   MRN: [id number]  HPI He is here for an interval evaluation.  He is doing quite nicely on his new prosthetic device.  He does occasionally have phantom symptoms but not necessarily pain.  He continues on Lipitor and is not having any difficulty with that.  He continues on amlodipine and losartan/HCTZ.  He does note that his blood pressure tend to be elevated in doctor's office.  He does have a machine at home but did not bring the readings in with him.  He is a former smoker.  He has had a good vascular workup in the past.  Continues on Lexapro and is not sure whether he needs it.  Health maintenance and immunizations were reviewed.  He has no other concerns or complaints.   Review of Systems     Objective:   Physical Exam Alert and in no distress. Tympanic membranes and canals are normal. Pharyngeal area is normal. Neck is supple without adenopathy or thyromegaly. Cardiac exam shows a regular sinus rhythm without murmurs or gallops. Lungs are clear to auscultation.  Abdominal exam shows no masses or tenderness. A1c is 5.6.      Assessment & Plan:  Essential hypertension - Plan: CBC with Differential/Platelet, Comprehensive metabolic panel  Glucose intolerance - Plan: CBC with Differential/Platelet, Comprehensive metabolic panel, Lipid panel  PAD (peripheral artery disease) (HCC) - Plan: CBC with Differential/Platelet, Comprehensive metabolic panel, Lipid panel  S/P AKA (above knee amputation), right (HCC)  Phantom pain after amputation of lower extremity (HCC)  Hyperlipidemia, unspecified hyperlipidemia type - Plan: Lipid panel  Need for vaccination for Strep pneumoniae - Plan: Pneumococcal polysaccharide vaccine 23-valent greater than or equal to 2yo subcutaneous/IM  Need for hepatitis C screening test - Plan: Hepatitis C antibody  Need for shingles vaccine - Plan: Varicella-zoster vaccine IM He is  to keep track of his blood pressure readings and I explained proper blood pressure taking technique.  He is to call me in 2 weeks to let me know his blood pressure readings.  He will continue on his other medications.  We also discussed stopping the Lexapro and he will go ahead and do this.  I explained that he might notice more mood swings, however hopefully this will not lead to him going downhill psychologically.  He will keep me informed.

## 2018-02-19 LAB — COMPREHENSIVE METABOLIC PANEL
ALT: 41 IU/L (ref 0–44)
AST: 26 IU/L (ref 0–40)
Albumin/Globulin Ratio: 2.2 (ref 1.2–2.2)
Albumin: 4.7 g/dL (ref 3.6–4.8)
Alkaline Phosphatase: 43 IU/L (ref 39–117)
BUN/Creatinine Ratio: 23 (ref 10–24)
BUN: 18 mg/dL (ref 8–27)
Bilirubin Total: 0.4 mg/dL (ref 0.0–1.2)
CO2: 24 mmol/L (ref 20–29)
Calcium: 9.5 mg/dL (ref 8.6–10.2)
Chloride: 105 mmol/L (ref 96–106)
Creatinine, Ser: 0.78 mg/dL (ref 0.76–1.27)
GFR calc Af Amer: 108 mL/min/{1.73_m2} (ref >59)
GFR calc non Af Amer: 93 mL/min/{1.73_m2} (ref >59)
Globulin, Total: 2.1 g/dL (ref 1.5–4.5)
Glucose: 123 mg/dL — ABNORMAL HIGH (ref 65–99)
Potassium: 3.6 mmol/L (ref 3.5–5.2)
Sodium: 146 mmol/L — ABNORMAL HIGH (ref 134–144)
Total Protein: 6.8 g/dL (ref 6.0–8.5)

## 2018-02-19 LAB — CBC WITH DIFFERENTIAL/PLATELET
BASOS: 0 %
Basophils Absolute: 0 10*3/uL (ref 0.0–0.2)
EOS (ABSOLUTE): 0.1 10*3/uL (ref 0.0–0.4)
Eos: 1 %
Hematocrit: 42.6 % (ref 37.5–51.0)
Hemoglobin: 14.5 g/dL (ref 13.0–17.7)
IMMATURE GRANS (ABS): 0 10*3/uL (ref 0.0–0.1)
IMMATURE GRANULOCYTES: 0 %
LYMPHS: 26 %
Lymphocytes Absolute: 1.8 10*3/uL (ref 0.7–3.1)
MCH: 31.7 pg (ref 26.6–33.0)
MCHC: 34 g/dL (ref 31.5–35.7)
MCV: 93 fL (ref 79–97)
MONOCYTES: 6 %
MONOS ABS: 0.4 10*3/uL (ref 0.1–0.9)
NEUTROS PCT: 67 %
Neutrophils Absolute: 4.5 10*3/uL (ref 1.4–7.0)
Platelets: 291 10*3/uL (ref 150–379)
RBC: 4.58 x10E6/uL (ref 4.14–5.80)
RDW: 13.8 % (ref 12.3–15.4)
WBC: 6.8 10*3/uL (ref 3.4–10.8)

## 2018-02-19 LAB — LIPID PANEL
Chol/HDL Ratio: 3.1 ratio (ref 0.0–5.0)
Cholesterol, Total: 137 mg/dL (ref 100–199)
HDL: 44 mg/dL (ref >39)
LDL Calculated: 49 mg/dL (ref 0–99)
Triglycerides: 220 mg/dL — ABNORMAL HIGH (ref 0–149)
VLDL Cholesterol Cal: 44 mg/dL — ABNORMAL HIGH (ref 5–40)

## 2018-02-19 LAB — HEPATITIS C ANTIBODY

## 2018-04-29 ENCOUNTER — Telehealth: Payer: Self-pay | Admitting: Family Medicine

## 2018-04-29 NOTE — Telephone Encounter (Signed)
Recv'd [facility] requesting refill Escitalopram 10mg 

## 2018-04-29 NOTE — Telephone Encounter (Signed)
Do not refill.  

## 2018-08-27 ENCOUNTER — Telehealth: Payer: Self-pay | Admitting: Family Medicine

## 2018-08-27 ENCOUNTER — Other Ambulatory Visit (INDEPENDENT_AMBULATORY_CARE_PROVIDER_SITE_OTHER): Payer: PRIVATE HEALTH INSURANCE

## 2018-08-27 DIAGNOSIS — Z23 Encounter for immunization: Secondary | ICD-10-CM

## 2018-08-27 NOTE — Telephone Encounter (Signed)
Pt requesting orders to go back to [facility] for retraining for his prosthetic leg. Pt said he went to [facility]'s rehab department for training a while back to help him with his leg and he feels that he need to go through a little retraining now.

## 2018-08-27 NOTE — Telephone Encounter (Signed)
ok 

## 2018-09-02 ENCOUNTER — Other Ambulatory Visit: Payer: Self-pay

## 2018-09-02 DIAGNOSIS — Z89611 Acquired absence of right leg above knee: Secondary | ICD-10-CM

## 2018-09-02 NOTE — Telephone Encounter (Signed)
Done KH 

## 2018-10-18 ENCOUNTER — Ambulatory Visit: Payer: No Typology Code available for payment source | Attending: Family Medicine | Admitting: Physical Therapy

## 2018-10-18 ENCOUNTER — Encounter: Payer: Self-pay | Admitting: Physical Therapy

## 2018-10-18 ENCOUNTER — Other Ambulatory Visit: Payer: Self-pay

## 2018-10-18 DIAGNOSIS — R29898 Other symptoms and signs involving the musculoskeletal system: Secondary | ICD-10-CM | POA: Diagnosis present

## 2018-10-18 DIAGNOSIS — R2689 Other abnormalities of gait and mobility: Secondary | ICD-10-CM | POA: Diagnosis not present

## 2018-10-18 DIAGNOSIS — R293 Abnormal posture: Secondary | ICD-10-CM | POA: Insufficient documentation

## 2018-10-18 DIAGNOSIS — R2681 Unsteadiness on feet: Secondary | ICD-10-CM | POA: Diagnosis present

## 2018-10-19 NOTE — Therapy (Signed)
[facility] [address] [place], [place], [address] Phone: [phone]   Fax:  [phone]  Physical Therapy Evaluation  Patient Details  Name: Adam [name] MRN: [id number] Date of Birth: February 23, 1951 Referring Provider (PT): Jill Alexanders, MD   Encounter Date: 10/18/2018  PT End of Session - 10/18/18 1906    Visit Number  1    Number of Visits  13    Date for PT Re-Evaluation  01/14/19    Authorization Type  [employer]    Authorization Time Period  [phone] deductible /oop; applied $587.36    VL: PT, OT, Sp, Chiro, pulmonary 30: zero used    PT Start Time  1530    PT Stop Time  1615    PT Time Calculation (min)  45 min    Equipment Utilized During Treatment  Gait belt    Activity Tolerance  Patient tolerated treatment well    Behavior During Therapy  [facility] for tasks assessed/performed       Past Medical History:  Diagnosis Date  . Gangrene ([place]) 11/22/2015   right great toe  . Hypertension   . OSA on CPAP   . PAD (peripheral artery disease) ([place])   . Peripheral vascular disease ([place])   . Type II diabetes mellitus ([place]) dx'd 11/2015    Past Surgical History:  Procedure Laterality Date  . AMPUTATION Right 12/01/2015   Procedure: AMPUTATION RIGHT GREAT;  Surgeon: Conrad Roeland Park, MD;  Location: [facility];  Service: Vascular;  Laterality: Right;  . AMPUTATION Right 12/16/2015   Procedure: AMPUTATION BELOW KNEE;  Surgeon: [name], MD;  Location: [facility];  Service: Vascular;  Laterality: Right;  . AMPUTATION Right 12/26/2015   Procedure: RIGHT ABOVE KNEE AMPUTATION;  Surgeon: [name], MD;  Location: [facility];  Service: Vascular;  Laterality: Right;  . COLONOSCOPY    . FEMORAL-POPLITEAL BYPASS GRAFT Right 11/28/2015   Procedure: RIGHT  FEMORAL-POPLITEAL ARTERY BYPASS GRAFT AND CONSTRUCTION OF MILLER CUFF USING 6 MM X 80 CM PROPATEN  GRAFT ;  Surgeon: Conrad Tabor, MD;  Location: [facility];  Service: Vascular;  Laterality: Right;  .  INGUINAL HERNIA REPAIR Bilateral 2000  . PERIPHERAL VASCULAR CATHETERIZATION N/A 11/26/2015   Procedure: Abdominal Aortogram;  Surgeon: [name], MD;  Location: [facility];  Service: Cardiovascular;  Laterality: N/A;  . VASECTOMY      There were no vitals filed for this visit.   Subjective Assessment - 10/18/18 1535    Subjective  This 67yo male underwent a right Transfemoral Amputaton on 12/26/2015 from failed femoral-popliteal bypass surgery. He received his first prosthesis 03/18/2016 and underwent PT for prosthetic training thru 07/08/2016. He recieved his first microprocessor (2nd prosthesis) prosthesis on 02/06/2017 and underwent  23 PT visits for prosthetic training 02/25/2017-08/12/2017. He requested more PT to improve his gait. He had to have prosthetic knee rebuilt. Patient presents to PT to improve gait & balance. He was referred for PT evaluation on 09/02/2018 by Jill Alexanders, MD.    Patient is accompained by:  Family member   wife, Adam [name]   Pertinent History  right TFA 12/26/2015, DM2, HTN, PVD/PAD    Patient Stated Goals  improve my gait especially inclines,     Currently in Pain?  No/denies         [facility] PT Assessment - 10/18/18 1530      Assessment   Medical Diagnosis  Right Transfemoral Amputation    Referring Provider (  PT)  Jill Alexanders, MD    Onset Date/Surgical Date  09/02/18   MD referral to PT   Hand Dominance  Right    Prior Therapy  23 visits 02/25/2017-08/12/2017.      Precautions   Precautions  Fall      Balance Screen   Has the patient fallen in the past 6 months  Yes    How many times?  1   prosthesis was not charged and gave out   Has the patient had a decrease in activity level because of a fear of falling?   No    Is the patient reluctant to leave their home because of a fear of falling?   No      Home Environment   Living Environment  Private residence    Living Arrangements  Spouse/significant other    Type of [place] to enter    Entrance Stairs-Number of Steps  3    Entrance Stairs-Rails  Right;Left;Can reach both    [place]  Two level    Alternate Level Stairs-Number of Steps  16    Alternate Level Stairs-Rails  Left    Home Equipment  Crutches;Cane - single point      Prior Function   Level of Independence  Independent;Independent with household mobility without device;Independent with community mobility without device    Leisure  golf, fishing, ride a bike      Posture/Postural Control   Posture/Postural Control  Postural limitations    Postural Limitations  Weight shift right      ROM / Strength   AROM / PROM / Strength  AROM;Strength      AROM   Overall AROM   Within functional limits for tasks performed      Strength   Overall Strength  Within functional limits for tasks performed      Transfers   Transfers  Sit to Stand;Stand to Sit    Sit to Stand  6: Modified independent (Device/Increase time);Without upper extremity assist;From chair/3-in-1    Stand to Sit  6: Modified independent (Device/Increase time);Without upper extremity assist;To chair/3-in-1      Ambulation/Gait   Ambulation/Gait  Yes    Ambulation/Gait Assistance  5: Supervision    Ambulation/Gait Assistance Details  minimal to no prosthetic knee flexion in stance, pelvic drop in stance due to prosthesis too short, uses Rt knee extension in stance (prosthesis in swing) and flexion in stance (prosthetic stance weight bearing)    Ambulation Distance (Feet)  300 Feet    Assistive device  Prosthesis;None    Gait Pattern  Step-through pattern;Decreased arm swing - left;Decreased step length - left;Decreased stance time - left;Decreased hip/knee flexion - left;Left hip hike;Right flexed knee in stance;Antalgic    Ambulation Surface  Indoor;Level    Gait velocity  3.33 ft/sec comfortable pace & 4.00 ft/sec fast pace    Stairs  Yes    Stairs Assistance  5: Supervision    Stair Management Technique  One rail  Right;Step to pattern;Two rails;Alternating pattern;Forwards   one rail step-to & 2 rails descend 4 steps alternating   Number of Stairs  4   1 rep step-to 1 rail & 3 reps 2 rails alternating descend   Height of Stairs  6    Ramp  5: Supervision   prosthesis only   Curb  5: Supervision   prosthesis only     Standardized Balance Assessment   Standardized  Balance Assessment  Berg Balance Test      Berg Balance Test   Sit to Stand  Able to stand without using hands and stabilize independently    Standing Unsupported  Able to stand safely 2 minutes    Sitting with Back Unsupported but Feet Supported on Floor or Stool  Able to sit safely and securely 2 minutes    Stand to Sit  Sits safely with minimal use of hands    Transfers  Able to transfer safely, minor use of hands    Standing Unsupported with Eyes Closed  Able to stand 10 seconds safely    Standing Ubsupported with Feet Together  Able to place feet together independently and stand 1 minute safely    From Standing, Reach Forward with Outstretched Arm  Can reach confidently >25 cm (10")    From Standing Position, Pick up Object from Floor  Able to pick up shoe safely and easily    From Standing Position, Turn to Look Behind Over each Shoulder  Looks behind from both sides and weight shifts well    Turn 360 Degrees  Able to turn 360 degrees safely but slowly    Standing Unsupported, Alternately Place Feet on Step/Stool  Able to stand independently and complete 8 steps >20 seconds    Standing Unsupported, One Foot in Front  Able to plae foot ahead of the other independently and hold 30 seconds    Standing on One Leg  Tries to lift leg/unable to hold 3 seconds but remains standing independently    Total Score  49      Functional Gait  Assessment   Gait assessed   Yes    Gait Level Surface  Walks 20 ft in less than 7 sec but greater than 5.5 sec, uses assistive device, slower speed, mild gait deviations, or deviates 6-10 in outside of the  12 in walkway width.    Change in Gait Speed  Able to change speed, demonstrates mild gait deviations, deviates 6-10 in outside of the 12 in walkway width, or no gait deviations, unable to achieve a major change in velocity, or uses a change in velocity, or uses an assistive device.    Gait with Horizontal Head Turns  Performs head turns smoothly with slight change in gait velocity (eg, minor disruption to smooth gait path), deviates 6-10 in outside 12 in walkway width, or uses an assistive device.    Gait with Vertical Head Turns  Performs task with slight change in gait velocity (eg, minor disruption to smooth gait path), deviates 6 - 10 in outside 12 in walkway width or uses assistive device    Gait and Pivot Turn  Pivot turns safely within 3 sec and stops quickly with no loss of balance.    Step Over Obstacle  Is able to step over one shoe box (4.5 in total height) without changing gait speed. No evidence of imbalance.    Gait with Narrow Base of Support  Ambulates less than 4 steps heel to toe or cannot perform without assistance.    Gait with Eyes Closed  Walks 20 ft, slow speed, abnormal gait pattern, evidence for imbalance, deviates 10-15 in outside 12 in walkway width. Requires more than 9 sec to ambulate 20 ft.    Ambulating Backwards  Walks 20 ft, uses assistive device, slower speed, mild gait deviations, deviates 6-10 in outside 12 in walkway width.    Steps  Two feet to a stair, must use rail.  Total Score  17      Prosthetics Assessment - 10/18/18 1530      Prosthetics   Prosthetic Care Independent with  Skin check;Residual limb care;Care of non-amputated limb;Prosthetic cleaning;Ply sock cleaning;Correct ply sock adjustment;Proper wear schedule/adjustment;Proper weight-bearing schedule/adjustment    Prosthetic Care Comments   Patient's prosthesis is3/8" too short. PT contacted prosthetist to request lengthening. PT instructed pt & wife in purchase of adjust-a-lift for RLE until gets  used to new height and reduce difference 1/8" (one layer of adjust-a-lift) ~ weekly    Donning prosthesis   Modified independent (Device/Increase time)    Doffing prosthesis   Modified independent (Device/Increase time)    Current prosthetic wear tolerance (days/week)   daily    Current prosthetic wear tolerance (#hours/day)   all awake hours    Current prosthetic weight-bearing tolerance (hours/day)   Pt tolerated 15 minutes of standing & gait without RLE pain.     Edema  non-pitting    Residual limb condition   Patient denies any skin integrity issues     K code/activity level with prosthetic use   K3 full community with variable cadence;  prosthesis is microprocessor Rheo, with suction ring gel liner.                Objective measurements completed on examination: See above findings.                PT Short Term Goals - 10/19/18 1920      PT SHORT TERM GOAL #1   Title  Patient verbalizes ability to accomodate prosthetic height adjustment. (All STGs Target Date: 11/18/2018)    Time  1    Period  Months    Status  New    Target Date  11/18/18      PT SHORT TERM GOAL #2   Title  Patient able to mount / dismount standard bicycle and pedal stationary bike with prosthesis 5 minutes.     Time  1    Period  Months    Status  New    Target Date  11/18/18      PT SHORT TERM GOAL #3   Title  Patient ambulates 300' with prosthetic knee flexion in swing >50% of steps.     Time  1    Period  Months    Status  New    Target Date  11/18/18        PT Long Term Goals - 10/19/18 1916      PT LONG TERM GOAL #1   Title  Patient verbalizes & demonstrates ongoing HEP / fitness plan including ability to ride bike (his goal) All LTGs Target Date: 01/14/2019    Time  3    Period  Months    Status  New    Target Date  01/14/19      PT LONG TERM GOAL #2   Title  Functional Gait Assessment >/= 23/ 30 to indicate lower fall risk with gait.     Time  3    Period  Months     Status  New    Target Date  01/14/19      PT LONG TERM GOAL #3   Title  Patient negotiates ramps with prosthetic knee control independently.     Time  3    Period  Months    Status  New    Target Date  01/14/19      PT LONG TERM GOAL #4  Title  Oceanographer >52/56 to indicate lower fall risk    Time  3    Period  Months    Status  New    Target Date  01/14/19      PT LONG TERM GOAL #5   Title  Patient able to increase gait velocity >1.0 ft/sec comfortable to fast pace.      Time  3    Period  Months    Status  New    Target Date  01/14/19             Plan - 10/18/18 1911    Clinical Impression Statement  Patient presents to PT with prosthetic gait deviations increasing energy expenditure and increasing fall risk. His prosthesis is 3/8" too short and he has associated pelvic drop & minimal to no prosthetic knee flexion in swing. Berg Balance test [phone] indicates moderate fall risk. Functinal Gait Assessment 17/30 indicates high fall risk.     History and Personal Factors relevant to plan of care:  right TFA 12/26/2015, DM2, HTN, PVD/PAD    Clinical Presentation  Stable    Clinical Decision Making  Low    Rehab Potential  Good    PT Frequency  1x / week    PT Duration  12 weeks    PT Treatment/Interventions  ADLs/Self Care Home Management;Canalith Repostioning;DME Instruction;Gait training;Stair training;Functional mobility training;Therapeutic activities;Therapeutic exercise;Balance training;Neuromuscular re-education;Cognitive remediation;Patient/family education;Prosthetic Training;Vestibular    PT Next Visit Plan  prosthetic gait with prosthesis at adjusted ht, Use adjust-a-lift in rt shoe until progress to LEs equal ht, instruct in stationary bike cycling    Consulted and Agree with Plan of Care  Patient;Family member/caregiver    Family Member Consulted  wife       Patient will benefit from skilled therapeutic intervention in order to improve the following deficits  and impairments:  Abnormal gait, Decreased activity tolerance, Decreased balance, Decreased knowledge of use of DME, Dizziness, Impaired flexibility, Postural dysfunction, Prosthetic Dependency  Visit Diagnosis: Other abnormalities of gait and mobility  Unsteadiness on feet  Other symptoms and signs involving the musculoskeletal system  Abnormal posture     Problem List Patient Active Problem List   Diagnosis Date Noted  . Glucose intolerance 01/18/2016  . Phantom pain after amputation of lower extremity ([place]) 01/18/2016  . S/P AKA (above knee amputation), right ([place]) 12/29/2015  . PAD (peripheral artery disease) ([place]) 12/15/2015  . Hyperlipidemia 12/07/2015  . Hypertension 12/04/2014    Jamey Reas PT, DPT 10/19/2018, 7:23 PM  [facility] [address] [place], [place], [address] Phone: [phone]   Fax:  [phone]  Name: KAILON TREESE MRN: [id number] Date of Birth: 07-Apr-1951

## 2018-10-26 ENCOUNTER — Ambulatory Visit: Payer: No Typology Code available for payment source | Admitting: Physical Therapy

## 2018-10-26 ENCOUNTER — Encounter: Payer: Self-pay | Admitting: Physical Therapy

## 2018-10-26 DIAGNOSIS — R2681 Unsteadiness on feet: Secondary | ICD-10-CM

## 2018-10-26 DIAGNOSIS — R2689 Other abnormalities of gait and mobility: Secondary | ICD-10-CM | POA: Diagnosis not present

## 2018-10-26 DIAGNOSIS — R29898 Other symptoms and signs involving the musculoskeletal system: Secondary | ICD-10-CM

## 2018-10-26 DIAGNOSIS — R293 Abnormal posture: Secondary | ICD-10-CM

## 2018-10-26 NOTE — Therapy (Signed)
[facility] [address] [place], [place], [address] Phone: [phone]   Fax:  [phone]  Physical Therapy Treatment  Patient Details  Name: Adam [name] MRN: [id number] Date of Birth: 07-31-1951 Referring Provider (PT): Jill Alexanders, MD   Encounter Date: 10/26/2018  PT End of Session - 10/26/18 1435    Visit Number  2    Number of Visits  13    Date for PT Re-Evaluation  01/14/19    Authorization Type  U[employer]    Authorization Time Period  [phone] deductible /oop; applied $587.36    VL: PT, OT, Sp, Chiro, pulmonary 30: zero used    Authorization - Visit Number  2    Authorization - Number of Visits  30    PT Start Time  [phone]    PT Stop Time  0930    PT Time Calculation (min)  43 min    Equipment Utilized During Treatment  Gait belt    Activity Tolerance  Patient tolerated treatment well    Behavior During Therapy  WFL for tasks assessed/performed       Past Medical History:  Diagnosis Date  . Gangrene ([place]) 11/22/2015   right great toe  . Hypertension   . OSA on CPAP   . PAD (peripheral artery disease) ([place])   . Peripheral vascular disease ([place])   . Type II diabetes mellitus ([place]) dx'd 11/2015    Past Surgical History:  Procedure Laterality Date  . AMPUTATION Right 12/01/2015   Procedure: AMPUTATION RIGHT GREAT;  Surgeon: Conrad Sublimity, MD;  Location: [facility];  Service: Vascular;  Laterality: Right;  . AMPUTATION Right 12/16/2015   Procedure: AMPUTATION BELOW KNEE;  Surgeon: [name], MD;  Location: [facility];  Service: Vascular;  Laterality: Right;  . AMPUTATION Right 12/26/2015   Procedure: RIGHT ABOVE KNEE AMPUTATION;  Surgeon: [name], MD;  Location: [facility];  Service: Vascular;  Laterality: Right;  . COLONOSCOPY    . FEMORAL-POPLITEAL BYPASS GRAFT Right 11/28/2015   Procedure: RIGHT  FEMORAL-POPLITEAL ARTERY BYPASS GRAFT AND CONSTRUCTION OF MILLER CUFF USING 6 MM X 80 CM PROPATEN  GRAFT ;  Surgeon: Conrad Hamilton, MD;  Location: [facility];  Service: Vascular;  Laterality: Right;  . INGUINAL HERNIA REPAIR Bilateral 2000  . PERIPHERAL VASCULAR CATHETERIZATION N/A 11/26/2015   Procedure: Abdominal Aortogram;  Surgeon: [name], MD;  Location: [facility];  Service: Cardiovascular;  Laterality: N/A;  . VASECTOMY      There were no vitals filed for this visit.  Subjective Assessment - 10/26/18 0849    Subjective  He had prosthesis lengthened yesterday. He did purchase adjust-a-lift but waited for PT to put in his shoe.     Pertinent History  right TFA 12/26/2015, DM2, HTN, PVD/PAD    Patient Stated Goals  improve my gait especially inclines,     Currently in Pain?  No/denies                       [facility] Adult PT Treatment/Exercise - 10/26/18 0845      Ambulation/Gait   Ambulation/Gait  Yes    Ambulation/Gait Assistance  4: Min assist;5: Supervision    Ambulation/Gait Assistance Details  minA when going slow to work on knee flexion;  worked on carryover of pre-gait noted below outside parallel bars.     Ambulation Distance (Feet)  300 Feet   300' outside parallel bars; multiple  laps in parallel bars   Assistive device  Prosthesis;None;1 person hand held assist;Parallel bars   counter on right side   Ambulation Surface  Indoor;Level    Pre-Gait Activities  pre-gait in parallel bars with mirrors for visual feedback, midline board to control step width, PT demo, tactile & verbal cues:  step 1 wt shift with prosthesis in terminal stance to unlock knee, add step 2 advancing prosthesis with more pelvic motion so Rt ASIS ~5* ant. rotation, progress to full pelvic wt shift over prosthesis in stance with upright posture.        Therapeutic Activites    Therapeutic Activities  Other Therapeutic Activities    Other Therapeutic Activities  PT demo pedaling stationary bike with TFA prosthesis. Pt return demo understanding seated on 29" bar stool with LE ergometer. PT demo mounting  / dismounting simulated bicycle standing on right side and swinging LLE over seat until standing straddling center bike bar. Pt return demo & verbalized understanding.  Pt does not have a stationary bike so PT recommended purchasing converter so he can use his bike as stationary since his goal is to ride his bike. Pt verbalized understanding.       Prosthetics   Prosthetic Care Comments   Prosthetist lengthened prosthesis 3/8" and PT placed adjust-a-lift in left shoe with recommendation to decrease one layer (~1/8") each week until BLEs same height.     Education Provided  Other (comment)   see prosthetic care comments   Person(s) Educated  Patient    Education Method  Explanation;Demonstration;Verbal cues    Education Method  Verbalized understanding               PT Short Term Goals - 10/26/18 1436      PT SHORT TERM GOAL #1   Title  Patient verbalizes ability to accomodate prosthetic height adjustment. (All STGs Target Date: 11/18/2018)    Time  1    Period  Months    Status  On-going    Target Date  11/18/18      PT SHORT TERM GOAL #2   Title  Patient able to mount / dismount standard bicycle and pedal stationary bike with prosthesis 5 minutes.     Time  1    Period  Months    Status  On-going    Target Date  11/18/18      PT SHORT TERM GOAL #3   Title  Patient ambulates 300' with prosthetic knee flexion in swing >50% of steps.     Time  1    Period  Months    Status  On-going    Target Date  11/18/18        PT Long Term Goals - 10/26/18 1437      PT LONG TERM GOAL #1   Title  Patient verbalizes & demonstrates ongoing HEP / fitness plan including ability to ride bike (his goal) All LTGs Target Date: 01/14/2019    Time  3    Period  Months    Status  On-going    Target Date  01/14/19      PT LONG TERM GOAL #2   Title  Functional Gait Assessment >/= 23/ 30 to indicate lower fall risk with gait.     Time  3    Period  Months    Status  On-going    Target Date   01/14/19      PT LONG TERM GOAL #3   Title  Patient  negotiates ramps with prosthetic knee control independently.     Time  3    Period  Months    Status  On-going    Target Date  01/14/19      PT LONG TERM GOAL #4   Title  Berg Balance >52/56 to indicate lower fall risk    Time  3    Period  Months    Status  On-going    Target Date  01/14/19      PT LONG TERM GOAL #5   Title  Patient able to increase gait velocity >1.0 ft/sec comfortable to fast pace.      Time  3    Period  Months    Status  On-going    Target Date  01/14/19            Plan - 10/26/18 1437    Clinical Impression Statement  Patient improved control of prosthesis with knee flexion in swing with skilled instruction in technique and slow controlled movements. He has general understanding how to pedal stationary bike with TFA prosthesis.     Rehab Potential  Good    PT Frequency  1x / week    PT Duration  12 weeks    PT Treatment/Interventions  ADLs/Self Care Home Management;Canalith Repostioning;DME Instruction;Gait training;Stair training;Functional mobility training;Therapeutic activities;Therapeutic exercise;Balance training;Neuromuscular re-education;Cognitive remediation;Patient/family education;Prosthetic Training;Vestibular    PT Next Visit Plan  reduce Left shoe lift one layer. review pre-gait/gait from this week; work on turning figure 8's around objects. ask how cycling is going with stationary bike.    Consulted and Agree with Plan of Care  Patient;Family member/caregiver    Family Member Consulted  wife       Patient will benefit from skilled therapeutic intervention in order to improve the following deficits and impairments:  Abnormal gait, Decreased activity tolerance, Decreased balance, Decreased knowledge of use of DME, Dizziness, Impaired flexibility, Postural dysfunction, Prosthetic Dependency  Visit Diagnosis: Other abnormalities of gait and mobility  Unsteadiness on feet  Other  symptoms and signs involving the musculoskeletal system  Abnormal posture     Problem List Patient Active Problem List   Diagnosis Date Noted  . Glucose intolerance 01/18/2016  . Phantom pain after amputation of lower extremity ([place]) 01/18/2016  . S/P AKA (above knee amputation), right ([facility]) 12/29/2015  . PAD (peripheral artery disease) ([place]) 12/15/2015  . Hyperlipidemia 12/07/2015  . Hypertension 12/04/2014    Jamey Reas PT, DPT 10/26/2018, 2:40 PM  [facility] [address] [place], [place], [address] Phone: [phone]   Fax:  [phone]  Name: Adam [name] MRN: [id number] Date of Birth: 06-Jan-1951

## 2018-11-02 ENCOUNTER — Ambulatory Visit: Payer: No Typology Code available for payment source | Admitting: Physical Therapy

## 2018-11-02 ENCOUNTER — Encounter: Payer: Self-pay | Admitting: Physical Therapy

## 2018-11-02 DIAGNOSIS — R2681 Unsteadiness on feet: Secondary | ICD-10-CM

## 2018-11-02 DIAGNOSIS — R2689 Other abnormalities of gait and mobility: Secondary | ICD-10-CM

## 2018-11-02 DIAGNOSIS — R293 Abnormal posture: Secondary | ICD-10-CM

## 2018-11-02 DIAGNOSIS — R29898 Other symptoms and signs involving the musculoskeletal system: Secondary | ICD-10-CM

## 2018-11-03 NOTE — Therapy (Signed)
[facility] [address] [place], [place], [address] Phone: [phone]   Fax:  [phone]  Physical Therapy Treatment  Patient Details  Name: Adam [name] MRN: [id number] Date of Birth: 08-04-1951 Referring Provider (PT): Jill Alexanders, MD   Encounter Date: 11/02/2018  PT End of Session - 11/02/18 1818    Visit Number  3    Number of Visits  13    Date for PT Re-Evaluation  01/14/19    Authorization Type  UHC    Authorization Time Period  [phone] deductible /oop; applied $587.36    VL: PT, OT, Sp, Chiro, pulmonary 30: zero used    Authorization - Visit Number  3    Authorization - Number of Visits  30    PT Start Time  0845    PT Stop Time  0928    PT Time Calculation (min)  43 min    Equipment Utilized During Treatment  Gait belt    Activity Tolerance  Patient tolerated treatment well    Behavior During Therapy  WFL for tasks assessed/performed       Past Medical History:  Diagnosis Date  . Gangrene ([facility]) 11/22/2015   right great toe  . Hypertension   . OSA on CPAP   . PAD (peripheral artery disease) ([facility])   . Peripheral vascular disease ([facility])   . Type II diabetes mellitus ([place]) dx'd 11/2015    Past Surgical History:  Procedure Laterality Date  . AMPUTATION Right 12/01/2015   Procedure: AMPUTATION RIGHT GREAT;  Surgeon: Conrad Revloc, MD;  Location: [facility];  Service: Vascular;  Laterality: Right;  . AMPUTATION Right 12/16/2015   Procedure: AMPUTATION BELOW KNEE;  Surgeon: [name], MD;  Location: [facility];  Service: Vascular;  Laterality: Right;  . AMPUTATION Right 12/26/2015   Procedure: RIGHT ABOVE KNEE AMPUTATION;  Surgeon: [name], MD;  Location: [facility];  Service: Vascular;  Laterality: Right;  . COLONOSCOPY    . FEMORAL-POPLITEAL BYPASS GRAFT Right 11/28/2015   Procedure: RIGHT  FEMORAL-POPLITEAL ARTERY BYPASS GRAFT AND CONSTRUCTION OF MILLER CUFF USING 6 MM X 80 CM PROPATEN  GRAFT ;  Surgeon: Conrad [place], MD;  Location: [facility];  Service: Vascular;  Laterality: Right;  . INGUINAL HERNIA REPAIR Bilateral 2000  . PERIPHERAL VASCULAR CATHETERIZATION N/A 11/26/2015   Procedure: Abdominal Aortogram;  Surgeon: [name], MD;  Location: [facility];  Service: Cardiovascular;  Laterality: N/A;  . VASECTOMY      There were no vitals filed for this visit.     11/02/18 0845  Symptoms/Limitations  Subjective No falls. He has not riden stationary bike yet. He does not sense a difference.   Pertinent History right TFA 12/26/2015, DM2, HTN, PVD/PAD  Patient Stated Goals improve my gait especially inclines,   Pain Assessment  Currently in Pain? No/denies     11/02/18 0845  Ambulation/Gait  Ambulation/Gait Yes  Ambulation/Gait Assistance 4: Min assist;5: Supervision  Ambulation/Gait Assistance Details worked on figure 8 turns around obstacles, turning 90* around furniture, stationary stance with 90* turn rt/lt, progressed to intermixing all skills to alternate focus.   Ambulation Distance (Feet) 500 Feet (ambulated >37minutes working on skills)  Assistive device Prosthesis;None;Parallel bars  Ambulation Surface Level;Indoor  High Level Balance  High Level Balance Activities Turns;Figure 8 turns;Negotitating around obstacles  High Level Balance Comments tactile, demo & verbal cues  Prosthetics  Prosthetic Care Comments  reduced 1/8" today for 1/2"  difference now  Education Provided Other (comment) (see prosthetic care comments)                          11/02/18 1830  Plan  Clinical Impression Statement Today's skilled session focused on negotiating around obstacles, 90* turns right & left around furniture and stationary stance 90* turn right & left. He improved with skilled instructions and repetition.   Pt will benefit from skilled therapeutic intervention in order to improve on the following deficits Abnormal gait;Decreased activity tolerance;Decreased  balance;Decreased knowledge of use of DME;Dizziness;Impaired flexibility;Postural dysfunction;Prosthetic Dependency  Rehab Potential Good  PT Frequency 1x / week  PT Duration 12 weeks  PT Treatment/Interventions ADLs/Self Care Home Management;Canalith Repostioning;DME Instruction;Gait training;Stair training;Functional mobility training;Therapeutic activities;Therapeutic exercise;Balance training;Neuromuscular re-education;Cognitive remediation;Patient/family education;Prosthetic Training;Vestibular  PT Next Visit Plan reduce Left shoe lift one layer further. review pre-gait/gait from this week; work on gait with scanning. review cycling  Consulted and Agree with Plan of Care Patient;Family member/caregiver  Family Member Consulted wife       PT Short Term Goals - 10/26/18 1436      PT SHORT TERM GOAL #1   Title  Patient verbalizes ability to accomodate prosthetic height adjustment. (All STGs Target Date: 11/18/2018)    Time  1    Period  Months    Status  On-going    Target Date  11/18/18      PT SHORT TERM GOAL #2   Title  Patient able to mount / dismount standard bicycle and pedal stationary bike with prosthesis 5 minutes.     Time  1    Period  Months    Status  On-going    Target Date  11/18/18      PT SHORT TERM GOAL #3   Title  Patient ambulates 300' with prosthetic knee flexion in swing >50% of steps.     Time  1    Period  Months    Status  On-going    Target Date  11/18/18        PT Long Term Goals - 10/26/18 1437      PT LONG TERM GOAL #1   Title  Patient verbalizes & demonstrates ongoing HEP / fitness plan including ability to ride bike (his goal) All LTGs Target Date: 01/14/2019    Time  3    Period  Months    Status  On-going    Target Date  01/14/19      PT LONG TERM GOAL #2   Title  Functional Gait Assessment >/= 23/ 30 to indicate lower fall risk with gait.     Time  3    Period  Months    Status  On-going    Target Date  01/14/19      PT LONG TERM  GOAL #3   Title  Patient negotiates ramps with prosthetic knee control independently.     Time  3    Period  Months    Status  On-going    Target Date  01/14/19      PT LONG TERM GOAL #4   Title  Berg Balance >52/56 to indicate lower fall risk    Time  3    Period  Months    Status  On-going    Target Date  01/14/19      PT LONG TERM GOAL #5   Title  Patient able to increase gait velocity >1.0 ft/sec comfortable to fast  pace.      Time  3    Period  Months    Status  On-going    Target Date  01/14/19              Patient will benefit from skilled therapeutic intervention in order to improve the following deficits and impairments:     Visit Diagnosis: Other abnormalities of gait and mobility  Unsteadiness on feet  Other symptoms and signs involving the musculoskeletal system  Abnormal posture     Problem List Patient Active Problem List   Diagnosis Date Noted  . Glucose intolerance 01/18/2016  . Phantom pain after amputation of lower extremity ([facility]) 01/18/2016  . S/P AKA (above knee amputation), right ([facility]) 12/29/2015  . PAD (peripheral artery disease) ([facility]) 12/15/2015  . Hyperlipidemia 12/07/2015  . Hypertension 12/04/2014    Shakema Surita PT, DPT 11/03/2018, 6:21 PM  [facility] [address] [place], [place], [address] Phone: [phone]   Fax:  [phone]  Name: Adam [name] MRN: [id number] Date of Birth: [birth year]

## 2018-11-09 ENCOUNTER — Ambulatory Visit: Payer: No Typology Code available for payment source | Admitting: Physical Therapy

## 2018-11-09 ENCOUNTER — Encounter: Payer: Self-pay | Admitting: Physical Therapy

## 2018-11-09 DIAGNOSIS — R293 Abnormal posture: Secondary | ICD-10-CM

## 2018-11-09 DIAGNOSIS — R2681 Unsteadiness on feet: Secondary | ICD-10-CM

## 2018-11-09 DIAGNOSIS — R2689 Other abnormalities of gait and mobility: Secondary | ICD-10-CM

## 2018-11-09 DIAGNOSIS — R29898 Other symptoms and signs involving the musculoskeletal system: Secondary | ICD-10-CM

## 2018-11-11 NOTE — Therapy (Signed)
[facility] [address] [place], [place], [address] Phone: [phone]   Fax:  [phone]  Physical Therapy Treatment  Patient Details  Name: Adam [name] MRN: [id number] Date of Birth: [birth year] Referring Provider (PT): Jill Alexanders, MD   Encounter Date: 11/09/2018     11/09/18 0931  PT Visits / Re-Eval  Visit Number 4  Number of Visits 13  Date for PT Re-Evaluation 01/14/19  Authorization  Authorization Type UHC  Authorization Time Period [phone] deductible /oop; applied $587.36    VL: PT, OT, Sp, Chiro, pulmonary 30: zero used  Authorization - Visit Number 4  Authorization - Number of Visits 30  PT Time Calculation  PT Start Time 0845  PT Stop Time 0930  PT Time Calculation (min) 45 min  PT - End of Session  Equipment Utilized During Treatment Gait belt  Activity Tolerance Patient tolerated treatment well  Behavior During Therapy [facility] for tasks assessed/performed    Past Medical History:  Diagnosis Date  . Gangrene ([place]) 11/22/2015   right great toe  . Hypertension   . OSA on CPAP   . PAD (peripheral artery disease) ([place])   . Peripheral vascular disease ([facility])   . Type II diabetes mellitus ([place]) dx'd 11/2015    Past Surgical History:  Procedure Laterality Date  . AMPUTATION Right 12/01/2015   Procedure: AMPUTATION RIGHT GREAT;  Surgeon: Conrad Lockwood, MD;  Location: [facility];  Service: Vascular;  Laterality: Right;  . AMPUTATION Right 12/16/2015   Procedure: AMPUTATION BELOW KNEE;  Surgeon: [name], MD;  Location: [facility];  Service: Vascular;  Laterality: Right;  . AMPUTATION Right 12/26/2015   Procedure: RIGHT ABOVE KNEE AMPUTATION;  Surgeon: [name], MD;  Location: [facility];  Service: Vascular;  Laterality: Right;  . COLONOSCOPY    . FEMORAL-POPLITEAL BYPASS GRAFT Right 11/28/2015   Procedure: RIGHT  FEMORAL-POPLITEAL ARTERY BYPASS GRAFT AND CONSTRUCTION OF MILLER CUFF USING 6 MM X 80 CM PROPATEN   GRAFT ;  Surgeon: Conrad , MD;  Location: [facility];  Service: Vascular;  Laterality: Right;  . INGUINAL HERNIA REPAIR Bilateral 2000  . PERIPHERAL VASCULAR CATHETERIZATION N/A 11/26/2015   Procedure: Abdominal Aortogram;  Surgeon: [name], MD;  Location: [facility];  Service: Cardiovascular;  Laterality: N/A;  . VASECTOMY      There were no vitals filed for this visit.     11/09/18 0845  Symptoms/Limitations  Subjective No falls. He has not caught prosthetic toe with initial adjustment of height.   Pertinent History right TFA 12/26/2015, DM2, HTN, PVD/PAD  Patient Stated Goals improve my gait especially inclines,   Pain Assessment  Currently in Pain? No/denies   PT decreased shoe lift in sound limb so LEs now within 1/8". Patient ambulated 300' using line on floor for visual reference to work on not abducting and verbal & tactile cues. Treadmill 1.60mh working on step width, upright posture and maintain prosthesis in line of progression in swing, and uphill & downhill 5%-9%.  Overground pt ambulated >500' working to carryover above cues. Gait with yellow theraband tied around LEs figure 8's but limited prosthetic step length.  Exercise with LLE on 2" block for prosthetic clearance without hip hiking yellow theraband figure 8 around thighs: active hip flexion & extension. Pt verbalized & return demo understanding for HEP.   He received his kit to convert his bicycle to stationary. PT & pt discussed how height of middle bar  for men's vs women's bike may make straddling bike difficult.                          11/09/18 1800  Plan  Clinical Impression Statement PT lowered left shoe lift 1/8" again which makes prosthesis now only 1/8" too short. His prosthetic gait initially was abducted with height change and he improved by end of session. PT added new exercise of active hip flexion/extension with yellow theraband to facilitate prosthesis in line of  progression.   Pt will benefit from skilled therapeutic intervention in order to improve on the following deficits Abnormal gait;Decreased activity tolerance;Decreased balance;Decreased knowledge of use of DME;Dizziness;Impaired flexibility;Postural dysfunction;Prosthetic Dependency  Rehab Potential Good  PT Frequency 1x / week  PT Duration 12 weeks  PT Treatment/Interventions ADLs/Self Care Home Management;Canalith Repostioning;DME Instruction;Gait training;Stair training;Functional mobility training;Therapeutic activities;Therapeutic exercise;Balance training;Neuromuscular re-education;Cognitive remediation;Patient/family education;Prosthetic Training;Vestibular  PT Next Visit Plan check STGs, reduce Left shoe lift one layer further to equal LE height. review pre-gait/gait from this week; work on gait with scanning. review cycling  Consulted and Agree with Plan of Care Patient      PT Short Term Goals - 10/26/18 1436      PT SHORT TERM GOAL #1   Title  Patient verbalizes ability to accomodate prosthetic height adjustment. (All STGs Target Date: 11/18/2018)    Time  1    Period  Months    Status  On-going    Target Date  11/18/18      PT SHORT TERM GOAL #2   Title  Patient able to mount / dismount standard bicycle and pedal stationary bike with prosthesis 5 minutes.     Time  1    Period  Months    Status  On-going    Target Date  11/18/18      PT SHORT TERM GOAL #3   Title  Patient ambulates 300' with prosthetic knee flexion in swing >50% of steps.     Time  1    Period  Months    Status  On-going    Target Date  11/18/18        PT Long Term Goals - 10/26/18 1437      PT LONG TERM GOAL #1   Title  Patient verbalizes & demonstrates ongoing HEP / fitness plan including ability to ride bike (his goal) All LTGs Target Date: 01/14/2019    Time  3    Period  Months    Status  On-going    Target Date  01/14/19      PT LONG TERM GOAL #2   Title  Functional Gait Assessment >/=  23/ 30 to indicate lower fall risk with gait.     Time  3    Period  Months    Status  On-going    Target Date  01/14/19      PT LONG TERM GOAL #3   Title  Patient negotiates ramps with prosthetic knee control independently.     Time  3    Period  Months    Status  On-going    Target Date  01/14/19      PT LONG TERM GOAL #4   Title  Berg Balance >52/56 to indicate lower fall risk    Time  3    Period  Months    Status  On-going    Target Date  01/14/19      PT LONG TERM  GOAL #5   Title  Patient able to increase gait velocity >1.0 ft/sec comfortable to fast pace.      Time  3    Period  Months    Status  On-going    Target Date  01/14/19              Patient will benefit from skilled therapeutic intervention in order to improve the following deficits and impairments:     Visit Diagnosis: Other abnormalities of gait and mobility  Unsteadiness on feet  Other symptoms and signs involving the musculoskeletal system  Abnormal posture     Problem List Patient Active Problem List   Diagnosis Date Noted  . Glucose intolerance 01/18/2016  . Phantom pain after amputation of lower extremity ([facility]) 01/18/2016  . S/P AKA (above knee amputation), right ([facility]) 12/29/2015  . PAD (peripheral artery disease) ([facility]) 12/15/2015  . Hyperlipidemia 12/07/2015  . Hypertension 12/04/2014    Lithzy Bernard PT, DPT 11/11/2018, 6:32 AM  [facility] [address] [place], [place], [address] Phone: [phone]   Fax:  [phone]  Name: Adam [name] MRN: [id number] Date of Birth: 1951-04-09

## 2018-11-16 ENCOUNTER — Ambulatory Visit: Payer: No Typology Code available for payment source | Admitting: Physical Therapy

## 2018-11-18 ENCOUNTER — Ambulatory Visit: Payer: No Typology Code available for payment source | Attending: Family Medicine | Admitting: Physical Therapy

## 2018-11-18 ENCOUNTER — Encounter: Payer: Self-pay | Admitting: Physical Therapy

## 2018-11-18 DIAGNOSIS — R2689 Other abnormalities of gait and mobility: Secondary | ICD-10-CM | POA: Insufficient documentation

## 2018-11-18 DIAGNOSIS — R293 Abnormal posture: Secondary | ICD-10-CM | POA: Diagnosis present

## 2018-11-18 DIAGNOSIS — R2681 Unsteadiness on feet: Secondary | ICD-10-CM | POA: Diagnosis present

## 2018-11-18 DIAGNOSIS — R29898 Other symptoms and signs involving the musculoskeletal system: Secondary | ICD-10-CM | POA: Diagnosis present

## 2018-11-18 NOTE — Therapy (Signed)
[facility] [address] [place], [place], [address] Phone: [phone]   Fax:  [phone]  Physical Therapy Treatment  Patient Details  Name: Adam [name] MRN: [id number] Date of Birth: July 24, 1951 Referring Provider (PT): Jill Alexanders, MD   Encounter Date: 11/18/2018  PT End of Session - 11/18/18 1251    Visit Number  5    Number of Visits  13    Date for PT Re-Evaluation  01/14/19    Authorization Type  UHC    Authorization Time Period  [phone] deductible /oop; applied $587.36    VL: PT, OT, Sp, Chiro, pulmonary 30: zero used  Reset for 2020    Authorization - Visit Number  1    Authorization - Number of Visits  30    PT Start Time  1015    PT Stop Time  1055    PT Time Calculation (min)  40 min    Equipment Utilized During Treatment  Gait belt    Activity Tolerance  Patient tolerated treatment well    Behavior During Therapy  [facility] for tasks assessed/performed       Past Medical History:  Diagnosis Date  . Gangrene ([place] [place]) 11/22/2015   right great toe  . Hypertension   . OSA on CPAP   . PAD (peripheral artery disease) ([facility])   . Peripheral vascular disease ([facility])   . Type II diabetes mellitus ([place]) dx'd 11/2015    Past Surgical History:  Procedure Laterality Date  . AMPUTATION Right 12/01/2015   Procedure: AMPUTATION RIGHT GREAT;  Surgeon: Conrad [place], MD;  Location: [facility];  Service: Vascular;  Laterality: Right;  . AMPUTATION Right 12/16/2015   Procedure: AMPUTATION BELOW KNEE;  Surgeon: [name], MD;  Location: [facility];  Service: Vascular;  Laterality: Right;  . AMPUTATION Right 12/26/2015   Procedure: RIGHT ABOVE KNEE AMPUTATION;  Surgeon: [name], MD;  Location: [facility];  Service: Vascular;  Laterality: Right;  . COLONOSCOPY    . FEMORAL-POPLITEAL BYPASS GRAFT Right 11/28/2015   Procedure: RIGHT  FEMORAL-POPLITEAL ARTERY BYPASS GRAFT AND CONSTRUCTION OF MILLER CUFF USING 6 MM X 80 CM PROPATEN  GRAFT ;   Surgeon: Conrad , MD;  Location: [facility];  Service: Vascular;  Laterality: Right;  . INGUINAL HERNIA REPAIR Bilateral 2000  . PERIPHERAL VASCULAR CATHETERIZATION N/A 11/26/2015   Procedure: Abdominal Aortogram;  Surgeon: [name], MD;  Location: [facility];  Service: Cardiovascular;  Laterality: N/A;  . VASECTOMY      There were no vitals filed for this visit.  Subjective Assessment - 11/18/18 1018    Subjective  No falls. He has not caught prosthetic toe with ht change last week. He has not tried bicycle because conversion kit requires more than he is comfortable doing.     Pertinent History  right TFA 12/26/2015, DM2, HTN, PVD/PAD    Patient Stated Goals  improve my gait especially inclines,     Currently in Pain?  No/denies                       [facility] Adult PT Treatment/Exercise - 11/18/18 1015      Ambulation/Gait   Ambulation/Gait  Yes    Ambulation/Gait Assistance  5: Supervision    Ambulation/Gait Assistance Details  tactile & verbal cues on increase stance duration on prosthesis and decreasing circumduction / abduction.  PT instructed in using walking stick on prosthetic side &  timing with contralateral LE to facilitate increased stance duration, weight shift and less abduction.     Ambulation Distance (Feet)  500 Feet   ambulated >26mnutes working on skills   Assistive device  Prosthesis;None;Parallel bars    Ambulation Surface  Indoor;Level;Outdoor;Grass;Paved;Other (comment)   grass slopes     High Level Balance   High Level Balance Activities  Turns;Negotitating around obstacles    High Level Balance Comments  tactile, demo & verbal cues      Prosthetics   Prosthetic Care Comments   took out last of adjustable heel wedge in left shoe and prosthesis now has equal pelvic heights.  PT instructed in his habit to stand with left knee flexed due to long term having prosthesis too short. He was able to correct with verbal cues.     Current  prosthetic wear tolerance (days/week)   daily    Current prosthetic wear tolerance (#hours/day)   all awake hours    Education Provided  Other (comment)   see prosthetic care comments   Person(s) Educated  Patient    Education Method  Explanation;Verbal cues    Education Method  Verbalized understanding               PT Short Term Goals - 11/18/18 1252      PT SHORT TERM GOAL #1   Title  Patient verbalizes ability to accomodate prosthetic height adjustment. (All STGs Target Date: 11/18/2018)    Baseline  MET 11/18/2018    Time  1    Period  Months    Status  Achieved      PT SHORT TERM GOAL #2   Title  Patient able to mount / dismount standard bicycle and pedal stationary bike with prosthesis 5 minutes.     Baseline  Partially MET 11/18/2018 He verbalizes & return demo understanding how to mount/dismount and pedal bike. He is working on set-up for home.     Time  1    Period  Months    Status  Partially Met      PT SHORT TERM GOAL #3   Title  Patient ambulates 300' with prosthetic knee flexion in swing >50% of steps.     Baseline  MET 11/18/2018    Time  1    Period  Months    Status  Achieved        PT Short Term Goals - 11/18/18 1304      PT SHORT TERM GOAL #1   Title  Patient negotiates furniture & change directions of movements with prosthetic knee engaged. (All updated STGs Target Date: 12/17/2018)    Time  1    Period  Months    Status  New    Target Date  12/17/18      PT SHORT TERM GOAL #2   Title  Patient verbalizes ability to ride stationary bicycle at home for 10 minutes.     Time  1    Period  Months    Status  New    Target Date  12/17/18      PT SHORT TERM GOAL #3   Title  Patient is able to change gait velocity with minimal increase of deviations.     Time  1    Period  Months    Status  New    Target Date  12/17/18        PT Long Term Goals - 10/26/18 1437      PT LONG TERM  GOAL #1   Title  Patient verbalizes & demonstrates ongoing HEP /  fitness plan including ability to ride bike (his goal) All LTGs Target Date: 01/14/2019    Time  3    Period  Months    Status  On-going    Target Date  01/14/19      PT LONG TERM GOAL #2   Title  Functional Gait Assessment >/= 23/ 30 to indicate lower fall risk with gait.     Time  3    Period  Months    Status  On-going    Target Date  01/14/19      PT LONG TERM GOAL #3   Title  Patient negotiates ramps with prosthetic knee control independently.     Time  3    Period  Months    Status  On-going    Target Date  01/14/19      PT LONG TERM GOAL #4   Title  Berg Balance >52/56 to indicate lower fall risk    Time  3    Period  Months    Status  On-going    Target Date  01/14/19      PT LONG TERM GOAL #5   Title  Patient able to increase gait velocity >1.0 ft/sec comfortable to fast pace.      Time  3    Period  Months    Status  On-going    Target Date  01/14/19            Plan - 11/18/18 1253    Clinical Impression Statement  Patient's prosthesis is now equal height to left LE. He has habit of standing with left knee flexed from years of short prosthesis per his request. He is tolerating height change well with slow progression.  He improved stance duration on prosthesis with skilled instruction. Worked on ambulating on inclines outdoors and trusting prosthetic knee with PT assistance.     Rehab Potential  Good    PT Frequency  1x / week    PT Duration  12 weeks    PT Treatment/Interventions  ADLs/Self Care Home Management;Canalith Repostioning;DME Instruction;Gait training;Stair training;Functional mobility training;Therapeutic activities;Therapeutic exercise;Balance training;Neuromuscular re-education;Cognitive remediation;Patient/family education;Prosthetic Training;Vestibular    PT Next Visit Plan  work towards updated STGs.     Consulted and Agree with Plan of Care  Patient       Patient will benefit from skilled therapeutic intervention in order to improve the  following deficits and impairments:  Abnormal gait, Decreased activity tolerance, Decreased balance, Decreased knowledge of use of DME, Dizziness, Impaired flexibility, Postural dysfunction, Prosthetic Dependency  Visit Diagnosis: Other abnormalities of gait and mobility  Unsteadiness on feet  Other symptoms and signs involving the musculoskeletal system  Abnormal posture     Problem List Patient Active Problem List   Diagnosis Date Noted  . Glucose intolerance 01/18/2016  . Phantom pain after amputation of lower extremity ([place]) 01/18/2016  . S/P AKA (above knee amputation), right ([place]) 12/29/2015  . PAD (peripheral artery disease) ([place]) 12/15/2015  . Hyperlipidemia 12/07/2015  . Hypertension 12/04/2014    Corday Wyka PT, DPT 11/18/2018, 1:03 PM  [facility] [address] [place], [place], [address] Phone: [phone]   Fax:  [phone]  Name: Adam [name] MRN: [id number] Date of Birth: 30-Jan-1951

## 2018-11-22 ENCOUNTER — Ambulatory Visit: Payer: No Typology Code available for payment source | Admitting: Physical Therapy

## 2018-11-22 ENCOUNTER — Encounter: Payer: Self-pay | Admitting: Physical Therapy

## 2018-11-22 DIAGNOSIS — R2681 Unsteadiness on feet: Secondary | ICD-10-CM

## 2018-11-22 DIAGNOSIS — R2689 Other abnormalities of gait and mobility: Secondary | ICD-10-CM | POA: Diagnosis not present

## 2018-11-22 DIAGNOSIS — R293 Abnormal posture: Secondary | ICD-10-CM

## 2018-11-22 DIAGNOSIS — R29898 Other symptoms and signs involving the musculoskeletal system: Secondary | ICD-10-CM

## 2018-11-23 NOTE — Therapy (Signed)
[facility] [address] [place], [place], [address] Phone: [phone]   Fax:  [phone]  Physical Therapy Treatment  Patient Details  Name: Adam [name] MRN: [id number] Date of Birth: 1951/09/18 Referring Provider (PT): Jill Alexanders, MD   Encounter Date: 11/22/2018  PT End of Session - 11/22/18 1438    Visit Number  6    Number of Visits  13    Date for PT Re-Evaluation  01/14/19    Authorization Type  UHC    Authorization Time Period  [phone] deductible /oop; applied $587.36    VL: PT, OT, Sp, Chiro, pulmonary 30: zero used  Reset for 2020    Authorization - Visit Number  2    Authorization - Number of Visits  30    PT Start Time  1400    PT Stop Time  1433    PT Time Calculation (min)  33 min    Equipment Utilized During Treatment  Gait belt    Activity Tolerance  Patient tolerated treatment well    Behavior During Therapy  [facility] for tasks assessed/performed       Past Medical History:  Diagnosis Date  . Gangrene ([place]) 11/22/2015   right great toe  . Hypertension   . OSA on CPAP   . PAD (peripheral artery disease) ([place])   . Peripheral vascular disease ([place])   . Type II diabetes mellitus ([place]) dx'd 11/2015    Past Surgical History:  Procedure Laterality Date  . AMPUTATION Right 12/01/2015   Procedure: AMPUTATION RIGHT GREAT;  Surgeon: Conrad [place], MD;  Location: [facility];  Service: Vascular;  Laterality: Right;  . AMPUTATION Right 12/16/2015   Procedure: AMPUTATION BELOW KNEE;  Surgeon: [name], MD;  Location: [facility];  Service: Vascular;  Laterality: Right;  . AMPUTATION Right 12/26/2015   Procedure: RIGHT ABOVE KNEE AMPUTATION;  Surgeon: [name], MD;  Location: [facility];  Service: Vascular;  Laterality: Right;  . COLONOSCOPY    . FEMORAL-POPLITEAL BYPASS GRAFT Right 11/28/2015   Procedure: RIGHT  FEMORAL-POPLITEAL ARTERY BYPASS GRAFT AND CONSTRUCTION OF MILLER CUFF USING 6 MM X 80 CM PROPATEN  GRAFT ;   Surgeon: Conrad [place], MD;  Location: [facility];  Service: Vascular;  Laterality: Right;  . INGUINAL HERNIA REPAIR Bilateral 2000  . PERIPHERAL VASCULAR CATHETERIZATION N/A 11/26/2015   Procedure: Abdominal Aortogram;  Surgeon: [name], MD;  Location: [facility];  Service: Cardiovascular;  Laterality: N/A;  . VASECTOMY      There were no vitals filed for this visit.  Subjective Assessment - 11/22/18 1400    Subjective  No falls. He has not noticed any difference with height change.     Pertinent History  right TFA 12/26/2015, DM2, HTN, PVD/PAD    Patient Stated Goals  improve my gait especially inclines,     Currently in Pain?  No/denies                       [facility] Adult PT Treatment/Exercise - 11/22/18 1400      Ambulation/Gait   Ambulation/Gait  Yes    Ambulation/Gait Assistance  5: Supervision    Ambulation/Gait Assistance Details  tactile & verbal cues to decrease circumduction & abduction with prosthetic knee flexion including in turns to negotiate furniture    Ambulation Distance (Feet)  500 Feet   ambulated >64minutes working on skills   Assistive device  Prosthesis;None  Ambulation Surface  Indoor;Level    Stairs  Yes    Stairs Assistance  5: Supervision    Stairs Assistance Details (indicate cue type and reason)  worked on "riding" hydraulics of prosthetic knee descending with single rail with manual, demo & verbal cues.     Gait Comments  Treadmill working on gait at variable speeds from 1.24mph to 2.0pmh.  Worked on inclines 5%-7% at 1.43mph with tactile & verbal cues on decreasing abduction & circumduction with prosthetic knee flexion for swing.       High Level Balance   High Level Balance Activities  Turns;Negotitating around obstacles    High Level Balance Comments  tactile, demo & verbal cues      Prosthetics   Prosthetic Care Comments   --    Current prosthetic wear tolerance (days/week)   daily    Current prosthetic wear tolerance  (#hours/day)   all awake hours    Education Provided  --               PT Short Term Goals - 11/18/18 1304      PT SHORT TERM GOAL #1   Title  Patient negotiates furniture & change directions of movements with prosthetic knee engaged. (All updated STGs Target Date: 12/17/2018)    Time  1    Period  Months    Status  New    Target Date  12/17/18      PT SHORT TERM GOAL #2   Title  Patient verbalizes ability to ride stationary bicycle at home for 10 minutes.     Time  1    Period  Months    Status  New    Target Date  12/17/18      PT SHORT TERM GOAL #3   Title  Patient is able to change gait velocity with minimal increase of deviations.     Time  1    Period  Months    Status  New    Target Date  12/17/18        PT Long Term Goals - 10/26/18 1437      PT LONG TERM GOAL #1   Title  Patient verbalizes & demonstrates ongoing HEP / fitness plan including ability to ride bike (his goal) All LTGs Target Date: 01/14/2019    Time  3    Period  Months    Status  On-going    Target Date  01/14/19      PT LONG TERM GOAL #2   Title  Functional Gait Assessment >/= 23/ 30 to indicate lower fall risk with gait.     Time  3    Period  Months    Status  On-going    Target Date  01/14/19      PT LONG TERM GOAL #3   Title  Patient negotiates ramps with prosthetic knee control independently.     Time  3    Period  Months    Status  On-going    Target Date  01/14/19      PT LONG TERM GOAL #4   Title  Berg Balance >52/56 to indicate lower fall risk    Time  3    Period  Months    Status  On-going    Target Date  01/14/19      PT LONG TERM GOAL #5   Title  Patient able to increase gait velocity >1.0 ft/sec comfortable to fast pace.  Time  3    Period  Months    Status  On-going    Target Date  01/14/19            Plan - 11/22/18 1846    Clinical Impression Statement  Patient seems to understand how to use treadmill to work on gait at variable speeds. He  improved ability to engage prosthetic resistance to control descent / slow lowering body weight using stairs as training.     Rehab Potential  Good    PT Frequency  1x / week    PT Duration  12 weeks    PT Treatment/Interventions  ADLs/Self Care Home Management;Canalith Repostioning;DME Instruction;Gait training;Stair training;Functional mobility training;Therapeutic activities;Therapeutic exercise;Balance training;Neuromuscular re-education;Cognitive remediation;Patient/family education;Prosthetic Training;Vestibular    PT Next Visit Plan  work towards updated STGs.     Consulted and Agree with Plan of Care  Patient       Patient will benefit from skilled therapeutic intervention in order to improve the following deficits and impairments:  Abnormal gait, Decreased activity tolerance, Decreased balance, Decreased knowledge of use of DME, Dizziness, Impaired flexibility, Postural dysfunction, Prosthetic Dependency  Visit Diagnosis: Other abnormalities of gait and mobility  Unsteadiness on feet  Other symptoms and signs involving the musculoskeletal system  Abnormal posture     Problem List Patient Active Problem List   Diagnosis Date Noted  . Glucose intolerance 01/18/2016  . Phantom pain after amputation of lower extremity ([place]) 01/18/2016  . S/P AKA (above knee amputation), right ([place]) 12/29/2015  . PAD (peripheral artery disease) ([place]) 12/15/2015  . Hyperlipidemia 12/07/2015  . Hypertension 12/04/2014    Leon Goodnow PT, DPT 11/23/2018, 10:48 AM  [facility] [address] [place], [place], [address] Phone: [phone]   Fax:  [phone]  Name: Adam [name] MRN: [id number] Date of Birth: 07-Jul-1951

## 2018-11-29 ENCOUNTER — Encounter: Payer: Self-pay | Admitting: Physical Therapy

## 2018-11-29 ENCOUNTER — Ambulatory Visit: Payer: No Typology Code available for payment source | Admitting: Physical Therapy

## 2018-11-29 DIAGNOSIS — R2689 Other abnormalities of gait and mobility: Secondary | ICD-10-CM

## 2018-11-29 DIAGNOSIS — R293 Abnormal posture: Secondary | ICD-10-CM

## 2018-11-29 DIAGNOSIS — R29898 Other symptoms and signs involving the musculoskeletal system: Secondary | ICD-10-CM

## 2018-11-29 DIAGNOSIS — R2681 Unsteadiness on feet: Secondary | ICD-10-CM

## 2018-11-30 NOTE — Therapy (Signed)
[facility] [address] [place], [place], [address] Phone: [phone]   Fax:  [phone]  Physical Therapy Treatment  Patient Details  Name: Adam [name] MRN: [id number] Date of Birth: 1951-07-14 Referring Provider (PT): Jill Alexanders, MD   Encounter Date: 11/29/2018  PT End of Session - 11/29/18 1201    Visit Number  7    Number of Visits  13    Date for PT Re-Evaluation  01/14/19    Authorization Type  UHC    Authorization Time Period  [phone] deductible /oop; applied $587.36    VL: PT, OT, Sp, Chiro, pulmonary 30: zero used  Reset for 2020    Authorization - Visit Number  3    Authorization - Number of Visits  30    PT Start Time  1105    PT Stop Time  1150    PT Time Calculation (min)  45 min    Equipment Utilized During Treatment  Gait belt    Activity Tolerance  Patient tolerated treatment well    Behavior During Therapy  [facility] for tasks assessed/performed       Past Medical History:  Diagnosis Date  . Gangrene ([place]) 11/22/2015   right great toe  . Hypertension   . OSA on CPAP   . PAD (peripheral artery disease) ([place])   . Peripheral vascular disease ([place])   . Type II diabetes mellitus ([place]) dx'd 11/2015    Past Surgical History:  Procedure Laterality Date  . AMPUTATION Right 12/01/2015   Procedure: AMPUTATION RIGHT GREAT;  Surgeon: Conrad [place], MD;  Location: [facility];  Service: Vascular;  Laterality: Right;  . AMPUTATION Right 12/16/2015   Procedure: AMPUTATION BELOW KNEE;  Surgeon: [name], MD;  Location: [facility];  Service: Vascular;  Laterality: Right;  . AMPUTATION Right 12/26/2015   Procedure: RIGHT ABOVE KNEE AMPUTATION;  Surgeon: [name], MD;  Location: [facility];  Service: Vascular;  Laterality: Right;  . COLONOSCOPY    . FEMORAL-POPLITEAL BYPASS GRAFT Right 11/28/2015   Procedure: RIGHT  FEMORAL-POPLITEAL ARTERY BYPASS GRAFT AND CONSTRUCTION OF MILLER CUFF USING 6 MM X 80 CM PROPATEN  GRAFT ;   Surgeon: Conrad , MD;  Location: [facility];  Service: Vascular;  Laterality: Right;  . INGUINAL HERNIA REPAIR Bilateral 2000  . PERIPHERAL VASCULAR CATHETERIZATION N/A 11/26/2015   Procedure: Abdominal Aortogram;  Surgeon: [name], MD;  Location: [facility];  Service: Cardiovascular;  Laterality: N/A;  . VASECTOMY      There were no vitals filed for this visit.  Subjective Assessment - 11/29/18 1105    Subjective  May buy new shoes. No falls. He is working on increase prosthetic stance time & not abducting.     Pertinent History  right TFA 12/26/2015, DM2, HTN, PVD/PAD    Patient Stated Goals  improve my gait especially inclines,     Currently in Pain?  No/denies                       [facility] Adult PT Treatment/Exercise - 11/29/18 1100      Ambulation/Gait   Ambulation/Gait  Yes    Ambulation/Gait Assistance  5: Supervision    Ambulation/Gait Assistance Details  attempted LUE against lateral socket for pt to self tactile cues for abduction but did not work.  Used cones for visual reference to work on narrow stance including in gentle turns.  Ambulation Distance (Feet)  500 Feet   ambulated >25 minutes working on skills   Assistive device  Prosthesis;None    Ambulation Surface  Indoor;Level    Stairs  Yes    Stairs Assistance  5: Supervision    Stair Management Technique  Two rails;Alternating pattern;Forwards    Ramp  5: Supervision;4: Min assist   MinA / min guard when working on prosthetic knee flexion   Ramp Details (indicate cue type and reason)  demo, tactile & verbal cues on prosthetic knee flexion ascending and sound limb step through with prosthetic knee flexion descending    Gait Comments  Treadmill working on gait on inclines 5%-7% at 1.74mph with tactile & verbal cues on decreasing abduction & circumduction with prosthetic knee flexion for swing.  Worked on decreasing BUE support to single UE support only able to safely perform with  contralateral support.       High Level Balance   High Level Balance Activities  Turns;Negotitating around obstacles;Other (comment)    High Level Balance Comments  tactile, demo & verbal cues using cones for narrow stance      Prosthetics   Current prosthetic wear tolerance (days/week)   daily    Current prosthetic wear tolerance (#hours/day)   all awake hours             PT Education - 11/29/18 1130    Education Details  changing shoes on prosthesis and knee control    Person(s) Educated  Patient    Methods  Explanation;Demonstration;Verbal cues    Comprehension  Verbalized understanding       PT Short Term Goals - 11/18/18 1304      PT SHORT TERM GOAL #1   Title  Patient negotiates furniture & change directions of movements with prosthetic knee engaged. (All updated STGs Target Date: 12/17/2018)    Time  1    Period  Months    Status  New    Target Date  12/17/18      PT SHORT TERM GOAL #2   Title  Patient verbalizes ability to ride stationary bicycle at home for 10 minutes.     Time  1    Period  Months    Status  New    Target Date  12/17/18      PT SHORT TERM GOAL #3   Title  Patient is able to change gait velocity with minimal increase of deviations.     Time  1    Period  Months    Status  New    Target Date  12/17/18        PT Long Term Goals - 10/26/18 1437      PT LONG TERM GOAL #1   Title  Patient verbalizes & demonstrates ongoing HEP / fitness plan including ability to ride bike (his goal) All LTGs Target Date: 01/14/2019    Time  3    Period  Months    Status  On-going    Target Date  01/14/19      PT LONG TERM GOAL #2   Title  Functional Gait Assessment >/= 23/ 30 to indicate lower fall risk with gait.     Time  3    Period  Months    Status  On-going    Target Date  01/14/19      PT LONG TERM GOAL #3   Title  Patient negotiates ramps with prosthetic knee control independently.     Time  3  Period  Months    Status  On-going     Target Date  01/14/19      PT LONG TERM GOAL #4   Title  Berg Balance >52/56 to indicate lower fall risk    Time  3    Period  Months    Status  On-going    Target Date  01/14/19      PT LONG TERM GOAL #5   Title  Patient able to increase gait velocity >1.0 ft/sec comfortable to fast pace.      Time  3    Period  Months    Status  On-going    Target Date  01/14/19            Plan - 11/29/18 1215    Clinical Impression Statement  Patient improved prosthetic gait with cones as visual target for narrow stance. He verbalizes how to simulate with household items including set-up.     Rehab Potential  Good    PT Frequency  1x / week    PT Duration  12 weeks    PT Treatment/Interventions  ADLs/Self Care Home Management;Canalith Repostioning;DME Instruction;Gait training;Stair training;Functional mobility training;Therapeutic activities;Therapeutic exercise;Balance training;Neuromuscular re-education;Cognitive remediation;Patient/family education;Prosthetic Training;Vestibular    PT Next Visit Plan  work towards updated STGs.     Consulted and Agree with Plan of Care  Patient       Patient will benefit from skilled therapeutic intervention in order to improve the following deficits and impairments:  Abnormal gait, Decreased activity tolerance, Decreased balance, Decreased knowledge of use of DME, Dizziness, Impaired flexibility, Postural dysfunction, Prosthetic Dependency  Visit Diagnosis: Other abnormalities of gait and mobility  Unsteadiness on feet  Other symptoms and signs involving the musculoskeletal system  Abnormal posture     Problem List Patient Active Problem List   Diagnosis Date Noted  . Glucose intolerance 01/18/2016  . Phantom pain after amputation of lower extremity ([place]) 01/18/2016  . S/P AKA (above knee amputation), right ([place]) 12/29/2015  . PAD (peripheral artery disease) ([place]) 12/15/2015  . Hyperlipidemia 12/07/2015  . Hypertension 12/04/2014     Machael Raine PT, DPT 11/30/2018, 6:48 AM  [facility] [address] [place], [place], [address] Phone: [phone]   Fax:  [phone]  Name: SADLER TESCHNER MRN: [id number] Date of Birth: 07-13-1951

## 2018-12-06 ENCOUNTER — Encounter: Payer: Self-pay | Admitting: Physical Therapy

## 2018-12-06 ENCOUNTER — Ambulatory Visit: Payer: No Typology Code available for payment source | Admitting: Physical Therapy

## 2018-12-06 DIAGNOSIS — R293 Abnormal posture: Secondary | ICD-10-CM

## 2018-12-06 DIAGNOSIS — R2689 Other abnormalities of gait and mobility: Secondary | ICD-10-CM

## 2018-12-06 DIAGNOSIS — R2681 Unsteadiness on feet: Secondary | ICD-10-CM

## 2018-12-06 DIAGNOSIS — R29898 Other symptoms and signs involving the musculoskeletal system: Secondary | ICD-10-CM

## 2018-12-06 NOTE — Therapy (Signed)
[facility] [address] [place], [place], [address] Phone: [phone]   Fax:  [phone]  Physical Therapy Treatment  Patient Details  Name: Adam [name] MRN: [id number] Date of Birth: 1951/01/13 Referring Provider (PT): Jill Alexanders, MD   Encounter Date: 12/06/2018  PT End of Session - 12/06/18 1148    Visit Number  8    Number of Visits  13    Date for PT Re-Evaluation  01/14/19    Authorization Type  U[employer]    Authorization Time Period  [phone] deductible /oop; applied $587.36    VL: PT, OT, Sp, Chiro, pulmonary 30: zero used  Reset for 2020    Authorization - Visit Number  4    Authorization - Number of Visits  30    PT Start Time  1018    PT Stop Time  1100    PT Time Calculation (min)  42 min    Equipment Utilized During Treatment  Gait belt    Activity Tolerance  Patient tolerated treatment well    Behavior During Therapy  [facility] for tasks assessed/performed       Past Medical History:  Diagnosis Date  . Gangrene ([place]) 11/22/2015   right great toe  . Hypertension   . OSA on CPAP   . PAD (peripheral artery disease) ([place])   . Peripheral vascular disease ([place])   . Type II diabetes mellitus ([place]) dx'd 11/2015    Past Surgical History:  Procedure Laterality Date  . AMPUTATION Right 12/01/2015   Procedure: AMPUTATION RIGHT GREAT;  Surgeon: Conrad [place], MD;  Location: [facility];  Service: Vascular;  Laterality: Right;  . AMPUTATION Right 12/16/2015   Procedure: AMPUTATION BELOW KNEE;  Surgeon: [name], MD;  Location: [facility];  Service: Vascular;  Laterality: Right;  . AMPUTATION Right 12/26/2015   Procedure: RIGHT ABOVE KNEE AMPUTATION;  Surgeon: [name], MD;  Location: [facility];  Service: Vascular;  Laterality: Right;  . COLONOSCOPY    . FEMORAL-POPLITEAL BYPASS GRAFT Right 11/28/2015   Procedure: RIGHT  FEMORAL-POPLITEAL ARTERY BYPASS GRAFT AND CONSTRUCTION OF MILLER CUFF USING 6 MM X 80 CM PROPATEN  GRAFT ;   Surgeon: Conrad [place], MD;  Location: [facility];  Service: Vascular;  Laterality: Right;  . INGUINAL HERNIA REPAIR Bilateral 2000  . PERIPHERAL VASCULAR CATHETERIZATION N/A 11/26/2015   Procedure: Abdominal Aortogram;  Surgeon: [name], MD;  Location: [facility];  Service: Cardiovascular;  Laterality: N/A;  . VASECTOMY      There were no vitals filed for this visit.  Subjective Assessment - 12/06/18 1015    Subjective  No falls. He has been working on stepping thru on inclined driveway.     Pertinent History  right TFA 12/26/2015, DM2, HTN, PVD/PAD    Patient Stated Goals  improve my gait especially inclines,     Currently in Pain?  No/denies        Prosthetic Training Treadmill 1.72mph, 1.7, 1.9, 2.59mph 2 minutes ea with tactile & verbal cues on not circumducting and let prosthesis go back into terminal stance with upright posture.  PT instructed on benefits to using treadmill to walk at various speeds & focus on gait mechanics. Pt ambulated between 2 cones 18" apart placed every 5' to decrease abduction. Demo & verbal cues on not vaulting to advance prosthesis & RUE arm swing.  Pt return demo / ambulated multiple reps of gait with PT verbal cues to  improve gait deviations.                          PT Short Term Goals - 11/18/18 1304      PT SHORT TERM GOAL #1   Title  Patient negotiates furniture & change directions of movements with prosthetic knee engaged. (All updated STGs Target Date: 12/17/2018)    Time  1    Period  Months    Status  New    Target Date  12/17/18      PT SHORT TERM GOAL #2   Title  Patient verbalizes ability to ride stationary bicycle at home for 10 minutes.     Time  1    Period  Months    Status  New    Target Date  12/17/18      PT SHORT TERM GOAL #3   Title  Patient is able to change gait velocity with minimal increase of deviations.     Time  1    Period  Months    Status  New    Target Date  12/17/18         PT Long Term Goals - 10/26/18 1437      PT LONG TERM GOAL #1   Title  Patient verbalizes & demonstrates ongoing HEP / fitness plan including ability to ride bike (his goal) All LTGs Target Date: 01/14/2019    Time  3    Period  Months    Status  On-going    Target Date  01/14/19      PT LONG TERM GOAL #2   Title  Functional Gait Assessment >/= 23/ 30 to indicate lower fall risk with gait.     Time  3    Period  Months    Status  On-going    Target Date  01/14/19      PT LONG TERM GOAL #3   Title  Patient negotiates ramps with prosthetic knee control independently.     Time  3    Period  Months    Status  On-going    Target Date  01/14/19      PT LONG TERM GOAL #4   Title  Berg Balance >52/56 to indicate lower fall risk    Time  3    Period  Months    Status  On-going    Target Date  01/14/19      PT LONG TERM GOAL #5   Title  Patient able to increase gait velocity >1.0 ft/sec comfortable to fast pace.      Time  3    Period  Months    Status  On-going    Target Date  01/14/19            Plan - 12/06/18 1226    Clinical Impression Statement  Today's skilled session focused on prosthetic gait at various speeds on treadmill and overground without abducting, not vaulting & RUE arm swing. Pt verbalized better understanding at end of session.     Rehab Potential  Good    PT Frequency  1x / week    PT Duration  12 weeks    PT Treatment/Interventions  ADLs/Self Care Home Management;Canalith Repostioning;DME Instruction;Gait training;Stair training;Functional mobility training;Therapeutic activities;Therapeutic exercise;Balance training;Neuromuscular re-education;Cognitive remediation;Patient/family education;Prosthetic Training;Vestibular    PT Next Visit Plan  pt out of town on cruise next week. check STGs next session, begin working towards [employer]  and Agree with Plan of Care  Patient       Patient will benefit from skilled therapeutic intervention in  order to improve the following deficits and impairments:  Abnormal gait, Decreased activity tolerance, Decreased balance, Decreased knowledge of use of DME, Dizziness, Impaired flexibility, Postural dysfunction, Prosthetic Dependency  Visit Diagnosis: Other abnormalities of gait and mobility  Unsteadiness on feet  Other symptoms and signs involving the musculoskeletal system  Abnormal posture     Problem List Patient Active Problem List   Diagnosis Date Noted  . Glucose intolerance 01/18/2016  . Phantom pain after amputation of lower extremity ([place]) 01/18/2016  . S/P AKA (above knee amputation), right ([place]) 12/29/2015  . PAD (peripheral artery disease) ([place]) 12/15/2015  . Hyperlipidemia 12/07/2015  . Hypertension 12/04/2014    Jamey Reas PT, DPT 12/06/2018, 3:15 PM  [facility] [address] [place], [place], [address] Phone: [phone]   Fax:  [phone]  Name: Adam [name] MRN: [id number] Date of Birth: Aug 24, 1951

## 2018-12-13 ENCOUNTER — Encounter: Payer: No Typology Code available for payment source | Admitting: Physical Therapy

## 2018-12-20 ENCOUNTER — Encounter: Payer: No Typology Code available for payment source | Admitting: Physical Therapy

## 2018-12-27 ENCOUNTER — Encounter: Payer: No Typology Code available for payment source | Admitting: Physical Therapy

## 2018-12-28 ENCOUNTER — Other Ambulatory Visit: Payer: Self-pay | Admitting: Family Medicine

## 2018-12-28 DIAGNOSIS — E785 Hyperlipidemia, unspecified: Principal | ICD-10-CM

## 2018-12-28 DIAGNOSIS — E1169 Type 2 diabetes mellitus with other specified complication: Secondary | ICD-10-CM

## 2019-01-03 ENCOUNTER — Encounter: Payer: No Typology Code available for payment source | Admitting: Physical Therapy

## 2019-01-10 ENCOUNTER — Encounter: Payer: No Typology Code available for payment source | Admitting: Physical Therapy

## 2019-02-07 ENCOUNTER — Encounter: Payer: Self-pay | Admitting: Physical Therapy

## 2019-02-07 NOTE — Therapy (Signed)
[facility] [address] [place], [place], [address] Phone: [phone]   Fax:  [phone]  Patient Details  Name: Adam [name] MRN: [id number] Date of Birth: 05/21/1951 Referring Provider:  Jill Alexanders, MD  Encounter Date: 02/07/2019  PHYSICAL THERAPY DISCHARGE SUMMARY  Visits from Start of Care: 8  Current functional level related to goals / functional outcomes: See last PT note on 12/06/2018   Remaining deficits: See last PT note on 12/06/2018   Education / Equipment: HEP  Plan: Patient agrees to discharge.  Patient goals were not met. Patient is being discharged due to the patient's request.  ?????         Lelani Garnett PT, DPT 02/07/2019, 2:06 PM  [facility] [address] [place], [place], [address] Phone: [phone]   Fax:  [phone]

## 2019-02-21 ENCOUNTER — Other Ambulatory Visit: Payer: Self-pay | Admitting: Family Medicine

## 2019-02-21 DIAGNOSIS — I10 Essential (primary) hypertension: Secondary | ICD-10-CM

## 2019-03-10 ENCOUNTER — Other Ambulatory Visit: Payer: Self-pay | Admitting: Family Medicine

## 2019-03-10 DIAGNOSIS — I10 Essential (primary) hypertension: Secondary | ICD-10-CM

## 2019-05-16 ENCOUNTER — Encounter: Payer: PRIVATE HEALTH INSURANCE | Admitting: Family Medicine

## 2019-05-16 ENCOUNTER — Telehealth: Payer: Self-pay

## 2019-05-16 NOTE — Telephone Encounter (Signed)
Called pt to scheduled cpe appointment due to it being canceled last week. [facility]

## 2019-05-25 ENCOUNTER — Other Ambulatory Visit: Payer: Self-pay | Admitting: Family Medicine

## 2019-05-25 DIAGNOSIS — E1169 Type 2 diabetes mellitus with other specified complication: Secondary | ICD-10-CM

## 2019-05-25 DIAGNOSIS — E785 Hyperlipidemia, unspecified: Secondary | ICD-10-CM

## 2019-05-27 ENCOUNTER — Other Ambulatory Visit: Payer: Self-pay | Admitting: Family Medicine

## 2019-05-27 DIAGNOSIS — E1169 Type 2 diabetes mellitus with other specified complication: Secondary | ICD-10-CM

## 2019-05-27 DIAGNOSIS — E785 Hyperlipidemia, unspecified: Secondary | ICD-10-CM

## 2019-06-20 ENCOUNTER — Other Ambulatory Visit: Payer: Self-pay

## 2019-06-20 ENCOUNTER — Ambulatory Visit (INDEPENDENT_AMBULATORY_CARE_PROVIDER_SITE_OTHER): Payer: PRIVATE HEALTH INSURANCE | Admitting: Family Medicine

## 2019-06-20 ENCOUNTER — Encounter: Payer: Self-pay | Admitting: Family Medicine

## 2019-06-20 VITALS — BP 156/96 | HR 66 | Temp 98.2°F | Ht 70.0 in | Wt 184.8 lb

## 2019-06-20 DIAGNOSIS — E785 Hyperlipidemia, unspecified: Secondary | ICD-10-CM | POA: Diagnosis not present

## 2019-06-20 DIAGNOSIS — Z89611 Acquired absence of right leg above knee: Secondary | ICD-10-CM | POA: Diagnosis not present

## 2019-06-20 DIAGNOSIS — I739 Peripheral vascular disease, unspecified: Secondary | ICD-10-CM | POA: Diagnosis not present

## 2019-06-20 DIAGNOSIS — Z0000 Encounter for general adult medical examination without abnormal findings: Secondary | ICD-10-CM | POA: Diagnosis not present

## 2019-06-20 DIAGNOSIS — I10 Essential (primary) hypertension: Secondary | ICD-10-CM

## 2019-06-20 DIAGNOSIS — E1169 Type 2 diabetes mellitus with other specified complication: Secondary | ICD-10-CM

## 2019-06-20 MED ORDER — AMLODIPINE BESYLATE 10 MG PO TABS: 10.0000 mg | ORAL_TABLET | Freq: Every day | ORAL | 3 refills | Status: DC

## 2019-06-20 MED ORDER — METOPROLOL SUCCINATE ER 50 MG PO TB24: 50.0000 mg | ORAL_TABLET | Freq: Every day | ORAL | 1 refills | Status: DC

## 2019-06-20 MED ORDER — LOSARTAN POTASSIUM-HCTZ 100-12.5 MG PO TABS: 1.0000 | ORAL_TABLET | Freq: Every day | ORAL | 3 refills | Status: DC

## 2019-06-20 MED ORDER — ATORVASTATIN CALCIUM 10 MG PO TABS: 10.0000 mg | ORAL_TABLET | Freq: Every day | ORAL | 1 refills | Status: DC

## 2019-06-20 NOTE — Progress Notes (Signed)
   Subjective:    Patient ID: Adam [name], male    DOB: February 19, 1951, 68 y.o.   MRN: [id number]  HPI He is here for a recheck.  He has no particular concerns or complaints.  He continues on atorvastatin and is having no difficulty with that.  He is also taking amlodipine and losartan.  He has not had follow-up vascular evaluation in quite some time.  He has had no difficulty with leg pain with activity, skin changes etc.  He is getting along well with his prosthesis and does occasionally get this readjusted.  He continues to work.  Life in general is going well for him.  He does not smoke and does drink 1 or 2 beverages per day.  Otherwise his family and social history as well as health maintenance immunizations was reviewed.   Review of Systems  All other systems reviewed and are negative.      Objective:   Physical Exam Alert and in no distress. Tympanic membranes and canals are normal. Pharyngeal area is normal. Neck is supple without adenopathy or thyromegaly. Cardiac exam shows a regular sinus rhythm without murmurs or gallops. Lungs are clear to auscultation.  Absence of right leg with prosthesis is noted.  Left leg appears normal.        Assessment & Plan:  Routine general medical examination at a health care facility -  PAD (peripheral artery disease) ([place]) - Plan: VAS [place] LE ART SEG MULTI (Segm&LE Reynauds) S/P AKA (above knee amputation), right ([place]) - \  Essential hypertension - Plan: losartan-hydrochlorothiazide (HYZAAR) 100-12.5 MG tablet, amLODipine (NORVASC) 10 MG tablet, CBC with Differential/Platelet, Comprehensive metabolic panel, metoprolol succinate (TOPROL-XL) 50 MG 24 hr tablet,  His blood pressure is not under adequate control.  I will therefore add Toprol and recheck here in 1 month.  Hyperlipidemia, unspecified hyperlipidemia type - Plan: Lipid panel,

## 2019-06-21 LAB — CBC WITH DIFFERENTIAL/PLATELET
Basophils Absolute: 0.1 10*3/uL (ref 0.0–0.2)
Basos: 1 %
EOS (ABSOLUTE): 0.1 10*3/uL (ref 0.0–0.4)
Eos: 2 %
Hematocrit: 41.8 % (ref 37.5–51.0)
Hemoglobin: 14.7 g/dL (ref 13.0–17.7)
Immature Grans (Abs): 0 10*3/uL (ref 0.0–0.1)
Immature Granulocytes: 0 %
Lymphocytes Absolute: 1.5 10*3/uL (ref 0.7–3.1)
Lymphs: 23 %
MCH: 32.2 pg (ref 26.6–33.0)
MCHC: 35.2 g/dL (ref 31.5–35.7)
MCV: 92 fL (ref 79–97)
Monocytes Absolute: 0.6 10*3/uL (ref 0.1–0.9)
Monocytes: 10 %
Neutrophils Absolute: 4.1 10*3/uL (ref 1.4–7.0)
Neutrophils: 64 %
Platelets: 309 10*3/uL (ref 150–450)
RBC: 4.56 x10E6/uL (ref 4.14–5.80)
RDW: 12.8 % (ref 11.6–15.4)
WBC: 6.4 10*3/uL (ref 3.4–10.8)

## 2019-06-21 LAB — COMPREHENSIVE METABOLIC PANEL
ALT: 40 IU/L (ref 0–44)
AST: 31 IU/L (ref 0–40)
Albumin/Globulin Ratio: 2.4 — ABNORMAL HIGH (ref 1.2–2.2)
Albumin: 4.7 g/dL (ref 3.8–4.8)
Alkaline Phosphatase: 46 IU/L (ref 39–117)
BUN/Creatinine Ratio: 20 (ref 10–24)
BUN: 14 mg/dL (ref 8–27)
Bilirubin Total: 0.6 mg/dL (ref 0.0–1.2)
CO2: 22 mmol/L (ref 20–29)
Calcium: 9.5 mg/dL (ref 8.6–10.2)
Chloride: 105 mmol/L (ref 96–106)
Creatinine, Ser: 0.69 mg/dL — ABNORMAL LOW (ref 0.76–1.27)
GFR calc Af Amer: 113 mL/min/{1.73_m2} (ref >59)
GFR calc non Af Amer: 98 mL/min/{1.73_m2} (ref >59)
Globulin, Total: 2 g/dL (ref 1.5–4.5)
Glucose: 106 mg/dL — ABNORMAL HIGH (ref 65–99)
Potassium: 4.1 mmol/L (ref 3.5–5.2)
Sodium: 145 mmol/L — ABNORMAL HIGH (ref 134–144)
Total Protein: 6.7 g/dL (ref 6.0–8.5)

## 2019-06-21 LAB — LIPID PANEL
Chol/HDL Ratio: 2.5 ratio (ref 0.0–5.0)
Cholesterol, Total: 130 mg/dL (ref 100–199)
HDL: 52 mg/dL (ref >39)
LDL Calculated: 60 mg/dL (ref 0–99)
Triglycerides: 90 mg/dL (ref 0–149)
VLDL Cholesterol Cal: 18 mg/dL (ref 5–40)

## 2019-07-14 ENCOUNTER — Other Ambulatory Visit: Payer: Self-pay | Admitting: Family Medicine

## 2019-07-14 DIAGNOSIS — I10 Essential (primary) hypertension: Secondary | ICD-10-CM

## 2019-08-01 ENCOUNTER — Ambulatory Visit (INDEPENDENT_AMBULATORY_CARE_PROVIDER_SITE_OTHER): Payer: PRIVATE HEALTH INSURANCE | Admitting: Family Medicine

## 2019-08-01 ENCOUNTER — Other Ambulatory Visit: Payer: Self-pay

## 2019-08-01 ENCOUNTER — Encounter: Payer: Self-pay | Admitting: Family Medicine

## 2019-08-01 VITALS — BP 148/90 | HR 85 | Temp 98.2°F | Wt 183.4 lb

## 2019-08-01 DIAGNOSIS — Z23 Encounter for immunization: Secondary | ICD-10-CM

## 2019-08-01 DIAGNOSIS — I10 Essential (primary) hypertension: Secondary | ICD-10-CM

## 2019-08-01 DIAGNOSIS — K429 Umbilical hernia without obstruction or gangrene: Secondary | ICD-10-CM | POA: Diagnosis not present

## 2019-08-01 NOTE — Progress Notes (Signed)
   Subjective:    Patient ID: Adam [name], male    DOB: October 21, 1951, 68 y.o.   MRN: [id number]  HPI He is here for recheck on his blood pressure.  He did bring his cuff in however there were not fresh batteries.  He presently is taking Hyzaar, Norvasc and Toprol. He also is concerned about a possible hernia.  Review of Systems     Objective:   Physical Exam Alert and in no distress his blood pressure cuff is good and good readings. Abdominal exam shows a very small umbilical hernia.    Assessment & Plan:  Essential hypertension  Need for influenza vaccination - Plan: Flu Vaccine QUAD High Dose(Fluad)  Umbilical hernia without obstruction and without gangrene He was instructed on proper technique for checking his blood pressure.  He is to check it once a week and call me in 1 month.  We will readjust based on his readings.  I discussed the hernia with him in detail.  Since he is having no difficulty no further intervention is needed.  If he starts having more abdominal pain, nausea or vomiting he will let me know.

## 2019-12-25 ENCOUNTER — Other Ambulatory Visit: Payer: Self-pay | Admitting: Family Medicine

## 2019-12-25 DIAGNOSIS — E785 Hyperlipidemia, unspecified: Secondary | ICD-10-CM

## 2019-12-25 DIAGNOSIS — E1169 Type 2 diabetes mellitus with other specified complication: Secondary | ICD-10-CM

## 2019-12-25 DIAGNOSIS — I10 Essential (primary) hypertension: Secondary | ICD-10-CM

## 2020-03-25 ENCOUNTER — Other Ambulatory Visit: Payer: Self-pay | Admitting: Family Medicine

## 2020-03-25 DIAGNOSIS — E1169 Type 2 diabetes mellitus with other specified complication: Secondary | ICD-10-CM

## 2020-03-25 DIAGNOSIS — I10 Essential (primary) hypertension: Secondary | ICD-10-CM

## 2020-06-20 ENCOUNTER — Other Ambulatory Visit: Payer: Self-pay | Admitting: Family Medicine

## 2020-06-20 DIAGNOSIS — E1169 Type 2 diabetes mellitus with other specified complication: Secondary | ICD-10-CM

## 2020-06-20 DIAGNOSIS — I10 Essential (primary) hypertension: Secondary | ICD-10-CM

## 2020-07-01 ENCOUNTER — Other Ambulatory Visit: Payer: Self-pay | Admitting: Family Medicine

## 2020-07-01 DIAGNOSIS — I10 Essential (primary) hypertension: Secondary | ICD-10-CM

## 2020-07-06 ENCOUNTER — Ambulatory Visit (INDEPENDENT_AMBULATORY_CARE_PROVIDER_SITE_OTHER): Payer: No Typology Code available for payment source | Admitting: Family Medicine

## 2020-07-06 ENCOUNTER — Other Ambulatory Visit: Payer: Self-pay

## 2020-07-06 ENCOUNTER — Encounter: Payer: Self-pay | Admitting: Family Medicine

## 2020-07-06 VITALS — BP 184/100 | HR 61 | Temp 97.7°F | Ht 70.0 in | Wt 183.4 lb

## 2020-07-06 DIAGNOSIS — I10 Essential (primary) hypertension: Secondary | ICD-10-CM | POA: Diagnosis not present

## 2020-07-06 DIAGNOSIS — Z89611 Acquired absence of right leg above knee: Secondary | ICD-10-CM

## 2020-07-06 DIAGNOSIS — I739 Peripheral vascular disease, unspecified: Secondary | ICD-10-CM

## 2020-07-06 DIAGNOSIS — E785 Hyperlipidemia, unspecified: Secondary | ICD-10-CM

## 2020-07-06 DIAGNOSIS — N5201 Erectile dysfunction due to arterial insufficiency: Secondary | ICD-10-CM | POA: Insufficient documentation

## 2020-07-06 MED ORDER — METOPROLOL SUCCINATE ER 50 MG PO TB24: ORAL_TABLET | ORAL | 0 refills | Status: DC

## 2020-07-06 MED ORDER — ATORVASTATIN CALCIUM 10 MG PO TABS: 10.0000 mg | ORAL_TABLET | Freq: Every day | ORAL | 0 refills | Status: DC

## 2020-07-06 MED ORDER — SILDENAFIL CITRATE 20 MG PO TABS: ORAL_TABLET | ORAL | 5 refills | Status: DC

## 2020-07-06 MED ORDER — AMLODIPINE BESYLATE 10 MG PO TABS: 10.0000 mg | ORAL_TABLET | Freq: Every day | ORAL | 3 refills | Status: DC

## 2020-07-06 NOTE — Progress Notes (Signed)
   Subjective:    Patient ID: Adam [name], male    DOB: 10-28-1951, 69 y.o.   MRN: [id number]  HPI He is here for a recheck.  He did run out of amlodipine but did not call us to get it squared away.  Apparently it was sent to the wrong drugstore.  He continues on losartan as well as metoprolol.  He is having no difficulty with them.  Continues on Lipitor without difficulty.  He is about to go in and have his prosthetic right leg reevaluated.  Apparently it is time for a tune up on it.  He is having no difficulty with it.  He is now essentially living at [facility] and enjoying that.  His marriage is going well.  He would like some sildenafil as he has noted some slight difficulty with erectile dysfunction.  He has no other particular concerns or complaints.   Review of Systems     Objective:   Physical Exam Alert and in no distress. Tympanic membranes and canals are normal. Pharyngeal area is normal. Neck is supple without adenopathy or thyromegaly. Cardiac exam shows a regular sinus rhythm without murmurs or gallops. Lungs are clear to auscultation.  Right leg prosthesis is noted        Assessment & Plan:  PAD (peripheral artery disease) ([place])  Essential hypertension - Plan: Comprehensive metabolic panel, CBC with Differential/Platelet, amLODipine (NORVASC) 10 MG tablet, metoprolol succinate (TOPROL-XL) 50 MG 24 hr tablet  S/P AKA (above knee amputation), right (H[facility])  Hyperlipidemia, unspecified hyperlipidemia type - Plan: Lipid panel, atorvastatin (LIPITOR) 10 MG tablet  Erectile dysfunction due to arterial insufficiency - Plan: sildenafil (REVATIO) 20 MG tablet Discussed proper use of sildenafil.  I will also renew his medications.  He is to get a blood pressure cuff and make sure he calls [place] back with the readings as today's was higher than it should be.  He was comfortable with that.  Approximately 30 minutes spent discussing all these issues with him

## 2020-07-07 LAB — CBC WITH DIFFERENTIAL/PLATELET
Basophils Absolute: 0.1 10*3/uL (ref 0.0–0.2)
Basos: 1 %
EOS (ABSOLUTE): 0.1 10*3/uL (ref 0.0–0.4)
Eos: 2 %
Hematocrit: 42.1 % (ref 37.5–51.0)
Hemoglobin: 14.9 g/dL (ref 13.0–17.7)
Immature Grans (Abs): 0 10*3/uL (ref 0.0–0.1)
Immature Granulocytes: 0 %
Lymphocytes Absolute: 1.8 10*3/uL (ref 0.7–3.1)
Lymphs: 30 %
MCH: 32.6 pg (ref 26.6–33.0)
MCHC: 35.4 g/dL (ref 31.5–35.7)
MCV: 92 fL (ref 79–97)
Monocytes Absolute: 0.5 10*3/uL (ref 0.1–0.9)
Monocytes: 9 %
Neutrophils Absolute: 3.5 10*3/uL (ref 1.4–7.0)
Neutrophils: 58 %
Platelets: 283 10*3/uL (ref 150–450)
RBC: 4.57 x10E6/uL (ref 4.14–5.80)
RDW: 12.9 % (ref 11.6–15.4)
WBC: 5.9 10*3/uL (ref 3.4–10.8)

## 2020-07-07 LAB — COMPREHENSIVE METABOLIC PANEL
ALT: 32 IU/L (ref 0–44)
AST: 26 IU/L (ref 0–40)
Albumin/Globulin Ratio: 2 (ref 1.2–2.2)
Albumin: 5 g/dL — ABNORMAL HIGH (ref 3.8–4.8)
Alkaline Phosphatase: 49 IU/L (ref 48–121)
BUN/Creatinine Ratio: 19 (ref 10–24)
BUN: 13 mg/dL (ref 8–27)
Bilirubin Total: 0.6 mg/dL (ref 0.0–1.2)
CO2: 23 mmol/L (ref 20–29)
Calcium: 9.9 mg/dL (ref 8.6–10.2)
Chloride: 102 mmol/L (ref 96–106)
Creatinine, Ser: 0.7 mg/dL — ABNORMAL LOW (ref 0.76–1.27)
GFR calc Af Amer: 111 mL/min/{1.73_m2} (ref >59)
GFR calc non Af Amer: 96 mL/min/{1.73_m2} (ref >59)
Globulin, Total: 2.5 g/dL (ref 1.5–4.5)
Glucose: 91 mg/dL (ref 65–99)
Potassium: 4.4 mmol/L (ref 3.5–5.2)
Sodium: 141 mmol/L (ref 134–144)
Total Protein: 7.5 g/dL (ref 6.0–8.5)

## 2020-07-07 LAB — LIPID PANEL
Chol/HDL Ratio: 2.6 ratio (ref 0.0–5.0)
Cholesterol, Total: 140 mg/dL (ref 100–199)
HDL: 53 mg/dL (ref >39)
LDL Chol Calc (NIH): 71 mg/dL (ref 0–99)
Triglycerides: 85 mg/dL (ref 0–149)
VLDL Cholesterol Cal: 16 mg/dL (ref 5–40)

## 2020-10-24 ENCOUNTER — Telehealth: Payer: Self-pay | Admitting: Family Medicine

## 2020-10-24 NOTE — Telephone Encounter (Signed)
Done and community message was sent. [place]

## 2020-10-24 NOTE — Telephone Encounter (Signed)
Pt needs a new prescription for his cpap machine it is no longer working. He uses [facility] and their fax number is [phone]

## 2020-10-24 NOTE — Telephone Encounter (Signed)
Done

## 2020-10-30 ENCOUNTER — Telehealth: Payer: Self-pay | Admitting: Family Medicine

## 2020-10-30 NOTE — Telephone Encounter (Signed)
Pt called and said the company did not receive the prescription and wants to see if you can resend it to the same number

## 2020-10-30 NOTE — Telephone Encounter (Signed)
Could not reach pt sent SMS message and will send my chart message. [place]

## 2020-10-31 ENCOUNTER — Telehealth: Payer: Self-pay | Admitting: Family Medicine

## 2020-10-31 ENCOUNTER — Telehealth: Payer: Self-pay

## 2020-10-31 NOTE — Telephone Encounter (Signed)
Diane from [facility] called and they need the settings for his cpap machine it can be faxed to [phone]

## 2020-10-31 NOTE — Telephone Encounter (Signed)
Spoke to pt and advised that we will resend order to [facility] in [place] . [place]

## 2020-10-31 NOTE — Telephone Encounter (Signed)
Whatever he has on the CPAP now

## 2020-10-31 NOTE — Telephone Encounter (Signed)
Spoke to pt and resent the order to local [facility]. [place]

## 2020-11-01 NOTE — Telephone Encounter (Signed)
Done KH 

## 2020-11-29 ENCOUNTER — Other Ambulatory Visit: Payer: Self-pay | Admitting: Family Medicine

## 2020-11-29 DIAGNOSIS — E785 Hyperlipidemia, unspecified: Secondary | ICD-10-CM

## 2020-11-30 ENCOUNTER — Encounter: Payer: Self-pay | Admitting: Family Medicine

## 2020-12-10 ENCOUNTER — Telehealth: Payer: Self-pay | Admitting: Family Medicine

## 2020-12-24 ENCOUNTER — Ambulatory Visit: Payer: No Typology Code available for payment source | Admitting: Family Medicine

## 2021-01-22 ENCOUNTER — Other Ambulatory Visit: Payer: Self-pay | Admitting: Family Medicine

## 2021-01-22 DIAGNOSIS — I10 Essential (primary) hypertension: Secondary | ICD-10-CM

## 2021-01-22 DIAGNOSIS — E785 Hyperlipidemia, unspecified: Secondary | ICD-10-CM

## 2021-01-30 DIAGNOSIS — G4733 Obstructive sleep apnea (adult) (pediatric): Secondary | ICD-10-CM | POA: Diagnosis not present

## 2021-01-31 ENCOUNTER — Telehealth: Payer: Self-pay

## 2021-01-31 NOTE — Telephone Encounter (Signed)
Disregard previous message. Pt supplied postage to mail back a  Copy is made for pt chart. [place]

## 2021-01-31 NOTE — Telephone Encounter (Signed)
Called pt to advise of parking placard application is completed and ready for pick up. No answer LVM . Form in pick up folder.   [place]

## 2021-02-19 ENCOUNTER — Encounter: Payer: Self-pay | Admitting: Family Medicine

## 2021-03-02 DIAGNOSIS — G4733 Obstructive sleep apnea (adult) (pediatric): Secondary | ICD-10-CM | POA: Diagnosis not present

## 2021-04-01 DIAGNOSIS — G4733 Obstructive sleep apnea (adult) (pediatric): Secondary | ICD-10-CM | POA: Diagnosis not present

## 2021-05-02 DIAGNOSIS — G4733 Obstructive sleep apnea (adult) (pediatric): Secondary | ICD-10-CM | POA: Diagnosis not present

## 2021-05-31 ENCOUNTER — Other Ambulatory Visit: Payer: Self-pay | Admitting: Family Medicine

## 2021-05-31 DIAGNOSIS — I10 Essential (primary) hypertension: Secondary | ICD-10-CM

## 2021-06-01 DIAGNOSIS — G4733 Obstructive sleep apnea (adult) (pediatric): Secondary | ICD-10-CM | POA: Diagnosis not present

## 2021-07-02 DIAGNOSIS — G4733 Obstructive sleep apnea (adult) (pediatric): Secondary | ICD-10-CM | POA: Diagnosis not present

## 2021-07-23 ENCOUNTER — Other Ambulatory Visit: Payer: Self-pay

## 2021-07-23 ENCOUNTER — Encounter: Payer: Self-pay | Admitting: Family Medicine

## 2021-07-23 ENCOUNTER — Ambulatory Visit (INDEPENDENT_AMBULATORY_CARE_PROVIDER_SITE_OTHER): Payer: Medicare HMO | Admitting: Family Medicine

## 2021-07-23 VITALS — BP 148/88 | HR 74 | Temp 97.3°F | Ht 69.75 in | Wt 178.2 lb

## 2021-07-23 DIAGNOSIS — N5201 Erectile dysfunction due to arterial insufficiency: Secondary | ICD-10-CM | POA: Diagnosis not present

## 2021-07-23 DIAGNOSIS — R011 Cardiac murmur, unspecified: Secondary | ICD-10-CM | POA: Diagnosis not present

## 2021-07-23 DIAGNOSIS — I739 Peripheral vascular disease, unspecified: Secondary | ICD-10-CM

## 2021-07-23 DIAGNOSIS — Z0000 Encounter for general adult medical examination without abnormal findings: Secondary | ICD-10-CM | POA: Diagnosis not present

## 2021-07-23 DIAGNOSIS — I10 Essential (primary) hypertension: Secondary | ICD-10-CM | POA: Diagnosis not present

## 2021-07-23 DIAGNOSIS — E785 Hyperlipidemia, unspecified: Secondary | ICD-10-CM | POA: Diagnosis not present

## 2021-07-23 DIAGNOSIS — Z89611 Acquired absence of right leg above knee: Secondary | ICD-10-CM

## 2021-07-23 DIAGNOSIS — Z23 Encounter for immunization: Secondary | ICD-10-CM

## 2021-07-23 DIAGNOSIS — G546 Phantom limb syndrome with pain: Secondary | ICD-10-CM

## 2021-07-23 MED ORDER — SILDENAFIL CITRATE 20 MG PO TABS: ORAL_TABLET | ORAL | 5 refills | Status: DC

## 2021-07-23 MED ORDER — LOSARTAN POTASSIUM-HCTZ 100-12.5 MG PO TABS: 1.0000 | ORAL_TABLET | Freq: Every day | ORAL | 3 refills | Status: DC

## 2021-07-23 MED ORDER — METOPROLOL SUCCINATE ER 50 MG PO TB24: ORAL_TABLET | ORAL | 0 refills | Status: DC

## 2021-07-23 MED ORDER — ATORVASTATIN CALCIUM 10 MG PO TABS: 10.0000 mg | ORAL_TABLET | Freq: Every day | ORAL | 3 refills | Status: DC

## 2021-07-23 NOTE — Patient Instructions (Signed)
  Mr. Colgrove , Thank you for taking time to come for your Medicare Wellness Visit. I appreciate your ongoing commitment to your health goals. Please review the following plan we discussed and let me know if I can assist you in the future.   These are the goals we discussed: ABI will be ordered to evaluate the left leg in terms of vascularity.  We will also get an echocardiogram   This is a list of the screening recommended for you and due dates:  Health Maintenance  Topic Date Due   Flu Shot  06/17/2021   Tetanus Vaccine  10/05/2024   Colon Cancer Screening  12/04/2024   COVID-19 Vaccine  Completed   Hepatitis C Screening: USPSTF Recommendation to screen - Ages 18-79 yo.  Completed   Pneumonia vaccines  Completed   Zoster (Shingles) Vaccine  Completed   HPV Vaccine  Aged Out   Complete foot exam   Discontinued   Hemoglobin A1C  Discontinued   Eye exam for diabetics  Discontinued

## 2021-07-23 NOTE — Progress Notes (Signed)
Adam [name] is a 70 y.o. male who presents for annual wellness visit and follow-up on chronic medical conditions.  He has no particular concerns or complaints.  He continues on losartan/HCTZ as well as metoprolol and amlodipine.  He does note that his blood pressure stays elevated when he comes into the office.  He has a cuff at home but is not using it.  Continues on Lipitor without difficulty.  He would like a refill on sildenafil.  Does have a previous smoking history.  He is having some difficulty getting proper equipment for his prosthetic leg.  His other leg is giving him no difficulty at the present time.  He is retired and now living full-time in [place].  He comes up here to see me and his dentist.   Immunizations and Health Maintenance Immunization History  Administered Date(s) Administered   Fluad Quad(high Dose 65+) 08/01/2019   Influenza, High Dose Seasonal PF 10/01/2016, 08/10/2017, 08/27/2018, 08/09/2020   Moderna Sars-Covid-2 Vaccination 12/20/2019, 01/27/2020, 09/13/2020, 03/04/2021   Pneumococcal Conjugate-13 10/01/2016   Pneumococcal Polysaccharide-23 02/18/2018   Tdap 10/05/2014   Zoster Recombinat (Shingrix) 02/18/2018, 08/27/2018   Zoster, Live 10/05/2014   Health Maintenance Due  Topic Date Due   INFLUENZA VACCINE  06/17/2021    Last colonoscopy: 12/04/2014 Last PSA: 01/18/2016 Dentist: Q six months  Ophtho: Q year  Exercise: N/A   Other doctors caring for patient include: N/A  Advanced Directives: Does Patient Have a Medical Advance Directive?: Yes Type of Advance Directive: Living will, Healthcare Power of Attorney, Out of facility DNR (pink MOST or yellow form) Does patient want to make changes to medical advance directive?: No - Patient declined Copy of [place] in Chart?: No - copy requested  Depression screen:  See questionnaire below.     Depression screen [facility] 2/9 07/23/2021 07/06/2020 06/20/2019 05/26/2017 02/20/2016  Decreased  Interest 0 0 0 0 0  Down, Depressed, Hopeless 0 0 0 0 0  PHQ - 2 Score 0 0 0 0 0    Fall Screen: See Questionaire below.   Fall Risk  07/23/2021 07/06/2020 05/26/2017 02/20/2016 10/05/2014  Falls in the past year? 0 0 No No No  Number falls in past yr: 0 - - - -  Injury with Fall? 0 - - - -  Risk for fall due to : No Fall Risks No Fall Risks - - -    ADL screen:  See questionnaire below.  Functional Status Survey: Is the patient deaf or have difficulty hearing?: No Does the patient have difficulty seeing, even when wearing glasses/contacts?: No Does the patient have difficulty concentrating, remembering, or making decisions?: No Does the patient have difficulty walking or climbing stairs?: No Does the patient have difficulty dressing or bathing?: No Does the patient have difficulty doing errands alone such as visiting a doctor's office or shopping?: No   Review of Systems  Constitutional: -, -unexpected weight change, -anorexia, -fatigue Allergy: -sneezing, -itching, -congestion Dermatology: denies changing moles, rash, lumps ENT: -runny nose, -ear pain, -sore throat,  Cardiology:  -chest pain, -palpitations, -orthopnea, Respiratory: -cough, -shortness of breath, -dyspnea on exertion, -wheezing,  Gastroenterology: -abdominal pain, -nausea, -vomiting, -diarrhea, -constipation, -dysphagia Hematology: -bleeding or bruising problems Musculoskeletal: -arthralgias, -myalgias, -joint swelling, -back pain, - Ophthalmology: -vision changes,  Urology: -dysuria, -difficulty urinating,  -urinary frequency, -urgency, incontinence Neurology: -, -numbness, , -memory loss, -falls, -dizziness    PHYSICAL EXAM:  General Appearance: Alert, cooperative, no distress, appears stated age Head: Normocephalic,  without obvious abnormality, atraumatic Eyes: PERRL, conjunctiva/corneas clear, EOM's intact, fundi benign Ears: Normal TM's and external ear canals Nose: Nares normal, mucosa normal, no  drainage or sinus   tenderness Throat: Lips, mucosa, and tongue normal; teeth and gums normal Neck: Supple, no lymphadenopathy, thyroid:no enlargement/tenderness/nodules; no carotid bruit or JVD Lungs: Clear to auscultation bilaterally without wheezes, rales or ronchi; respirations unlabored Heart: Regular rate and rhythm, S1 and S2 normal, 1-2/6 SEM,no rub or gallop Abdomen: Soft, non-tender, nondistended, normoactive bowel sounds, no masses, no hepatosplenomegaly Extremities: Right leg is missing  pulses: 2+ and symmetric all extremities Skin: Skin color, texture, turgor normal, no rashes or lesions Lymph nodes: Cervical, supraclavicular, and axillary nodes normal Neurologic: CNII-XII intact, normal strength, sensation and gait; reflexes 2+ and symmetric throughout   Psych: Normal mood, affect, hygiene and grooming EKG read by me shows normal heart rate and other parameters also normal.  No pathologic findings. ASSESSMENT/PLAN: Systolic murmur - Plan: EKG 12-Lead, ECHOCARDIOGRAM COMPLETE  Erectile dysfunction due to arterial insufficiency - Plan: sildenafil (REVATIO) 20 MG tablet  PAD (peripheral artery disease) (HCC) - Plan: Lipid panel, VAS [place] ABI WITH/WO TBI  Hyperlipidemia, unspecified hyperlipidemia type - Plan: Lipid panel, atorvastatin (LIPITOR) 10 MG tablet  Phantom pain after amputation of lower extremity (HCC)  S/P AKA (above knee amputation), right ([place])  Essential hypertension - Plan: CBC with Differential/Platelet, Comprehensive metabolic panel, losartan-hydrochlorothiazide (HYZAAR) 100-12.5 MG tablet, metoprolol succinate (TOPROL-XL) 50 MG 24 hr tablet  Need for influenza vaccination - Plan: Flu Vaccine QUAD High Dose(Fluad) I explained that we will need to follow-up on the relatively mild murmur especially since he is really having no symptoms at the present time from this.  He will continue on his other medications.  We will also follow-up on to make sure that he has  good vascular supply to the left leg. Immunization recommendations discussed.  Colonoscopy recommendations reviewed.   Medicare Attestation I have personally reviewed: The patient's medical and social history Their use of alcohol, tobacco or illicit drugs Their current medications and supplements The patient's functional ability including ADLs,fall risks, home safety risks, cognitive, and hearing and visual impairment Diet and physical activities Evidence for depression or mood disorders  The patient's weight, height, and BMI have been recorded in the chart.  I have made referrals, counseling, and provided education to the patient based on review of the above and I have provided the patient with a written personalized care plan for preventive services.     Jill Alexanders, MD   07/23/2021

## 2021-07-24 LAB — LIPID PANEL
Chol/HDL Ratio: 2.5 ratio (ref 0.0–5.0)
Cholesterol, Total: 148 mg/dL (ref 100–199)
HDL: 60 mg/dL (ref >39)
LDL Chol Calc (NIH): 75 mg/dL (ref 0–99)
Triglycerides: 64 mg/dL (ref 0–149)
VLDL Cholesterol Cal: 13 mg/dL (ref 5–40)

## 2021-07-24 LAB — COMPREHENSIVE METABOLIC PANEL
ALT: 35 IU/L (ref 0–44)
AST: 29 IU/L (ref 0–40)
Albumin/Globulin Ratio: 2.2 (ref 1.2–2.2)
Albumin: 5 g/dL — ABNORMAL HIGH (ref 3.8–4.8)
Alkaline Phosphatase: 47 IU/L (ref 44–121)
BUN/Creatinine Ratio: 21 (ref 10–24)
BUN: 13 mg/dL (ref 8–27)
Bilirubin Total: 0.7 mg/dL (ref 0.0–1.2)
CO2: 22 mmol/L (ref 20–29)
Calcium: 9.9 mg/dL (ref 8.6–10.2)
Chloride: 104 mmol/L (ref 96–106)
Creatinine, Ser: 0.63 mg/dL — ABNORMAL LOW (ref 0.76–1.27)
Globulin, Total: 2.3 g/dL (ref 1.5–4.5)
Glucose: 110 mg/dL — ABNORMAL HIGH (ref 65–99)
Potassium: 3.5 mmol/L (ref 3.5–5.2)
Sodium: 142 mmol/L (ref 134–144)
Total Protein: 7.3 g/dL (ref 6.0–8.5)
eGFR: 102 mL/min/{1.73_m2} (ref >59)

## 2021-07-24 LAB — CBC WITH DIFFERENTIAL/PLATELET
Basophils Absolute: 0.1 10*3/uL (ref 0.0–0.2)
Basos: 1 %
EOS (ABSOLUTE): 0.1 10*3/uL (ref 0.0–0.4)
Eos: 2 %
Hematocrit: 43 % (ref 37.5–51.0)
Hemoglobin: 15.3 g/dL (ref 13.0–17.7)
Immature Grans (Abs): 0 10*3/uL (ref 0.0–0.1)
Immature Granulocytes: 0 %
Lymphocytes Absolute: 1.7 10*3/uL (ref 0.7–3.1)
Lymphs: 28 %
MCH: 31.7 pg (ref 26.6–33.0)
MCHC: 35.6 g/dL (ref 31.5–35.7)
MCV: 89 fL (ref 79–97)
Monocytes Absolute: 0.7 10*3/uL (ref 0.1–0.9)
Monocytes: 11 %
Neutrophils Absolute: 3.6 10*3/uL (ref 1.4–7.0)
Neutrophils: 58 %
Platelets: 307 10*3/uL (ref 150–450)
RBC: 4.83 x10E6/uL (ref 4.14–5.80)
RDW: 11.9 % (ref 11.6–15.4)
WBC: 6.1 10*3/uL (ref 3.4–10.8)

## 2021-07-25 ENCOUNTER — Telehealth: Payer: Self-pay

## 2021-07-25 NOTE — Telephone Encounter (Signed)
Spoke to pt to give labs and he needs a script for his socket sleeve. Please write it and I will fax to bio tech. Thanks [employer]

## 2021-08-02 DIAGNOSIS — G4733 Obstructive sleep apnea (adult) (pediatric): Secondary | ICD-10-CM | POA: Diagnosis not present

## 2021-08-08 ENCOUNTER — Other Ambulatory Visit (HOSPITAL_COMMUNITY): Payer: No Typology Code available for payment source

## 2021-08-15 ENCOUNTER — Ambulatory Visit (HOSPITAL_COMMUNITY)
Admission: RE | Admit: 2021-08-15 | Discharge: 2021-08-15 | Disposition: A | Discharge status: Home / Self Care | Payer: Medicare HMO | Source: Ambulatory Visit | Attending: Family Medicine | Admitting: Family Medicine

## 2021-08-15 ENCOUNTER — Other Ambulatory Visit: Payer: Self-pay

## 2021-08-15 ENCOUNTER — Telehealth: Payer: Self-pay

## 2021-08-15 DIAGNOSIS — N5201 Erectile dysfunction due to arterial insufficiency: Secondary | ICD-10-CM

## 2021-08-15 DIAGNOSIS — E1151 Type 2 diabetes mellitus with diabetic peripheral angiopathy without gangrene: Secondary | ICD-10-CM | POA: Insufficient documentation

## 2021-08-15 DIAGNOSIS — R011 Cardiac murmur, unspecified: Secondary | ICD-10-CM | POA: Diagnosis not present

## 2021-08-15 DIAGNOSIS — I739 Peripheral vascular disease, unspecified: Secondary | ICD-10-CM | POA: Diagnosis not present

## 2021-08-15 DIAGNOSIS — Z89611 Acquired absence of right leg above knee: Secondary | ICD-10-CM | POA: Insufficient documentation

## 2021-08-15 DIAGNOSIS — I10 Essential (primary) hypertension: Secondary | ICD-10-CM | POA: Insufficient documentation

## 2021-08-15 DIAGNOSIS — R6889 Other general symptoms and signs: Secondary | ICD-10-CM

## 2021-08-15 DIAGNOSIS — Z87891 Personal history of nicotine dependence: Secondary | ICD-10-CM | POA: Insufficient documentation

## 2021-08-15 DIAGNOSIS — E785 Hyperlipidemia, unspecified: Secondary | ICD-10-CM | POA: Insufficient documentation

## 2021-08-15 LAB — ECHOCARDIOGRAM COMPLETE
Area-P 1/2: 4.15 cm2
S' Lateral: 3.3 cm

## 2021-08-15 MED ORDER — SILDENAFIL CITRATE 20 MG PO TABS: ORAL_TABLET | ORAL | 5 refills | Status: DC

## 2021-08-15 NOTE — Progress Notes (Signed)
VASCULAR LAB    ABIs have been performed.  See CV proc for preliminary results.   Philbert Ocallaghan, RVT 08/15/2021, 12:24 PM

## 2021-08-15 NOTE — Telephone Encounter (Signed)
Called pt to advise of the abi and referral to vascular surgery. He stated that he was not able to get the script for his sildenafil . Pharmacy told him that they  did not have a script on file. Please resend. Kh

## 2021-08-15 NOTE — Progress Notes (Signed)
  Echocardiogram 2D Echocardiogram has been performed.  Fidel Levy 08/15/2021, 11:52 AM

## 2021-08-20 DIAGNOSIS — G4733 Obstructive sleep apnea (adult) (pediatric): Secondary | ICD-10-CM | POA: Diagnosis not present

## 2021-09-01 DIAGNOSIS — G4733 Obstructive sleep apnea (adult) (pediatric): Secondary | ICD-10-CM | POA: Diagnosis not present

## 2021-09-04 ENCOUNTER — Other Ambulatory Visit: Payer: Self-pay | Admitting: Family Medicine

## 2021-09-04 DIAGNOSIS — I10 Essential (primary) hypertension: Secondary | ICD-10-CM

## 2021-09-10 DIAGNOSIS — Z89611 Acquired absence of right leg above knee: Secondary | ICD-10-CM | POA: Diagnosis not present

## 2021-09-11 ENCOUNTER — Ambulatory Visit: Payer: Medicare HMO | Admitting: Vascular Surgery

## 2021-09-11 ENCOUNTER — Encounter: Payer: Self-pay | Admitting: Vascular Surgery

## 2021-09-11 ENCOUNTER — Other Ambulatory Visit: Payer: Self-pay

## 2021-09-11 VITALS — BP 172/87 | HR 82 | Temp 98.4°F | Resp 18 | Ht 70.0 in | Wt 174.0 lb

## 2021-09-11 DIAGNOSIS — G4733 Obstructive sleep apnea (adult) (pediatric): Secondary | ICD-10-CM | POA: Diagnosis not present

## 2021-09-11 DIAGNOSIS — Z89611 Acquired absence of right leg above knee: Secondary | ICD-10-CM | POA: Diagnosis not present

## 2021-09-11 NOTE — Progress Notes (Signed)
[facility] [place]  Patient name: Adam [name] MRN: [id number] DOB: 04/23/1951 Sex: male  REASON FOR CONSULT: Follow-up recent noninvasive studies left lower extremity  HPI: Adam [name] is a 70 y.o. male, who is here today for follow-up.  He is well-known to our practice from prior right leg critical ischemia.  He had presented with infection of his right great toe and underwent arteriography and eventual femoral to popliteal bypass in January 2017 with Dr.Chen.  He had the failure of healing amputation and failure of his bypass and underwent initially a below-knee amputation which did not heal and then formal above-knee amputation by myself on 12/26/2015.  He did have healing with this and is done extremely well walking with prosthesis since that time.  On my last visit with him in January 2018 he was doing well and was being fitted for a new advanced prosthetic which is where quite well.  [place] for discussion of recent noninvasive studies.  He has not had any tissue loss in his left leg.  He does not have any claudication type symptoms.  He reports that he is able to remain quite active and travel and mows his grass and gets around without difficulty with his AKA prosthesis.  Past Medical History:  Diagnosis Date   Gangrene ([place]) 11/22/2015   right great toe   Hypertension    OSA on CPAP    PAD (peripheral artery disease) ([place])    Peripheral vascular disease (HCC)    Type II diabetes mellitus ([place]) dx'd 11/2015    Family History  Problem Relation Age of Onset   Hypertension Mother     SOCIAL HISTORY: Social History   Socioeconomic History   Marital status: Married    Spouse name: Not on file   Number of children: Not on file   Years of education: Not on file   Highest education level: Not on file  Occupational History   Not on file  Tobacco Use   Smoking status: Former     Packs/day: 0.75    Years: 40.00    Pack years: 30.00    Types: Cigarettes    Quit date: 10/06/2011    Years since quitting: 9.9   Smokeless tobacco: Never  Vaping Use   Vaping Use: Never used  Substance and Sexual Activity   Alcohol use: Yes    Alcohol/week: 7.0 standard drinks    Types: 7 Glasses of wine per week   Drug use: No   Sexual activity: Not Currently  Other Topics Concern   Not on file  Social History Narrative   Not on file   Social Determinants of Health   Financial Resource Strain: Not on file  Food Insecurity: Not on file  Transportation Needs: Not on file  Physical Activity: Not on file  Stress: Not on file  Social Connections: Not on file  Intimate Partner Violence: Not on file    Not on File  Current Outpatient Medications  Medication Sig Dispense Refill   amLODipine (NORVASC) 10 MG tablet TAKE 1 TABLET BY MOUTH EVERY DAY 90 tablet 3   aspirin 81 MG tablet Take 81 mg by mouth daily.     atorvastatin (LIPITOR) 10 MG tablet Take 1 tablet (10 mg total) by mouth daily. 90 tablet 3   losartan-hydrochlorothiazide (HYZAAR) 100-12.5 MG tablet Take 1 tablet by mouth daily. 90 tablet 3   metoprolol succinate (TOPROL-XL) 50  MG 24 hr tablet Take with or immediately following a meal. 90 tablet 0   Multiple Vitamin (MULTIVITAMIN) tablet Take 1 tablet by mouth daily.     sildenafil (REVATIO) 20 MG tablet Take up to 5 pills as needed 30 tablet 5   No current facility-administered medications for this visit.    REVIEW OF SYSTEMS:  [X]  denotes positive finding, [ ]  denotes negative finding Cardiac  Comments:  Chest pain or chest pressure:    Shortness of breath upon exertion:    Short of breath when lying flat:    Irregular heart rhythm:        Vascular    Pain in calf, thigh, or hip brought on by ambulation:    Pain in feet at night that wakes you up from your sleep:     Blood clot in your veins:    Leg swelling:         Pulmonary    Oxygen at home:     Productive cough:     Wheezing:         Neurologic    Sudden weakness in arms or legs:     Sudden numbness in arms or legs:     Sudden onset of difficulty speaking or slurred speech:    Temporary loss of vision in one eye:     Problems with dizziness:         Gastrointestinal    Blood in stool:     Vomited blood:         Genitourinary    Burning when urinating:     Blood in urine:        Psychiatric    Major depression:         Hematologic    Bleeding problems:    Problems with blood clotting too easily:        Skin    Rashes or ulcers:        Constitutional    Fever or chills:      PHYSICAL EXAM: Vitals:   09/11/21 1008  BP: (!) 172/87  Pulse: 82  Resp: 18  Temp: 98.4 F (36.9 C)  TempSrc: Temporal  SpO2: 98%  Weight: 174 lb (78.9 kg)  Height: 5\' 10"  (1.778 m)    GENERAL: The patient is a well-nourished male, in no acute distress. The vital signs are documented above. CARDIOVASCULAR: 2+ left femoral pulse.  I do not palpate popliteal or distal pulses. PULMONARY: There is good air exchange  MUSCULOSKELETAL: There are no major deformities or cyanosis.  Right AKA prosthesis in place NEUROLOGIC: No focal weakness or paresthesias are detected. SKIN: There are no ulcers or rashes noted. PSYCHIATRIC: The patient has a normal affect.  DATA:  Noninvasive studies from 08/15/2021 were reviewed.  This reveals ankle arm index on the left of 0.71 with monophasic flow.  I did review his arteriogram from January 2017.  This revealed occlusion of his left superficial femoral artery at the adductor canal with reconstitution of his popliteal artery and two-vessel runoff at that time  MEDICAL ISSUES: Moderate left lower extremity arterial insufficiency related to known superficial femoral artery occlusion.  He is not having any symptoms related to this and no tissue loss.  I again discussed the critical importance to keep an eye on his left foot and seek attention  immediately should he develop any tissue loss.  Otherwise I would not recommend any ongoing noninvasive studies or vascular follow-up unless he develops symptoms.   Sherren Mocha  Katina Dung, MD FACS [facility] Tel [phone] Pager [phone]  Note: Portions of this report may have been transcribed using voice recognition software.  Every effort has been made to ensure accuracy; however, inadvertent computerized transcription errors may still be present.

## 2021-10-02 DIAGNOSIS — G4733 Obstructive sleep apnea (adult) (pediatric): Secondary | ICD-10-CM | POA: Diagnosis not present

## 2021-11-01 DIAGNOSIS — G4733 Obstructive sleep apnea (adult) (pediatric): Secondary | ICD-10-CM | POA: Diagnosis not present

## 2021-12-02 DIAGNOSIS — G4733 Obstructive sleep apnea (adult) (pediatric): Secondary | ICD-10-CM | POA: Diagnosis not present

## 2022-01-12 ENCOUNTER — Other Ambulatory Visit: Payer: Self-pay | Admitting: Family Medicine

## 2022-01-12 DIAGNOSIS — I10 Essential (primary) hypertension: Secondary | ICD-10-CM

## 2022-01-29 ENCOUNTER — Telehealth: Payer: Self-pay

## 2022-01-29 NOTE — Telephone Encounter (Signed)
Patient called today wanting a rx for a right prosthetic leg. He states he was last seen here 09/11/2021 by Dr. Donnetta Hutching and was told he would need to get the rx from Dr. Donnetta Hutching. He last got a rx for one from [facility] whom since then has changed their name to [facility]. Please advise. ?

## 2022-02-27 DIAGNOSIS — R69 Illness, unspecified: Secondary | ICD-10-CM | POA: Diagnosis not present

## 2022-05-19 DIAGNOSIS — L905 Scar conditions and fibrosis of skin: Secondary | ICD-10-CM | POA: Diagnosis not present

## 2022-05-19 DIAGNOSIS — D2262 Melanocytic nevi of left upper limb, including shoulder: Secondary | ICD-10-CM | POA: Diagnosis not present

## 2022-05-19 DIAGNOSIS — L814 Other melanin hyperpigmentation: Secondary | ICD-10-CM | POA: Diagnosis not present

## 2022-05-19 DIAGNOSIS — L559 Sunburn, unspecified: Secondary | ICD-10-CM | POA: Diagnosis not present

## 2022-05-19 DIAGNOSIS — D2261 Melanocytic nevi of right upper limb, including shoulder: Secondary | ICD-10-CM | POA: Diagnosis not present

## 2022-05-19 DIAGNOSIS — L821 Other seborrheic keratosis: Secondary | ICD-10-CM | POA: Diagnosis not present

## 2022-05-19 DIAGNOSIS — D225 Melanocytic nevi of trunk: Secondary | ICD-10-CM | POA: Diagnosis not present

## 2022-07-21 ENCOUNTER — Other Ambulatory Visit: Payer: Self-pay | Admitting: Family Medicine

## 2022-07-21 DIAGNOSIS — I10 Essential (primary) hypertension: Secondary | ICD-10-CM

## 2022-07-23 ENCOUNTER — Encounter: Payer: Self-pay | Admitting: Internal Medicine

## 2022-07-25 ENCOUNTER — Ambulatory Visit (INDEPENDENT_AMBULATORY_CARE_PROVIDER_SITE_OTHER): Payer: Medicare HMO

## 2022-07-25 VITALS — Ht 71.0 in | Wt 182.0 lb

## 2022-07-25 DIAGNOSIS — Z0000 Encounter for general adult medical examination without abnormal findings: Secondary | ICD-10-CM

## 2022-07-25 NOTE — Patient Instructions (Signed)
Adam [name] , Thank you for taking time to come for your Medicare Wellness Visit. I appreciate your ongoing commitment to your health goals. Please review the following plan we discussed and let me know if I can assist you in the future.   Screening recommendations/referrals: Colonoscopy: completed 12/04/2014, due 12/04/2024 Recommended yearly ophthalmology/optometry visit for glaucoma screening and checkup Recommended yearly dental visit for hygiene and checkup  Vaccinations: Influenza vaccine: due Pneumococcal vaccine: completed 02/18/2018 Tdap vaccine: completed 10/05/2014, due 10/05/2024 Shingles vaccine: completed    Covid-19:  09/04/2021, 03/04/2021, 09/13/2020, 01/27/2020, 12/20/2019  Advanced directives: Please bring a copy of your POA (Power of Attorney) and/or Living Will to your next appointment.   Conditions/risks identified: none  Next appointment: Follow up in one year for your annual wellness visit.   Preventive Care 72 Years and Older, Male Preventive care refers to lifestyle choices and visits with your health care provider that can promote health and wellness. What does preventive care include? A yearly physical exam. This is also called an annual well check. Dental exams once or twice a year. Routine eye exams. Ask your health care provider how often you should have your eyes checked. Personal lifestyle choices, including: Daily care of your teeth and gums. Regular physical activity. Eating a healthy diet. Avoiding tobacco and drug use. Limiting alcohol use. Practicing safe sex. Taking low doses of aspirin every day. Taking vitamin and mineral supplements as recommended by your health care provider. What happens during an annual well check? The services and screenings done by your health care provider during your annual well check will depend on your age, overall health, lifestyle risk factors, and family history of disease. Counseling  Your health care provider may  ask you questions about your: Alcohol use. Tobacco use. Drug use. Emotional well-being. Home and relationship well-being. Sexual activity. Eating habits. History of falls. Memory and ability to understand (cognition). Work and work Statistician. Screening  You may have the following tests or measurements: Height, weight, and BMI. Blood pressure. Lipid and cholesterol levels. These may be checked every 5 years, or more frequently if you are over 64 years old. Skin check. Lung cancer screening. You may have this screening every year starting at age 65 if you have a 30-pack-year history of smoking and currently smoke or have quit within the past 15 years. Fecal occult blood test (FOBT) of the stool. You may have this test every year starting at age 69. Flexible sigmoidoscopy or colonoscopy. You may have a sigmoidoscopy every 5 years or a colonoscopy every 10 years starting at age 78. Prostate cancer screening. Recommendations will vary depending on your family history and other risks. Hepatitis C blood test. Hepatitis B blood test. Sexually transmitted disease (STD) testing. Diabetes screening. This is done by checking your blood sugar (glucose) after you have not eaten for a while (fasting). You may have this done every 1-3 years. Abdominal aortic aneurysm (AAA) screening. You may need this if you are a current or former smoker. Osteoporosis. You may be screened starting at age 20 if you are at high risk. Talk with your health care provider about your test results, treatment options, and if necessary, the need for more tests. Vaccines  Your health care provider may recommend certain vaccines, such as: Influenza vaccine. This is recommended every year. Tetanus, diphtheria, and acellular pertussis (Tdap, Td) vaccine. You may need a Td booster every 10 years. Zoster vaccine. You may need this after age 72. Pneumococcal 13-valent conjugate (PCV13) vaccine.  One dose is recommended after age  72. Pneumococcal polysaccharide (PPSV23) vaccine. One dose is recommended after age 72. Talk to your health care provider about which screenings and vaccines you need and how often you need them. This information is not intended to replace advice given to you by your health care provider. Make sure you discuss any questions you have with your health care provider. Document Released: 11/30/2015 Document Revised: 07/23/2016 Document Reviewed: 09/04/2015 Elsevier Interactive Patient Education  2017 [place] Prevention in the Home Falls can cause injuries. They can happen to people of all ages. There are many things you can do to make your home safe and to help prevent falls. What can I do on the outside of my home? Regularly fix the edges of walkways and driveways and fix any cracks. Remove anything that might make you trip as you walk through a door, such as a raised step or threshold. Trim any bushes or trees on the path to your home. Use bright outdoor lighting. Clear any walking paths of anything that might make someone trip, such as rocks or tools. Regularly check to see if handrails are loose or broken. Make sure that both sides of any steps have handrails. Any raised decks and porches should have guardrails on the edges. Have any leaves, snow, or ice cleared regularly. Use sand or salt on walking paths during winter. Clean up any spills in your garage right away. This includes oil or grease spills. What can I do in the bathroom? Use night lights. Install grab bars by the toilet and in the tub and shower. Do not use towel bars as grab bars. Use non-skid mats or decals in the tub or shower. If you need to sit down in the shower, use a plastic, non-slip stool. Keep the floor dry. Clean up any water that spills on the floor as soon as it happens. Remove soap buildup in the tub or shower regularly. Attach bath mats securely with double-sided non-slip rug tape. Do not have throw  rugs and other things on the floor that can make you trip. What can I do in the bedroom? Use night lights. Make sure that you have a light by your bed that is easy to reach. Do not use any sheets or blankets that are too big for your bed. They should not hang down onto the floor. Have a firm chair that has side arms. You can use this for support while you get dressed. Do not have throw rugs and other things on the floor that can make you trip. What can I do in the kitchen? Clean up any spills right away. Avoid walking on wet floors. Keep items that you use a lot in easy-to-reach places. If you need to reach something above you, use a strong step stool that has a grab bar. Keep electrical cords out of the way. Do not use floor polish or wax that makes floors slippery. If you must use wax, use non-skid floor wax. Do not have throw rugs and other things on the floor that can make you trip. What can I do with my stairs? Do not leave any items on the stairs. Make sure that there are handrails on both sides of the stairs and use them. Fix handrails that are broken or loose. Make sure that handrails are as long as the stairways. Check any carpeting to make sure that it is firmly attached to the stairs. Fix any carpet that is loose or  worn. Avoid having throw rugs at the top or bottom of the stairs. If you do have throw rugs, attach them to the floor with carpet tape. Make sure that you have a light switch at the top of the stairs and the bottom of the stairs. If you do not have them, ask someone to add them for you. What else can I do to help prevent falls? Wear shoes that: Do not have high heels. Have rubber bottoms. Are comfortable and fit you well. Are closed at the toe. Do not wear sandals. If you use a stepladder: Make sure that it is fully opened. Do not climb a closed stepladder. Make sure that both sides of the stepladder are locked into place. Ask someone to hold it for you, if  possible. Clearly mark and make sure that you can see: Any grab bars or handrails. First and last steps. Where the edge of each step is. Use tools that help you move around (mobility aids) if they are needed. These include: Canes. Walkers. Scooters. Crutches. Turn on the lights when you go into a dark area. Replace any light bulbs as soon as they burn out. Set up your furniture so you have a clear path. Avoid moving your furniture around. If any of your floors are uneven, fix them. If there are any pets around you, be aware of where they are. Review your medicines with your doctor. Some medicines can make you feel dizzy. This can increase your chance of falling. Ask your doctor what other things that you can do to help prevent falls. This information is not intended to replace advice given to you by your health care provider. Make sure you discuss any questions you have with your health care provider. Document Released: 08/30/2009 Document Revised: 04/10/2016 Document Reviewed: 12/08/2014 Elsevier Interactive Patient Education  2017 [employer].

## 2022-07-25 NOTE — Progress Notes (Signed)
I connected with Adam [name] today by telephone and verified that I am speaking with the correct person using two identifiers. Location patient: home Location provider: work Persons participating in the virtual visit: Adam [name], Glenna Durand LPN.   I discussed the limitations, risks, security and privacy concerns of performing an evaluation and management service by telephone and the availability of in person appointments. I also discussed with the patient that there may be a patient responsible charge related to this service. The patient expressed understanding and verbally consented to this telephonic visit.    Interactive audio and video telecommunications were attempted between this provider and patient, however failed, due to patient having technical difficulties OR patient did not have access to video capability.  We continued and completed visit with audio only.     Vital signs may be patient reported or missing.  Subjective:   Adam [name] is a 71 y.o. male who presents for Medicare Annual/Subsequent preventive examination.  Review of Systems     Cardiac Risk Factors include: advanced age (>48mn, >>48women);dyslipidemia;hypertension;male gender     Objective:    Today's Vitals   07/25/22 1026  Weight: 182 lb (82.6 kg)  Height: '5\' 11"'$  (1.803 m)   Body mass index is 25.38 kg/m.     07/25/2022   10:29 AM 07/23/2021   11:20 AM 10/18/2018    3:31 PM 05/26/2017    2:00 PM 02/25/2017    8:08 AM 03/20/2016    8:07 AM 02/20/2016   10:30 AM  Advanced Directives  Does Patient Have a Medical Advance Directive? Yes Yes Yes Yes Yes Yes Yes  Type of AParamed[place]of [place] will Living will;Healthcare Power of [place] of facility DNR (pink MOST or yellow form) [place]Living will  HS[place]Living will    Does patient want to make changes to medical advance directive?  No - Patient declined  Yes  (MAU/Ambulatory/Procedural Areas - Information given)     Copy of [place] Chart? No - copy requested No - copy requested Yes - validated most recent copy scanned in chart (See row information)  Yes Yes     Current Medications (verified) Outpatient Encounter Medications as of 07/25/2022  Medication Sig   amLODipine (NORVASC) 10 MG tablet TAKE 1 TABLET BY MOUTH EVERY DAY   aspirin 81 MG tablet Take 81 mg by mouth daily.   atorvastatin (LIPITOR) 10 MG tablet Take 1 tablet (10 mg total) by mouth daily.   losartan-hydrochlorothiazide (HYZAAR) 100-12.5 MG tablet Take 1 tablet by mouth daily.   metoprolol succinate (TOPROL-XL) 50 MG 24 hr tablet TAKE 1 TABLET BY MOUTH WITH OR IMMEDIATELY FOLLOWING A MEAL.   Multiple Vitamin (MULTIVITAMIN) tablet Take 1 tablet by mouth daily.   sildenafil (REVATIO) 20 MG tablet Take up to 5 pills as needed   No facility-administered encounter medications on file as of 07/25/2022.    Allergies (verified) Patient has no allergy information on record.   History: Past Medical History:  Diagnosis Date   [place] ([place] 11/22/2015   right great toe   Hypertension    OSA on CPAP    PAD (peripheral artery disease) ([place]    Peripheral vascular disease ([place]    Type II diabetes mellitus ([place] dx'd 11/2015   Past Surgical History:  Procedure Laterality Date   AMPUTATION Right 12/01/2015   Procedure: AMPUTATION RIGHT GREAT;  Surgeon: BConrad Economy MD;  [facility]  Service:  Vascular;  Laterality: Right;   AMPUTATION Right 12/16/2015   Procedure: AMPUTATION BELOW KNEE;  Surgeon: [name], MD;  Location: [facility];  Service: Vascular;  Laterality: Right;   AMPUTATION Right 12/26/2015   Procedure: RIGHT ABOVE KNEE AMPUTATION;  Surgeon: [name], MD;  Location: [facility];  Service: Vascular;  Laterality: Right;   COLONOSCOPY     FEMORAL-POPLITEAL BYPASS GRAFT Right 11/28/2015   Procedure: RIGHT  FEMORAL-POPLITEAL ARTERY BYPASS GRAFT AND CONSTRUCTION  OF MILLER CUFF USING 6 MM X 80 CM PROPATEN  GRAFT ;  Surgeon: Conrad Zap, MD;  Location: [facility];  Service: Vascular;  Laterality: Right;   INGUINAL HERNIA REPAIR Bilateral 2000   PERIPHERAL VASCULAR CATHETERIZATION N/A 11/26/2015   Procedure: Abdominal Aortogram;  Surgeon: [name], MD;  Location: [facility];  Service: Cardiovascular;  Laterality: N/A;   VASECTOMY     Family History  Problem Relation Age of Onset   Hypertension Mother    Social History   Socioeconomic History   Marital status: Married    Spouse name: Not on file   Number of children: Not on file   Years of education: Not on file   Highest education level: Not on file  Occupational History   Not on file  Tobacco Use   Smoking status: Former    Packs/day: 0.75    Years: 40.00    Total pack years: 30.00    Types: Cigarettes    Quit date: 10/06/2011    Years since quitting: 10.8   Smokeless tobacco: Never  Vaping Use   Vaping Use: Never used  Substance and Sexual Activity   Alcohol use: Yes    Alcohol/week: 7.0 standard drinks of alcohol    Types: 7 Glasses of wine per week   Drug use: No   Sexual activity: Not Currently  Other Topics Concern   Not on file  Social History Narrative   Not on file   Social Determinants of Health   Financial Resource Strain: Low Risk  (07/25/2022)   Overall Financial Resource Strain (CARDIA)    Difficulty of Paying Living Expenses: Not hard at all  Food Insecurity: No Food Insecurity (07/25/2022)   Hunger Vital Sign    Worried About Running Out of Food in the Last Year: Never true    Ran Out of Food in the Last Year: Never true  Transportation Needs: No Transportation Needs (07/25/2022)   PRAPARE - Hydrologist (Medical): No    Lack of Transportation (Non-Medical): No  Physical Activity: Inactive (07/25/2022)   Exercise Vital Sign    Days of Exercise per Week: 0 days    Minutes of Exercise per Session: 0 min  Stress: No  Stress Concern Present (07/25/2022)   [place]    Feeling of Stress : Not at all  Social Connections: Not on file    Tobacco Counseling Counseling given: Not Answered   Clinical Intake:  Pre-visit preparation completed: Yes  Pain : No/denies pain     Nutritional Status: BMI 25 -29 Overweight Nutritional Risks: None Diabetes: No  How often do you need to have someone help you when you read instructions, pamphlets, or other written materials from your doctor or pharmacy?: 1 - Never What is the last grade level you completed in school?: college  Diabetic? no  Interpreter Needed?: No  Information entered by :: NAllen LPN   Activities of Daily  Living    07/25/2022   10:31 AM  In your present state of health, do you have any difficulty performing the following activities:  Hearing? 0  Vision? 0  Difficulty concentrating or making decisions? 0  Walking or climbing stairs? 0  Dressing or bathing? 0  Doing errands, shopping? 0  Preparing Food and eating ? N  Using the Toilet? N  In the past six months, have you accidently leaked urine? N  Do you have problems with loss of bowel control? N  Managing your Medications? N  Managing your Finances? N  Housekeeping or managing your Housekeeping? N    Patient Care Team: Denita Lung, MD as PCP - General (Family Medicine) Newt Minion, MD as Consulting Physician (Orthopedic Surgery) Cathlyn Parsons, PA-C as Physician Assistant  Indicate any recent Medical Services you may have received from other than [facility] providers in the past year (date may be approximate).     Assessment:   This is a routine wellness examination for Kainen.  Hearing/Vision screen Vision Screening - Comments:: Regular eye exams, [facility]  Dietary issues and exercise activities discussed: Current Exercise Habits: The patient does not participate in regular exercise at  present   Goals Addressed             This Visit's Progress    Patient Stated       07/25/2022, no goals       Depression Screen    07/25/2022   10:30 AM 07/23/2021   11:21 AM 07/06/2020   11:40 AM 06/20/2019    2:01 PM 05/26/2017    1:40 PM 02/20/2016   10:31 AM 10/05/2014   10:03 AM  PHQ 2/9 Scores  PHQ - 2 Score 0 0 0 0 0 0 0  PHQ- 9 Score 0          Fall Risk    07/25/2022   10:30 AM 09/11/2021   10:11 AM 07/23/2021   11:21 AM 07/06/2020   11:40 AM 05/26/2017    1:40 PM  Fall Risk   Falls in the past year? 0 0 0 0 No  Number falls in past yr: 0 0 0    Injury with Fall? 0 0 0    Risk for fall due to : Medication side effect No Fall Risks No Fall Risks No Fall Risks   Follow up Falls prevention discussed;Education provided;Falls evaluation completed Falls evaluation completed       FALL RISK PREVENTION PERTAINING TO THE HOME:  Any stairs in or around the home? Yes  If so, are there any without handrails? No  Home free of loose throw rugs in walkways, pet beds, electrical cords, etc? Yes  Adequate lighting in your home to reduce risk of falls? Yes   ASSISTIVE DEVICES UTILIZED TO PREVENT FALLS:  Life alert? No  Use of a cane, walker or w/c? No  Grab bars in the bathroom? Yes  Shower chair or bench in shower? Yes  Elevated toilet seat or a handicapped toilet? Yes   TIMED UP AND GO:  Was the test performed? No .      Cognitive Function:        07/25/2022   10:31 AM  6CIT Screen  What Year? 0 points  What month? 0 points  What time? 0 points  Count back from 20 0 points  Months in reverse 0 points  Repeat phrase 0 points  Total Score 0 points    Immunizations Immunization History  Administered Date(s) Administered   Fluad Quad(high Dose 65+) 08/01/2019, 07/23/2021   Influenza, High Dose Seasonal PF 10/01/2016, 08/10/2017, 08/27/2018, 08/09/2020   Moderna Covid-19 Vaccine Bivalent Booster 35yr & up 09/04/2021   Moderna Sars-Covid-2 Vaccination  12/20/2019, 01/27/2020, 09/13/2020, 03/04/2021   Pneumococcal Conjugate-13 10/01/2016   Pneumococcal Polysaccharide-23 02/18/2018   Tdap 10/05/2014   Zoster Recombinat (Shingrix) 02/18/2018, 08/27/2018   Zoster, Live 10/05/2014    TDAP status: Up to date  Flu Vaccine status: Due, Education has been provided regarding the importance of this vaccine. Advised may receive this vaccine at local pharmacy or Health Dept. Aware to provide a copy of the vaccination record if obtained from local pharmacy or Health Dept. Verbalized acceptance and understanding.  Pneumococcal vaccine status: Up to date  Covid-19 vaccine status: Completed vaccines  Qualifies for Shingles Vaccine? Yes   Zostavax completed Yes   Shingrix Completed?: Yes  Screening Tests Health Maintenance  Topic Date Due   COVID-19 Vaccine (6 - Moderna series) 01/05/2022   INFLUENZA VACCINE  06/17/2022   TETANUS/TDAP  10/05/2024   COLONOSCOPY (Pts 45-456yrInsurance coverage will need to be confirmed)  12/04/2024   Pneumonia Vaccine 6560Years old  Completed   Hepatitis C Screening  Completed   Zoster Vaccines- Shingrix  Completed   HPV VACCINES  Aged Out   FOOT EXAM  Discontinued   HEMOGLOBIN A1C  Discontinued   OPHTHALMOLOGY EXAM  Discontinued    Health Maintenance  Health Maintenance Due  Topic Date Due   COVID-19 Vaccine (6 - Moderna series) 01/05/2022   INFLUENZA VACCINE  06/17/2022    Colorectal cancer screening: Type of screening: Colonoscopy. Completed 12/04/2014. Repeat every 10 years  Lung Cancer Screening: (Low Dose CT Chest recommended if Age 446-80ears, 30 pack-year currently smoking OR have quit w/in 15years.) does not qualify.   Lung Cancer Screening Referral: no  Additional Screening:  Hepatitis C Screening: does qualify; Completed 02/18/2018  Vision Screening: Recommended annual ophthalmology exams for early detection of glaucoma and other disorders of the eye. Is the patient up to date with  their annual eye exam?  Yes  Who is the provider or what is the name of the office in which the patient attends annual eye exams?  If pt is not established with a provider, would they like to be referred to a provider to establish care? No .   Dental Screening: Recommended annual dental exams for proper oral hygiene  Community Resource Referral / Chronic Care Management: CRR required this visit?  No   CCM required this visit?  No      Plan:     I have personally reviewed and noted the following in the patient's chart:   Medical and social history Use of alcohol, tobacco or illicit drugs  Current medications and supplements including opioid prescriptions. Patient is not currently taking opioid prescriptions. Functional ability and status Nutritional status Physical activity Advanced directives List of other physicians Hospitalizations, surgeries, and ER visits in previous 12 months Vitals Screenings to include cognitive, depression, and falls Referrals and appointments  In addition, I have reviewed and discussed with patient certain preventive protocols, quality metrics, and best practice recommendations. A written personalized care plan for preventive services as well as general preventive health recommendations were provided to patient.     NiKellie SimmeringLPN   9/03/20/6143 Nurse Notes: none  Due to this being a virtual visit, the after visit summary with patients personalized plan was offered to patient via  mail or my-chart.  Patient would like to access on my-chart

## 2022-07-28 ENCOUNTER — Ambulatory Visit (INDEPENDENT_AMBULATORY_CARE_PROVIDER_SITE_OTHER): Payer: Medicare HMO | Admitting: Family Medicine

## 2022-07-28 ENCOUNTER — Encounter: Payer: Self-pay | Admitting: Family Medicine

## 2022-07-28 VITALS — BP 148/82 | HR 76 | Temp 97.0°F | Ht 69.5 in | Wt 175.6 lb

## 2022-07-28 DIAGNOSIS — Z23 Encounter for immunization: Secondary | ICD-10-CM

## 2022-07-28 DIAGNOSIS — I739 Peripheral vascular disease, unspecified: Secondary | ICD-10-CM

## 2022-07-28 DIAGNOSIS — N5201 Erectile dysfunction due to arterial insufficiency: Secondary | ICD-10-CM | POA: Diagnosis not present

## 2022-07-28 DIAGNOSIS — E785 Hyperlipidemia, unspecified: Secondary | ICD-10-CM

## 2022-07-28 DIAGNOSIS — Z0000 Encounter for general adult medical examination without abnormal findings: Secondary | ICD-10-CM | POA: Diagnosis not present

## 2022-07-28 DIAGNOSIS — I10 Essential (primary) hypertension: Secondary | ICD-10-CM | POA: Diagnosis not present

## 2022-07-28 DIAGNOSIS — G546 Phantom limb syndrome with pain: Secondary | ICD-10-CM

## 2022-07-28 DIAGNOSIS — Z89611 Acquired absence of right leg above knee: Secondary | ICD-10-CM

## 2022-07-28 MED ORDER — AMLODIPINE BESYLATE 10 MG PO TABS: 10.0000 mg | ORAL_TABLET | Freq: Every day | ORAL | 3 refills | Status: DC

## 2022-07-28 MED ORDER — LOSARTAN POTASSIUM-HCTZ 100-12.5 MG PO TABS: 1.0000 | ORAL_TABLET | Freq: Every day | ORAL | 3 refills | Status: DC

## 2022-07-28 MED ORDER — METOPROLOL SUCCINATE ER 50 MG PO TB24: ORAL_TABLET | ORAL | 1 refills | Status: DC

## 2022-07-28 MED ORDER — ATORVASTATIN CALCIUM 10 MG PO TABS: 10.0000 mg | ORAL_TABLET | Freq: Every day | ORAL | 3 refills | Status: DC

## 2022-07-28 NOTE — Progress Notes (Signed)
Complete physical exam  Patient: Adam [name]   DOB: 1951/10/08   71 y.o. Male  MRN: [id number]  Subjective:    Chief Complaint  Patient presents with   Annual Exam    Fasting cpe     Adam [name] is a 71 y.o. male who presents today for a complete physical exam. He reports consuming a general diet.  Staying active at least 20 minutes a day.  He generally feels well. He reports sleeping well. He does not have additional problems to discuss today.  He does have underlying OSA and does intermittently use his CPAP.  He does have an AKA on the right and will be in need of replacement in the near future.  He has had no difficulty with his left leg in terms of pain with physical activity skin or hair changes.  He states he has no difficulty with fatigue or excessive daytime sleepiness.  Continues on his metoprolol as well as amlodipine and losartan/HCTZ and having no difficulty with that.  Continues on Lipitor without any trouble.  Also takes sildenafil on an as-needed basis with good results.  Otherwise his family and social history as well as health maintenance and immunizations was reviewed.   Most recent fall risk assessment:    07/25/2022   10:30 AM  Fall Risk   Falls in the past year? 0  Number falls in past yr: 0  Injury with Fall? 0  Risk for fall due to : Medication side effect  Follow up Falls prevention discussed;Education provided;Falls evaluation completed     Most recent depression screenings:    07/25/2022   10:30 AM 07/23/2021   11:21 AM  PHQ 2/9 Scores  PHQ - 2 Score 0 0  PHQ- 9 Score 0       Patient Active Problem List   Diagnosis Date Noted   Erectile dysfunction due to arterial insufficiency 07/06/2020   Phantom pain after amputation of lower extremity ([place]) 01/18/2016   S/P AKA (above knee amputation), right ([place]) 12/29/2015   PAD (peripheral artery disease) ([place]) 12/15/2015   Hyperlipidemia 12/07/2015   Hypertension 12/04/2014   Past Medical History:   Diagnosis Date   Gangrene ([place]) 11/22/2015   right great toe   Hypertension    OSA on CPAP    PAD (peripheral artery disease) ([place])    Peripheral vascular disease ([place])    Type II diabetes mellitus ([place]) dx'd 11/2015   Past Surgical History:  Procedure Laterality Date   AMPUTATION Right 12/01/2015   Procedure: AMPUTATION RIGHT GREAT;  Surgeon: Conrad Lusk, MD;  Location: [facility];  Service: Vascular;  Laterality: Right;   AMPUTATION Right 12/16/2015   Procedure: AMPUTATION BELOW KNEE;  Surgeon: [name], MD;  Location: [facility];  Service: Vascular;  Laterality: Right;   AMPUTATION Right 12/26/2015   Procedure: RIGHT ABOVE KNEE AMPUTATION;  Surgeon: [name], MD;  Location: [facility];  Service: Vascular;  Laterality: Right;   COLONOSCOPY     FEMORAL-POPLITEAL BYPASS GRAFT Right 11/28/2015   Procedure: RIGHT  FEMORAL-POPLITEAL ARTERY BYPASS GRAFT AND CONSTRUCTION OF MILLER CUFF USING 6 MM X 80 CM PROPATEN  GRAFT ;  Surgeon: Conrad Hutton, MD;  Location: [facility];  Service: Vascular;  Laterality: Right;   INGUINAL HERNIA REPAIR Bilateral 2000   PERIPHERAL VASCULAR CATHETERIZATION N/A 11/26/2015   Procedure: Abdominal Aortogram;  Surgeon: [name], MD;  Location: [facility];  Service: Cardiovascular;  Laterality: N/A;  VASECTOMY     Social History   Tobacco Use   Smoking status: Former    Packs/day: 0.75    Years: 40.00    Total pack years: 30.00    Types: Cigarettes    Quit date: 10/06/2011    Years since quitting: 10.8   Smokeless tobacco: Never  Vaping Use   Vaping Use: Never used  Substance Use Topics   Alcohol use: Yes    Alcohol/week: 7.0 standard drinks of alcohol    Types: 7 Glasses of wine per week   Drug use: No   Family History  Problem Relation Age of Onset   Hypertension Mother    Not on File    Patient Care Team: Denita Lung, MD as PCP - General (Family Medicine) Newt Minion, MD as Consulting Physician (Orthopedic Surgery) Elizabeth Sauer as Physician Assistant   Outpatient Medications Prior to Visit  Medication Sig Note   Multiple Vitamin (MULTIVITAMIN) tablet Take 1 tablet by mouth daily.    sildenafil (REVATIO) 20 MG tablet Take up to 5 pills as needed 07/28/2022: Prn last dose a month ago   [DISCONTINUED] amLODipine (NORVASC) 10 MG tablet TAKE 1 TABLET BY MOUTH EVERY DAY    [DISCONTINUED] aspirin 81 MG tablet Take 81 mg by mouth daily.    [DISCONTINUED] atorvastatin (LIPITOR) 10 MG tablet Take 1 tablet (10 mg total) by mouth daily.    [DISCONTINUED] losartan-hydrochlorothiazide (HYZAAR) 100-12.5 MG tablet Take 1 tablet by mouth daily.    [DISCONTINUED] metoprolol succinate (TOPROL-XL) 50 MG 24 hr tablet TAKE 1 TABLET BY MOUTH WITH OR IMMEDIATELY FOLLOWING A MEAL.    No facility-administered medications prior to visit.    Review of Systems  All other systems reviewed and are negative.         Objective:     BP (!) 148/82   Pulse 76   Temp (!) 97 F (36.1 C)   Ht 5' 9.5" (1.765 m)   Wt 175 lb 9.6 oz (79.7 kg)   SpO2 99%   BMI 25.56 kg/m  BP Readings from Last 3 Encounters:  07/28/22 (!) 148/82  09/11/21 (!) 172/87  07/23/21 (!) 148/88   Wt Readings from Last 3 Encounters:  07/28/22 175 lb 9.6 oz (79.7 kg)  07/25/22 182 lb (82.6 kg)  09/11/21 174 lb (78.9 kg)      Physical Exam  Alert and in no distress. Tympanic membranes and canals are normal. Pharyngeal area is normal. Neck is supple without adenopathy or thyromegaly. Cardiac exam shows a regular sinus rhythm without murmurs or gallops. Lungs are clear to auscultation. Prosthesis is in place on the right. Last CBC Lab Results  Component Value Date   WBC 6.1 07/23/2021   HGB 15.3 07/23/2021   HCT 43.0 07/23/2021   MCV 89 07/23/2021   MCH 31.7 07/23/2021   RDW 11.9 07/23/2021   PLT 307 02/77/4128   Last metabolic panel Lab Results  Component Value Date   GLUCOSE 110 (H) 07/23/2021   NA 142 07/23/2021   K 3.5 07/23/2021    CL 104 07/23/2021   CO2 22 07/23/2021   BUN 13 07/23/2021   CREATININE 0.63 (L) 07/23/2021   EGFR 102 07/23/2021   CALCIUM 9.9 07/23/2021   PROT 7.3 07/23/2021   ALBUMIN 5.0 (H) 07/23/2021   LABGLOB 2.3 07/23/2021   AGRATIO 2.2 07/23/2021   BILITOT 0.7 07/23/2021   ALKPHOS 47 07/23/2021   AST 29 07/23/2021   ALT 35 07/23/2021  ANIONGAP 13 12/31/2015   Last lipids Lab Results  Component Value Date   CHOL 148 07/23/2021   HDL 60 07/23/2021   LDLCALC 75 07/23/2021   TRIG 64 07/23/2021   CHOLHDL 2.5 07/23/2021        Assessment & Plan:   Routine general medical examination at a health care facility - Plan: CBC with Differential/Platelet, Comprehensive metabolic panel, Lipid panel  Erectile dysfunction due to arterial insufficiency - Plan: CBC with Differential/Platelet, Comprehensive metabolic panel  Hyperlipidemia, unspecified hyperlipidemia type - Plan: Lipid panel, atorvastatin (LIPITOR) 10 MG tablet  Phantom pain after amputation of lower extremity (HCC) - Plan: CBC with Differential/Platelet, Comprehensive metabolic panel  S/P AKA (above knee amputation), right (HCC)  PAD (peripheral artery disease) (HCC)  Need for influenza vaccination - Plan: Flu Vaccine QUAD High Dose(Fluad)  Essential hypertension - Plan: losartan-hydrochlorothiazide (HYZAAR) 100-12.5 MG tablet, metoprolol succinate (TOPROL-XL) 50 MG 24 hr tablet, amLODipine (NORVASC) 10 MG tablet   Immunization History  Administered Date(s) Administered   Fluad Quad(high Dose 65+) 08/01/2019, 07/23/2021, 07/28/2022   Influenza, High Dose Seasonal PF 10/01/2016, 08/10/2017, 08/27/2018, 08/09/2020   Moderna Covid-19 Vaccine Bivalent Booster 74yrs & up 09/04/2021   Moderna Sars-Covid-2 Vaccination 12/20/2019, 01/27/2020, 09/13/2020, 03/04/2021   Pneumococcal Conjugate-13 10/01/2016   Pneumococcal Polysaccharide-23 02/18/2018   Tdap 10/05/2014   Zoster Recombinat (Shingrix) 02/18/2018, 08/27/2018   Zoster,  Live 10/05/2014    Health Maintenance  Topic Date Due   COVID-19 Vaccine (6 - Moderna series) 01/05/2022   TETANUS/TDAP  10/05/2024   COLONOSCOPY (Pts 45-85yrs Insurance coverage will need to be confirmed)  12/04/2024   Pneumonia Vaccine 56+ Years old  Completed   INFLUENZA VACCINE  Completed   Hepatitis C Screening  Completed   Zoster Vaccines- Shingrix  Completed   HPV VACCINES  Aged Out   FOOT EXAM  Discontinued   HEMOGLOBIN A1C  Discontinued   OPHTHALMOLOGY EXAM  Discontinued  He is retired now and enjoying it.  He will continue on his present medications and when he needs a refill on his sildenafil he will call me.   Problem List Items Addressed This Visit     Erectile dysfunction due to arterial insufficiency   Relevant Medications   losartan-hydrochlorothiazide (HYZAAR) 100-12.5 MG tablet   metoprolol succinate (TOPROL-XL) 50 MG 24 hr tablet   amLODipine (NORVASC) 10 MG tablet   atorvastatin (LIPITOR) 10 MG tablet   Other Relevant Orders   CBC with Differential/Platelet   Comprehensive metabolic panel   Hyperlipidemia   Relevant Medications   losartan-hydrochlorothiazide (HYZAAR) 100-12.5 MG tablet   metoprolol succinate (TOPROL-XL) 50 MG 24 hr tablet   amLODipine (NORVASC) 10 MG tablet   atorvastatin (LIPITOR) 10 MG tablet   Other Relevant Orders   Lipid panel   PAD (peripheral artery disease) (HCC)   Relevant Medications   losartan-hydrochlorothiazide (HYZAAR) 100-12.5 MG tablet   metoprolol succinate (TOPROL-XL) 50 MG 24 hr tablet   amLODipine (NORVASC) 10 MG tablet   atorvastatin (LIPITOR) 10 MG tablet   Phantom pain after amputation of lower extremity (HCC)   Relevant Orders   CBC with Differential/Platelet   Comprehensive metabolic panel   S/P AKA (above knee amputation), right (HCC)   Other Visit Diagnoses     Routine general medical examination at a health care facility    -  Primary   Relevant Orders   CBC with Differential/Platelet    Comprehensive metabolic panel   Lipid panel   Need for influenza vaccination  Relevant Orders   Flu Vaccine QUAD High Dose(Fluad) (Completed)   Essential hypertension       Relevant Medications   losartan-hydrochlorothiazide (HYZAAR) 100-12.5 MG tablet   metoprolol succinate (TOPROL-XL) 50 MG 24 hr tablet   amLODipine (NORVASC) 10 MG tablet   atorvastatin (LIPITOR) 10 MG tablet      No follow-ups on file.     Jill Alexanders, MD

## 2022-07-29 DIAGNOSIS — H5212 Myopia, left eye: Secondary | ICD-10-CM | POA: Diagnosis not present

## 2022-07-29 DIAGNOSIS — H43392 Other vitreous opacities, left eye: Secondary | ICD-10-CM | POA: Diagnosis not present

## 2022-07-29 LAB — COMPREHENSIVE METABOLIC PANEL
ALT: 34 IU/L (ref 0–44)
AST: 22 IU/L (ref 0–40)
Albumin/Globulin Ratio: 2 (ref 1.2–2.2)
Albumin: 4.9 g/dL — ABNORMAL HIGH (ref 3.8–4.8)
Alkaline Phosphatase: 56 IU/L (ref 44–121)
BUN/Creatinine Ratio: 17 (ref 10–24)
BUN: 11 mg/dL (ref 8–27)
Bilirubin Total: 0.7 mg/dL (ref 0.0–1.2)
CO2: 22 mmol/L (ref 20–29)
Calcium: 9.8 mg/dL (ref 8.6–10.2)
Chloride: 102 mmol/L (ref 96–106)
Creatinine, Ser: 0.63 mg/dL — ABNORMAL LOW (ref 0.76–1.27)
Globulin, Total: 2.4 g/dL (ref 1.5–4.5)
Glucose: 135 mg/dL — ABNORMAL HIGH (ref 70–99)
Potassium: 4 mmol/L (ref 3.5–5.2)
Sodium: 140 mmol/L (ref 134–144)
Total Protein: 7.3 g/dL (ref 6.0–8.5)
eGFR: 102 mL/min/{1.73_m2} (ref >59)

## 2022-07-29 LAB — CBC WITH DIFFERENTIAL/PLATELET
Basophils Absolute: 0.1 10*3/uL (ref 0.0–0.2)
Basos: 1 %
EOS (ABSOLUTE): 0.1 10*3/uL (ref 0.0–0.4)
Eos: 2 %
Hematocrit: 44.5 % (ref 37.5–51.0)
Hemoglobin: 15.2 g/dL (ref 13.0–17.7)
Immature Grans (Abs): 0 10*3/uL (ref 0.0–0.1)
Immature Granulocytes: 0 %
Lymphocytes Absolute: 1.7 10*3/uL (ref 0.7–3.1)
Lymphs: 25 %
MCH: 31.3 pg (ref 26.6–33.0)
MCHC: 34.2 g/dL (ref 31.5–35.7)
MCV: 92 fL (ref 79–97)
Monocytes Absolute: 0.6 10*3/uL (ref 0.1–0.9)
Monocytes: 8 %
Neutrophils Absolute: 4.5 10*3/uL (ref 1.4–7.0)
Neutrophils: 64 %
Platelets: 331 10*3/uL (ref 150–450)
RBC: 4.86 x10E6/uL (ref 4.14–5.80)
RDW: 13 % (ref 11.6–15.4)
WBC: 7 10*3/uL (ref 3.4–10.8)

## 2022-07-29 LAB — LIPID PANEL
Chol/HDL Ratio: 2.3 ratio (ref 0.0–5.0)
Cholesterol, Total: 142 mg/dL (ref 100–199)
HDL: 63 mg/dL (ref >39)
LDL Chol Calc (NIH): 65 mg/dL (ref 0–99)
Triglycerides: 70 mg/dL (ref 0–149)
VLDL Cholesterol Cal: 14 mg/dL (ref 5–40)

## 2022-08-20 LAB — HGB A1C W/O EAG: Hgb A1c MFr Bld: 5.7 % — ABNORMAL HIGH (ref 4.8–5.6)

## 2022-08-20 LAB — SPECIMEN STATUS REPORT

## 2022-09-04 DIAGNOSIS — Z0100 Encounter for examination of eyes and vision without abnormal findings: Secondary | ICD-10-CM | POA: Diagnosis not present

## 2022-09-08 ENCOUNTER — Encounter: Payer: Self-pay | Admitting: Internal Medicine

## 2022-09-18 DIAGNOSIS — I6789 Other cerebrovascular disease: Secondary | ICD-10-CM | POA: Diagnosis not present

## 2022-09-18 DIAGNOSIS — F17210 Nicotine dependence, cigarettes, uncomplicated: Secondary | ICD-10-CM | POA: Diagnosis not present

## 2022-09-18 DIAGNOSIS — R2681 Unsteadiness on feet: Secondary | ICD-10-CM | POA: Diagnosis not present

## 2022-09-18 DIAGNOSIS — Z5989 Other problems related to housing and economic circumstances: Secondary | ICD-10-CM | POA: Diagnosis not present

## 2022-09-18 DIAGNOSIS — I6782 Cerebral ischemia: Secondary | ICD-10-CM | POA: Diagnosis not present

## 2022-09-18 DIAGNOSIS — Z79899 Other long term (current) drug therapy: Secondary | ICD-10-CM | POA: Diagnosis not present

## 2022-09-18 DIAGNOSIS — I10 Essential (primary) hypertension: Secondary | ICD-10-CM | POA: Diagnosis not present

## 2022-09-18 DIAGNOSIS — I639 Cerebral infarction, unspecified: Secondary | ICD-10-CM | POA: Diagnosis not present

## 2022-09-18 DIAGNOSIS — R531 Weakness: Secondary | ICD-10-CM | POA: Diagnosis not present

## 2022-09-18 DIAGNOSIS — R29700 NIHSS score 0: Secondary | ICD-10-CM | POA: Diagnosis not present

## 2022-09-18 DIAGNOSIS — R29818 Other symptoms and signs involving the nervous system: Secondary | ICD-10-CM | POA: Diagnosis not present

## 2022-09-18 DIAGNOSIS — R69 Illness, unspecified: Secondary | ICD-10-CM | POA: Diagnosis not present

## 2022-09-19 DIAGNOSIS — I77810 Thoracic aortic ectasia: Secondary | ICD-10-CM | POA: Diagnosis not present

## 2022-09-19 DIAGNOSIS — I639 Cerebral infarction, unspecified: Secondary | ICD-10-CM | POA: Diagnosis not present

## 2022-09-19 DIAGNOSIS — I10 Essential (primary) hypertension: Secondary | ICD-10-CM | POA: Diagnosis not present

## 2022-09-19 DIAGNOSIS — R69 Illness, unspecified: Secondary | ICD-10-CM | POA: Diagnosis not present

## 2022-09-19 DIAGNOSIS — I517 Cardiomegaly: Secondary | ICD-10-CM | POA: Diagnosis not present

## 2022-09-23 DIAGNOSIS — I10 Essential (primary) hypertension: Secondary | ICD-10-CM | POA: Diagnosis not present

## 2022-09-23 DIAGNOSIS — Z09 Encounter for follow-up examination after completed treatment for conditions other than malignant neoplasm: Secondary | ICD-10-CM | POA: Diagnosis not present

## 2022-09-23 DIAGNOSIS — R531 Weakness: Secondary | ICD-10-CM | POA: Diagnosis not present

## 2022-09-23 DIAGNOSIS — Z8673 Personal history of transient ischemic attack (TIA), and cerebral infarction without residual deficits: Secondary | ICD-10-CM | POA: Diagnosis not present

## 2022-10-21 DIAGNOSIS — I10 Essential (primary) hypertension: Secondary | ICD-10-CM | POA: Diagnosis not present

## 2022-10-21 DIAGNOSIS — R531 Weakness: Secondary | ICD-10-CM | POA: Diagnosis not present

## 2022-10-21 DIAGNOSIS — E782 Mixed hyperlipidemia: Secondary | ICD-10-CM | POA: Diagnosis not present

## 2022-10-21 DIAGNOSIS — I6930 Unspecified sequelae of cerebral infarction: Secondary | ICD-10-CM | POA: Diagnosis not present

## 2022-12-08 ENCOUNTER — Telehealth: Payer: Self-pay

## 2022-12-17 NOTE — Progress Notes (Signed)
Office Note     CC:  follow up Requesting Provider:  Denita Lung, MD  HPI: Adam [name] is a 72 y.o. ([birth year]) male who presents for follow up of PAD. He was last seen in 2022 by Dr. Donnetta Hutching. He has remote history of right above-knee amputation on 12/26/2015 by Dr. Donnetta Hutching. He had failed revascularization attempts by and unfortunately ultimately required amputation. This initially was a BKA but this did not heal further requiring AKA. He is walking with an above-knee prosthesis. He does use a cane to assist with walking at times. He is quite active and travels through airports, lives at [employer] and spends good amount of time on his boat, mows his grass, etc.  He is here today to try to hopefully get a new prosthesis. His current one is from 2017 and is not allowing him to ambulate and get around on variable surfaces like he would like. Her denies any pain In his residual limb. He does not have any pain in his left leg on ambulation or rest, no tissue loss. He does have some swelling in the left leg at times but this is not bothersome to him.   The pt is on a statin for cholesterol management.  The pt is not on a daily aspirin.  Other AC:  none The pt is on CCB, BB, Hyzaar for hypertension.   The pt is not diabetic.   Tobacco hx:  former, quit 2012  Past Medical History:  Diagnosis Date   Gangrene ([place]) 11/22/2015   right great toe   Hypertension    OSA on CPAP    PAD (peripheral artery disease) ([place])    Peripheral vascular disease ([place])    Type II diabetes mellitus ([place]) dx'd 11/2015    Past Surgical History:  Procedure Laterality Date   AMPUTATION Right 12/01/2015   Procedure: AMPUTATION RIGHT GREAT;  Surgeon: Conrad Perry, MD;  Location: [facility];  Service: Vascular;  Laterality: Right;   AMPUTATION Right 12/16/2015   Procedure: AMPUTATION BELOW KNEE;  Surgeon: [name], MD;  Location: [facility];  Service: Vascular;  Laterality: Right;   AMPUTATION Right 12/26/2015   Procedure: RIGHT  ABOVE KNEE AMPUTATION;  Surgeon: [name], MD;  Location: [facility];  Service: Vascular;  Laterality: Right;   COLONOSCOPY     FEMORAL-POPLITEAL BYPASS GRAFT Right 11/28/2015   Procedure: RIGHT  FEMORAL-POPLITEAL ARTERY BYPASS GRAFT AND CONSTRUCTION OF MILLER CUFF USING 6 MM X 80 CM PROPATEN  GRAFT ;  Surgeon: Conrad Gulf, MD;  Location: [facility];  Service: Vascular;  Laterality: Right;   INGUINAL HERNIA REPAIR Bilateral 2000   PERIPHERAL VASCULAR CATHETERIZATION N/A 11/26/2015   Procedure: Abdominal Aortogram;  Surgeon: [name], MD;  Location: [facility];  Service: Cardiovascular;  Laterality: N/A;   VASECTOMY      Social History   Socioeconomic History   Marital status: Married    Spouse name: Not on file   Number of children: Not on file   Years of education: Not on file   Highest education level: Not on file  Occupational History   Not on file  Tobacco Use   Smoking status: Former    Packs/day: 0.75    Years: 40.00    Total pack years: 30.00    Types: Cigarettes    Quit date: 10/06/2011    Years since quitting: 11.2    Passive exposure: Never   Smokeless tobacco: Never  Vaping Use  Vaping Use: Never used  Substance and Sexual Activity   Alcohol use: Yes    Alcohol/week: 7.0 standard drinks of alcohol    Types: 7 Glasses of wine per week   Drug use: No   Sexual activity: Not Currently  Other Topics Concern   Not on file  Social History Narrative   Not on file   Social Determinants of Health   Financial Resource Strain: Low Risk  (07/25/2022)   Overall Financial Resource Strain (CARDIA)    Difficulty of Paying Living Expenses: Not hard at all  Food Insecurity: No Food Insecurity (07/25/2022)   Hunger Vital Sign    Worried About Running Out of Food in the Last Year: Never true    Ran Out of Food in the Last Year: Never true  Transportation Needs: No Transportation Needs (07/25/2022)   PRAPARE - Hydrologist (Medical): No     Lack of Transportation (Non-Medical): No  Physical Activity: Inactive (07/25/2022)   Exercise Vital Sign    Days of Exercise per Week: 0 days    Minutes of Exercise per Session: 0 min  Stress: No Stress Concern Present (07/25/2022)   [place]    Feeling of Stress : Not at all  Social Connections: Not on file  Intimate Partner Violence: Not on file    Family History  Problem Relation Age of Onset   Hypertension Mother     Current Outpatient Medications  Medication Sig Dispense Refill   amLODipine (NORVASC) 10 MG tablet Take 1 tablet (10 mg total) by mouth daily. 90 tablet 3   atorvastatin (LIPITOR) 10 MG tablet Take 1 tablet (10 mg total) by mouth daily. 90 tablet 3   losartan-hydrochlorothiazide (HYZAAR) 100-12.5 MG tablet Take 1 tablet by mouth daily. 90 tablet 3   metoprolol succinate (TOPROL-XL) 50 MG 24 hr tablet Take with or immediately following a meal. 90 tablet 1   Multiple Vitamin (MULTIVITAMIN) tablet Take 1 tablet by mouth daily.     sildenafil (REVATIO) 20 MG tablet Take up to 5 pills as needed 30 tablet 5   No current facility-administered medications for this visit.    No Known Allergies   REVIEW OF SYSTEMS:  '[X]'$  denotes positive finding, '[ ]'$  denotes negative finding Cardiac  Comments:  Chest pain or chest pressure:    Shortness of breath upon exertion:    Short of breath when lying flat:    Irregular heart rhythm:        Vascular    Pain in calf, thigh, or hip brought on by ambulation:    Pain in feet at night that wakes you up from your sleep:     Blood clot in your veins:    Leg swelling:  X       Pulmonary    Oxygen at home:    Productive cough:     Wheezing:         Neurologic    Sudden weakness in arms or legs:     Sudden numbness in arms or legs:     Sudden onset of difficulty speaking or slurred speech:    Temporary loss of vision in one eye:     Problems with dizziness:          Gastrointestinal    Blood in stool:     Vomited blood:         Genitourinary    Burning when urinating:  Blood in urine:        Psychiatric    Major depression:         Hematologic    Bleeding problems:    Problems with blood clotting too easily:        Skin    Rashes or ulcers:        Constitutional    Fever or chills:      PHYSICAL EXAMINATION:  Vitals:   12/23/22 1253  BP: (!) 178/92  Pulse: 100  Resp: 20  Temp: 98.6 F (37 C)  TempSrc: Temporal  SpO2: 100%  Weight: 180 lb 6.4 oz (81.8 kg)  Height: 5' 9.5" (1.765 m)    General:  WDWN in NAD; vital signs documented above Gait: Normal, ambulates well with Right AKA prosthesis HENT: WNL, normocephalic Pulmonary: normal non-labored breathing Cardiac: regular HR Vascular Exam/Pulses: No palpable distal pulses in LLE. Foot warm and well perfused. Some edema present in left leg. No non healing wounds Musculoskeletal: no muscle wasting or atrophy  Neurologic: A&O X 3;  No focal weakness or paresthesias are detected Psychiatric:  The pt has Normal affect.  ASSESSMENT/PLAN:: 72 y.o. male here for follow up for PAD. He was last seen in 2022 by Dr. Donnetta Hutching. He has remote history of right above-knee amputation on 12/26/2015 by Dr. Donnetta Hutching. He had failed revascularization attempts by and unfortunately ultimately required amputation. This initially was a BKA but this did not heal further requiring AKA. He is walking with an above-knee prosthesis, however this is now old and not allowing him to do that activities he would like to be able to do. He therefore is here to obtain prescription for new prosthesis. He is not having any issues with his residual limb. No LLE symptoms. He has known moderate arterial disease in LLE but remains asymptomatic. He can follow up as needed regarding this. Will make new referral to [facility] for prosthesis.  -The patient has a right Above Knee Amputation. The patient is well motivated to return  to their prior functional status by utilizing a prosthesis to perform ADL's and maintain a healthy lifestyle. The patient has the physical and cognitive capacity to function with a prosthesis.   Functional Level: K3 Community Ambulator: Has the ability or potential for ambulation with variable cadence, to traverse most environmental barriers, and may have vocational, therapeutic, or exercise activity that demands prosthetic utilization beyond simple locomotion. Pt may benefit from Education officer, environmental.   Residual Limb History: The skin condition of the residual limb is well appearing. The patient will continue to monitor the skin of the residual limb and follow hygiene instructions.  The patient is experiencing no pain related to amputation  Prosthetic Prescription Plan: Counseling and education regarding prosthetic management will be provided to the patient via a certified prosthetist. A multi-discipline team, including physical therapy, will manage the prosthetic fabrication, fitting and prosthetic gait training.   Karoline Caldwell, PA-C [facility] [phone]  Clinic MD:   Roxanne Mins

## 2022-12-23 ENCOUNTER — Ambulatory Visit: Payer: Medicare HMO | Admitting: Physician Assistant

## 2022-12-23 VITALS — BP 178/92 | HR 100 | Temp 98.6°F | Resp 20 | Ht 69.5 in | Wt 180.4 lb

## 2022-12-23 DIAGNOSIS — Z89611 Acquired absence of right leg above knee: Secondary | ICD-10-CM | POA: Diagnosis not present

## 2022-12-23 DIAGNOSIS — I739 Peripheral vascular disease, unspecified: Secondary | ICD-10-CM | POA: Diagnosis not present

## 2023-01-20 DIAGNOSIS — M5442 Lumbago with sciatica, left side: Secondary | ICD-10-CM | POA: Diagnosis not present

## 2023-01-20 DIAGNOSIS — G8929 Other chronic pain: Secondary | ICD-10-CM | POA: Diagnosis not present

## 2023-01-20 DIAGNOSIS — R69 Illness, unspecified: Secondary | ICD-10-CM | POA: Diagnosis not present

## 2023-02-14 DIAGNOSIS — G4733 Obstructive sleep apnea (adult) (pediatric): Secondary | ICD-10-CM | POA: Diagnosis not present

## 2023-02-25 DIAGNOSIS — Z89611 Acquired absence of right leg above knee: Secondary | ICD-10-CM | POA: Diagnosis not present

## 2023-05-25 DIAGNOSIS — Z0000 Encounter for general adult medical examination without abnormal findings: Secondary | ICD-10-CM | POA: Diagnosis not present

## 2023-05-25 DIAGNOSIS — Z125 Encounter for screening for malignant neoplasm of prostate: Secondary | ICD-10-CM | POA: Diagnosis not present

## 2023-05-25 DIAGNOSIS — Z1211 Encounter for screening for malignant neoplasm of colon: Secondary | ICD-10-CM | POA: Diagnosis not present

## 2023-05-25 DIAGNOSIS — I10 Essential (primary) hypertension: Secondary | ICD-10-CM | POA: Diagnosis not present

## 2023-05-29 ENCOUNTER — Telehealth: Payer: Self-pay

## 2023-05-29 NOTE — Telephone Encounter (Signed)
Returned pt's call after he left VM on triage line. Left him a VM to call us if he still needs assistance.

## 2023-06-01 ENCOUNTER — Other Ambulatory Visit: Payer: Self-pay | Admitting: Family Medicine

## 2023-06-01 DIAGNOSIS — I10 Essential (primary) hypertension: Secondary | ICD-10-CM

## 2023-06-29 ENCOUNTER — Telehealth: Payer: Self-pay | Admitting: Family Medicine

## 2023-06-29 NOTE — Telephone Encounter (Signed)
Pt wife Adam [name] asks if you can please give her a call tomorrow. She has major concerns for Adam [name]. Provided phone number [phone]. She is on his HIPAA form.

## 2023-07-01 NOTE — Telephone Encounter (Signed)
Tried returning call. Left message to call back.

## 2023-07-14 NOTE — Telephone Encounter (Signed)
A user error has taken place: encounter opened in error, closed for administrative reasons.

## 2023-07-31 ENCOUNTER — Telehealth: Payer: Self-pay

## 2023-07-31 NOTE — Telephone Encounter (Signed)
Unsuccessful attempt to reach patient on preferred number listed in notes for scheduled AWV. Left message on voicemail okay to reschedule. 

## 2023-08-04 ENCOUNTER — Encounter: Payer: Self-pay | Admitting: Family Medicine

## 2023-08-04 ENCOUNTER — Ambulatory Visit (INDEPENDENT_AMBULATORY_CARE_PROVIDER_SITE_OTHER): Payer: Medicare HMO | Admitting: Family Medicine

## 2023-08-04 VITALS — BP 160/80 | HR 72 | Ht 69.0 in | Wt 177.4 lb

## 2023-08-04 DIAGNOSIS — I10 Essential (primary) hypertension: Secondary | ICD-10-CM

## 2023-08-04 DIAGNOSIS — I739 Peripheral vascular disease, unspecified: Secondary | ICD-10-CM | POA: Diagnosis not present

## 2023-08-04 DIAGNOSIS — G546 Phantom limb syndrome with pain: Secondary | ICD-10-CM

## 2023-08-04 DIAGNOSIS — Z23 Encounter for immunization: Secondary | ICD-10-CM | POA: Diagnosis not present

## 2023-08-04 DIAGNOSIS — E782 Mixed hyperlipidemia: Secondary | ICD-10-CM | POA: Diagnosis not present

## 2023-08-04 DIAGNOSIS — Z0000 Encounter for general adult medical examination without abnormal findings: Secondary | ICD-10-CM | POA: Diagnosis not present

## 2023-08-04 DIAGNOSIS — N5201 Erectile dysfunction due to arterial insufficiency: Secondary | ICD-10-CM | POA: Diagnosis not present

## 2023-08-04 DIAGNOSIS — E785 Hyperlipidemia, unspecified: Secondary | ICD-10-CM | POA: Diagnosis not present

## 2023-08-04 DIAGNOSIS — Z89611 Acquired absence of right leg above knee: Secondary | ICD-10-CM

## 2023-08-04 DIAGNOSIS — R7303 Prediabetes: Secondary | ICD-10-CM

## 2023-08-04 LAB — POCT GLYCOSYLATED HEMOGLOBIN (HGB A1C): Hemoglobin A1C: 5.7 % — AB (ref 4.0–5.6)

## 2023-08-04 MED ORDER — LOSARTAN POTASSIUM-HCTZ 100-12.5 MG PO TABS: 1.0000 | ORAL_TABLET | Freq: Every day | ORAL | 3 refills | Status: DC

## 2023-08-04 MED ORDER — SILDENAFIL CITRATE 20 MG PO TABS: ORAL_TABLET | ORAL | 5 refills | Status: AC

## 2023-08-04 MED ORDER — ATORVASTATIN CALCIUM 10 MG PO TABS: 10.0000 mg | ORAL_TABLET | Freq: Every day | ORAL | 3 refills | Status: DC

## 2023-08-04 MED ORDER — METOPROLOL SUCCINATE ER 50 MG PO TB24: ORAL_TABLET | ORAL | 3 refills | Status: AC

## 2023-08-04 MED ORDER — ESCITALOPRAM OXALATE 10 MG PO TABS: 10.0000 mg | ORAL_TABLET | Freq: Every day | ORAL | 1 refills | Status: DC

## 2023-08-04 MED ORDER — AMLODIPINE BESYLATE 10 MG PO TABS: 10.0000 mg | ORAL_TABLET | Freq: Every day | ORAL | 3 refills | Status: AC

## 2023-08-04 NOTE — Progress Notes (Addendum)
 Annual Wellness Visit     Patient: Adam [name], Male    DOB: July 14, 1951, 72 y.o.   MRN: [id number]  Subjective  Chief Complaint  Patient presents with   Medicare Wellness    Fasting AWV/CPE no concerns. Used to take lexapro  and thinks he may need to get back on it. Would like some info about Inspire. Has a CPAP but doesn't use. Last sleep study looks like it was in 2003 per chart.    Adam [name] is a 72 y.o. male who presents today for his t,CPE. He reports consuming a general diet. The patient does not participate in regular exercise at present. He generally feels well. He reports sleeping well.  He does have a history of OSA and has tried CPAP in the past but has found it to not be useful.  He is interested in pursuing Inspire.  He would also like to follow-up concerning physical therapy with Robin Waldron.  He is seen regularly for help with his prosthetic.  Apparently his wife is concerned over the fact that he is not ambulating adequately.  His wife is also concerned and he is to a lesser extent his psychological status.  He is willing to go back on Lexapro  which did help him when he is dealing with the amputations from several years ago.  He admits to drinking at least 2 beverages per night.  Does not smoke anymore.  He also notes that his blood pressure in the office is always high.  He apparently did have a blood pressure cuff in the past that was accurate however he does not have  Vision:Within last year     Medications: Outpatient Medications Prior to Visit  Medication Sig Note   Multiple Vitamin (MULTIVITAMIN) tablet Take 1 tablet by mouth daily.    [DISCONTINUED] amLODipine  (NORVASC ) 10 MG tablet Take 1 tablet (10 mg total) by mouth daily.    [DISCONTINUED] atorvastatin  (LIPITOR) 10 MG tablet Take 1 tablet (10 mg total) by mouth daily.    [DISCONTINUED] losartan -hydrochlorothiazide  (HYZAAR) 100-12.5 MG tablet Take 1 tablet by mouth daily.    [DISCONTINUED] metoprolol   succinate (TOPROL -XL) 50 MG 24 hr tablet Take 1 tablet daily with or immediately following a meal.    [DISCONTINUED] sildenafil  (REVATIO ) 20 MG tablet Take up to 5 pills as needed (Patient not taking: Reported on 08/04/2023) 08/04/2023: As needed   No facility-administered medications prior to visit.    No Known Allergies  Patient Care Team: Watson Hacking, MD as PCP - General (Family Medicine) Timothy Ford, MD as Consulting Physician (Orthopedic Surgery) Sterling Eisenmenger, PA-C as Physician Assistant Optho: [facility] Dentist: Dr. [facility] for [facility]  Review of Systems  All other systems reviewed and are negative.       Objective  BP (!) 160/80   Pulse 72   Ht 5\' 9"  (1.753 m)   Wt 177 lb 6.4 oz (80.5 kg)   BMI 26.20 kg/m    Physical Exam Alert and in no distress. Tympanic membranes and canals are normal. Pharyngeal area is normal. Neck is supple without adenopathy or thyromegaly. Cardiac exam shows a regular sinus rhythm with a  grade 1 murmer no gallops. Lungs are clear to auscultation. He has an above-the-knee amputation on the right and he does use a prosthetic.  The prosthetic needs to be adjusted periodically and have a new socks and sleeves to help prevent skin irritation and pressure points but should be  enough   Most recent functional status assessment:    08/04/2023   11:04 AM  In your present state of health, do you have any difficulty performing the following activities:  Hearing? 1  Comment wife thinks it is not, is going to have it checked  Vision? 0  Difficulty concentrating or making decisions? 1  Comment age related memory issues  Walking or climbing stairs? 0  Dressing or bathing? 0  Doing errands, shopping? 0  Preparing Food and eating ? N  Using the Toilet? N  In the past six months, have you accidently leaked urine? N  Do you have problems with loss of bowel control? N  Managing your Medications? N  Managing your  Finances? N  Housekeeping or managing your Housekeeping? N   Most recent fall risk assessment:    08/04/2023   11:00 AM  Fall Risk   Falls in the past year? 0  Number falls in past yr: 0  Injury with Fall? 0  Risk for fall due to : No Fall Risks  Follow up Falls evaluation completed    Most recent depression screenings:    08/04/2023   11:01 AM 07/25/2022   10:30 AM  PHQ 2/9 Scores  PHQ - 2 Score 0 0  PHQ- 9 Score  0   Most recent cognitive screening:    07/25/2022   10:31 AM  6CIT Screen  What Year? 0 points  What month? 0 points  What time? 0 points  Count back from 20 0 points  Months in reverse 0 points  Repeat phrase 0 points  Total Score 0 points   Most recent Audit-C alcohol use screening     No data to display         A score of 3 or more in women, and 4 or more in men indicates increased risk for alcohol abuse, EXCEPT if all of the points are from question 1   Vision/Hearing Screen: No results found.    Results for orders placed or performed in visit on 08/04/23  POCT glycosylated hemoglobin (Hb A1C)  Result Value Ref Range   Hemoglobin A1C 5.7 (A) 4.0 - 5.6 %   HbA1c POC (<> result, manual entry)     HbA1c, POC (prediabetic range)     HbA1c, POC (controlled diabetic range)        Assessment & Plan   Annual wellness visit done today including the all of the following: Reviewed patient's Family Medical History Reviewed and updated list of patient's medical providers Assessment of cognitive impairment was done Assessed patient's functional ability Established a written schedule for health screening services Health Risk Assessent Completed and Reviewed  Exercise Activities and Dietary recommendations  Goals      Patient Stated     07/25/2022, no goals        Immunization History  Administered Date(s) Administered   Fluad Quad(high Dose 65+) 08/01/2019, 07/23/2021, 07/28/2022   Fluad Trivalent(High Dose 65+) 08/04/2023   Influenza, High  Dose Seasonal PF 10/01/2016, 08/10/2017, 08/27/2018, 08/09/2020   Moderna Covid-19 Vaccine Bivalent Booster 52yrs & up 09/04/2021   Moderna Sars-Covid-2 Vaccination 12/20/2019, 01/27/2020, 09/13/2020, 03/04/2021   Pneumococcal Conjugate-13 10/01/2016   Pneumococcal Polysaccharide-23 02/18/2018   Respiratory Syncytial Virus Vaccine,Recomb Aduvanted(Arexvy) 08/09/2022   Tdap 10/05/2014   Unspecified SARS-COV-2 Vaccination 08/09/2022   Zoster Recombinant(Shingrix ) 02/18/2018, 08/27/2018   Zoster, Live 10/05/2014    Health Maintenance  Topic Date Due   Diabetic kidney evaluation - Urine ACR  Never  done   Lung Cancer Screening  Never done   Diabetic kidney evaluation - eGFR measurement  07/29/2023   COVID-19 Vaccine (7 - 2023-24 season) 11/17/2023 (Originally 07/19/2023)   Medicare Annual Wellness (AWV)  05/24/2024   DTaP/Tdap/Td (2 - Td or Tdap) 10/05/2024   Colonoscopy  12/04/2024   Pneumonia Vaccine 31+ Years old  Completed   INFLUENZA VACCINE  Completed   Hepatitis C Screening  Completed   Zoster Vaccines- Shingrix   Completed   HPV VACCINES  Aged Out   FOOT EXAM  Discontinued   HEMOGLOBIN A1C  Discontinued   OPHTHALMOLOGY EXAM  Discontinued     Discussed  Problem List Items Addressed This Visit     Erectile dysfunction due to arterial insufficiency   Relevant Medications   amLODipine  (NORVASC ) 10 MG tablet   atorvastatin  (LIPITOR) 10 MG tablet   losartan -hydrochlorothiazide  (HYZAAR) 100-12.5 MG tablet   metoprolol  succinate (TOPROL -XL) 50 MG 24 hr tablet   sildenafil  (REVATIO ) 20 MG tablet   Hyperlipidemia   Relevant Medications   amLODipine  (NORVASC ) 10 MG tablet   atorvastatin  (LIPITOR) 10 MG tablet   losartan -hydrochlorothiazide  (HYZAAR) 100-12.5 MG tablet   metoprolol  succinate (TOPROL -XL) 50 MG 24 hr tablet   sildenafil  (REVATIO ) 20 MG tablet   Other Relevant Orders   Lipid panel   Hypertension   Relevant Medications   amLODipine  (NORVASC ) 10 MG tablet    atorvastatin  (LIPITOR) 10 MG tablet   losartan -hydrochlorothiazide  (HYZAAR) 100-12.5 MG tablet   metoprolol  succinate (TOPROL -XL) 50 MG 24 hr tablet   sildenafil  (REVATIO ) 20 MG tablet   Other Relevant Orders   CBC with Differential/Platelet   Comprehensive metabolic panel   PAD (peripheral artery disease) (HCC)   Relevant Medications   amLODipine  (NORVASC ) 10 MG tablet   atorvastatin  (LIPITOR) 10 MG tablet   losartan -hydrochlorothiazide  (HYZAAR) 100-12.5 MG tablet   metoprolol  succinate (TOPROL -XL) 50 MG 24 hr tablet   sildenafil  (REVATIO ) 20 MG tablet   Phantom pain after amputation of lower extremity (HCC)   S/P AKA (above knee amputation), right (HCC)   Other Visit Diagnoses     Routine general medical examination at a health care facility    -  Primary   Pre-diabetes       Relevant Orders   POCT glycosylated hemoglobin (Hb A1C) (Completed)   Need for COVID-19 vaccine       Need for influenza vaccination       Relevant Orders   Flu Vaccine Trivalent High Dose (Fluad) (Completed)   Essential hypertension       Relevant Medications   amLODipine  (NORVASC ) 10 MG tablet   atorvastatin  (LIPITOR) 10 MG tablet   losartan -hydrochlorothiazide  (HYZAAR) 100-12.5 MG tablet   metoprolol  succinate (TOPROL -XL) 50 MG 24 hr tablet   sildenafil  (REVATIO ) 20 MG tablet     He is to get his blood pressure cuff measured locally to see if it is accurate.  He is also to use his CPAP regularly so I can get a readout and to see if he would then qualify for inspire.  Strongly encouraged him to reduce his alcohol consumptionDown to 2 or less per night.  He is to leave me a MyChart message in 1 month to let me know how he is doing on the Lexapro .   Ron Cobbs, MD

## 2023-08-04 NOTE — Addendum Note (Signed)
Addended by: Ronnald Nian on: 08/04/2023 01:59 PM   Modules accepted: Orders

## 2023-08-05 LAB — CBC WITH DIFFERENTIAL/PLATELET
Basophils Absolute: 0.1 10*3/uL (ref 0.0–0.2)
Basos: 1 %
EOS (ABSOLUTE): 0.1 10*3/uL (ref 0.0–0.4)
Eos: 2 %
Hematocrit: 45.4 % (ref 37.5–51.0)
Hemoglobin: 15.4 g/dL (ref 13.0–17.7)
Immature Grans (Abs): 0 10*3/uL (ref 0.0–0.1)
Immature Granulocytes: 0 %
Lymphocytes Absolute: 1.6 10*3/uL (ref 0.7–3.1)
Lymphs: 24 %
MCH: 31.1 pg (ref 26.6–33.0)
MCHC: 33.9 g/dL (ref 31.5–35.7)
MCV: 92 fL (ref 79–97)
Monocytes Absolute: 0.6 10*3/uL (ref 0.1–0.9)
Monocytes: 9 %
Neutrophils Absolute: 4.4 10*3/uL (ref 1.4–7.0)
Neutrophils: 64 %
Platelets: 325 10*3/uL (ref 150–450)
RBC: 4.95 x10E6/uL (ref 4.14–5.80)
RDW: 12.8 % (ref 11.6–15.4)
WBC: 6.8 10*3/uL (ref 3.4–10.8)

## 2023-08-05 LAB — COMPREHENSIVE METABOLIC PANEL
ALT: 22 IU/L (ref 0–44)
AST: 23 IU/L (ref 0–40)
Albumin: 4.9 g/dL — ABNORMAL HIGH (ref 3.8–4.8)
Alkaline Phosphatase: 56 IU/L (ref 44–121)
BUN/Creatinine Ratio: 15 (ref 10–24)
BUN: 10 mg/dL (ref 8–27)
Bilirubin Total: 0.4 mg/dL (ref 0.0–1.2)
CO2: 20 mmol/L (ref 20–29)
Calcium: 9.9 mg/dL (ref 8.6–10.2)
Chloride: 102 mmol/L (ref 96–106)
Creatinine, Ser: 0.66 mg/dL — ABNORMAL LOW (ref 0.76–1.27)
Globulin, Total: 2.4 g/dL (ref 1.5–4.5)
Glucose: 110 mg/dL — ABNORMAL HIGH (ref 70–99)
Potassium: 4.3 mmol/L (ref 3.5–5.2)
Sodium: 141 mmol/L (ref 134–144)
Total Protein: 7.3 g/dL (ref 6.0–8.5)
eGFR: 100 mL/min/{1.73_m2} (ref >59)

## 2023-08-05 LAB — LIPID PANEL
Chol/HDL Ratio: 2.6 ratio (ref 0.0–5.0)
Cholesterol, Total: 162 mg/dL (ref 100–199)
HDL: 63 mg/dL (ref >39)
LDL Chol Calc (NIH): 84 mg/dL (ref 0–99)
Triglycerides: 79 mg/dL (ref 0–149)
VLDL Cholesterol Cal: 15 mg/dL (ref 5–40)

## 2023-08-05 MED ORDER — ATORVASTATIN CALCIUM 20 MG PO TABS: 20.0000 mg | ORAL_TABLET | Freq: Every day | ORAL | 3 refills | Status: AC

## 2023-08-05 NOTE — Addendum Note (Signed)
Addended by: Ronnald Nian on: 08/05/2023 01:38 PM   Modules accepted: Orders

## 2023-08-24 ENCOUNTER — Telehealth: Payer: Self-pay | Admitting: Physical Therapy

## 2023-08-25 ENCOUNTER — Other Ambulatory Visit: Payer: Self-pay

## 2023-08-25 ENCOUNTER — Ambulatory Visit: Payer: Medicare HMO | Admitting: Physical Therapy

## 2023-08-25 ENCOUNTER — Encounter: Payer: Self-pay | Admitting: Physical Therapy

## 2023-08-25 DIAGNOSIS — M6249 Contracture of muscle, multiple sites: Secondary | ICD-10-CM

## 2023-08-25 DIAGNOSIS — M25651 Stiffness of right hip, not elsewhere classified: Secondary | ICD-10-CM | POA: Diagnosis not present

## 2023-08-25 DIAGNOSIS — R2689 Other abnormalities of gait and mobility: Secondary | ICD-10-CM | POA: Diagnosis not present

## 2023-08-25 DIAGNOSIS — M6281 Muscle weakness (generalized): Secondary | ICD-10-CM | POA: Diagnosis not present

## 2023-08-25 DIAGNOSIS — R2681 Unsteadiness on feet: Secondary | ICD-10-CM | POA: Diagnosis not present

## 2023-08-25 NOTE — Therapy (Signed)
OUTPATIENT PHYSICAL THERAPY PROSTHETIC EVALUATION   Patient Name: Adam [name] MRN: [id number] DOB:1951-05-04, 72 y.o., male Today's Date: 08/25/2023  PCP: Lorine Bears, MD REFERRING PROVIDER: Lorine Bears, MD  END OF SESSION:  PT End of Session - 08/25/23 1414     Visit Number 1    Number of Visits 7    Date for PT Re-Evaluation 11/23/23    Authorization Type [employer] Medicare HMO/PPO    PT Start Time 1300    PT Stop Time 1414    PT Time Calculation (min) 74 min             Past Medical History:  Diagnosis Date   Gangrene (HCC) 11/22/2015   right great toe   Hypertension    OSA on CPAP    PAD (peripheral artery disease) (HCC)    Peripheral vascular disease (HCC)    Type II diabetes mellitus (HCC) dx'd 11/2015   Past Surgical History:  Procedure Laterality Date   AMPUTATION Right 12/01/2015   Procedure: AMPUTATION RIGHT GREAT;  Surgeon: Fransisco Hertz, MD;  Location: [facility] OR;  Service: Vascular;  Laterality: Right;   AMPUTATION Right 12/16/2015   Procedure: AMPUTATION BELOW KNEE;  Surgeon: Larina Earthly, MD;  Location: [facility] OR;  Service: Vascular;  Laterality: Right;   AMPUTATION Right 12/26/2015   Procedure: RIGHT ABOVE KNEE AMPUTATION;  Surgeon: Larina Earthly, MD;  Location: [facility] OR;  Service: Vascular;  Laterality: Right;   COLONOSCOPY     FEMORAL-POPLITEAL BYPASS GRAFT Right 11/28/2015   Procedure: RIGHT  FEMORAL-POPLITEAL ARTERY BYPASS GRAFT AND CONSTRUCTION OF MILLER CUFF USING 6 MM X 80 CM PROPATEN  GRAFT ;  Surgeon: Fransisco Hertz, MD;  Location: [facility] OR;  Service: Vascular;  Laterality: Right;   INGUINAL HERNIA REPAIR Bilateral 2000   PERIPHERAL VASCULAR CATHETERIZATION N/A 11/26/2015   Procedure: Abdominal Aortogram;  Surgeon: Chuck Hint, MD;  Location: [facility] INVASIVE CV LAB;  Service: Cardiovascular;  Laterality: N/A;   VASECTOMY     Patient Active Problem List   Diagnosis Date Noted   Erectile dysfunction due to arterial insufficiency 07/06/2020   Phantom pain  after amputation of lower extremity (HCC) 01/18/2016   S/P AKA (above knee amputation), right (HCC) 12/29/2015   PAD (peripheral artery disease) (HCC) 12/15/2015   Hyperlipidemia 12/07/2015   Hypertension 12/04/2014    ONSET DATE: 08/04/2023 MD referral to PT  REFERRING DIAG: Z89.611 (ICD-10-CM) - S/P AKA (above knee amputation), right   THERAPY DIAG:  Other abnormalities of gait and mobility  Unsteadiness on feet  Stiffness of right hip, not elsewhere classified  Contracture of muscle, multiple sites  Muscle weakness (generalized)  Rationale for Evaluation and Treatment: Rehabilitation  SUBJECTIVE:   SUBJECTIVE STATEMENT: This 72yo male underwent a right Transfemoral Amputation 12/26/2015 due to vascular issues.  He got new prosthesis on 12/29/2022 with upgraded microprocessor knee with dynamic response foot.   and prosthetist is Banker, CPO. He moved from [place] area to [place], [place] upon retirement over 3 years ago.  He has been having issues with his function with his prosthesis and only feels comfortable seeing me as a prosthetic PT specialist and is going to commute to work on his mobility. His wife is concerned that he does not bend his prosthetic knee.   PERTINENT HISTORY: right AKA 12/26/2015, DM2, HLD, HTN, PAD/PVD, phantom pain  PAIN:  Are you having pain? No  PRECAUTIONS: None  WEIGHT BEARING RESTRICTIONS: No  FALLS: Has patient fallen  in last 6 months? No  LIVING ENVIRONMENT: Lives with: lives with their spouse and small dog who gets under feet Lives in: House   Home layout: One level Stairs: Yes: External: 14 steps; can reach both He lives at [employer].  He has a boat and he has to walk on ramp for floating dock.    OCCUPATION: retired  PLOF: Independent, Independent with household mobility without device, and Independent with community mobility without device  PATIENT GOALS:  wants to improve his prosthetic gait.   OBJECTIVE:   COGNITION: Overall cognitive status: Within functional limits for tasks assessed  MUSCLE LENGTH: Hamstrings: hip 90* with knee extended to Left -43 deg Thomas test: Right -22 deg;   POSTURE: flexed trunk  and weight shift left  LOWER EXTREMITY ROM:  ROM P:passive  A:active Right eval Left eval  Hip flexion    Hip extension    Hip abduction    Hip adduction    Hip internal rotation    Hip external rotation    Knee flexion    Knee extension    Ankle dorsiflexion  -8*  Ankle plantarflexion    Ankle inversion    Ankle eversion     (Blank rows = not tested)  LOWER EXTREMITY MMT:  MMT Right eval Left eval  Hip flexion    Hip extension    Hip abduction 4/5 5/5  Hip adduction    Hip internal rotation    Hip external rotation    Knee flexion    Knee extension    Ankle dorsiflexion    Ankle plantarflexion    Ankle inversion    Ankle eversion    At Evaluation all strength testing is grossly seated and functionally standing / gait. (Blank rows = not tested)  TRANSFERS: Sit to stand: Modified independence requires use of BUEs on armrest to arise but stabilizes without external support Stand to sit: Modified independence using BUEs on armrest   FUNCTIONAL TESTs:  Timed up and go (TUG):  standard 17.00 sec with prosthesis only;  cognitive TUG with prosthesis only 20.75 sec naming ingredients for recipe,  22% increase.   All times and % increase indicates high fall risk.  Berg Balance Scale: 44/56 L-test:  37.21 sec  L-test includes sit/stand chair without armrests walking out 32.8 ft and back with one 90* left turn & one 90* right turn.   [facility] PT Assessment - 08/25/23 1300       Standardized Balance Assessment   Standardized Balance Assessment Berg Balance Test;Timed Up and Go Test      Berg Balance Test   Sit to Stand Able to stand  independently using hands    Standing Unsupported Able to stand safely 2 minutes    Sitting with Back Unsupported but Feet Supported  on Floor or Stool Able to sit safely and securely 2 minutes    Stand to Sit Sits safely with minimal use of hands    Transfers Able to transfer safely, minor use of hands    Standing Unsupported with Eyes Closed Able to stand 10 seconds with supervision    Standing Unsupported with Feet Together Able to place feet together independently and stand 1 minute safely    From Standing, Reach Forward with Outstretched Arm Can reach confidently >25 cm (10")    From Standing Position, Pick up Object from Floor Able to pick up shoe safely and easily    From Standing Position, Turn to Look Behind Over each Shoulder Looks behind  one side only/other side shows less weight shift    Turn 360 Degrees Able to turn 360 degrees safely but slowly    Standing Unsupported, Alternately Place Feet on Step/Stool Able to complete >2 steps/needs minimal assist    Standing Unsupported, One Foot in Front Able to plae foot ahead of the other independently and hold 30 seconds    Standing on One Leg Tries to lift leg/unable to hold 3 seconds but remains standing independently    Total Score 44    Berg comment: Group Information    BERG  < 36 high risk for falls (close to 100%) 46-51 moderate (>50%)   37-45 significant (>80%) 52-55 lower (> 25%)      Timed Up and Go Test   Normal TUG (seconds) 17   17.00 sec with prosthesis only   Cognitive TUG (seconds) 20.75   prosthesis only naming ingredients for recipe,  22% increase from std TUG     Functional Gait  Assessment   Gait assessed  Yes    Gait Level Surface Walks 20 ft, slow speed, abnormal gait pattern, evidence for imbalance or deviates 10-15 in outside of the 12 in walkway width. Requires more than 7 sec to ambulate 20 ft.    Change in Gait Speed Makes only minor adjustments to walking speed, or accomplishes a change in speed with significant gait deviations, deviates 10-15 in outside the 12 in walkway width, or changes speed but loses balance but is able to recover and  continue walking.    Gait with Horizontal Head Turns Performs head turns smoothly with slight change in gait velocity (eg, minor disruption to smooth gait path), deviates 6-10 in outside 12 in walkway width, or uses an assistive device.    Gait with Vertical Head Turns Performs task with slight change in gait velocity (eg, minor disruption to smooth gait path), deviates 6 - 10 in outside 12 in walkway width or uses assistive device    Gait and Pivot Turn Pivot turns safely in greater than 3 sec and stops with no loss of balance, or pivot turns safely within 3 sec and stops with mild imbalance, requires small steps to catch balance.    Step Over Obstacle Is able to step over one shoe box (4.5 in total height) but must slow down and adjust steps to clear box safely. May require verbal cueing.    Gait with Narrow Base of Support Ambulates less than 4 steps heel to toe or cannot perform without assistance.    Gait with Eyes Closed Walks 20 ft, slow speed, abnormal gait pattern, evidence for imbalance, deviates 10-15 in outside 12 in walkway width. Requires more than 9 sec to ambulate 20 ft.    Ambulating Backwards Walks 20 ft, slow speed, abnormal gait pattern, evidence for imbalance, deviates 10-15 in outside 12 in walkway width.    Steps Two feet to a stair, must use rail.    Total Score 12    FGA comment: Max score 30  Interpretation: 25-28 = low risk fall   19-24 = medium risk fall  < 19 = high risk fall              GAIT: Gait pattern: step through pattern, decreased arm swing- Right, decreased step length- Left, decreased stance time- Right, decreased hip/knee flexion- Right, circumduction- Right, Right hip hike, antalgic, abducted- Right, and poor foot clearance- Right  pt has minimal to no prosthetic knee flexion in terminal stance or swing.  Distance walked: >300' Assistive device utilized:  TFA microprocessor prosthesis and None Level of assistance: SBA  / verbal cues Gait velocity:  comfortable / self-selected 1.94 ft/sec and fast pace 2.60 ft/sec Comments: See Functional Gait Assessment above  12/30  STAIRS:  step to pattern with single rail support.   RAMP SBA / verbal cues due to poor knee control  CURB: Modified independence  CARDIOVASCULAR RESPONSE: Functional activity: gait & balance assessment Pre-activity vitals: HR: 77 SpO2: 98% Post-activity vitals: HR: 79 SpO2:  96%   CURRENT PROSTHETIC WEAR ASSESSMENT: 08/25/2023:  Patient is independent with: skin check, residual limb care, prosthetic cleaning, ply sock cleaning, correct ply sock adjustment, proper wear schedule/adjustment, and proper weight-bearing schedule/adjustment Donning prosthesis: Modified independence Doffing prosthesis: Modified independence Prosthetic wear tolerance: reports daily for most of awake hours Prosthetic weight bearing tolerance: >10 minutes with no c/o limb pain Residual limb condition: reports no issues. Prosthetic description: ischial containment socket with flexible inner socket, silicon liner with suction [name] suspension, Rheo microprocessor knee, Flex dynamic response foot;    Assessment with proper height (level pelvis standing) and limb seated correctly in socket. K code/activity level with prosthetic use: Level 3 full community distances with variable cadence    TODAY'S TREATMENT:                                                                                                                             DATE:  08/25/2023 Therex:    HEP instruction/performance c cues for techniques, handout provided.  Trial set performed of each for comprehension and symptom assessment.  See below for exercise list  PATIENT EDUCATION:  Education details: HEP, POC Person educated: Patient Education method: Explanation, Demonstration, Verbal cues, and Handouts Education comprehension: verbalized understanding, returned demonstration, and verbal cues required  HOME EXERCISE  PROGRAM: Access Code: [email or web address] URL: [email or web address] Date: 08/25/2023 Prepared by: Vladimir Faster  Exercises - Seated Hamstring Stretch with Strap  - 1 x daily - 7 x weekly - 1 sets - 3 reps - 30 seconds hold - standing calf stretch with forefoot on small step or brick  - 1 x daily - 7 x weekly - 1 sets - 3 reps - 30 seconds hold - Thomas Stretch on Table  - 1 x daily - 7 x weekly - 1 sets - 3 reps - 30 seconds hold  Stand at sink holding sink right hand and chair back left hand Put a piece of tape on floor at midpoint of sink Stand with right side to sink with right foot (prosthesis) behind the tape or line and step left foot in front of line.  You should have 2-3" between right toes and left heel. Shift weight your pelvis from prosthesis to left foot.  Roll over the prosthetic toes which will bend the prosthetic knee.  Shift back onto prosthesis straightening knee.  Continue for 2-3 minutes working on bending the prosthetic knee.    ASSESSMENT:  CLINICAL IMPRESSION:  Patient is a 72 y.o.male who was seen today for physical therapy evaluation and treatment for prosthetic training with right Transfemoral microprocessor prosthesis.  He has tightness in bilateral hip flexors, left hamstring and left gastroc limiting his mobility.  Berg Balance Test, Timed Up & Go, L-Test and Functional Gait Assessment all indicate high fall risk. Patient has gait deviations increasing fall risk and energy expenditure for gait.  Pt would benefit from skilled PT to improve his safety and functional mobility with his prosthesis.   OBJECTIVE IMPAIRMENTS: Abnormal gait, decreased activity tolerance, decreased knowledge of use of DME, decreased ROM, decreased strength, postural dysfunction, and prosthetic dependency .   ACTIVITY LIMITATIONS: carrying, lifting, standing, stairs, transfers, and locomotion level  PARTICIPATION LIMITATIONS: community activity, yard work, and recreational  activities  PERSONAL FACTORS: Age, Fitness, Past/current experiences, Time since onset of injury/illness/exacerbation, and 3+ comorbidities: see PMH  are also affecting patient's functional outcome.   REHAB POTENTIAL: Good  CLINICAL DECISION MAKING: Evolving/moderate complexity  EVALUATION COMPLEXITY: Moderate   GOALS: Goals reviewed with patient? Yes  SHORT TERM GOALS: Target date: 09/22/2023  Patient demonstrates and verbalizes understanding of Home Exercise Program Baseline: SEE OBJECTIVE DATA Goal status: INITIAL  LONG TERM GOALS: Target date: 11/23/2023  Sharlene Motts Balance Test >/= 50/56 to indicate lower fall risk Baseline: SEE OBJECTIVE DATA Goal status: INITIAL  Timed Up & Go with prosthesis only <13.5 sec and cognitive TUG <15.5sec to indicate lower fall risk.  Baseline: SEE OBJECTIVE DATA Goal status: INITIAL  Functional Gait Assessment >/= 19/30 to indicate lower fall risk Baseline: SEE OBJECTIVE DATA Goal status: INITIAL  Patient ambulates >500' with prosthesis only flexing prosthetic knee >50% of steps independently Baseline: SEE OBJECTIVE DATA Goal status: INITIAL  Patient negotiates ramps, curbs & stairs with single rail with prosthesis only independently. Baseline: SEE OBJECTIVE DATA Goal status: INITIAL  Patient verbalizes & demonstrates understanding of ongoing Home Exercise Program. Baseline: SEE OBJECTIVE DATA Goal status: INITIAL  PLAN:  PT FREQUENCY: 1x/week every 2 weeks  PT DURATION:  90 days  PLANNED INTERVENTIONS: Therapeutic exercises, Therapeutic activity, Neuromuscular re-education, Balance training, Gait training, Patient/Family education, Self Care, Stair training, Vestibular training, Prosthetic training, Re-evaluation, and physical performance testing  PLAN FOR NEXT SESSION: check on HEP, work on prosthetic knee flexion including sitting down, on stairs step-to descent and at sink. Add balance to HEP.   Vladimir Faster, PT,  DPT 08/25/2023, 2:40 PM

## 2023-08-25 NOTE — Patient Instructions (Signed)
Stand at sink holding sink right hand and chair back left hand Put a piece of tape on floor at midpoint of sink Stand with right side to sink with right foot (prosthesis) behind the tape or line and step left foot in front of line.  You should have 2-3" between right toes and left heel. Shift weight your pelvis from prosthesis to left foot.  Roll over the prosthetic toes which will bend the prosthetic knee.  Shift back onto prosthesis straightening knee.  Continue for 2-3 minutes working on bending the prosthetic knee.

## 2023-08-25 NOTE — Telephone Encounter (Signed)
PT returned phone call to patient's wife per her request.  She expressed concerns for how patient is ambulating with his prosthesis.  He is living in [place], [place] and is willing to commute to [place] to see prosthetic specialist.

## 2023-08-27 ENCOUNTER — Other Ambulatory Visit: Payer: Self-pay | Admitting: Family Medicine

## 2023-09-08 ENCOUNTER — Encounter: Payer: Medicare HMO | Admitting: Physical Therapy

## 2023-09-08 ENCOUNTER — Encounter: Payer: Self-pay | Admitting: Physical Therapy

## 2023-09-08 ENCOUNTER — Ambulatory Visit: Payer: Medicare HMO | Admitting: Physical Therapy

## 2023-09-08 DIAGNOSIS — M25651 Stiffness of right hip, not elsewhere classified: Secondary | ICD-10-CM | POA: Diagnosis not present

## 2023-09-08 DIAGNOSIS — M6249 Contracture of muscle, multiple sites: Secondary | ICD-10-CM | POA: Diagnosis not present

## 2023-09-08 DIAGNOSIS — R2689 Other abnormalities of gait and mobility: Secondary | ICD-10-CM

## 2023-09-08 DIAGNOSIS — R2681 Unsteadiness on feet: Secondary | ICD-10-CM | POA: Diagnosis not present

## 2023-09-08 DIAGNOSIS — M6281 Muscle weakness (generalized): Secondary | ICD-10-CM

## 2023-09-08 NOTE — Therapy (Signed)
OUTPATIENT PHYSICAL THERAPY PROSTHETIC TREATMENT   Patient Name: Adam [name] MRN: [id number] DOB:[birth year], 72 y.o., male Today's Date: 09/08/2023  PCP: Lorine Bears, MD REFERRING PROVIDER: Lorine Bears, MD  END OF SESSION:  PT End of Session - 09/08/23 1344     Visit Number 2    Number of Visits 7    Date for PT Re-Evaluation 11/23/23    Authorization Type [employer] Medicare HMO/PPO    PT Start Time 1345    PT Stop Time 1508    PT Time Calculation (min) 83 min              Past Medical History:  Diagnosis Date   Gangrene (HCC) 11/22/2015   right great toe   Hypertension    OSA on CPAP    PAD (peripheral artery disease) (HCC)    Peripheral vascular disease (HCC)    Type II diabetes mellitus (HCC) dx'd 11/2015   Past Surgical History:  Procedure Laterality Date   AMPUTATION Right 12/01/2015   Procedure: AMPUTATION RIGHT GREAT;  Surgeon: Fransisco Hertz, MD;  Location: [facility] OR;  Service: Vascular;  Laterality: Right;   AMPUTATION Right 12/16/2015   Procedure: AMPUTATION BELOW KNEE;  Surgeon: Larina Earthly, MD;  Location: [facility] OR;  Service: Vascular;  Laterality: Right;   AMPUTATION Right 12/26/2015   Procedure: RIGHT ABOVE KNEE AMPUTATION;  Surgeon: Larina Earthly, MD;  Location: [facility] OR;  Service: Vascular;  Laterality: Right;   COLONOSCOPY     FEMORAL-POPLITEAL BYPASS GRAFT Right 11/28/2015   Procedure: RIGHT  FEMORAL-POPLITEAL ARTERY BYPASS GRAFT AND CONSTRUCTION OF MILLER CUFF USING 6 MM X 80 CM PROPATEN  GRAFT ;  Surgeon: Fransisco Hertz, MD;  Location: [facility] OR;  Service: Vascular;  Laterality: Right;   INGUINAL HERNIA REPAIR Bilateral 2000   PERIPHERAL VASCULAR CATHETERIZATION N/A 11/26/2015   Procedure: Abdominal Aortogram;  Surgeon: Chuck Hint, MD;  Location: [facility] INVASIVE CV LAB;  Service: Cardiovascular;  Laterality: N/A;   VASECTOMY     Patient Active Problem List   Diagnosis Date Noted   Erectile dysfunction due to arterial insufficiency 07/06/2020   Phantom pain  after amputation of lower extremity (HCC) 01/18/2016   S/P AKA (above knee amputation), right (HCC) 12/29/2015   PAD (peripheral artery disease) (HCC) 12/15/2015   Hyperlipidemia 12/07/2015   Hypertension 12/04/2014    ONSET DATE: 08/04/2023 MD referral to PT  REFERRING DIAG: Z89.611 (ICD-10-CM) - S/P AKA (above knee amputation), right   THERAPY DIAG:  Other abnormalities of gait and mobility  Unsteadiness on feet  Stiffness of right hip, not elsewhere classified  Contracture of muscle, multiple sites  Muscle weakness (generalized)  Rationale for Evaluation and Treatment: Rehabilitation  SUBJECTIVE:   SUBJECTIVE STATEMENT:  He has been doing the stretches.  No falls. He almost tripped over cord stepping over it with prosthesis.   PERTINENT HISTORY: right AKA 12/26/2015, DM2, HLD, HTN, PAD/PVD, phantom pain  PAIN:  Are you having pain? No  PRECAUTIONS: None  WEIGHT BEARING RESTRICTIONS: No  FALLS: Has patient fallen in last 6 months? No  LIVING ENVIRONMENT: Lives with: lives with their spouse and small dog who gets under feet Lives in: House   Home layout: One level Stairs: Yes: External: 14 steps; can reach both He lives at [employer].  He has a boat and he has to walk on ramp for floating dock.    OCCUPATION: retired  PLOF: Independent, Independent with household mobility without device, and Independent with community  mobility without device  PATIENT GOALS:  wants to improve his prosthetic gait.   OBJECTIVE:  COGNITION: Overall cognitive status: 08/25/2023 Eval: Within functional limits for tasks assessed  MUSCLE LENGTH: 08/25/2023 Eval: Hamstrings: hip 90* with knee extended to Left -43 deg Thomas test: Right -22 deg;   POSTURE: 08/25/2023 Eval:  flexed trunk  and weight shift left  LOWER EXTREMITY ROM:  ROM P:passive  A:active Right eval Left eval  Hip flexion    Hip extension    Hip abduction    Hip adduction    Hip internal rotation    Hip external  rotation    Knee flexion    Knee extension    Ankle dorsiflexion  -8*  Ankle plantarflexion    Ankle inversion    Ankle eversion     (Blank rows = not tested)  LOWER EXTREMITY MMT:  MMT Right eval Left eval  Hip flexion    Hip extension    Hip abduction 4/5 5/5  Hip adduction    Hip internal rotation    Hip external rotation    Knee flexion    Knee extension    Ankle dorsiflexion    Ankle plantarflexion    Ankle inversion    Ankle eversion    At Evaluation all strength testing is grossly seated and functionally standing / gait. (Blank rows = not tested)  TRANSFERS: 08/25/2023 Eval: Sit to stand: Modified independence requires use of BUEs on armrest to arise but stabilizes without external support Stand to sit: Modified independence using BUEs on armrest   FUNCTIONAL TESTs:  08/25/2023 Eval: Timed up and go (TUG):  standard 17.00 sec with prosthesis only;  cognitive TUG with prosthesis only 20.75 sec naming ingredients for recipe,  22% increase.   All times and % increase indicates high fall risk.  Berg Balance Scale: 44/56 L-test:  37.21 sec  L-test includes sit/stand chair without armrests walking out 32.8 ft and back with one 90* left turn & one 90* right turn.      GAIT: 08/25/2023 Eval: Gait pattern: step through pattern, decreased arm swing- Right, decreased step length- Left, decreased stance time- Right, decreased hip/knee flexion- Right, circumduction- Right, Right hip hike, antalgic, abducted- Right, and poor foot clearance- Right  pt has minimal to no prosthetic knee flexion in terminal stance or swing.  Distance walked: >300' Assistive device utilized:  TFA microprocessor prosthesis and None Level of assistance: SBA  / verbal cues Gait velocity: comfortable / self-selected 1.94 ft/sec and fast pace 2.60 ft/sec Comments: See Functional Gait Assessment above  12/30  STAIRS:  08/25/2023 Eval:   step to pattern with single rail support.   RAMP 08/25/2023 Eval:  SBA / verbal cues due to poor knee control  CURB: 08/25/2023 Eval:  Modified independence  CARDIOVASCULAR RESPONSE: 08/25/2023 Eval: Functional activity: gait & balance assessment Pre-activity vitals: HR: 77 SpO2: 98% Post-activity vitals: HR: 79 SpO2:  96%   CURRENT PROSTHETIC WEAR ASSESSMENT: 08/25/2023:  Patient is independent with: skin check, residual limb care, prosthetic cleaning, ply sock cleaning, correct ply sock adjustment, proper wear schedule/adjustment, and proper weight-bearing schedule/adjustment Donning prosthesis: Modified independence Doffing prosthesis: Modified independence Prosthetic wear tolerance: reports daily for most of awake hours Prosthetic weight bearing tolerance: >10 minutes with no c/o limb pain Residual limb condition: reports no issues. Prosthetic description: ischial containment socket with flexible inner socket, silicon liner with suction ring suspension, Rheo microprocessor knee, Flex dynamic response foot;    Assessment with proper height (level pelvis  standing) and limb seated correctly in socket. K code/activity level with prosthetic use: Level 3 full community distances with variable cadence    TODAY'S TREATMENT:                                                                                                                             DATE:  09/08/2023: Prosthetic Training with Transfemoral Microprocessor Prosthesis: Pt educated with demo & verbal cues on sitting with level pelvis with pillow under left side.  Also having prosthetist add a trouser pad to back of socket may help stop socket rotation on limb.  Pt verbalized understanding.  Patient's prosthesis is 1/2" too short.  PT recommended having prosthetist adjust height and using an Adjust-a-Lift in left shoe to compensate slowly for height adjustment.  Pt verbalized understanding.  See HEP below for pre-gait activities at sink for prosthetic control including terminal stance prosthetic  knee flexion, advancing with pelvis, weight shift over prosthesis in stance and equal step through pattern. Pt performed 30 reps ea using mirror, demo, verbal & tactile cues.  Sitting down PT demo & verbal cues on engaging prosthetic knee with flexion for descent.  Using target to keep pelvis midline instead of shifting left. Pt performed 20 reps with PT cues.  Stairs descend riding prosthetic hydraulics - PT demo & verbal cues on technique - pt descended 28 steps with 2 rails with cues.   Therapeutic exercise: PT reviewed HEP of stretches: LLE hamstring stretch 30 sec hold 2 reps, Thomas hip flexor stretch 2 reps BLEs 30 reps,  standing LLE gastroc stretch 2 reps 30 sec. Pt required correction cues from PT.    08/25/2023 Therex:    HEP instruction/performance c cues for techniques, handout provided.  Trial set performed of each for comprehension and symptom assessment.  See below for exercise list  PATIENT EDUCATION:  Education details: HEP, POC Person educated: Patient Education method: Explanation, Demonstration, Verbal cues, and Handouts Education comprehension: verbalized understanding, returned demonstration, and verbal cues required  HOME EXERCISE PROGRAM: Access Code: [email or web address] URL: [email or web address] Date: 08/25/2023 Prepared by: Vladimir Faster  Exercises - Seated Hamstring Stretch with Strap  - 1 x daily - 7 x weekly - 1 sets - 3 reps - 30 seconds hold - standing calf stretch with forefoot on small step or brick  - 1 x daily - 7 x weekly - 1 sets - 3 reps - 30 seconds hold - Thomas Stretch on Table  - 1 x daily - 7 x weekly - 1 sets - 3 reps - 30 seconds hold   Stand at [employer] (on your right).  Keep hips fairly close to counter top (like 2" at most) and 2 chair backs on left hand.    Stand with sound limb forward step. Line in front of prosthesis & place left foot over the line.  Step 1: Start with weight on prosthesis. Weight shift forward to  left leg keeping left knee  straight without locking it (don't squat!!). Your right "headlight" on your hip should come forward with the weight shift. Prosthetic knee should bend by rolling over the prosthetic toe. Keep prosthetic toe on ground.  Repeat step 1 for 20-30 reps.  Step 2: Bring prosthesis forward by bringing right headlight forward & hip flexion (moving leg straight forward-not out to the side-think don't hit the counter top) "placing heel" on floor. Repeat step 1  (freeze for 2-3 seconds) + step 2 for 20-30 reps.   Step 3: Weight shift forward so pelvis (hips) over prosthesis & left heel lifts up & knee is flexing. Maintain left toe on ground. Repeat step 1 (freeze for 2-3 seconds) + step 2  (freeze for 2-3 seconds) +  step 3 for 20-30 reps  Step 4: Step left foot forward stepping over imaginary line (place heel of your left foot past the toe of the prosthesis). Repeat step 1  (freeze for 2-3 seconds) + step 2  (freeze for 2-3 seconds) + step 3  (freeze for 2-3 seconds) + 4 for 20-30 reps  *Keep an eye on your foot placement and adjust back to starting position as needed.    ASSESSMENT:  CLINICAL IMPRESSION: Patient appears to understand updated HEP including pre-gait at sink, sitting down engaging prosthesis and descending stairs riding prosthetic hydraulic knee.  His prosthesis is 1/2" too short which can limit knee flexion for swing.    OBJECTIVE IMPAIRMENTS: Abnormal gait, decreased activity tolerance, decreased knowledge of use of DME, decreased ROM, decreased strength, postural dysfunction, and prosthetic dependency .   ACTIVITY LIMITATIONS: carrying, lifting, standing, stairs, transfers, and locomotion level  PARTICIPATION LIMITATIONS: community activity, yard work, and recreational activities  PERSONAL FACTORS: Age, Fitness, Past/current experiences, Time since onset of injury/illness/exacerbation, and 3+ comorbidities: see PMH  are also affecting patient's functional  outcome.   REHAB POTENTIAL: Good  CLINICAL DECISION MAKING: Evolving/moderate complexity  EVALUATION COMPLEXITY: Moderate   GOALS: Goals reviewed with patient? Yes  SHORT TERM GOALS: Target date: 09/22/2023  Patient demonstrates and verbalizes understanding of Home Exercise Program Baseline: SEE OBJECTIVE DATA Goal status: Ongoing 09/08/2023  LONG TERM GOALS: Target date: 11/23/2023  Sharlene Motts Balance Test >/= 50/56 to indicate lower fall risk Baseline: SEE OBJECTIVE DATA Goal status: Ongoing 09/08/2023  Timed Up & Go with prosthesis only <13.5 sec and cognitive TUG <15.5sec to indicate lower fall risk.  Baseline: SEE OBJECTIVE DATA Goal status: Ongoing 09/08/2023  Functional Gait Assessment >/= 19/30 to indicate lower fall risk Baseline: SEE OBJECTIVE DATA Goal status:   Ongoing 09/08/2023  Patient ambulates >500' with prosthesis only flexing prosthetic knee >50% of steps independently Baseline: SEE OBJECTIVE DATA Goal status: Ongoing 09/08/2023  Patient negotiates ramps, curbs & stairs with single rail with prosthesis only independently. Baseline: SEE OBJECTIVE DATA Goal status: Ongoing 09/08/2023  Patient verbalizes & demonstrates understanding of ongoing Home Exercise Program. Baseline: SEE OBJECTIVE DATA Goal status: Ongoing 09/08/2023  PLAN:  PT FREQUENCY: 1x/week every 2 weeks  PT DURATION:  90 days  PLANNED INTERVENTIONS: Therapeutic exercises, Therapeutic activity, Neuromuscular re-education, Balance training, Gait training, Patient/Family education, Self Care, Stair training, Vestibular training, Prosthetic training, Re-evaluation, and physical performance testing  PLAN FOR NEXT SESSION:   check on updated HEP, Add balance to HEP. Check if he saw prosthetist   Vladimir Faster, PT, DPT 09/08/2023, 4:34 PM

## 2023-09-08 NOTE — Patient Instructions (Signed)
Stand at [employer] (on your right).  Keep hips fairly close to counter top (like 2" at most) and 2 chair backs on left hand.     Stand with sound limb forward step. Line in front of prosthesis & place left foot over the line.   Step 1: Start with weight on prosthesis. Weight shift forward to left leg keeping left knee straight without locking it (don't squat!!). Your right "headlight" on your hip should come forward with the weight shift. Prosthetic knee should bend by rolling over the prosthetic toe. Keep prosthetic toe on ground.  Repeat step 1 for 20-30 reps.   Step 2: Bring prosthesis forward by bringing right headlight forward & hip flexion (moving leg straight forward-not out to the side-think don't hit the counter top) "placing heel" on floor. Repeat step 1  (freeze for 2-3 seconds) + step 2 for 20-30 reps.    Step 3: Weight shift forward so pelvis (hips) over prosthesis & left heel lifts up & knee is flexing. Maintain left toe on ground. Repeat step 1 (freeze for 2-3 seconds) + step 2  (freeze for 2-3 seconds) +  step 3 for 20-30 reps   Step 4: Step left foot forward stepping over imaginary line (place heel of your left foot past the toe of the prosthesis). Repeat step 1  (freeze for 2-3 seconds) + step 2  (freeze for 2-3 seconds) + step 3  (freeze for 2-3 seconds) + 4 for 20-30 reps   *Keep an eye on your foot placement and adjust back to starting position as needed.

## 2023-09-22 ENCOUNTER — Ambulatory Visit: Payer: Medicare HMO | Admitting: Physical Therapy

## 2023-09-22 ENCOUNTER — Encounter: Payer: Medicare HMO | Admitting: Physical Therapy

## 2023-09-22 ENCOUNTER — Encounter: Payer: Self-pay | Admitting: Physical Therapy

## 2023-09-22 DIAGNOSIS — M6249 Contracture of muscle, multiple sites: Secondary | ICD-10-CM

## 2023-09-22 DIAGNOSIS — R2689 Other abnormalities of gait and mobility: Secondary | ICD-10-CM | POA: Diagnosis not present

## 2023-09-22 DIAGNOSIS — R2681 Unsteadiness on feet: Secondary | ICD-10-CM

## 2023-09-22 DIAGNOSIS — M6281 Muscle weakness (generalized): Secondary | ICD-10-CM | POA: Diagnosis not present

## 2023-09-22 DIAGNOSIS — M25651 Stiffness of right hip, not elsewhere classified: Secondary | ICD-10-CM | POA: Diagnosis not present

## 2023-09-22 NOTE — Therapy (Signed)
OUTPATIENT PHYSICAL THERAPY PROSTHETIC TREATMENT   Patient Name: Adam [name] MRN: [id number] DOB:07/23/1951, 72 y.o., male Today's Date: 09/22/2023  PCP: Lorine Bears, MD REFERRING PROVIDER: Lorine Bears, MD  END OF SESSION:  PT End of Session - 09/22/23 1302     Visit Number 3    Number of Visits 7    Date for PT Re-Evaluation 11/23/23    Authorization Type [employer] Medicare HMO/PPO    PT Start Time 1301    PT Stop Time 1415    PT Time Calculation (min) 74 min               Past Medical History:  Diagnosis Date   Gangrene (HCC) 11/22/2015   right great toe   Hypertension    OSA on CPAP    PAD (peripheral artery disease) (HCC)    Peripheral vascular disease (HCC)    Type II diabetes mellitus (HCC) dx'd 11/2015   Past Surgical History:  Procedure Laterality Date   AMPUTATION Right 12/01/2015   Procedure: AMPUTATION RIGHT GREAT;  Surgeon: Fransisco Hertz, MD;  Location: [facility] OR;  Service: Vascular;  Laterality: Right;   AMPUTATION Right 12/16/2015   Procedure: AMPUTATION BELOW KNEE;  Surgeon: Larina Earthly, MD;  Location: [facility] OR;  Service: Vascular;  Laterality: Right;   AMPUTATION Right 12/26/2015   Procedure: RIGHT ABOVE KNEE AMPUTATION;  Surgeon: Larina Earthly, MD;  Location: [facility] OR;  Service: Vascular;  Laterality: Right;   COLONOSCOPY     FEMORAL-POPLITEAL BYPASS GRAFT Right 11/28/2015   Procedure: RIGHT  FEMORAL-POPLITEAL ARTERY BYPASS GRAFT AND CONSTRUCTION OF MILLER CUFF USING 6 MM X 80 CM PROPATEN  GRAFT ;  Surgeon: Fransisco Hertz, MD;  Location: [facility] OR;  Service: Vascular;  Laterality: Right;   INGUINAL HERNIA REPAIR Bilateral 2000   PERIPHERAL VASCULAR CATHETERIZATION N/A 11/26/2015   Procedure: Abdominal Aortogram;  Surgeon: Chuck Hint, MD;  Location: [facility] INVASIVE CV LAB;  Service: Cardiovascular;  Laterality: N/A;   VASECTOMY     Patient Active Problem List   Diagnosis Date Noted   Erectile dysfunction due to arterial insufficiency 07/06/2020   Phantom pain  after amputation of lower extremity (HCC) 01/18/2016   S/P AKA (above knee amputation), right (HCC) 12/29/2015   PAD (peripheral artery disease) (HCC) 12/15/2015   Hyperlipidemia 12/07/2015   Hypertension 12/04/2014    ONSET DATE: 08/04/2023 MD referral to PT  REFERRING DIAG: Z89.611 (ICD-10-CM) - S/P AKA (above knee amputation), right   THERAPY DIAG:  Other abnormalities of gait and mobility  Unsteadiness on feet  Stiffness of right hip, not elsewhere classified  Contracture of muscle, multiple sites  Muscle weakness (generalized)  Rationale for Evaluation and Treatment: Rehabilitation  SUBJECTIVE:   SUBJECTIVE STATEMENT:  He saw prosthetist this morning who raised prosthesis 1/2" and aligned knee.  His prosthetic ankle is defective and prosthetist requested replacement.  He bought Adjust-a-Lift for left shoe like PT recommended.   PERTINENT HISTORY: right AKA 12/26/2015, DM2, HLD, HTN, PAD/PVD, phantom pain  PAIN:  Are you having pain? No  PRECAUTIONS: None  WEIGHT BEARING RESTRICTIONS: No  FALLS: Has patient fallen in last 6 months? No  LIVING ENVIRONMENT: Lives with: lives with their spouse and small dog who gets under feet Lives in: House   Home layout: One level Stairs: Yes: External: 14 steps; can reach both He lives at [employer].  He has a boat and he has to walk on ramp for floating dock.    OCCUPATION:  retired  PLOF: Independent, Independent with household mobility without device, and Independent with community mobility without device  PATIENT GOALS:  wants to improve his prosthetic gait.   OBJECTIVE:  COGNITION: Overall cognitive status: 08/25/2023 Eval: Within functional limits for tasks assessed  MUSCLE LENGTH: 08/25/2023 Eval: Hamstrings: hip 90* with knee extended to Left -43 deg Thomas test: Right -22 deg;   POSTURE: 08/25/2023 Eval:  flexed trunk  and weight shift left  LOWER EXTREMITY ROM:  ROM P:passive  A:active Right eval Left eval  Hip  flexion    Hip extension    Hip abduction    Hip adduction    Hip internal rotation    Hip external rotation    Knee flexion    Knee extension    Ankle dorsiflexion  -8*  Ankle plantarflexion    Ankle inversion    Ankle eversion     (Blank rows = not tested)  LOWER EXTREMITY MMT:  MMT Right eval Left eval  Hip flexion    Hip extension    Hip abduction 4/5 5/5  Hip adduction    Hip internal rotation    Hip external rotation    Knee flexion    Knee extension    Ankle dorsiflexion    Ankle plantarflexion    Ankle inversion    Ankle eversion    At Evaluation all strength testing is grossly seated and functionally standing / gait. (Blank rows = not tested)  TRANSFERS: 08/25/2023 Eval: Sit to stand: Modified independence requires use of BUEs on armrest to arise but stabilizes without external support Stand to sit: Modified independence using BUEs on armrest   FUNCTIONAL TESTs:  08/25/2023 Eval: Timed up and go (TUG):  standard 17.00 sec with prosthesis only;  cognitive TUG with prosthesis only 20.75 sec naming ingredients for recipe,  22% increase.   All times and % increase indicates high fall risk.  Berg Balance Scale: 44/56 L-test:  37.21 sec  L-test includes sit/stand chair without armrests walking out 32.8 ft and back with one 90* left turn & one 90* right turn.      GAIT: 08/25/2023 Eval: Gait pattern: step through pattern, decreased arm swing- Right, decreased step length- Left, decreased stance time- Right, decreased hip/knee flexion- Right, circumduction- Right, Right hip hike, antalgic, abducted- Right, and poor foot clearance- Right  pt has minimal to no prosthetic knee flexion in terminal stance or swing.  Distance walked: >300' Assistive device utilized:  TFA microprocessor prosthesis and None Level of assistance: SBA  / verbal cues Gait velocity: comfortable / self-selected 1.94 ft/sec and fast pace 2.60 ft/sec Comments: See Functional Gait Assessment above   12/30  STAIRS:  08/25/2023 Eval:   step to pattern with single rail support.   RAMP 08/25/2023 Eval: SBA / verbal cues due to poor knee control  CURB: 08/25/2023 Eval:  Modified independence  CARDIOVASCULAR RESPONSE: 08/25/2023 Eval: Functional activity: gait & balance assessment Pre-activity vitals: HR: 77 SpO2: 98% Post-activity vitals: HR: 79 SpO2:  96%   CURRENT PROSTHETIC WEAR ASSESSMENT: 08/25/2023:  Patient is independent with: skin check, residual limb care, prosthetic cleaning, ply sock cleaning, correct ply sock adjustment, proper wear schedule/adjustment, and proper weight-bearing schedule/adjustment Donning prosthesis: Modified independence Doffing prosthesis: Modified independence Prosthetic wear tolerance: reports daily for most of awake hours Prosthetic weight bearing tolerance: >10 minutes with no c/o limb pain Residual limb condition: reports no issues. Prosthetic description: ischial containment socket with flexible inner socket, silicon liner with suction ring suspension, Rheo microprocessor  knee, Flex dynamic response foot;    Assessment with proper height (level pelvis standing) and limb seated correctly in socket. K code/activity level with prosthetic use: Level 3 full community distances with variable cadence    TODAY'S TREATMENT:                                                                                                                             DATE:  09/22/2023: Prosthetic Training with Transfemoral Microprocessor Prosthesis: PT recommended using Adjust-a-lift with 2 of 3 layers in left shoe when he is not focusing fully on prosthetic knee flexion like when he is doing exercises at sink or focused gait.  This is because with 1/2" change to prosthesis height he may catch the toe &/or his back hurt as needs slow change.  Plan will be to reduce the Adjust-a-lift at each PT appt (every 2 weeks) until no lift so his legs are same height.  Pt verbalized  understanding.  No lift with below activities: PT reviewed recommendation with demo, tactile & verbal cues for pillow or folded towel under left pelvis / thigh when sitting >10 min.  Pt verbalized understanding. Pre-gait in //bars using mirror for visual feedback, tactile, verbal & demo cues. 1-3.prosthesis in terminal stance position - 1)wt shift RLE to LLE & pelvic rotation flexing prosthetic knee rolling over toes.  2)performed 1 then advanced prosthesis including completing pelvic rotation. 3)while performing 2 moving contralateral UE forward to touch target.  4-6 positioned prosthesis in initial contact position - 4)wt shift LLE to RLE so COM is directly over prosthesis with left heel raise/knee flexion.  5)complete 4 then stepping LLE forward with left pelvic rotation  6)added reaching contralateral UE forward to target while stepping (#5).   Progressed pre-gait to walking in //bars "freezing" after each portion (above 1, 2, 4, 5).  Pt amb 75' X 4 with HHA so can slow gait to focus on prosthesis terminal stance knee flexion then advancing prosthesis. Treadmill - PT demo & verbal cues on safety with use.  Pt amb 0. for 2 min with PT manual / tactile cues (PT straddling belt standing behind pt).  Focus on terminal stance prosthetic knee flexion then stepping prosthesis forward.  Pt requires further treadmill training prior to using outside of PT.     09/08/2023: Prosthetic Training with Transfemoral Microprocessor Prosthesis: Pt educated with demo & verbal cues on sitting with level pelvis with pillow under left side.  Also having prosthetist add a trouser pad to back of socket may help stop socket rotation on limb.  Pt verbalized understanding.  Patient's prosthesis is 1/2" too short.  PT recommended having prosthetist adjust height and using an Adjust-a-Lift in left shoe to compensate slowly for height adjustment.  Pt verbalized understanding.  See HEP below for pre-gait activities at sink  for prosthetic control including terminal stance prosthetic knee flexion, advancing with pelvis, weight shift over prosthesis in stance and equal step through pattern.  Pt performed 30 reps ea using mirror, demo, verbal & tactile cues.  Sitting down PT demo & verbal cues on engaging prosthetic knee with flexion for descent.  Using target to keep pelvis midline instead of shifting left. Pt performed 20 reps with PT cues.  Stairs descend riding prosthetic hydraulics - PT demo & verbal cues on technique - pt descended 28 steps with 2 rails with cues.   Therapeutic exercise: PT reviewed HEP of stretches: LLE hamstring stretch 30 sec hold 2 reps, Thomas hip flexor stretch 2 reps BLEs 30 reps,  standing LLE gastroc stretch 2 reps 30 sec. Pt required correction cues from PT.    08/25/2023 Therex:    HEP instruction/performance c cues for techniques, handout provided.  Trial set performed of each for comprehension and symptom assessment.  See below for exercise list  PATIENT EDUCATION:  Education details: HEP, POC Person educated: Patient Education method: Explanation, Demonstration, Verbal cues, and Handouts Education comprehension: verbalized understanding, returned demonstration, and verbal cues required  HOME EXERCISE PROGRAM: Access Code: [email or web address] URL: [email or web address] Date: 08/25/2023 Prepared by: Vladimir Faster  Exercises - Seated Hamstring Stretch with Strap  - 1 x daily - 7 x weekly - 1 sets - 3 reps - 30 seconds hold - standing calf stretch with forefoot on small step or brick  - 1 x daily - 7 x weekly - 1 sets - 3 reps - 30 seconds hold - Thomas Stretch on Table  - 1 x daily - 7 x weekly - 1 sets - 3 reps - 30 seconds hold   Stand at [employer] (on your right).  Keep hips fairly close to counter top (like 2" at most) and 2 chair backs on left hand.    Stand with sound limb forward step. Line in front of prosthesis & place left foot over the line.  Step 1:  Start with weight on prosthesis. Weight shift forward to left leg keeping left knee straight without locking it (don't squat!!). Your right "headlight" on your hip should come forward with the weight shift. Prosthetic knee should bend by rolling over the prosthetic toe. Keep prosthetic toe on ground.  Repeat step 1 for 20-30 reps.  Step 2: Bring prosthesis forward by bringing right headlight forward & hip flexion (moving leg straight forward-not out to the side-think don't hit the counter top) "placing heel" on floor. Repeat step 1  (freeze for 2-3 seconds) + step 2 for 20-30 reps.   Step 3: Weight shift forward so pelvis (hips) over prosthesis & left heel lifts up & knee is flexing. Maintain left toe on ground. Repeat step 1 (freeze for 2-3 seconds) + step 2  (freeze for 2-3 seconds) +  step 3 for 20-30 reps  Step 4: Step left foot forward stepping over imaginary line (place heel of your left foot past the toe of the prosthesis). Repeat step 1  (freeze for 2-3 seconds) + step 2  (freeze for 2-3 seconds) + step 3  (freeze for 2-3 seconds) + 4 for 20-30 reps  *Keep an eye on your foot placement and adjust back to starting position as needed.    ASSESSMENT:  CLINICAL IMPRESSION:  patient saw prosthetist prior to PT with 1/2" prosthesis lengthening. This improved patient's ability to flex prosthetic knee in terminal stance when focused. Patient improved prosthetic gait with PT cueing & focus but needs more training for carryover.  If we can get him safe with treadmill, then the repetition created with treadmill  may help carryover. Pt continues to benefit skilled PT.   OBJECTIVE IMPAIRMENTS: Abnormal gait, decreased activity tolerance, decreased knowledge of use of DME, decreased ROM, decreased strength, postural dysfunction, and prosthetic dependency .   ACTIVITY LIMITATIONS: carrying, lifting, standing, stairs, transfers, and locomotion level  PARTICIPATION LIMITATIONS: community activity, yard  work, and recreational activities  PERSONAL FACTORS: Age, Fitness, Past/current experiences, Time since onset of injury/illness/exacerbation, and 3+ comorbidities: see PMH  are also affecting patient's functional outcome.   REHAB POTENTIAL: Good  CLINICAL DECISION MAKING: Evolving/moderate complexity  EVALUATION COMPLEXITY: Moderate   GOALS: Goals reviewed with patient? Yes  SHORT TERM GOALS: Target date: 09/22/2023  Patient demonstrates and verbalizes understanding of Home Exercise Program Baseline: SEE OBJECTIVE DATA Goal status: MET 09/22/2023  LONG TERM GOALS: Target date: 11/23/2023  Berg Balance Test >/= 50/56 to indicate lower fall risk Baseline: SEE OBJECTIVE DATA Goal status: Ongoing 09/08/2023  Timed Up & Go with prosthesis only <13.5 sec and cognitive TUG <15.5sec to indicate lower fall risk.  Baseline: SEE OBJECTIVE DATA Goal status: Ongoing 09/08/2023  Functional Gait Assessment >/= 19/30 to indicate lower fall risk Baseline: SEE OBJECTIVE DATA Goal status:   Ongoing 09/08/2023  Patient ambulates >500' with prosthesis only flexing prosthetic knee >50% of steps independently Baseline: SEE OBJECTIVE DATA Goal status: Ongoing 09/08/2023  Patient negotiates ramps, curbs & stairs with single rail with prosthesis only independently. Baseline: SEE OBJECTIVE DATA Goal status: Ongoing 09/08/2023  Patient verbalizes & demonstrates understanding of ongoing Home Exercise Program. Baseline: SEE OBJECTIVE DATA Goal status: Ongoing 09/08/2023  PLAN:  PT FREQUENCY: 1x/week every 2 weeks  PT DURATION:  90 days  PLANNED INTERVENTIONS: Therapeutic exercises, Therapeutic activity, Neuromuscular re-education, Balance training, Gait training, Patient/Family education, Self Care, Stair training, Vestibular training, Prosthetic training, Re-evaluation, and physical performance testing  PLAN FOR NEXT SESSION:  reduce lift 1 layer,  treadmill with focus on prosthetic knee flexion  terminal stance, prosthetic gait, stairs using prosthetic hydraulics.    Vladimir Faster, PT, DPT 09/22/2023, 2:41 PM

## 2023-10-06 ENCOUNTER — Encounter: Payer: Self-pay | Admitting: Physical Therapy

## 2023-10-06 ENCOUNTER — Ambulatory Visit: Payer: Medicare HMO | Admitting: Physical Therapy

## 2023-10-06 ENCOUNTER — Encounter: Payer: Medicare HMO | Admitting: Physical Therapy

## 2023-10-06 DIAGNOSIS — M25651 Stiffness of right hip, not elsewhere classified: Secondary | ICD-10-CM

## 2023-10-06 DIAGNOSIS — R2681 Unsteadiness on feet: Secondary | ICD-10-CM | POA: Diagnosis not present

## 2023-10-06 DIAGNOSIS — R2689 Other abnormalities of gait and mobility: Secondary | ICD-10-CM | POA: Diagnosis not present

## 2023-10-06 DIAGNOSIS — M6249 Contracture of muscle, multiple sites: Secondary | ICD-10-CM | POA: Diagnosis not present

## 2023-10-06 DIAGNOSIS — M6281 Muscle weakness (generalized): Secondary | ICD-10-CM | POA: Diagnosis not present

## 2023-10-06 NOTE — Therapy (Signed)
OUTPATIENT PHYSICAL THERAPY PROSTHETIC TREATMENT   Patient Name: Adam [name] MRN: [id number] DOB:23-Mar-1951, 72 y.o., male Today's Date: 10/06/2023  PCP: Lorine Bears, MD REFERRING PROVIDER: Lorine Bears, MD  END OF SESSION:  PT End of Session - 10/06/23 1255     Visit Number 4    Number of Visits 7    Date for PT Re-Evaluation 11/23/23    Authorization Type [employer] Medicare HMO/PPO    PT Start Time 1300    PT Stop Time 1413    PT Time Calculation (min) 73 min                Past Medical History:  Diagnosis Date   Gangrene (HCC) 11/22/2015   right great toe   Hypertension    OSA on CPAP    PAD (peripheral artery disease) (HCC)    Peripheral vascular disease (HCC)    Type II diabetes mellitus (HCC) dx'd 11/2015   Past Surgical History:  Procedure Laterality Date   AMPUTATION Right 12/01/2015   Procedure: AMPUTATION RIGHT GREAT;  Surgeon: Fransisco Hertz, MD;  Location: [facility] OR;  Service: Vascular;  Laterality: Right;   AMPUTATION Right 12/16/2015   Procedure: AMPUTATION BELOW KNEE;  Surgeon: Larina Earthly, MD;  Location: [facility] OR;  Service: Vascular;  Laterality: Right;   AMPUTATION Right 12/26/2015   Procedure: RIGHT ABOVE KNEE AMPUTATION;  Surgeon: Larina Earthly, MD;  Location: [facility] OR;  Service: Vascular;  Laterality: Right;   COLONOSCOPY     FEMORAL-POPLITEAL BYPASS GRAFT Right 11/28/2015   Procedure: RIGHT  FEMORAL-POPLITEAL ARTERY BYPASS GRAFT AND CONSTRUCTION OF MILLER CUFF USING 6 MM X 80 CM PROPATEN  GRAFT ;  Surgeon: Fransisco Hertz, MD;  Location: [facility] OR;  Service: Vascular;  Laterality: Right;   INGUINAL HERNIA REPAIR Bilateral 2000   PERIPHERAL VASCULAR CATHETERIZATION N/A 11/26/2015   Procedure: Abdominal Aortogram;  Surgeon: Chuck Hint, MD;  Location: [facility] INVASIVE CV LAB;  Service: Cardiovascular;  Laterality: N/A;   VASECTOMY     Patient Active Problem List   Diagnosis Date Noted   Erectile dysfunction due to arterial insufficiency 07/06/2020   Phantom  pain after amputation of lower extremity (HCC) 01/18/2016   S/P AKA (above knee amputation), right (HCC) 12/29/2015   PAD (peripheral artery disease) (HCC) 12/15/2015   Hyperlipidemia 12/07/2015   Hypertension 12/04/2014    ONSET DATE: 08/04/2023 MD referral to PT  REFERRING DIAG: Z89.611 (ICD-10-CM) - S/P AKA (above knee amputation), right   THERAPY DIAG:  Other abnormalities of gait and mobility  Unsteadiness on feet  Stiffness of right hip, not elsewhere classified  Contracture of muscle, multiple sites  Muscle weakness (generalized)  Rationale for Evaluation and Treatment: Rehabilitation  SUBJECTIVE:   SUBJECTIVE STATEMENT: He has been working on bending his knee. He has 2 of 3 levels on adjust-a-lift in left shoe.  No tripping or catching prosthetic toes noted.    PERTINENT HISTORY: right AKA 12/26/2015, DM2, HLD, HTN, PAD/PVD, phantom pain  PAIN:  Are you having pain? No  PRECAUTIONS: None  WEIGHT BEARING RESTRICTIONS: No  FALLS: Has patient fallen in last 6 months? No  LIVING ENVIRONMENT: Lives with: lives with their spouse and small dog who gets under feet Lives in: House   Home layout: One level Stairs: Yes: External: 14 steps; can reach both He lives at [employer].  He has a boat and he has to walk on ramp for floating dock.    OCCUPATION: retired  PLOF: Independent,  Independent with household mobility without device, and Independent with community mobility without device  PATIENT GOALS:  wants to improve his prosthetic gait.   OBJECTIVE:  COGNITION: Overall cognitive status: 08/25/2023 Eval: Within functional limits for tasks assessed  MUSCLE LENGTH: 08/25/2023 Eval: Hamstrings: hip 90* with knee extended to Left -43 deg Thomas test: Right -22 deg;   POSTURE: 08/25/2023 Eval:  flexed trunk  and weight shift left  LOWER EXTREMITY ROM:  ROM P:passive  A:active Right eval Left eval  Hip flexion    Hip extension    Hip abduction    Hip adduction     Hip internal rotation    Hip external rotation    Knee flexion    Knee extension    Ankle dorsiflexion  -8*  Ankle plantarflexion    Ankle inversion    Ankle eversion     (Blank rows = not tested)  LOWER EXTREMITY MMT:  MMT Right eval Left eval  Hip flexion    Hip extension    Hip abduction 4/5 5/5  Hip adduction    Hip internal rotation    Hip external rotation    Knee flexion    Knee extension    Ankle dorsiflexion    Ankle plantarflexion    Ankle inversion    Ankle eversion    At Evaluation all strength testing is grossly seated and functionally standing / gait. (Blank rows = not tested)  TRANSFERS: 08/25/2023 Eval: Sit to stand: Modified independence requires use of BUEs on armrest to arise but stabilizes without external support Stand to sit: Modified independence using BUEs on armrest   FUNCTIONAL TESTs:  08/25/2023 Eval: Timed up and go (TUG):  standard 17.00 sec with prosthesis only;  cognitive TUG with prosthesis only 20.75 sec naming ingredients for recipe,  22% increase.   All times and % increase indicates high fall risk.  Berg Balance Scale: 44/56 L-test:  37.21 sec  L-test includes sit/stand chair without armrests walking out 32.8 ft and back with one 90* left turn & one 90* right turn.      GAIT: 08/25/2023 Eval: Gait pattern: step through pattern, decreased arm swing- Right, decreased step length- Left, decreased stance time- Right, decreased hip/knee flexion- Right, circumduction- Right, Right hip hike, antalgic, abducted- Right, and poor foot clearance- Right  pt has minimal to no prosthetic knee flexion in terminal stance or swing.  Distance walked: >300' Assistive device utilized:  TFA microprocessor prosthesis and None Level of assistance: SBA  / verbal cues Gait velocity: comfortable / self-selected 1.94 ft/sec and fast pace 2.60 ft/sec Comments: See Functional Gait Assessment above  12/30  STAIRS:  08/25/2023 Eval:   step to pattern with  single rail support.   RAMP 08/25/2023 Eval: SBA / verbal cues due to poor knee control  CURB: 08/25/2023 Eval:  Modified independence  CARDIOVASCULAR RESPONSE: 08/25/2023 Eval: Functional activity: gait & balance assessment Pre-activity vitals: HR: 77 SpO2: 98% Post-activity vitals: HR: 79 SpO2:  96%   CURRENT PROSTHETIC WEAR ASSESSMENT: 08/25/2023:  Patient is independent with: skin check, residual limb care, prosthetic cleaning, ply sock cleaning, correct ply sock adjustment, proper wear schedule/adjustment, and proper weight-bearing schedule/adjustment Donning prosthesis: Modified independence Doffing prosthesis: Modified independence Prosthetic wear tolerance: reports daily for most of awake hours Prosthetic weight bearing tolerance: >10 minutes with no c/o limb pain Residual limb condition: reports no issues. Prosthetic description: ischial containment socket with flexible inner socket, silicon liner with suction ring suspension, Rheo microprocessor knee, Flex dynamic response  foot;    Assessment with proper height (level pelvis standing) and limb seated correctly in socket. K code/activity level with prosthetic use: Level 3 full community distances with variable cadence    TODAY'S TREATMENT:                                                                                                                             DATE:  10/06/2023: Prosthetic Training with Transfemoral Microprocessor Prosthesis: Pt had removed Adjust-a-lift from left shoe this morning unintentionally.  Patient arrived to PT session with significant abduction of prosthesis with minimal to no knee flexion for terminal stance or swing.   PT worked on pre-gait in parallel bars with Ship broker for visual feedback with feet positioned so prosthesis in terminal stance position.  Initially weight shift from over prosthesis to over left foot; added to this weight shift pelvic rotation to unlock the prosthetic knee without  moving the foot.  Patient was struggling with this today so PT attempted alternative method. Standing in same foot position in front of counter with open drawer in front of right hip.  The goal was to close the drawer using the weight shift and pelvic rotation motion.  Again patient had trouble focusing on this the day.  So PT went to supine HEP exercise program to facilitate proper motor control activities.  See below. Stationary stance with prosthetic foot directly under right hip with slight toe out.  PT provided visual, tactile and verbal cues for this position to facilitate weightbearing directly through the prosthesis.  Patient verbalized understanding Patient ambulated with narrow base of support using a line for visual feedback and mirror.  Patient was able to narrow base to 4 inch with with the visual feedback noted but minimal carryover once visual feedback was not present. PT worked on treadmill gait with patient able to verbalize general techniques for safety.  Patient ambulated at 0.5 mph with BUE support on rails.  PT cueing for focus on reaching each foot forward for initial contact position which should facilitate contralateral lower extremity and terminal stance position.  Patient struggled with this today.  Therapeutic exercise: Focus is on trying to get lower pelvic isolated motion. -Bridge with ball squeeze 10 reps -Bridge with ball squeeze with pelvic rotation 10 reps -Bridge with ball squeeze with BUEs right straight up towards ceiling 10 reps -Bridge with ball squeeze with pelvic rotation touching left hand to right ASIS to facilitate counter trunk rotation motion 10 reps -Scooting forward with pelvic motion 10 reps PT added above to HEP with HO, demo, verbal and tactile cues.  Patient verbalized understanding.   09/22/2023: Prosthetic Training with Transfemoral Microprocessor Prosthesis: PT recommended using Adjust-a-lift with 2 of 3 layers in left shoe when he is not  focusing fully on prosthetic knee flexion like when he is doing exercises at sink or focused gait.  This is because with 1/2" change to prosthesis height he may catch the toe &/or his  back hurt as needs slow change.  Plan will be to reduce the Adjust-a-lift at each PT appt (every 2 weeks) until no lift so his legs are same height.  Pt verbalized understanding.  No lift with below activities: PT reviewed recommendation with demo, tactile & verbal cues for pillow or folded towel under left pelvis / thigh when sitting >10 min.  Pt verbalized understanding. Pre-gait in //bars using mirror for visual feedback, tactile, verbal & demo cues. 1-3.prosthesis in terminal stance position - 1)wt shift RLE to LLE & pelvic rotation flexing prosthetic knee rolling over toes.  2)performed 1 then advanced prosthesis including completing pelvic rotation. 3)while performing 2 moving contralateral UE forward to touch target.  4-6 positioned prosthesis in initial contact position - 4)wt shift LLE to RLE so COM is directly over prosthesis with left heel raise/knee flexion.  5)complete 4 then stepping LLE forward with left pelvic rotation  6)added reaching contralateral UE forward to target while stepping (#5).   Progressed pre-gait to walking in //bars "freezing" after each portion (above 1, 2, 4, 5).  Pt amb 75' X 4 with HHA so can slow gait to focus on prosthesis terminal stance knee flexion then advancing prosthesis. Treadmill - PT demo & verbal cues on safety with use.  Pt amb 0. for 2 min with PT manual / tactile cues (PT straddling belt standing behind pt).  Focus on terminal stance prosthetic knee flexion then stepping prosthesis forward.  Pt requires further treadmill training prior to using outside of PT.     09/08/2023: Prosthetic Training with Transfemoral Microprocessor Prosthesis: Pt educated with demo & verbal cues on sitting with level pelvis with pillow under left side.  Also having prosthetist add a  trouser pad to back of socket may help stop socket rotation on limb.  Pt verbalized understanding.  Patient's prosthesis is 1/2" too short.  PT recommended having prosthetist adjust height and using an Adjust-a-Lift in left shoe to compensate slowly for height adjustment.  Pt verbalized understanding.  See HEP below for pre-gait activities at sink for prosthetic control including terminal stance prosthetic knee flexion, advancing with pelvis, weight shift over prosthesis in stance and equal step through pattern. Pt performed 30 reps ea using mirror, demo, verbal & tactile cues.  Sitting down PT demo & verbal cues on engaging prosthetic knee with flexion for descent.  Using target to keep pelvis midline instead of shifting left. Pt performed 20 reps with PT cues.  Stairs descend riding prosthetic hydraulics - PT demo & verbal cues on technique - pt descended 28 steps with 2 rails with cues.   Therapeutic exercise: PT reviewed HEP of stretches: LLE hamstring stretch 30 sec hold 2 reps, Thomas hip flexor stretch 2 reps BLEs 30 reps,  standing LLE gastroc stretch 2 reps 30 sec. Pt required correction cues from PT.    PATIENT EDUCATION:  Education details: HEP, POC Person educated: Patient Education method: Programmer, multimedia, Demonstration, Verbal cues, and Handouts Education comprehension: verbalized understanding, returned demonstration, and verbal cues required  HOME EXERCISE PROGRAM: Access Code: [email or web address] URL: [email or web address] Date: 10/06/2023 Prepared by: Vladimir Faster  Exercises - Seated Hamstring Stretch with Strap  - 1 x daily - 7 x weekly - 1 sets - 3 reps - 30 seconds hold - standing calf stretch with forefoot on small step or brick  - 1 x daily - 7 x weekly - 1 sets - 3 reps - 30 seconds hold - Thomas Stretch on Table  - 1 x daily -  7 x weekly - 1 sets - 3 reps - 30 seconds hold - Supine Bridge with Mini Swiss Ball Between Knees  - 1 x daily - 7 x weekly - 1 sets - 10  reps - 5 seconds hold - Supine Bridge with Pelvic Rotation  - 1 x daily - 7 x weekly - 1 sets - 10 reps - 5 seconds hold - Bridge with Arms Raised  - 1 x daily - 7 x weekly - 1 sets - 10 reps - 5 seconds hold - Seated Scooting Pelvis Forward and Backward  - 1 x daily - 7 x weekly - 1 sets - 10 reps   Stand at [employer] top (on your right).  Keep hips fairly close to counter top (like 2" at most) and 2 chair backs on left hand.    Stand with sound limb forward step. Line in front of prosthesis & place left foot over the line.  Step 1: Start with weight on prosthesis. Weight shift forward to left leg keeping left knee straight without locking it (don't squat!!). Your right "headlight" on your hip should come forward with the weight shift. Prosthetic knee should bend by rolling over the prosthetic toe. Keep prosthetic toe on ground.  Repeat step 1 for 20-30 reps.  Step 2: Bring prosthesis forward by bringing right headlight forward & hip flexion (moving leg straight forward-not out to the side-think don't hit the counter top) "placing heel" on floor. Repeat step 1  (freeze for 2-3 seconds) + step 2 for 20-30 reps.   Step 3: Weight shift forward so pelvis (hips) over prosthesis & left heel lifts up & knee is flexing. Maintain left toe on ground. Repeat step 1 (freeze for 2-3 seconds) + step 2  (freeze for 2-3 seconds) +  step 3 for 20-30 reps  Step 4: Step left foot forward stepping over imaginary line (place heel of your left foot past the toe of the prosthesis). Repeat step 1  (freeze for 2-3 seconds) + step 2  (freeze for 2-3 seconds) + step 3  (freeze for 2-3 seconds) + 4 for 20-30 reps  *Keep an eye on your foot placement and adjust back to starting position as needed.    ASSESSMENT:  CLINICAL IMPRESSION: Patient appears to have increased difficulty with focusing today.  PT altered instructions to facilitate better understanding today.  Patient appears to have a general understanding of  prosthetic gait recommendations for narrow base in stance with narrow base.  He appears to understand HEP instructed today.  Patient still does not appear safe to use treadmill outside of PT at this time.  Pt continues to benefit skilled PT.   OBJECTIVE IMPAIRMENTS: Abnormal gait, decreased activity tolerance, decreased knowledge of use of DME, decreased ROM, decreased strength, postural dysfunction, and prosthetic dependency .   ACTIVITY LIMITATIONS: carrying, lifting, standing, stairs, transfers, and locomotion level  PARTICIPATION LIMITATIONS: community activity, yard work, and recreational activities  PERSONAL FACTORS: Age, Fitness, Past/current experiences, Time since onset of injury/illness/exacerbation, and 3+ comorbidities: see PMH  are also affecting patient's functional outcome.   REHAB POTENTIAL: Good  CLINICAL DECISION MAKING: Evolving/moderate complexity  EVALUATION COMPLEXITY: Moderate   GOALS: Goals reviewed with patient? Yes  SHORT TERM GOALS: Target date: 09/22/2023  Patient demonstrates and verbalizes understanding of Home Exercise Program Baseline: SEE OBJECTIVE DATA Goal status: MET 09/22/2023  LONG TERM GOALS: Target date: 11/23/2023  Sharlene Motts Balance Test >/= 50/56 to indicate lower fall risk Baseline: SEE OBJECTIVE DATA  Goal status: Ongoing 10/06/2023  Timed Up & Go with prosthesis only <13.5 sec and cognitive TUG <15.5sec to indicate lower fall risk.  Baseline: SEE OBJECTIVE DATA Goal status: Ongoing 10/06/2023  Functional Gait Assessment >/= 19/30 to indicate lower fall risk Baseline: SEE OBJECTIVE DATA Goal status:   Ongoing 10/06/2023  Patient ambulates >500' with prosthesis only flexing prosthetic knee >50% of steps independently Baseline: SEE OBJECTIVE DATA Goal status: Ongoing 10/06/2023  Patient negotiates ramps, curbs & stairs with single rail with prosthesis only independently. Baseline: SEE OBJECTIVE DATA Goal status: Ongoing  10/06/2023  Patient verbalizes & demonstrates understanding of ongoing Home Exercise Program. Baseline: SEE OBJECTIVE DATA Goal status: Ongoing 10/06/2023  PLAN:  PT FREQUENCY: 1x/week every 2 weeks  PT DURATION:  90 days  PLANNED INTERVENTIONS: Therapeutic exercises, Therapeutic activity, Neuromuscular re-education, Balance training, Gait training, Patient/Family education, Self Care, Stair training, Vestibular training, Prosthetic training, Re-evaluation, and physical performance testing  PLAN FOR NEXT SESSION: Check updated HEP,  treadmill with focus on prosthetic knee flexion terminal stance, prosthetic gait, stairs using prosthetic hydraulics.    Vladimir Faster, PT, DPT 10/06/2023, 2:25 PM

## 2023-10-20 ENCOUNTER — Encounter: Payer: Medicare HMO | Admitting: Physical Therapy

## 2023-11-04 DIAGNOSIS — H40013 Open angle with borderline findings, low risk, bilateral: Secondary | ICD-10-CM | POA: Diagnosis not present

## 2023-11-04 DIAGNOSIS — H524 Presbyopia: Secondary | ICD-10-CM | POA: Diagnosis not present

## 2023-11-17 ENCOUNTER — Ambulatory Visit: Payer: Medicare HMO | Admitting: Physical Therapy

## 2023-11-17 ENCOUNTER — Encounter: Payer: Self-pay | Admitting: Physical Therapy

## 2023-11-17 DIAGNOSIS — M25651 Stiffness of right hip, not elsewhere classified: Secondary | ICD-10-CM

## 2023-11-17 DIAGNOSIS — M6249 Contracture of muscle, multiple sites: Secondary | ICD-10-CM | POA: Diagnosis not present

## 2023-11-17 DIAGNOSIS — R2681 Unsteadiness on feet: Secondary | ICD-10-CM

## 2023-11-17 DIAGNOSIS — R2689 Other abnormalities of gait and mobility: Secondary | ICD-10-CM

## 2023-11-17 DIAGNOSIS — M6281 Muscle weakness (generalized): Secondary | ICD-10-CM | POA: Diagnosis not present
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
## 2023-12-08 ENCOUNTER — Encounter: Payer: Medicare HMO | Admitting: Physical Therapy

## 2023-12-28 ENCOUNTER — Encounter: Payer: Self-pay | Admitting: Physical Therapy

## 2023-12-28 ENCOUNTER — Ambulatory Visit: Payer: Medicare HMO | Admitting: Physical Therapy

## 2023-12-28 DIAGNOSIS — R2681 Unsteadiness on feet: Secondary | ICD-10-CM | POA: Diagnosis not present

## 2023-12-28 DIAGNOSIS — M6249 Contracture of muscle, multiple sites: Secondary | ICD-10-CM | POA: Diagnosis not present

## 2023-12-28 DIAGNOSIS — M6281 Muscle weakness (generalized): Secondary | ICD-10-CM

## 2023-12-28 DIAGNOSIS — R2689 Other abnormalities of gait and mobility: Secondary | ICD-10-CM

## 2023-12-28 DIAGNOSIS — M25651 Stiffness of right hip, not elsewhere classified: Secondary | ICD-10-CM | POA: Diagnosis not present

## 2023-12-28 NOTE — Therapy (Signed)
 OUTPATIENT PHYSICAL THERAPY PROSTHETIC TREATMENT   Patient Name: Adam [name] MRN: [id number] DOB:1951/04/03, 73 y.o., male Today's Date: 12/28/2023  PCP: Israel Marina, MD REFERRING PROVIDER: Israel Marina, MD  END OF SESSION:  PT End of Session - 12/28/23 1257     Visit Number 6    Number of Visits 9    Date for PT Re-Evaluation 02/15/24    Authorization Type [employer] Medicare HMO/PPO    PT Start Time 1257    PT Stop Time 1425    PT Time Calculation (min) 88 min    Activity Tolerance Patient tolerated treatment well    Behavior During Therapy [facility] for tasks assessed/performed;Impulsive                Past Medical History:  Diagnosis Date   Gangrene (HCC) 11/22/2015   right great toe   Hypertension    OSA on CPAP    PAD (peripheral artery disease) (HCC)    Peripheral vascular disease (HCC)    Type II diabetes mellitus (HCC) dx'd 11/2015   Past Surgical History:  Procedure Laterality Date   AMPUTATION Right 12/01/2015   Procedure: AMPUTATION RIGHT GREAT;  Surgeon: Arvil Lauber, MD;  Location: [facility] OR;  Service: Vascular;  Laterality: Right;   AMPUTATION Right 12/16/2015   Procedure: AMPUTATION BELOW KNEE;  Surgeon: Mayo Speck, MD;  Location: [facility] OR;  Service: Vascular;  Laterality: Right;   AMPUTATION Right 12/26/2015   Procedure: RIGHT ABOVE KNEE AMPUTATION;  Surgeon: Mayo Speck, MD;  Location: [facility] OR;  Service: Vascular;  Laterality: Right;   COLONOSCOPY     FEMORAL-POPLITEAL BYPASS GRAFT Right 11/28/2015   Procedure: RIGHT  FEMORAL-POPLITEAL ARTERY BYPASS GRAFT AND CONSTRUCTION OF MILLER CUFF USING 6 MM X 80 CM PROPATEN  GRAFT ;  Surgeon: Arvil Lauber, MD;  Location: [facility] OR;  Service: Vascular;  Laterality: Right;   INGUINAL HERNIA REPAIR Bilateral 2000   PERIPHERAL VASCULAR CATHETERIZATION N/A 11/26/2015   Procedure: Abdominal Aortogram;  Surgeon: Dannis Dy, MD;  Location: [facility] INVASIVE CV LAB;  Service: Cardiovascular;  Laterality: N/A;   VASECTOMY      Patient Active Problem List   Diagnosis Date Noted   Erectile dysfunction due to arterial insufficiency 07/06/2020   Phantom pain after amputation of lower extremity (HCC) 01/18/2016   S/P AKA (above knee amputation), right (HCC) 12/29/2015   PAD (peripheral artery disease) (HCC) 12/15/2015   Hyperlipidemia 12/07/2015   Hypertension 12/04/2014    ONSET DATE: 08/04/2023 MD referral to PT  REFERRING DIAG: Z89.611 (ICD-10-CM) - S/P AKA (above knee amputation), right   THERAPY DIAG:  Other abnormalities of gait and mobility  Unsteadiness on feet  Stiffness of right hip, not elsewhere classified  Contracture of muscle, multiple sites  Muscle weakness (generalized)  Rationale for Evaluation and Treatment: Rehabilitation  SUBJECTIVE:   SUBJECTIVE STATEMENT:   He fell ~1/14. He was getting out of car and prosthetic socket was so loose that the prosthesis fell off.  He fell on his right elbow with soreness in shoulder.  It is better now.  He has not seen a doctor about shoulder.  He sees prosthetist today after PT.    PERTINENT HISTORY: right AKA 12/26/2015, DM2, HLD, HTN, PAD/PVD, phantom pain  PAIN:  Are you having pain? No  PRECAUTIONS: None  WEIGHT BEARING RESTRICTIONS: No  FALLS: Has patient fallen in last 6 months? No  LIVING ENVIRONMENT: Lives with: lives with their spouse and small dog who gets  under feet Lives in: House   Home layout: One level Stairs: Yes: External: 14 steps; can reach both He lives at [employer].  He has a boat and he has to walk on ramp for floating dock.    OCCUPATION: retired  PLOF: Independent, Independent with household mobility without device, and Independent with community mobility without device  PATIENT GOALS:  wants to improve his prosthetic gait.   OBJECTIVE:  COGNITION: Overall cognitive status: 08/25/2023 Eval: Within functional limits for tasks assessed  MUSCLE LENGTH: 08/25/2023 Eval: Hamstrings: hip 90* with knee extended to  Left -43 deg Thomas test: Right -22 deg;   POSTURE: 08/25/2023 Eval:  flexed trunk  and weight shift left  LOWER EXTREMITY ROM:  ROM P:passive  A:active Right eval Left eval  Hip flexion    Hip extension    Hip abduction    Hip adduction    Hip internal rotation    Hip external rotation    Knee flexion    Knee extension    Ankle dorsiflexion  -8*  Ankle plantarflexion    Ankle inversion    Ankle eversion     (Blank rows = not tested)  LOWER EXTREMITY MMT:  MMT Right eval Left eval  Hip flexion    Hip extension    Hip abduction 4/5 5/5  Hip adduction    Hip internal rotation    Hip external rotation    Knee flexion    Knee extension    Ankle dorsiflexion    Ankle plantarflexion    Ankle inversion    Ankle eversion    At Evaluation all strength testing is grossly seated and functionally standing / gait. (Blank rows = not tested)  TRANSFERS: 08/25/2023 Eval: Sit to stand: Modified independence requires use of BUEs on armrest to arise but stabilizes without external support Stand to sit: Modified independence using BUEs on armrest   FUNCTIONAL TESTs:  11/17/2023 Eval: Timed up and go (TUG):  standard 19.63 sec with prosthesis only;  cognitive TUG with prosthesis only 23.44 sec naming ingredients for recipe,  19% increase.   All times and % increase indicates high fall risk.  Berg Balance Scale: 47/56    08/25/2023 Eval: Timed up and go (TUG):  standard 17.00 sec with prosthesis only;  cognitive TUG with prosthesis only 20.75 sec naming ingredients for recipe,  22% increase.   All times and % increase indicates high fall risk.  Berg Balance Scale: 44/56 L-test:  37.21 sec  L-test includes sit/stand chair without armrests walking out 32.8 ft and back with one 90* left turn & one 90* right turn.    GAIT: 11/17/2023:  Gait velocity: comfortable / self-selected 2.01 ft/sec and fast pace 2.63 ft/sec Comments: See Functional Gait Assessment above   12/30  08/25/2023 Eval: Gait pattern: step through pattern, decreased arm swing- Right, decreased step length- Left, decreased stance time- Right, decreased hip/knee flexion- Right, circumduction- Right, Right hip hike, antalgic, abducted- Right, and poor foot clearance- Right  pt has minimal to no prosthetic knee flexion in terminal stance or swing.  Distance walked: >300' Assistive device utilized:  TFA microprocessor prosthesis and None Level of assistance: SBA  / verbal cues Gait velocity: comfortable / self-selected 1.94 ft/sec and fast pace 2.60 ft/sec Comments: See Functional Gait Assessment above  12/30  STAIRS:  08/25/2023 Eval:   step to pattern with single rail support.   RAMP 08/25/2023 Eval: SBA / verbal cues due to poor knee control  CURB: 08/25/2023 Eval:  Modified independence  CARDIOVASCULAR RESPONSE: 08/25/2023 Eval: Functional activity: gait & balance assessment Pre-activity vitals: HR: 77 SpO2: 98% Post-activity vitals: HR: 79 SpO2:  96%   CURRENT PROSTHETIC WEAR ASSESSMENT: 08/25/2023:  Patient is independent with: skin check, residual limb care, prosthetic cleaning, ply sock cleaning, correct ply sock adjustment, proper wear schedule/adjustment, and proper weight-bearing schedule/adjustment Donning prosthesis: Modified independence Doffing prosthesis: Modified independence Prosthetic wear tolerance: reports daily for most of awake hours Prosthetic weight bearing tolerance: >10 minutes with no c/o limb pain Residual limb condition: reports no issues. Prosthetic description: ischial containment socket with flexible inner socket, silicon liner with suction ring suspension, Rheo microprocessor knee, Flex dynamic response foot;    Assessment with proper height (level pelvis standing) and limb seated correctly in socket. K code/activity level with prosthetic use: Level 3 full community distances with variable cadence    TODAY'S TREATMENT:                                                                                                                              DATE:  12/28/2023: Prosthetic Training with Transfemoral Microprocessor Prosthesis: PT demo & verbal cues on sitting with lower portion of prosthesis rotated with rotator unit.  This positioning in sitting can decrease weight of prosthesis causing prosthesis to slide off.  With driving the lower portion will need to be positioned on console and in regular chair can position on other thigh.  Pt verbalized understanding.  PT demo & verbal cues on engaging prosthesis with sit to/from stand with proper technique.  Progressed from 2 hands, to 1 hand to no hands for support.  PT made video with pt's phone of him performing with PT directions.  Pt performed total >50 reps working on his form.  Treadmill with prosthetic foot on belt and LLE on side board.  Working on initial contact with heel to terminal stance with prosthesis behind him & initiating knee flexion.  1.0 mph for 2 min and 1 min.  The prosthesis lost suction so had to stop.   Pt amb along counter with heavy focus on upright trunk, step through pattern and terminal stance prosthetic knee flexion.       TREATMENT:                                                                                                                             DATE:  11/17/2023: Prosthetic Training with  Transfemoral Microprocessor Prosthesis: PT demo & verbal cues on sit to stand and stand to sit engaging prosthesis. Worked on technique from 18" chair without armrests UEs on seat.  PT used pt's phone to record patient standing & sitting technique.  PT recommended watching video and practicing with counter / table to his right, chair back anteriorly for safety and chair without armrests behind.  Pt verbalized understanding.  Pt sit to/from stand without armrests to 24" bar stool stabilized against wall.  Pt performed 10 reps with PT cues.  PT recommended trying from armrests of  couch which would be about same height. Pt verbalized understanding.   Pt descend stairs using 2 rails with step-to pattern "riding" hydraulics with PT cues on technique.  PT explained that this activity is to get him to trust bending prosthesis with weight on it like terminal stance, etc.  Pt verbalized understanding.   Physical Performance Testing: See objective data    TREATMENT:                                                                                                                             DATE:  10/06/2023: Prosthetic Training with Transfemoral Microprocessor Prosthesis: Pt had removed Adjust-a-lift from left shoe this morning unintentionally.  Patient arrived to PT session with significant abduction of prosthesis with minimal to no knee flexion for terminal stance or swing.   PT worked on pre-gait in parallel bars with Ship broker for visual feedback with feet positioned so prosthesis in terminal stance position.  Initially weight shift from over prosthesis to over left foot; added to this weight shift pelvic rotation to unlock the prosthetic knee without moving the foot.  Patient was struggling with this today so PT attempted alternative method. Standing in same foot position in front of counter with open drawer in front of right hip.  The goal was to close the drawer using the weight shift and pelvic rotation motion.  Again patient had trouble focusing on this the day.  So PT went to supine HEP exercise program to facilitate proper motor control activities.  See below. Stationary stance with prosthetic foot directly under right hip with slight toe out.  PT provided visual, tactile and verbal cues for this position to facilitate weightbearing directly through the prosthesis.  Patient verbalized understanding Patient ambulated with narrow base of support using a line for visual feedback and mirror.  Patient was able to narrow base to 4 inch with with the visual feedback noted but minimal  carryover once visual feedback was not present. PT worked on treadmill gait with patient able to verbalize general techniques for safety.  Patient ambulated at 0.5 mph with BUE support on rails.  PT cueing for focus on reaching each foot forward for initial contact position which should facilitate contralateral lower extremity and terminal stance position.  Patient struggled with this today.  Therapeutic exercise: Focus is on trying to get lower pelvic isolated motion. -Bridge with ball  squeeze 10 reps -Bridge with ball squeeze with pelvic rotation 10 reps -Bridge with ball squeeze with BUEs right straight up towards ceiling 10 reps -Bridge with ball squeeze with pelvic rotation touching left hand to right ASIS to facilitate counter trunk rotation motion 10 reps -Scooting forward with pelvic motion 10 reps PT added above to HEP with HO, demo, verbal and tactile cues.  Patient verbalized understanding.   PATIENT EDUCATION:  Education details: HEP, POC Person educated: Patient Education method: Programmer, multimedia, Demonstration, Verbal cues, and Handouts Education comprehension: verbalized understanding, returned demonstration, and verbal cues required  HOME EXERCISE PROGRAM: Access Code: [email or web address] URL: [email or web address] Date: 10/06/2023 Prepared by: Lorie Rook  Exercises - Seated Hamstring Stretch with Strap  - 1 x daily - 7 x weekly - 1 sets - 3 reps - 30 seconds hold - standing calf stretch with forefoot on small step or brick  - 1 x daily - 7 x weekly - 1 sets - 3 reps - 30 seconds hold - Thomas Stretch on Table  - 1 x daily - 7 x weekly - 1 sets - 3 reps - 30 seconds hold - Supine Bridge with Mini Swiss Ball Between Knees  - 1 x daily - 7 x weekly - 1 sets - 10 reps - 5 seconds hold - Supine Bridge with Pelvic Rotation  - 1 x daily - 7 x weekly - 1 sets - 10 reps - 5 seconds hold - Bridge with Arms Raised  - 1 x daily - 7 x weekly - 1 sets - 10 reps - 5 seconds hold -  Seated Scooting Pelvis Forward and Backward  - 1 x daily - 7 x weekly - 1 sets - 10 reps   Stand at [employer] top (on your right).  Keep hips fairly close to counter top (like 2" at most) and 2 chair backs on left hand.    Stand with sound limb forward step. Line in front of prosthesis & place left foot over the line.  Step 1: Start with weight on prosthesis. Weight shift forward to left leg keeping left knee straight without locking it (don't squat!!). Your right "headlight" on your hip should come forward with the weight shift. Prosthetic knee should bend by rolling over the prosthetic toe. Keep prosthetic toe on ground.  Repeat step 1 for 20-30 reps.  Step 2: Bring prosthesis forward by bringing right headlight forward & hip flexion (moving leg straight forward-not out to the side-think don't hit the counter top) "placing heel" on floor. Repeat step 1  (freeze for 2-3 seconds) + step 2 for 20-30 reps.   Step 3: Weight shift forward so pelvis (hips) over prosthesis & left heel lifts up & knee is flexing. Maintain left toe on ground. Repeat step 1 (freeze for 2-3 seconds) + step 2  (freeze for 2-3 seconds) +  step 3 for 20-30 reps  Step 4: Step left foot forward stepping over imaginary line (place heel of your left foot past the toe of the prosthesis). Repeat step 1  (freeze for 2-3 seconds) + step 2  (freeze for 2-3 seconds) + step 3  (freeze for 2-3 seconds) + 4 for 20-30 reps  *Keep an eye on your foot placement and adjust back to starting position as needed.    ASSESSMENT:  CLINICAL IMPRESSION: Patient needs a lot of repetitions to learn new method or form of activities.  He improved stand to / from sit engaging prosthesis with lots of reps.  Pt continues to have issues with flexing the prosthetic knee in terminal stance.  Pt continues to benefit skilled PT.   OBJECTIVE IMPAIRMENTS: Abnormal gait, decreased activity tolerance, decreased knowledge of use of DME, decreased ROM,  decreased strength, postural dysfunction, and prosthetic dependency .   ACTIVITY LIMITATIONS: carrying, lifting, standing, stairs, transfers, and locomotion level  PARTICIPATION LIMITATIONS: community activity, yard work, and recreational activities  PERSONAL FACTORS: Age, Fitness, Past/current experiences, Time since onset of injury/illness/exacerbation, and 3+ comorbidities: see PMH  are also affecting patient's functional outcome.   REHAB POTENTIAL: Good  CLINICAL DECISION MAKING: Evolving/moderate complexity  EVALUATION COMPLEXITY: Moderate   GOALS: Goals reviewed with patient? Yes  SHORT TERM GOALS: Target date: 09/22/2023  Patient demonstrates and verbalizes understanding of Home Exercise Program Baseline: SEE OBJECTIVE DATA Goal status: MET 09/22/2023  LONG TERM GOALS: Target date: 3/30//2025  Berg Balance Test >/= 50/56 to indicate lower fall risk Baseline: SEE OBJECTIVE DATA  Goal status: Ongoing 12/28/2023  Timed Up & Go with prosthesis only <13.5 sec and cognitive TUG <15.5sec to indicate lower fall risk.  Baseline: SEE OBJECTIVE DATA Goal status: Ongoing 12/28/2023  Functional Gait Assessment >/= 19/30 to indicate lower fall risk Baseline: SEE OBJECTIVE DATA Goal status:   Ongoing 12/28/2023  Patient ambulates >500' with prosthesis only flexing prosthetic knee >50% of steps independently Baseline: SEE OBJECTIVE DATA Goal status:  Ongoing 12/28/2023  Patient negotiates ramps, curbs & stairs with single rail with prosthesis only independently. Baseline: Ongoing 12/28/2023  Patient verbalizes & demonstrates understanding of ongoing Home Exercise Program. Baseline: SEE OBJECTIVE DATA Goal status: Ongoing 12/28/2023  PLAN:  PT FREQUENCY: 1x/week every 2-3 weeks  PT DURATION:  90 days  PLANNED INTERVENTIONS: Therapeutic exercises, Therapeutic activity, Neuromuscular re-education, Balance training, Gait training, Patient/Family education, Self Care, Stair training,  Vestibular training, Prosthetic training, Re-evaluation, and physical performance testing  PLAN FOR NEXT SESSION:  check on sit to / from stand technique and stairs, work on prosthetic gait including scanning, ramp/curb.     Sevin Langenbach, PT, DPT 12/28/2023, 2:35 PM

## 2024-01-12 ENCOUNTER — Encounter: Payer: Self-pay | Admitting: Internal Medicine

## 2024-01-18 ENCOUNTER — Ambulatory Visit: Payer: Medicare HMO | Admitting: Physical Therapy

## 2024-01-18 ENCOUNTER — Encounter: Payer: Self-pay | Admitting: Physical Therapy

## 2024-01-18 DIAGNOSIS — R2689 Other abnormalities of gait and mobility: Secondary | ICD-10-CM

## 2024-01-18 DIAGNOSIS — M25651 Stiffness of right hip, not elsewhere classified: Secondary | ICD-10-CM | POA: Diagnosis not present

## 2024-01-18 DIAGNOSIS — R2681 Unsteadiness on feet: Secondary | ICD-10-CM | POA: Diagnosis not present

## 2024-01-18 DIAGNOSIS — M6249 Contracture of muscle, multiple sites: Secondary | ICD-10-CM

## 2024-01-18 DIAGNOSIS — M6281 Muscle weakness (generalized): Secondary | ICD-10-CM | POA: Diagnosis not present

## 2024-01-18 NOTE — Therapy (Signed)
 OUTPATIENT PHYSICAL THERAPY PROSTHETIC TREATMENT   Patient Name: Adam [name] MRN: [id number] DOB:1951-03-20, 73 y.o., male Today's Date: 01/18/2024  PCP: Lorine Bears, MD REFERRING PROVIDER: Lorine Bears, MD  END OF SESSION:  PT End of Session - 01/18/24 1302     Visit Number 7    Number of Visits 9    Date for PT Re-Evaluation 02/15/24    Authorization Type [employer] Medicare HMO/PPO    PT Start Time 1300    PT Stop Time 1413    PT Time Calculation (min) 73 min    Activity Tolerance Patient tolerated treatment well    Behavior During Therapy [facility] for tasks assessed/performed;Impulsive                 Past Medical History:  Diagnosis Date   Gangrene (HCC) 11/22/2015   right great toe   Hypertension    OSA on CPAP    PAD (peripheral artery disease) (HCC)    Peripheral vascular disease (HCC)    Type II diabetes mellitus (HCC) dx'd 11/2015   Past Surgical History:  Procedure Laterality Date   AMPUTATION Right 12/01/2015   Procedure: AMPUTATION RIGHT GREAT;  Surgeon: Fransisco Hertz, MD;  Location: [facility] OR;  Service: Vascular;  Laterality: Right;   AMPUTATION Right 12/16/2015   Procedure: AMPUTATION BELOW KNEE;  Surgeon: Larina Earthly, MD;  Location: [facility] OR;  Service: Vascular;  Laterality: Right;   AMPUTATION Right 12/26/2015   Procedure: RIGHT ABOVE KNEE AMPUTATION;  Surgeon: Larina Earthly, MD;  Location: [facility] OR;  Service: Vascular;  Laterality: Right;   COLONOSCOPY     FEMORAL-POPLITEAL BYPASS GRAFT Right 11/28/2015   Procedure: RIGHT  FEMORAL-POPLITEAL ARTERY BYPASS GRAFT AND CONSTRUCTION OF MILLER CUFF USING 6 MM X 80 CM PROPATEN  GRAFT ;  Surgeon: Fransisco Hertz, MD;  Location: [facility] OR;  Service: Vascular;  Laterality: Right;   INGUINAL HERNIA REPAIR Bilateral 2000   PERIPHERAL VASCULAR CATHETERIZATION N/A 11/26/2015   Procedure: Abdominal Aortogram;  Surgeon: Chuck Hint, MD;  Location: [facility] INVASIVE CV LAB;  Service: Cardiovascular;  Laterality: N/A;   VASECTOMY      Patient Active Problem List   Diagnosis Date Noted   Erectile dysfunction due to arterial insufficiency 07/06/2020   Phantom pain after amputation of lower extremity (HCC) 01/18/2016   S/P AKA (above knee amputation), right (HCC) 12/29/2015   PAD (peripheral artery disease) (HCC) 12/15/2015   Hyperlipidemia 12/07/2015   Hypertension 12/04/2014    ONSET DATE: 08/04/2023 MD referral to PT  REFERRING DIAG: Z89.611 (ICD-10-CM) - S/P AKA (above knee amputation), right   THERAPY DIAG:  Other abnormalities of gait and mobility  Unsteadiness on feet  Stiffness of right hip, not elsewhere classified  Contracture of muscle, multiple sites  Muscle weakness (generalized)  Rationale for Evaluation and Treatment: Rehabilitation  SUBJECTIVE:   SUBJECTIVE STATEMENT:   He wore his old microprocessor knee prosthesis Saturday & Sunday.  Patient feels that this new microprocessor knee is not programmed correctly.  Both MPK prostheses are [employer] Rheo but he feels the programming is different.  No more falls.   PERTINENT HISTORY: right AKA 12/26/2015, DM2, HLD, HTN, PAD/PVD, phantom pain  PAIN:  Are you having pain? No  PRECAUTIONS: None  WEIGHT BEARING RESTRICTIONS: No  FALLS: Has patient fallen in last 6 months? No  LIVING ENVIRONMENT: Lives with: lives with their spouse and small dog who gets under feet Lives in: House   Home layout: One level Stairs:  Yes: External: 14 steps; can reach both [place]  He has a boat and he has to walk on ramp for floating dock.    OCCUPATION: retired  PLOF: Independent, Independent with household mobility without device, and Independent with community mobility without device  PATIENT GOALS:  wants to improve his prosthetic gait.   OBJECTIVE:  COGNITION: Overall cognitive status: 08/25/2023 Eval: Within functional limits for tasks assessed  MUSCLE LENGTH: 08/25/2023 Eval: Hamstrings: hip 90* with knee extended to Left -43 deg Thomas  test: Right -22 deg;   POSTURE: 08/25/2023 Eval:  flexed trunk  and weight shift left  LOWER EXTREMITY ROM:  ROM P:passive  A:active Right eval Left eval  Hip flexion    Hip extension    Hip abduction    Hip adduction    Hip internal rotation    Hip external rotation    Knee flexion    Knee extension    Ankle dorsiflexion  -8*  Ankle plantarflexion    Ankle inversion    Ankle eversion     (Blank rows = not tested)  LOWER EXTREMITY MMT:  MMT Right eval Left eval  Hip flexion    Hip extension    Hip abduction 4/5 5/5  Hip adduction    Hip internal rotation    Hip external rotation    Knee flexion    Knee extension    Ankle dorsiflexion    Ankle plantarflexion    Ankle inversion    Ankle eversion    At Evaluation all strength testing is grossly seated and functionally standing / gait. (Blank rows = not tested)  TRANSFERS: 08/25/2023 Eval: Sit to stand: Modified independence requires use of BUEs on armrest to arise but stabilizes without external support Stand to sit: Modified independence using BUEs on armrest   FUNCTIONAL TESTs:  11/17/2023 Eval: Timed up and go (TUG):  standard 19.63 sec with prosthesis only;  cognitive TUG with prosthesis only 23.44 sec naming ingredients for recipe,  19% increase.   All times and % increase indicates high fall risk.  Berg Balance Scale: 47/56    08/25/2023 Eval: Timed up and go (TUG):  standard 17.00 sec with prosthesis only;  cognitive TUG with prosthesis only 20.75 sec naming ingredients for recipe,  22% increase.   All times and % increase indicates high fall risk.  Berg Balance Scale: 44/56 L-test:  37.21 sec  L-test includes sit/stand chair without armrests walking out 32.8 ft and back with one 90* left turn & one 90* right turn.    GAIT: 11/17/2023:  Gait velocity: comfortable / self-selected 2.01 ft/sec and fast pace 2.63 ft/sec Comments: See Functional Gait Assessment above  12/30  08/25/2023 Eval: Gait  pattern: step through pattern, decreased arm swing- Right, decreased step length- Left, decreased stance time- Right, decreased hip/knee flexion- Right, circumduction- Right, Right hip hike, antalgic, abducted- Right, and poor foot clearance- Right  pt has minimal to no prosthetic knee flexion in terminal stance or swing.  Distance walked: >300' Assistive device utilized:  TFA microprocessor prosthesis and None Level of assistance: SBA  / verbal cues Gait velocity: comfortable / self-selected 1.94 ft/sec and fast pace 2.60 ft/sec Comments: See Functional Gait Assessment above  12/30  STAIRS:  08/25/2023 Eval:   step to pattern with single rail support.   RAMP 08/25/2023 Eval: SBA / verbal cues due to poor knee control  CURB: 08/25/2023 Eval:  Modified independence  CARDIOVASCULAR RESPONSE: 08/25/2023 Eval: Functional activity: gait & balance  assessment Pre-activity vitals: HR: 77 SpO2: 98% Post-activity vitals: HR: 79 SpO2:  96%   CURRENT PROSTHETIC WEAR ASSESSMENT: 08/25/2023:  Patient is independent with: skin check, residual limb care, prosthetic cleaning, ply sock cleaning, correct ply sock adjustment, proper wear schedule/adjustment, and proper weight-bearing schedule/adjustment Donning prosthesis: Modified independence Doffing prosthesis: Modified independence Prosthetic wear tolerance: reports daily for most of awake hours Prosthetic weight bearing tolerance: >10 minutes with no c/o limb pain Residual limb condition: reports no issues. Prosthetic description: ischial containment socket with flexible inner socket, silicon liner with suction ring suspension, Rheo microprocessor knee, Flex dynamic response foot;    Assessment with proper height (level pelvis standing) and limb seated correctly in socket. K code/activity level with prosthetic use: Level 3 full community distances with variable cadence    TODAY'S TREATMENT:                                                                                                                              DATE:  01/18/2024: Prosthetic Training with Transfemoral Microprocessor Prosthesis: Pt is convinced that new microprocessor knee is the problem for bending his knee during gait.  PT educated patient on his movement patterns that are large component of him not wanting to flex the prosthetic knee. Worked in parallel bars with bilateral upper extremity support then right hand support progressing to no upper extremity support.  PT cues and mirror for visual feedback for step width and step through pattern especially sound when passing the prosthetic toe.  PT educated patient that it is imperative that the left foot has a step through pattern in order to get the prosthesis in a terminal stance position loading the toe to initiate flexion.  PT also used cones to narrow the path for walking to keep from abducting. Leg press 75 pounds BLE's working on engaging prosthetic knee for concentric and eccentric motion.  5 minutes. Using Theraband placed equal distance apart where one foot would position between each piece of Thera-Band.  Using this visual cue to work on a step through gait pattern and verbal cues on narrow stance. Patient able to pick up 15 items from the floor flexing the prosthetic knee during the motion with contact-guard assist. Stairs descending "riding" prosthetic hydraulics 28 steps with 2 rails.  Patient improved prosthetic knee control for stance flexion. Worked on ambulating outdoors on paved surfaces using a painted line on the ground to work on step width. patient was able to correct bringing the prosthesis into the line of progression but within abduct the sound limb.  Patient "prosthetic toe 3 times when walking on paved surfaces but was able to self-correct by having the prosthetic hydraulics slowed the buckling motion.   TREATMENT:  DATE:  12/28/2023: Prosthetic Training with Transfemoral Microprocessor Prosthesis: PT demo & verbal cues on sitting with lower portion of prosthesis rotated with rotator unit.  This positioning in sitting can decrease weight of prosthesis causing prosthesis to slide off.  With driving the lower portion will need to be positioned on console and in regular chair can position on other thigh.  Pt verbalized understanding.  PT demo & verbal cues on engaging prosthesis with sit to/from stand with proper technique.  Progressed from 2 hands, to 1 hand to no hands for support.  PT made video with pt's phone of him performing with PT directions.  Pt performed total >50 reps working on his form.  Treadmill with prosthetic foot on belt and LLE on side board.  Working on initial contact with heel to terminal stance with prosthesis behind him & initiating knee flexion.  1.0 mph for 2 min and 1 min.  The prosthesis lost suction so had to stop.   Pt amb along counter with heavy focus on upright trunk, step through pattern and terminal stance prosthetic knee flexion.       TREATMENT:                                                                                                                             DATE:  11/17/2023: Prosthetic Training with Transfemoral Microprocessor Prosthesis: PT demo & verbal cues on sit to stand and stand to sit engaging prosthesis. Worked on technique from 18" chair without armrests UEs on seat.  PT used pt's phone to record patient standing & sitting technique.  PT recommended watching video and practicing with counter / table to his right, chair back anteriorly for safety and chair without armrests behind.  Pt verbalized understanding.  Pt sit to/from stand without armrests to 24" bar stool stabilized against wall.  Pt performed 10 reps with PT cues.  PT recommended trying from armrests of couch which would be about same height. Pt verbalized understanding.    Pt descend stairs using 2 rails with step-to pattern "riding" hydraulics with PT cues on technique.  PT explained that this activity is to get him to trust bending prosthesis with weight on it like terminal stance, etc.  Pt verbalized understanding.   Physical Performance Testing: See objective data    PATIENT EDUCATION:  Education details: HEP, POC Person educated: Patient Education method: Explanation, Demonstration, Verbal cues, and Handouts Education comprehension: verbalized understanding, returned demonstration, and verbal cues required  HOME EXERCISE PROGRAM: Access Code: [email or web address] URL: [email or web address] Date: 10/06/2023 Prepared by: Vladimir Faster  Exercises - Seated Hamstring Stretch with Strap  - 1 x daily - 7 x weekly - 1 sets - 3 reps - 30 seconds hold - standing calf stretch with forefoot on small step or brick  - 1 x daily - 7 x weekly - 1 sets - 3 reps - 30 seconds hold - Thomas Stretch on Table  - 1 x daily - 7  x weekly - 1 sets - 3 reps - 30 seconds hold - Supine Bridge with Mini Swiss Ball Between Knees  - 1 x daily - 7 x weekly - 1 sets - 10 reps - 5 seconds hold - Supine Bridge with Pelvic Rotation  - 1 x daily - 7 x weekly - 1 sets - 10 reps - 5 seconds hold - Bridge with Arms Raised  - 1 x daily - 7 x weekly - 1 sets - 10 reps - 5 seconds hold - Seated Scooting Pelvis Forward and Backward  - 1 x daily - 7 x weekly - 1 sets - 10 reps   Stand at [employer] top (on your right).  Keep hips fairly close to counter top (like 2" at most) and 2 chair backs on left hand.    Stand with sound limb forward step. Line in front of prosthesis & place left foot over the line.  Step 1: Start with weight on prosthesis. Weight shift forward to left leg keeping left knee straight without locking it (don't squat!!). Your right "headlight" on your hip should come forward with the weight shift. Prosthetic knee should bend by rolling over the prosthetic toe. Keep  prosthetic toe on ground.  Repeat step 1 for 20-30 reps.  Step 2: Bring prosthesis forward by bringing right headlight forward & hip flexion (moving leg straight forward-not out to the side-think don't hit the counter top) "placing heel" on floor. Repeat step 1  (freeze for 2-3 seconds) + step 2 for 20-30 reps.   Step 3: Weight shift forward so pelvis (hips) over prosthesis & left heel lifts up & knee is flexing. Maintain left toe on ground. Repeat step 1 (freeze for 2-3 seconds) + step 2  (freeze for 2-3 seconds) +  step 3 for 20-30 reps  Step 4: Step left foot forward stepping over imaginary line (place heel of your left foot past the toe of the prosthesis). Repeat step 1  (freeze for 2-3 seconds) + step 2  (freeze for 2-3 seconds) + step 3  (freeze for 2-3 seconds) + 4 for 20-30 reps  *Keep an eye on your foot placement and adjust back to starting position as needed.    ASSESSMENT:  CLINICAL IMPRESSION: Patient continues to struggle with knee flexion and terminal stance.  PT session focused on working on step through gait pattern and proper step width which is slightly improved with PT cueing.  Pt continues to benefit skilled PT.   OBJECTIVE IMPAIRMENTS: Abnormal gait, decreased activity tolerance, decreased knowledge of use of DME, decreased ROM, decreased strength, postural dysfunction, and prosthetic dependency .   ACTIVITY LIMITATIONS: carrying, lifting, standing, stairs, transfers, and locomotion level  PARTICIPATION LIMITATIONS: community activity, yard work, and recreational activities  PERSONAL FACTORS: Age, Fitness, Past/current experiences, Time since onset of injury/illness/exacerbation, and 3+ comorbidities: see PMH  are also affecting patient's functional outcome.   REHAB POTENTIAL: Good  CLINICAL DECISION MAKING: Evolving/moderate complexity  EVALUATION COMPLEXITY: Moderate   GOALS: Goals reviewed with patient? Yes  SHORT TERM GOALS: Target date: 09/22/2023  Patient  demonstrates and verbalizes understanding of Home Exercise Program Baseline: SEE OBJECTIVE DATA Goal status: MET 09/22/2023  LONG TERM GOALS: Target date: 3/30//2025  Berg Balance Test >/= 50/56 to indicate lower fall risk Baseline: SEE OBJECTIVE DATA  Goal status: Ongoing  01/18/2024  Timed Up & Go with prosthesis only <13.5 sec and cognitive TUG <15.5sec to indicate lower fall risk.  Baseline: SEE OBJECTIVE DATA Goal  status: Ongoing  01/18/2024  Functional Gait Assessment >/= 19/30 to indicate lower fall risk Baseline: SEE OBJECTIVE DATA Goal status:   Ongoing  01/18/2024  Patient ambulates >500' with prosthesis only flexing prosthetic knee >50% of steps independently Baseline: SEE OBJECTIVE DATA Goal status:  Ongoing 01/18/2024  Patient negotiates ramps, curbs & stairs with single rail with prosthesis only independently. Baseline: Ongoing 01/18/2024  Patient verbalizes & demonstrates understanding of ongoing Home Exercise Program. Baseline: SEE OBJECTIVE DATA Goal status: Ongoing 01/18/2024  PLAN:  PT FREQUENCY: 1x/week every 2-3 weeks  PT DURATION:  90 days  PLANNED INTERVENTIONS: Therapeutic exercises, Therapeutic activity, Neuromuscular re-education, Balance training, Gait training, Patient/Family education, Self Care, Stair training, Vestibular training, Prosthetic training, Re-evaluation, and physical performance testing  PLAN FOR NEXT SESSION: Work towards LTG's work on prosthetic gait including scanning, ramp/curb.     Vladimir Faster, PT, DPT 01/18/2024, 2:28 PM

## 2024-01-22 DIAGNOSIS — S46011A Strain of muscle(s) and tendon(s) of the rotator cuff of right shoulder, initial encounter: Secondary | ICD-10-CM | POA: Diagnosis not present

## 2024-02-03 DIAGNOSIS — H401131 Primary open-angle glaucoma, bilateral, mild stage: Secondary | ICD-10-CM | POA: Diagnosis not present

## 2024-02-15 ENCOUNTER — Ambulatory Visit: Payer: Medicare HMO | Admitting: Physical Therapy

## 2024-02-15 ENCOUNTER — Encounter: Payer: Self-pay | Admitting: Physical Therapy

## 2024-02-15 DIAGNOSIS — M6281 Muscle weakness (generalized): Secondary | ICD-10-CM | POA: Diagnosis not present

## 2024-02-15 DIAGNOSIS — R2681 Unsteadiness on feet: Secondary | ICD-10-CM

## 2024-02-15 DIAGNOSIS — M25651 Stiffness of right hip, not elsewhere classified: Secondary | ICD-10-CM

## 2024-02-15 DIAGNOSIS — M6249 Contracture of muscle, multiple sites: Secondary | ICD-10-CM

## 2024-02-15 DIAGNOSIS — R2689 Other abnormalities of gait and mobility: Secondary | ICD-10-CM

## 2024-02-15 NOTE — Therapy (Signed)
 OUTPATIENT PHYSICAL THERAPY PROSTHETIC TREATMENT & DISCHARGE SUMMARY   Patient Name: Adam [name] MRN: [id number] DOB:1951/07/09, 73 y.o., male Today's Date: 02/15/2024  PCP: Sharlot Gowda, MD REFERRING PROVIDER: Sharlot Gowda, MD  PHYSICAL THERAPY DISCHARGE SUMMARY  Visits from Start of Care: 8  Current functional level related to goals / functional outcomes: See below   Remaining deficits: See below   Education / Equipment: Patient was educated in HEP which he appears to understand.    Patient agrees to discharge. Patient goals were partially met. Patient is being discharged due to the patient's request.   END OF SESSION:  PT End of Session - 02/15/24 1300     Visit Number 8    Number of Visits 9    Date for PT Re-Evaluation 02/15/24    Authorization Type [employer] Medicare HMO/PPO    PT Start Time 1300    PT Stop Time 1329    PT Time Calculation (min) 29 min    Activity Tolerance Patient tolerated treatment well    Behavior During Therapy WFL for tasks assessed/performed;Impulsive                  Past Medical History:  Diagnosis Date   Gangrene (HCC) 11/22/2015   right great toe   Hypertension    OSA on CPAP    PAD (peripheral artery disease) (HCC)    Peripheral vascular disease (HCC)    Type II diabetes mellitus (HCC) dx'd 11/2015   Past Surgical History:  Procedure Laterality Date   AMPUTATION Right 12/01/2015   Procedure: AMPUTATION RIGHT GREAT;  Surgeon: Fransisco Hertz, MD;  Location[facility] OR;  Service: Vascular;  Laterality: Right;   AMPUTATION Right 12/16/2015   Procedure: AMPUTATION BELOW KNEE;  Surgeon: Larina Earthly, MD;  Location: [facility] OR;  Service: Vascular;  Laterality: Right;   AMPUTATION Right 12/26/2015   Procedure: RIGHT ABOVE KNEE AMPUTATION;  Surgeon: Larina Earthly, MD;  Location: [facility] OR;  Service: Vascular;  Laterality: Right;   COLONOSCOPY     FEMORAL-POPLITEAL BYPASS GRAFT Right 11/28/2015   Procedure: RIGHT  FEMORAL-POPLITEAL ARTERY BYPASS  GRAFT AND CONSTRUCTION OF MILLER CUFF USING 6 MM X 80 CM PROPATEN  GRAFT ;  Surgeon: Fransisco Hertz, MD;  Location: [facility] OR;  Service: Vascular;  Laterality: Right;   INGUINAL HERNIA REPAIR Bilateral 2000   PERIPHERAL VASCULAR CATHETERIZATION N/A 11/26/2015   Procedure: Abdominal Aortogram;  Surgeon: Chuck Hint, MD;  Location: [facility] INVASIVE CV LAB;  Service: Cardiovascular;  Laterality: N/A;   VASECTOMY     Patient Active Problem List   Diagnosis Date Noted   Erectile dysfunction due to arterial insufficiency 07/06/2020   Phantom pain after amputation of lower extremity (HCC) 01/18/2016   S/P AKA (above knee amputation), right (HCC) 12/29/2015   PAD (peripheral artery disease) (HCC) 12/15/2015   Hyperlipidemia 12/07/2015   Hypertension 12/04/2014    ONSET DATE: 08/04/2023 MD referral to PT  REFERRING DIAG: Z89.611 (ICD-10-CM) - S/P AKA (above knee amputation), right   THERAPY DIAG:  Other abnormalities of gait and mobility  Unsteadiness on feet  Stiffness of right hip, not elsewhere classified  Contracture of muscle, multiple sites  Muscle weakness (generalized)  Rationale for Evaluation and Treatment: Rehabilitation  SUBJECTIVE:   SUBJECTIVE STATEMENT:   He wore new MPK for ~10 days after last PT session and now using old MPK for last 4 days. He fell when new MPK buckled and he hit his head with laceration above  right eyebrow.  He saw orthopedist emergency clinic in [place] with diagnosis of rotator cuff tear RUE from previous fall.  It does not require surgery. He feels that the prosthesis does not work right and is why he can't walk.  He [place] [facility] [place]    PERTINENT HISTORY: right AKA 12/26/2015, DM2, HLD, HTN, PAD/PVD, phantom pain  PAIN:  Are you having pain? No  PRECAUTIONS: None  WEIGHT BEARING RESTRICTIONS: No  FALLS: Has patient fallen in last 6 months? No  LIVING ENVIRONMENT: Lives with:  lives with their spouse and small dog who gets under feet Lives in: House   Home layout: One level Stairs: Yes: External: 14 steps; can reach both He lives at [employer].  He has a boat and he has to walk on ramp for floating dock.    OCCUPATION: retired  PLOF: Independent, Independent with household mobility without device, and Independent with community mobility without device  PATIENT GOALS:  wants to improve his prosthetic gait.   OBJECTIVE:  COGNITION: Overall cognitive status: 08/25/2023 Eval: Within functional limits for tasks assessed  MUSCLE LENGTH: 08/25/2023 Eval: Hamstrings: hip 90* with knee extended to Left -43 deg Thomas test: Right -22 deg;   POSTURE: 08/25/2023 Eval:  flexed trunk  and weight shift left  LOWER EXTREMITY ROM:  ROM P:passive  A:active Right eval Left eval  Hip flexion    Hip extension    Hip abduction    Hip adduction    Hip internal rotation    Hip external rotation    Knee flexion    Knee extension    Ankle dorsiflexion  -8*  Ankle plantarflexion    Ankle inversion    Ankle eversion     (Blank rows = not tested)  LOWER EXTREMITY MMT:  MMT Right eval Left eval  Hip flexion    Hip extension    Hip abduction 4/5 5/5  Hip adduction    Hip internal rotation    Hip external rotation    Knee flexion    Knee extension    Ankle dorsiflexion    Ankle plantarflexion    Ankle inversion    Ankle eversion    At Evaluation all strength testing is grossly seated and functionally standing / gait. (Blank rows = not tested)  TRANSFERS: 08/25/2023 Eval: Sit to stand: Modified independence requires use of BUEs on armrest to arise but stabilizes without external support Stand to sit: Modified independence using BUEs on armrest   FUNCTIONAL TESTs:  02/15/2024: Timed up and go (TUG):  standard 15.35 sec with prosthesis only;  cognitive TUG with prosthesis only 17.22 sec naming ingredients for recipe,  17% increase.   All times and % increase  indicates high fall risk.  Berg Balance Scale: 47/56  Comprehensive Outpatient [facility] PT Assessment - 02/15/24 1300       Standardized Balance Assessment   Standardized Balance Assessment Berg Balance Test;Timed Up and Go Test      Berg Balance Test   Sit to Stand Able to stand  independently using hands    Standing Unsupported Able to stand safely 2 minutes    Sitting with Back Unsupported but Feet Supported on Floor or Stool Able to sit safely and securely 2 minutes    Stand to Sit Sits safely with minimal use of hands    Transfers Able to transfer safely, minor use of hands    Standing Unsupported with Eyes  Closed Able to stand 10 seconds safely    Standing Unsupported with Feet Together Able to place feet together independently and stand 1 minute safely    From Standing, Reach Forward with Outstretched Arm Can reach confidently >25 cm (10")    From Standing Position, Pick up Object from Floor Able to pick up shoe safely and easily    From Standing Position, Turn to Look Behind Over each Shoulder Looks behind from both sides and weight shifts well    Turn 360 Degrees Able to turn 360 degrees safely but slowly    Standing Unsupported, Alternately Place Feet on Step/Stool Able to complete >2 steps/needs minimal assist    Standing Unsupported, One Foot in Front Able to plae foot ahead of the other independently and hold 30 seconds    Standing on One Leg Able to lift leg independently and hold equal to or more than 3 seconds    Total Score 47    Berg comment: Group Information    BERG  < 36 high risk for falls (close to 100%) 46-51 moderate (>50%)   37-45 significant (>80%) 52-55 lower (> 25%)      Timed Up and Go Test   Normal TUG (seconds) 18.81   18.81 sec with prosthesis only   Cognitive TUG (seconds) 22.19   prosthesis only naming ingredients for recipe,  17% increase from std TUG     Functional Gait  Assessment   Gait assessed  Yes    Gait Level Surface Walks 20 ft, slow speed, abnormal gait pattern,  evidence for imbalance or deviates 10-15 in outside of the 12 in walkway width. Requires more than 7 sec to ambulate 20 ft.    Change in Gait Speed Makes only minor adjustments to walking speed, or accomplishes a change in speed with significant gait deviations, deviates 10-15 in outside the 12 in walkway width, or changes speed but loses balance but is able to recover and continue walking.    Gait with Horizontal Head Turns Performs head turns smoothly with slight change in gait velocity (eg, minor disruption to smooth gait path), deviates 6-10 in outside 12 in walkway width, or uses an assistive device.    Gait with Vertical Head Turns Performs task with slight change in gait velocity (eg, minor disruption to smooth gait path), deviates 6 - 10 in outside 12 in walkway width or uses assistive device    Gait and Pivot Turn Pivot turns safely in greater than 3 sec and stops with no loss of balance, or pivot turns safely within 3 sec and stops with mild imbalance, requires small steps to catch balance.    Step Over Obstacle Is able to step over one shoe box (4.5 in total height) but must slow down and adjust steps to clear box safely. May require verbal cueing.    Gait with Narrow Base of Support Ambulates less than 4 steps heel to toe or cannot perform without assistance.    Gait with Eyes Closed Walks 20 ft, slow speed, abnormal gait pattern, evidence for imbalance, deviates 10-15 in outside 12 in walkway width. Requires more than 9 sec to ambulate 20 ft.    Ambulating Backwards Walks 20 ft, slow speed, abnormal gait pattern, evidence for imbalance, deviates 10-15 in outside 12 in walkway width.    Steps Two feet to a stair, must use rail.    Total Score 12    FGA comment: Max score 30  Interpretation: 25-28 = low risk fall  19-24 = medium risk fall  < 19 = high risk fall              11/17/2023  Timed up and go (TUG):  standard 19.63 sec with prosthesis only;  cognitive TUG with prosthesis  only 23.44 sec naming ingredients for recipe,  19% increase.   All times and % increase indicates high fall risk.  Berg Balance Scale: 47/56   08/25/2023 Eval: Timed up and go (TUG):  standard 17.00 sec with prosthesis only;  cognitive TUG with prosthesis only 20.75 sec naming ingredients for recipe,  22% increase.   All times and % increase indicates high fall risk.  Berg Balance Scale: 44/56 L-test:  37.21 sec  L-test includes sit/stand chair without armrests walking out 32.8 ft and back with one 90* left turn & one 90* right turn.    GAIT: 02/15/2024: Gait velocity: comfortable / self-selected 2.40 ft/sec and fast pace 2.49 ft/sec Comments: See Functional Gait Assessment above  12/30  11/17/2023:  Gait velocity: comfortable / self-selected 2.01 ft/sec and fast pace 2.63 ft/sec Comments: See Functional Gait Assessment above  12/30  08/25/2023 Eval: Gait pattern: step through pattern, decreased arm swing- Right, decreased step length- Left, decreased stance time- Right, decreased hip/knee flexion- Right, circumduction- Right, Right hip hike, antalgic, abducted- Right, and poor foot clearance- Right  pt has minimal to no prosthetic knee flexion in terminal stance or swing.  Distance walked: >300' Assistive device utilized:  TFA microprocessor prosthesis and None Level of assistance: SBA  / verbal cues Gait velocity: comfortable / self-selected 1.94 ft/sec and fast pace 2.60 ft/sec Comments: See Functional Gait Assessment above  12/30  STAIRS:   02/15/2024:  pt ascends & descends step-to modified technique with single rail safely.  He does not engage prosthetic knee descending.   08/25/2023 Eval:   step to pattern with single rail support.   RAMP  02/15/2024: Pt neg 12* ramp with prosthesis slow guarded with no prosthetic knee flexion ascending or descending.    08/25/2023 Eval: SBA / verbal cues due to poor knee control  CURB:  02/15/2024: Pt neg 6.5" curb with prosthesis only modified  independent.   08/25/2023 Eval:  Modified independence  CARDIOVASCULAR RESPONSE: 08/25/2023 Eval: Functional activity: gait & balance assessment Pre-activity vitals: HR: 77 SpO2: 98% Post-activity vitals: HR: 79 SpO2:  96%   CURRENT PROSTHETIC WEAR ASSESSMENT: 08/25/2023:  Patient is independent with: skin check, residual limb care, prosthetic cleaning, ply sock cleaning, correct ply sock adjustment, proper wear schedule/adjustment, and proper weight-bearing schedule/adjustment Donning prosthesis: Modified independence Doffing prosthesis: Modified independence Prosthetic wear tolerance: reports daily for most of awake hours Prosthetic weight bearing tolerance: >10 minutes with no c/o limb pain Residual limb condition: reports no issues. Prosthetic description: ischial containment socket with flexible inner socket, silicon liner with suction ring suspension, Rheo microprocessor knee, Flex dynamic response foot;    Assessment with proper height (level pelvis standing) and limb seated correctly in socket. K code/activity level with prosthetic use: Level 3 full community distances with variable cadence    TODAY'S TREATMENT:  DATE:  02/15/2024: Physical Performance Testing with Transfemoral Microprocessor Prosthesis: See objective data.     TREATMENT:                                                                                                                             DATE:  01/18/2024: Prosthetic Training with Transfemoral Microprocessor Prosthesis: Pt is convinced that new microprocessor knee is the problem for bending his knee during gait.  PT educated patient on his movement patterns that are large component of him not wanting to flex the prosthetic knee. Worked in parallel bars with bilateral upper extremity support then right hand support progressing to no  upper extremity support.  PT cues and mirror for visual feedback for step width and step through pattern especially sound when passing the prosthetic toe.  PT educated patient that it is imperative that the left foot has a step through pattern in order to get the prosthesis in a terminal stance position loading the toe to initiate flexion.  PT also used cones to narrow the path for walking to keep from abducting. Leg press 75 pounds BLE's working on engaging prosthetic knee for concentric and eccentric motion.  5 minutes. Using Theraband placed equal distance apart where one foot would position between each piece of Thera-Band.  Using this visual cue to work on a step through gait pattern and verbal cues on narrow stance. Patient able to pick up 15 items from the floor flexing the prosthetic knee during the motion with contact-guard assist. Stairs descending "riding" prosthetic hydraulics 28 steps with 2 rails.  Patient improved prosthetic knee control for stance flexion. Worked on ambulating outdoors on paved surfaces using a painted line on the ground to work on step width. patient was able to correct bringing the prosthesis into the line of progression but within abduct the sound limb.  Patient "prosthetic toe 3 times when walking on paved surfaces but was able to self-correct by having the prosthetic hydraulics slowed the buckling motion.   TREATMENT:                                                                                                                             DATE:  12/28/2023: Prosthetic Training with Transfemoral Microprocessor Prosthesis: PT demo & verbal cues on sitting with lower portion of prosthesis rotated with rotator unit.  This positioning in sitting can decrease weight of prosthesis causing prosthesis to slide  off.  With driving the lower portion will need to be positioned on console and in regular chair can position on other thigh.  Pt verbalized understanding.  PT demo &  verbal cues on engaging prosthesis with sit to/from stand with proper technique.  Progressed from 2 hands, to 1 hand to no hands for support.  PT made video with pt's phone of him performing with PT directions.  Pt performed total >50 reps working on his form.  Treadmill with prosthetic foot on belt and LLE on side board.  Working on initial contact with heel to terminal stance with prosthesis behind him & initiating knee flexion.  1.0 mph for 2 min and 1 min.  The prosthesis lost suction so had to stop.   Pt amb along counter with heavy focus on upright trunk, step through pattern and terminal stance prosthetic knee flexion.        PATIENT EDUCATION:  Education details: HEP, POC Person educated: Patient Education method: Programmer, multimedia, Demonstration, Verbal cues, and Handouts Education comprehension: verbalized understanding, returned demonstration, and verbal cues required  HOME EXERCISE PROGRAM: Access Code: [email or web address] URL: [email or web address] Date: 10/06/2023 Prepared by: Vladimir Faster  Exercises - Seated Hamstring Stretch with Strap  - 1 x daily - 7 x weekly - 1 sets - 3 reps - 30 seconds hold - standing calf stretch with forefoot on small step or brick  - 1 x daily - 7 x weekly - 1 sets - 3 reps - 30 seconds hold - Thomas Stretch on Table  - 1 x daily - 7 x weekly - 1 sets - 3 reps - 30 seconds hold - Supine Bridge with Mini Swiss Ball Between Knees  - 1 x daily - 7 x weekly - 1 sets - 10 reps - 5 seconds hold - Supine Bridge with Pelvic Rotation  - 1 x daily - 7 x weekly - 1 sets - 10 reps - 5 seconds hold - Bridge with Arms Raised  - 1 x daily - 7 x weekly - 1 sets - 10 reps - 5 seconds hold - Seated Scooting Pelvis Forward and Backward  - 1 x daily - 7 x weekly - 1 sets - 10 reps   Stand at [employer] top (on your right).  Keep hips fairly close to counter top (like 2" at most) and 2 chair backs on left hand.    Stand with sound limb forward step. Line in  front of prosthesis & place left foot over the line.  Step 1: Start with weight on prosthesis. Weight shift forward to left leg keeping left knee straight without locking it (don't squat!!). Your right "headlight" on your hip should come forward with the weight shift. Prosthetic knee should bend by rolling over the prosthetic toe. Keep prosthetic toe on ground.  Repeat step 1 for 20-30 reps.  Step 2: Bring prosthesis forward by bringing right headlight forward & hip flexion (moving leg straight forward-not out to the side-think don't hit the counter top) "placing heel" on floor. Repeat step 1  (freeze for 2-3 seconds) + step 2 for 20-30 reps.   Step 3: Weight shift forward so pelvis (hips) over prosthesis & left heel lifts up & knee is flexing. Maintain left toe on ground. Repeat step 1 (freeze for 2-3 seconds) + step 2  (freeze for 2-3 seconds) +  step 3 for 20-30 reps  Step 4: Step left foot forward stepping over imaginary line (place heel of your left foot past the  toe of the prosthesis). Repeat step 1  (freeze for 2-3 seconds) + step 2  (freeze for 2-3 seconds) + step 3  (freeze for 2-3 seconds) + 4 for 20-30 reps  *Keep an eye on your foot placement and adjust back to starting position as needed.    ASSESSMENT:  CLINICAL IMPRESSION: Patient has fallen twice over last few weeks.  He feels that the prosthetic knee is not working correctly so that he does not trust it.  He is actually using his old Microprocessor prosthesis today.  He has not progressed with PT.  PT instructed in multiple ways to work on prosthetic knee flexion with activities. He improves during session but returns with little to no changes.  [place] in prosthetic care.  Without carryover to improve his function, PT does not seem to be making a difference.    OBJECTIVE IMPAIRMENTS: Abnormal gait, decreased activity tolerance, decreased knowledge of use of DME, decreased  ROM, decreased strength, postural dysfunction, and prosthetic dependency .   ACTIVITY LIMITATIONS: carrying, lifting, standing, stairs, transfers, and locomotion level  PARTICIPATION LIMITATIONS: community activity, yard work, and recreational activities  PERSONAL FACTORS: Age, Fitness, Past/current experiences, Time since onset of injury/illness/exacerbation, and 3+ comorbidities: see PMH  are also affecting patient's functional outcome.   REHAB POTENTIAL: Good  CLINICAL DECISION MAKING: Evolving/moderate complexity  EVALUATION COMPLEXITY: Moderate   GOALS: Goals reviewed with patient? Yes  SHORT TERM GOALS: Target date: 09/22/2023  Patient demonstrates and verbalizes understanding of Home Exercise Program Baseline: SEE OBJECTIVE DATA Goal status: MET 09/22/2023  LONG TERM GOALS: Target date: 02/15/2024  Berg Balance Test >/= 50/56 to indicate lower fall risk Baseline: SEE OBJECTIVE DATA  Goal status: NOT MET  02/15/2024  Timed Up & Go with prosthesis only <13.5 sec and cognitive TUG <15.5sec to indicate lower fall risk.  Baseline: SEE OBJECTIVE DATA Goal status: NOT MET    02/15/2024  Functional Gait Assessment >/= 19/30 to indicate lower fall risk Baseline: SEE OBJECTIVE DATA Goal status:   NOT MET   02/15/2024  Patient ambulates >500' with prosthesis only flexing prosthetic knee >50% of steps independently Baseline: SEE OBJECTIVE DATA Goal status:  NOT MET  02/15/2024  Patient negotiates ramps, curbs & stairs with single rail with prosthesis only independently. Baseline: NOT MET   02/15/2024  Patient verbalizes & demonstrates understanding of ongoing Home Exercise Program. Baseline: SEE OBJECTIVE DATA Goal status: MET 02/15/2024  PLAN:  PT FREQUENCY: 1x/week every 2-3 weeks  PT DURATION:  90 days  PLANNED INTERVENTIONS: Therapeutic exercises, Therapeutic activity, Neuromuscular re-education, Balance training, Gait training, Patient/Family education, Self Care, Stair  training, Vestibular training, Prosthetic training, Re-evaluation, and physical performance testing  PLAN FOR NEXT SESSION:  Discharge PT.     Vladimir Faster, PT, DPT 02/15/2024, 1:44 PM

## 2024-02-25 DIAGNOSIS — H2512 Age-related nuclear cataract, left eye: Secondary | ICD-10-CM | POA: Diagnosis not present

## 2024-02-25 DIAGNOSIS — H269 Unspecified cataract: Secondary | ICD-10-CM | POA: Diagnosis not present

## 2024-02-25 DIAGNOSIS — E785 Hyperlipidemia, unspecified: Secondary | ICD-10-CM | POA: Diagnosis not present

## 2024-02-25 DIAGNOSIS — I10 Essential (primary) hypertension: Secondary | ICD-10-CM | POA: Diagnosis not present

## 2024-02-25 DIAGNOSIS — Z87891 Personal history of nicotine dependence: Secondary | ICD-10-CM | POA: Diagnosis not present

## 2024-03-02 ENCOUNTER — Other Ambulatory Visit: Payer: Self-pay | Admitting: Family Medicine

## 2024-03-02 DIAGNOSIS — M7541 Impingement syndrome of right shoulder: Secondary | ICD-10-CM | POA: Diagnosis not present

## 2024-03-11 DIAGNOSIS — F32A Depression, unspecified: Secondary | ICD-10-CM | POA: Diagnosis not present

## 2024-03-11 DIAGNOSIS — Z87891 Personal history of nicotine dependence: Secondary | ICD-10-CM | POA: Diagnosis not present

## 2024-03-11 DIAGNOSIS — E785 Hyperlipidemia, unspecified: Secondary | ICD-10-CM | POA: Diagnosis not present

## 2024-03-11 DIAGNOSIS — H2511 Age-related nuclear cataract, right eye: Secondary | ICD-10-CM | POA: Diagnosis not present

## 2024-03-11 DIAGNOSIS — I10 Essential (primary) hypertension: Secondary | ICD-10-CM | POA: Diagnosis not present

## 2024-03-11 DIAGNOSIS — H269 Unspecified cataract: Secondary | ICD-10-CM | POA: Diagnosis not present

## 2024-04-14 ENCOUNTER — Telehealth: Payer: Self-pay

## 2024-04-14 NOTE — Telephone Encounter (Signed)
 Copied from CRM [id number]. Topic: Clinical - Prescription Issue >> Apr 14, 2024  2:07 PM Carlatta H wrote: Reason for CRM: Patient needs a prescription for Prosthetic leg socket Faxed to [phone] and [facility]//  Got prescription faxed over.

## 2024-04-19 ENCOUNTER — Telehealth: Payer: Self-pay | Admitting: Family Medicine

## 2024-04-19 NOTE — Telephone Encounter (Signed)
 Wife requested sleeves and socks RX for husband.  Sent same to wife and [employer].

## 2024-04-20 ENCOUNTER — Telehealth: Payer: Self-pay | Admitting: Family Medicine

## 2024-04-20 NOTE — Telephone Encounter (Signed)
 Adam [name] (wife) is on HIPAA.  Sent notes to [place] [facility] as request for socks and sleeves for amputation.

## 2024-04-20 NOTE — Telephone Encounter (Signed)
 [facility] need medical notes showing why the patient needs the socks and sleeves for his amputation.  These notes are required by Medicare.  Please advise and I will send over to [facility].

## 2024-05-04 DIAGNOSIS — F411 Generalized anxiety disorder: Secondary | ICD-10-CM | POA: Diagnosis not present

## 2024-05-04 DIAGNOSIS — E782 Mixed hyperlipidemia: Secondary | ICD-10-CM | POA: Diagnosis not present

## 2024-05-04 DIAGNOSIS — I739 Peripheral vascular disease, unspecified: Secondary | ICD-10-CM | POA: Diagnosis not present

## 2024-05-04 DIAGNOSIS — R4189 Other symptoms and signs involving cognitive functions and awareness: Secondary | ICD-10-CM | POA: Diagnosis not present

## 2024-05-04 DIAGNOSIS — S78111A Complete traumatic amputation at level between right hip and knee, initial encounter: Secondary | ICD-10-CM | POA: Diagnosis not present

## 2024-05-04 DIAGNOSIS — I10 Essential (primary) hypertension: Secondary | ICD-10-CM | POA: Diagnosis not present

## 2024-05-04 DIAGNOSIS — Z1330 Encounter for screening examination for mental health and behavioral disorders, unspecified: Secondary | ICD-10-CM | POA: Diagnosis not present

## 2024-05-04 DIAGNOSIS — R7303 Prediabetes: Secondary | ICD-10-CM | POA: Diagnosis not present

## 2024-05-04 DIAGNOSIS — Z87891 Personal history of nicotine dependence: Secondary | ICD-10-CM | POA: Diagnosis not present

## 2024-05-18 ENCOUNTER — Telehealth: Admitting: Family Medicine

## 2024-05-23 DIAGNOSIS — S61411A Laceration without foreign body of right hand, initial encounter: Secondary | ICD-10-CM | POA: Diagnosis not present

## 2024-05-24 ENCOUNTER — Telehealth: Admitting: Family Medicine

## 2024-05-24 ENCOUNTER — Telehealth: Payer: Self-pay | Admitting: Family Medicine

## 2024-05-24 DIAGNOSIS — Z89611 Acquired absence of right leg above knee: Secondary | ICD-10-CM

## 2024-05-24 NOTE — Progress Notes (Signed)
   Subjective:    Patient ID: Adam [name], male    DOB: October 21, 1951, 73 y.o.   MRN: [id number]  HPI Documentation for virtual audio and video telecommunications through [place] encounter:  The patient was located at home. 2 patient identifiers used.  The provider was located in the office. The patient did consent to this visit and is aware of possible charges through their insurance for this visit.  The other persons participating in this telemedicine service were none. Time spent on call was 5 minutes and in review of previous records >20 minutes total for counseling and coordination of care.  This virtual service is not related to other E/M service within previous 7 days.  Today's visit is to discuss his prosthesis.  He has had his prosthesis for several years and within the last year has become much more loose and has caused him to fall several times.  The processes is not functioning normally greatly affecting his ADLs.  Now he is afraid to do much of anything including using his boat for fear of falling.  The most recent injury caused a laceration to his hand.  He has seen a prosthetic company in [place] named [facility] rather than coming back and forth to [place].  I have a letter that I will place in the file concerning all the problems that he is having and the fact that this needs need to be taken care of.  Again the main issue is that he needs a socket replacement as the rest of the metal is certainly in good shape.  I will sign a prescription pad for this as well.  Review of Systems     Objective:    Physical Exam  Alert and in no distress otherwise not examined      Assessment & Plan:  S/P AKA (above knee amputation), right (HCC) I will fax information to [facility] to get him a new socket

## 2024-05-24 NOTE — Telephone Encounter (Signed)
 [employer], [phone] , reached voice mail, requested a call back to let us  know exactly what they need to assist our patient in obtaining a new prosthetic.  I left word to speak with Melissa Hanks as I will be OOO.  Please share the information with Dr. Joyce.

## 2024-05-25 NOTE — Telephone Encounter (Signed)
 Reid from [employer] called re needed information for prosthesis  Per Robynn he needs notes to state that  Patient is experiencing residual limb changes and the ill fitting socket is causing increase risk of falls   Can you add this to the notes so that I can fax them over

## 2024-05-25 NOTE — Telephone Encounter (Signed)
 Spoke to [place] at [employer] and he needs notes from last visit  Faxed notes over to [phone]

## 2024-06-02 DIAGNOSIS — S61214D Laceration without foreign body of right ring finger without damage to nail, subsequent encounter: Secondary | ICD-10-CM | POA: Diagnosis not present

## 2024-06-02 DIAGNOSIS — S61212D Laceration without foreign body of right middle finger without damage to nail, subsequent encounter: Secondary | ICD-10-CM | POA: Diagnosis not present

## 2024-06-07 DIAGNOSIS — Z4802 Encounter for removal of sutures: Secondary | ICD-10-CM | POA: Diagnosis not present

## 2024-06-30 DIAGNOSIS — Z89611 Acquired absence of right leg above knee: Secondary | ICD-10-CM | POA: Diagnosis not present

## 2024-07-08 ENCOUNTER — Telehealth: Payer: Self-pay | Admitting: Family Medicine

## 2024-07-08 NOTE — Telephone Encounter (Signed)
 Copied from CRM [id number]. Topic: Referral - Question >> Jul 07, 2024  3:02 PM Kevelyn M wrote: Reason for CRM: Physical therapy eval and treat. Diagnosis code with description. Signature of the doctor please print next to it.  Fax #[phone] Chris) >> Jul 08, 2024  4:27 PM Antonio DEL wrote: Salomon from [facility] [address] was calling in regards to mutual patient. Stated the patient had called multiple times to confirm if referral had been sent but referral was sent to the wrong clinic, was sent to [facility]. Referral needed to be sent to [facility]. And the referral was also incorrect. Need the referral to say Physical therapy eval and treat and also need it to say diagnosis codes with the description. Also mentioned to make sure the signature of the doctor can also be printed as well. Fax number #[phone] Chris) >> Jul 08, 2024  9:04 AM Eleanor H wrote: Not enough information

## 2024-07-11 ENCOUNTER — Telehealth: Payer: Self-pay

## 2024-07-11 NOTE — Telephone Encounter (Signed)
 Would you like me to put a referral in the system for this or do a written script for PT and fax it them.    Copied from CRM [id number]. Topic: General - Other >> Jul 11, 2024  3:52 PM Deaijah H wrote: Reason for CRM: Danny w/ [facility] for PT referral is missing PT Eval and Treat and diagnoses codes (description with it as well) on there as well. [phone].

## 2024-07-11 NOTE — Telephone Encounter (Signed)
 Copied from CRM [id number]. Topic: General - Other >> Jul 11, 2024  3:52 PM Deaijah H wrote: Reason for CRM: Danny w/ [facility] for PT referral is missing PT Eval and Treat and diagnoses codes (description with it as well) on there as well. [phone].

## 2024-07-11 NOTE — Telephone Encounter (Signed)
 They are asking for a referral

## 2024-07-13 NOTE — Telephone Encounter (Signed)
 Sent updated RX for PT TREAT and eval with DX to pt, [facility] and [facility].

## 2024-07-20 DIAGNOSIS — Z89611 Acquired absence of right leg above knee: Secondary | ICD-10-CM | POA: Diagnosis not present

## 2024-07-20 DIAGNOSIS — R262 Difficulty in walking, not elsewhere classified: Secondary | ICD-10-CM | POA: Diagnosis not present

## 2024-07-20 DIAGNOSIS — G546 Phantom limb syndrome with pain: Secondary | ICD-10-CM | POA: Diagnosis not present

## 2024-07-20 DIAGNOSIS — S78111A Complete traumatic amputation at level between right hip and knee, initial encounter: Secondary | ICD-10-CM | POA: Diagnosis not present

## 2024-07-27 DIAGNOSIS — R262 Difficulty in walking, not elsewhere classified: Secondary | ICD-10-CM | POA: Diagnosis not present

## 2024-07-27 DIAGNOSIS — Z89611 Acquired absence of right leg above knee: Secondary | ICD-10-CM | POA: Diagnosis not present

## 2024-07-27 DIAGNOSIS — S78111A Complete traumatic amputation at level between right hip and knee, initial encounter: Secondary | ICD-10-CM | POA: Diagnosis not present

## 2024-08-05 DIAGNOSIS — G546 Phantom limb syndrome with pain: Secondary | ICD-10-CM | POA: Diagnosis not present

## 2024-08-05 DIAGNOSIS — F411 Generalized anxiety disorder: Secondary | ICD-10-CM | POA: Diagnosis not present

## 2024-08-05 DIAGNOSIS — E782 Mixed hyperlipidemia: Secondary | ICD-10-CM | POA: Diagnosis not present

## 2024-08-05 DIAGNOSIS — R7303 Prediabetes: Secondary | ICD-10-CM | POA: Diagnosis not present

## 2024-08-05 DIAGNOSIS — I739 Peripheral vascular disease, unspecified: Secondary | ICD-10-CM | POA: Diagnosis not present

## 2024-08-05 DIAGNOSIS — Z89611 Acquired absence of right leg above knee: Secondary | ICD-10-CM | POA: Diagnosis not present

## 2024-08-05 DIAGNOSIS — S78111A Complete traumatic amputation at level between right hip and knee, initial encounter: Secondary | ICD-10-CM | POA: Diagnosis not present

## 2024-08-05 DIAGNOSIS — I10 Essential (primary) hypertension: Secondary | ICD-10-CM | POA: Diagnosis not present

## 2024-08-05 DIAGNOSIS — R262 Difficulty in walking, not elsewhere classified: Secondary | ICD-10-CM | POA: Diagnosis not present

## 2024-08-08 DIAGNOSIS — R262 Difficulty in walking, not elsewhere classified: Secondary | ICD-10-CM | POA: Diagnosis not present

## 2024-08-09 DIAGNOSIS — Z87891 Personal history of nicotine dependence: Secondary | ICD-10-CM | POA: Diagnosis not present

## 2024-08-09 DIAGNOSIS — Z0000 Encounter for general adult medical examination without abnormal findings: Secondary | ICD-10-CM | POA: Diagnosis not present

## 2024-08-09 DIAGNOSIS — F411 Generalized anxiety disorder: Secondary | ICD-10-CM | POA: Diagnosis not present

## 2024-08-09 DIAGNOSIS — S78111A Complete traumatic amputation at level between right hip and knee, initial encounter: Secondary | ICD-10-CM | POA: Diagnosis not present

## 2024-08-09 DIAGNOSIS — E782 Mixed hyperlipidemia: Secondary | ICD-10-CM | POA: Diagnosis not present

## 2024-08-09 DIAGNOSIS — Z23 Encounter for immunization: Secondary | ICD-10-CM | POA: Diagnosis not present

## 2024-08-09 DIAGNOSIS — I10 Essential (primary) hypertension: Secondary | ICD-10-CM | POA: Diagnosis not present

## 2024-08-09 DIAGNOSIS — Z89611 Acquired absence of right leg above knee: Secondary | ICD-10-CM | POA: Diagnosis not present

## 2024-08-10 DIAGNOSIS — S78111A Complete traumatic amputation at level between right hip and knee, initial encounter: Secondary | ICD-10-CM | POA: Diagnosis not present

## 2024-08-10 DIAGNOSIS — Z89611 Acquired absence of right leg above knee: Secondary | ICD-10-CM | POA: Diagnosis not present

## 2024-08-10 DIAGNOSIS — G546 Phantom limb syndrome with pain: Secondary | ICD-10-CM | POA: Diagnosis not present

## 2024-08-13 ENCOUNTER — Other Ambulatory Visit: Payer: Self-pay | Admitting: Family Medicine

## 2024-08-13 DIAGNOSIS — I10 Essential (primary) hypertension: Secondary | ICD-10-CM

## 2024-08-16 DIAGNOSIS — S78111A Complete traumatic amputation at level between right hip and knee, initial encounter: Secondary | ICD-10-CM | POA: Diagnosis not present

## 2024-08-16 DIAGNOSIS — G546 Phantom limb syndrome with pain: Secondary | ICD-10-CM | POA: Diagnosis not present

## 2024-08-16 DIAGNOSIS — R262 Difficulty in walking, not elsewhere classified: Secondary | ICD-10-CM | POA: Diagnosis not present

## 2024-08-16 DIAGNOSIS — Z89611 Acquired absence of right leg above knee: Secondary | ICD-10-CM | POA: Diagnosis not present

## 2024-08-18 DIAGNOSIS — G546 Phantom limb syndrome with pain: Secondary | ICD-10-CM | POA: Diagnosis not present

## 2024-08-18 DIAGNOSIS — Z89611 Acquired absence of right leg above knee: Secondary | ICD-10-CM | POA: Diagnosis not present

## 2024-08-18 DIAGNOSIS — R262 Difficulty in walking, not elsewhere classified: Secondary | ICD-10-CM | POA: Diagnosis not present

## 2024-08-18 DIAGNOSIS — S78111D Complete traumatic amputation at level between right hip and knee, subsequent encounter: Secondary | ICD-10-CM | POA: Diagnosis not present

## 2024-08-18 DIAGNOSIS — S78111A Complete traumatic amputation at level between right hip and knee, initial encounter: Secondary | ICD-10-CM | POA: Diagnosis not present

## 2024-08-24 DIAGNOSIS — R262 Difficulty in walking, not elsewhere classified: Secondary | ICD-10-CM | POA: Diagnosis not present

## 2024-08-24 DIAGNOSIS — S78111D Complete traumatic amputation at level between right hip and knee, subsequent encounter: Secondary | ICD-10-CM | POA: Diagnosis not present

## 2024-08-24 DIAGNOSIS — S78111A Complete traumatic amputation at level between right hip and knee, initial encounter: Secondary | ICD-10-CM | POA: Diagnosis not present

## 2024-08-24 DIAGNOSIS — Z89611 Acquired absence of right leg above knee: Secondary | ICD-10-CM | POA: Diagnosis not present

## 2024-08-26 DIAGNOSIS — R262 Difficulty in walking, not elsewhere classified: Secondary | ICD-10-CM | POA: Diagnosis not present

## 2024-08-29 DIAGNOSIS — S78111A Complete traumatic amputation at level between right hip and knee, initial encounter: Secondary | ICD-10-CM | POA: Diagnosis not present

## 2024-08-29 DIAGNOSIS — R262 Difficulty in walking, not elsewhere classified: Secondary | ICD-10-CM | POA: Diagnosis not present

## 2024-08-29 DIAGNOSIS — S78111D Complete traumatic amputation at level between right hip and knee, subsequent encounter: Secondary | ICD-10-CM | POA: Diagnosis not present

## 2024-08-29 DIAGNOSIS — Z89611 Acquired absence of right leg above knee: Secondary | ICD-10-CM | POA: Diagnosis not present

## 2024-08-29 DIAGNOSIS — G546 Phantom limb syndrome with pain: Secondary | ICD-10-CM | POA: Diagnosis not present

## 2024-09-07 DIAGNOSIS — R262 Difficulty in walking, not elsewhere classified: Secondary | ICD-10-CM | POA: Diagnosis not present

## 2024-09-14 ENCOUNTER — Ambulatory Visit: Payer: Medicare HMO | Admitting: Family Medicine

## 2024-09-14 DIAGNOSIS — S78111A Complete traumatic amputation at level between right hip and knee, initial encounter: Secondary | ICD-10-CM | POA: Diagnosis not present

## 2024-09-16 DIAGNOSIS — G546 Phantom limb syndrome with pain: Secondary | ICD-10-CM | POA: Diagnosis not present

## 2024-09-16 DIAGNOSIS — S78111A Complete traumatic amputation at level between right hip and knee, initial encounter: Secondary | ICD-10-CM | POA: Diagnosis not present

## 2024-09-16 DIAGNOSIS — S78111D Complete traumatic amputation at level between right hip and knee, subsequent encounter: Secondary | ICD-10-CM | POA: Diagnosis not present

## 2024-09-16 DIAGNOSIS — R262 Difficulty in walking, not elsewhere classified: Secondary | ICD-10-CM | POA: Diagnosis not present

## 2024-09-16 DIAGNOSIS — Z89611 Acquired absence of right leg above knee: Secondary | ICD-10-CM | POA: Diagnosis not present

## 2024-09-21 DIAGNOSIS — S78111D Complete traumatic amputation at level between right hip and knee, subsequent encounter: Secondary | ICD-10-CM | POA: Diagnosis not present

## 2024-09-21 DIAGNOSIS — R262 Difficulty in walking, not elsewhere classified: Secondary | ICD-10-CM | POA: Diagnosis not present

## 2024-09-21 DIAGNOSIS — G546 Phantom limb syndrome with pain: Secondary | ICD-10-CM | POA: Diagnosis not present

## 2024-09-21 DIAGNOSIS — S78111A Complete traumatic amputation at level between right hip and knee, initial encounter: Secondary | ICD-10-CM | POA: Diagnosis not present

## 2024-09-21 DIAGNOSIS — Z89611 Acquired absence of right leg above knee: Secondary | ICD-10-CM | POA: Diagnosis not present

## 2024-09-22 DIAGNOSIS — L218 Other seborrheic dermatitis: Secondary | ICD-10-CM | POA: Diagnosis not present

## 2024-10-03 DIAGNOSIS — S78111A Complete traumatic amputation at level between right hip and knee, initial encounter: Secondary | ICD-10-CM | POA: Diagnosis not present

## 2024-10-03 DIAGNOSIS — G546 Phantom limb syndrome with pain: Secondary | ICD-10-CM | POA: Diagnosis not present

## 2024-10-03 DIAGNOSIS — Z89611 Acquired absence of right leg above knee: Secondary | ICD-10-CM | POA: Diagnosis not present

## 2024-10-06 DIAGNOSIS — G546 Phantom limb syndrome with pain: Secondary | ICD-10-CM | POA: Diagnosis not present

## 2024-10-06 DIAGNOSIS — S78111A Complete traumatic amputation at level between right hip and knee, initial encounter: Secondary | ICD-10-CM | POA: Diagnosis not present

## 2024-10-06 DIAGNOSIS — Z89611 Acquired absence of right leg above knee: Secondary | ICD-10-CM | POA: Diagnosis not present

## 2024-10-10 DIAGNOSIS — R4189 Other symptoms and signs involving cognitive functions and awareness: Secondary | ICD-10-CM | POA: Diagnosis not present

## 2024-10-12 DIAGNOSIS — G546 Phantom limb syndrome with pain: Secondary | ICD-10-CM | POA: Diagnosis not present

## 2024-10-12 DIAGNOSIS — S78111A Complete traumatic amputation at level between right hip and knee, initial encounter: Secondary | ICD-10-CM | POA: Diagnosis not present

## 2024-10-12 DIAGNOSIS — Z89611 Acquired absence of right leg above knee: Secondary | ICD-10-CM | POA: Diagnosis not present

## 2024-10-28 DIAGNOSIS — R4189 Other symptoms and signs involving cognitive functions and awareness: Secondary | ICD-10-CM | POA: Diagnosis not present

## 2024-11-01 DIAGNOSIS — G4733 Obstructive sleep apnea (adult) (pediatric): Secondary | ICD-10-CM | POA: Diagnosis not present

## 2024-11-04 DIAGNOSIS — Z136 Encounter for screening for cardiovascular disorders: Secondary | ICD-10-CM | POA: Diagnosis not present

## 2024-11-04 DIAGNOSIS — Z87891 Personal history of nicotine dependence: Secondary | ICD-10-CM | POA: Diagnosis not present

## 2024-11-16 ENCOUNTER — Other Ambulatory Visit: Payer: Self-pay | Admitting: Family Medicine

## 2024-11-16 DIAGNOSIS — I10 Essential (primary) hypertension: Secondary | ICD-10-CM

## 2024-11-16 NOTE — Telephone Encounter (Signed)
 Looks like patient now sees another provider at [facility] ([place])
